# Patient Record
Sex: Male | Born: 1935 | Race: White | Hispanic: No | Marital: Married | Smoking: Never smoker
Health system: Southern US, Community
[De-identification: ages and names within clinical notes are randomized; demographics above are authoritative.]

## PROBLEM LIST (undated history)

## (undated) DIAGNOSIS — I480 Paroxysmal atrial fibrillation: Secondary | ICD-10-CM

## (undated) DIAGNOSIS — J42 Unspecified chronic bronchitis: Secondary | ICD-10-CM

## (undated) DIAGNOSIS — I80209 Phlebitis and thrombophlebitis of unspecified deep vessels of unspecified lower extremity: Secondary | ICD-10-CM

## (undated) DIAGNOSIS — I10 Essential (primary) hypertension: Secondary | ICD-10-CM

## (undated) DIAGNOSIS — J189 Pneumonia, unspecified organism: Secondary | ICD-10-CM

## (undated) DIAGNOSIS — I509 Heart failure, unspecified: Secondary | ICD-10-CM

## (undated) DIAGNOSIS — J339 Nasal polyp, unspecified: Secondary | ICD-10-CM

## (undated) DIAGNOSIS — N183 Chronic kidney disease, stage 3 unspecified: Secondary | ICD-10-CM

## (undated) DIAGNOSIS — J449 Chronic obstructive pulmonary disease, unspecified: Secondary | ICD-10-CM

## (undated) DIAGNOSIS — I2510 Atherosclerotic heart disease of native coronary artery without angina pectoris: Secondary | ICD-10-CM

## (undated) DIAGNOSIS — N400 Enlarged prostate without lower urinary tract symptoms: Secondary | ICD-10-CM

## (undated) DIAGNOSIS — E119 Type 2 diabetes mellitus without complications: Secondary | ICD-10-CM

## (undated) DIAGNOSIS — C4441 Basal cell carcinoma of skin of scalp and neck: Secondary | ICD-10-CM

## (undated) DIAGNOSIS — E7800 Pure hypercholesterolemia, unspecified: Secondary | ICD-10-CM

## (undated) DIAGNOSIS — Z950 Presence of cardiac pacemaker: Secondary | ICD-10-CM

## (undated) DIAGNOSIS — I2699 Other pulmonary embolism without acute cor pulmonale: Secondary | ICD-10-CM

## (undated) DIAGNOSIS — I495 Sick sinus syndrome: Secondary | ICD-10-CM

## (undated) HISTORY — PX: INSERT / REPLACE / REMOVE PACEMAKER: SUR710

## (undated) HISTORY — PX: CATARACT EXTRACTION W/ INTRAOCULAR LENS  IMPLANT, BILATERAL: SHX1307

## (undated) HISTORY — PX: BASAL CELL CARCINOMA EXCISION: SHX1214

## (undated) HISTORY — DX: Other pulmonary embolism without acute cor pulmonale: I26.99

## (undated) HISTORY — DX: Sick sinus syndrome: I49.5

## (undated) HISTORY — DX: Essential (primary) hypertension: I10

## (undated) HISTORY — DX: Nasal polyp, unspecified: J33.9

## (undated) HISTORY — DX: Phlebitis and thrombophlebitis of unspecified deep vessels of unspecified lower extremity: I80.209

## (undated) HISTORY — DX: Benign prostatic hyperplasia without lower urinary tract symptoms: N40.0

## (undated) HISTORY — DX: Atherosclerotic heart disease of native coronary artery without angina pectoris: I25.10

## (undated) HISTORY — PX: NASAL POLYP SURGERY: SHX186

---

## 1997-09-01 ENCOUNTER — Emergency Department (HOSPITAL_COMMUNITY): Admission: EM | Admit: 1997-09-01 | Discharge: 1997-09-01 | Payer: Self-pay | Admitting: Emergency Medicine

## 1997-09-03 ENCOUNTER — Inpatient Hospital Stay (HOSPITAL_COMMUNITY): Admission: EM | Admit: 1997-09-03 | Discharge: 1997-09-08 | Payer: Self-pay | Admitting: Internal Medicine

## 1997-10-19 ENCOUNTER — Inpatient Hospital Stay (HOSPITAL_COMMUNITY): Admission: EM | Admit: 1997-10-19 | Discharge: 1997-10-22 | Payer: Self-pay | Admitting: Emergency Medicine

## 2000-01-29 ENCOUNTER — Encounter: Payer: Self-pay | Admitting: Emergency Medicine

## 2000-01-29 ENCOUNTER — Inpatient Hospital Stay (HOSPITAL_COMMUNITY): Admission: EM | Admit: 2000-01-29 | Discharge: 2000-01-31 | Payer: Self-pay | Admitting: Emergency Medicine

## 2000-05-13 ENCOUNTER — Encounter: Payer: Self-pay | Admitting: Emergency Medicine

## 2000-05-13 ENCOUNTER — Inpatient Hospital Stay (HOSPITAL_COMMUNITY): Admission: EM | Admit: 2000-05-13 | Discharge: 2000-05-16 | Payer: Self-pay | Admitting: Emergency Medicine

## 2000-09-19 ENCOUNTER — Emergency Department (HOSPITAL_COMMUNITY): Admission: EM | Admit: 2000-09-19 | Discharge: 2000-09-19 | Payer: Self-pay

## 2001-08-03 ENCOUNTER — Encounter: Payer: Self-pay | Admitting: Emergency Medicine

## 2001-08-03 ENCOUNTER — Inpatient Hospital Stay (HOSPITAL_COMMUNITY): Admission: EM | Admit: 2001-08-03 | Discharge: 2001-08-05 | Payer: Self-pay | Admitting: Emergency Medicine

## 2002-03-06 ENCOUNTER — Emergency Department (HOSPITAL_COMMUNITY): Admission: EM | Admit: 2002-03-06 | Discharge: 2002-03-06 | Payer: Self-pay | Admitting: Emergency Medicine

## 2002-06-28 ENCOUNTER — Emergency Department (HOSPITAL_COMMUNITY): Admission: EM | Admit: 2002-06-28 | Discharge: 2002-06-29 | Payer: Self-pay

## 2002-06-28 ENCOUNTER — Encounter: Payer: Self-pay | Admitting: Emergency Medicine

## 2002-07-28 ENCOUNTER — Emergency Department (HOSPITAL_COMMUNITY): Admission: EM | Admit: 2002-07-28 | Discharge: 2002-07-29 | Payer: Self-pay | Admitting: Emergency Medicine

## 2002-07-28 ENCOUNTER — Encounter: Payer: Self-pay | Admitting: Emergency Medicine

## 2002-12-17 ENCOUNTER — Encounter: Payer: Self-pay | Admitting: Cardiology

## 2002-12-17 ENCOUNTER — Ambulatory Visit (HOSPITAL_COMMUNITY): Admission: RE | Admit: 2002-12-17 | Discharge: 2002-12-18 | Payer: Self-pay | Admitting: Cardiology

## 2003-09-10 ENCOUNTER — Emergency Department (HOSPITAL_COMMUNITY): Admission: EM | Admit: 2003-09-10 | Discharge: 2003-09-10 | Payer: Self-pay | Admitting: Emergency Medicine

## 2003-12-14 DIAGNOSIS — I2699 Other pulmonary embolism without acute cor pulmonale: Secondary | ICD-10-CM

## 2003-12-14 HISTORY — DX: Other pulmonary embolism without acute cor pulmonale: I26.99

## 2003-12-25 ENCOUNTER — Encounter: Admission: RE | Admit: 2003-12-25 | Discharge: 2003-12-25 | Payer: Self-pay | Admitting: *Deleted

## 2003-12-25 ENCOUNTER — Inpatient Hospital Stay (HOSPITAL_COMMUNITY): Admission: AD | Admit: 2003-12-25 | Discharge: 2003-12-30 | Payer: Self-pay | Admitting: *Deleted

## 2004-03-18 ENCOUNTER — Ambulatory Visit: Payer: Self-pay | Admitting: Cardiovascular Disease

## 2004-04-11 ENCOUNTER — Ambulatory Visit: Payer: Self-pay | Admitting: Internal Medicine

## 2004-05-03 ENCOUNTER — Ambulatory Visit: Payer: Self-pay | Admitting: Internal Medicine

## 2004-05-04 ENCOUNTER — Ambulatory Visit: Payer: Self-pay | Admitting: *Deleted

## 2004-05-10 ENCOUNTER — Ambulatory Visit: Payer: Self-pay | Admitting: Cardiology

## 2004-05-19 ENCOUNTER — Ambulatory Visit: Payer: Self-pay | Admitting: Cardiology

## 2004-06-07 ENCOUNTER — Encounter: Admission: RE | Admit: 2004-06-07 | Discharge: 2004-06-07 | Payer: Self-pay | Admitting: Otolaryngology

## 2004-06-17 ENCOUNTER — Ambulatory Visit (HOSPITAL_COMMUNITY): Admission: RE | Admit: 2004-06-17 | Discharge: 2004-06-17 | Payer: Self-pay | Admitting: Otolaryngology

## 2004-06-23 ENCOUNTER — Ambulatory Visit: Payer: Self-pay | Admitting: Cardiology

## 2004-07-21 ENCOUNTER — Ambulatory Visit: Payer: Self-pay | Admitting: *Deleted

## 2004-07-21 ENCOUNTER — Ambulatory Visit: Payer: Self-pay | Admitting: Internal Medicine

## 2004-08-18 ENCOUNTER — Ambulatory Visit: Payer: Self-pay | Admitting: Internal Medicine

## 2004-09-01 ENCOUNTER — Ambulatory Visit: Payer: Self-pay | Admitting: *Deleted

## 2004-09-15 ENCOUNTER — Ambulatory Visit: Payer: Self-pay | Admitting: Internal Medicine

## 2004-10-13 ENCOUNTER — Ambulatory Visit: Payer: Self-pay | Admitting: Internal Medicine

## 2004-11-10 ENCOUNTER — Ambulatory Visit: Payer: Self-pay | Admitting: Cardiology

## 2004-12-07 ENCOUNTER — Ambulatory Visit: Payer: Self-pay | Admitting: *Deleted

## 2004-12-07 ENCOUNTER — Ambulatory Visit: Payer: Self-pay | Admitting: Cardiology

## 2004-12-15 ENCOUNTER — Ambulatory Visit: Payer: Self-pay | Admitting: Internal Medicine

## 2004-12-20 ENCOUNTER — Ambulatory Visit: Payer: Self-pay | Admitting: Cardiology

## 2005-01-03 ENCOUNTER — Ambulatory Visit: Payer: Self-pay | Admitting: Cardiology

## 2005-01-24 ENCOUNTER — Emergency Department (HOSPITAL_COMMUNITY): Admission: EM | Admit: 2005-01-24 | Discharge: 2005-01-24 | Payer: Self-pay | Admitting: Emergency Medicine

## 2005-02-07 ENCOUNTER — Ambulatory Visit: Payer: Self-pay | Admitting: *Deleted

## 2005-03-07 ENCOUNTER — Ambulatory Visit: Payer: Self-pay | Admitting: Internal Medicine

## 2005-04-04 ENCOUNTER — Ambulatory Visit: Payer: Self-pay | Admitting: Cardiology

## 2005-06-13 ENCOUNTER — Ambulatory Visit: Payer: Self-pay | Admitting: Cardiology

## 2005-07-04 ENCOUNTER — Ambulatory Visit: Payer: Self-pay | Admitting: Cardiovascular Disease

## 2005-08-01 ENCOUNTER — Ambulatory Visit: Payer: Self-pay | Admitting: Cardiology

## 2005-08-11 ENCOUNTER — Ambulatory Visit: Payer: Self-pay | Admitting: Internal Medicine

## 2005-08-29 ENCOUNTER — Ambulatory Visit: Payer: Self-pay | Admitting: Cardiology

## 2005-09-12 ENCOUNTER — Ambulatory Visit: Payer: Self-pay | Admitting: Cardiology

## 2005-09-30 ENCOUNTER — Encounter: Admission: RE | Admit: 2005-09-30 | Discharge: 2005-09-30 | Payer: Self-pay

## 2005-10-31 ENCOUNTER — Ambulatory Visit: Payer: Self-pay | Admitting: Cardiology

## 2005-11-08 ENCOUNTER — Ambulatory Visit: Payer: Self-pay | Admitting: *Deleted

## 2005-12-05 ENCOUNTER — Ambulatory Visit: Payer: Self-pay | Admitting: Cardiology

## 2005-12-18 ENCOUNTER — Ambulatory Visit: Payer: Self-pay

## 2005-12-18 ENCOUNTER — Ambulatory Visit: Payer: Self-pay | Admitting: *Deleted

## 2006-01-08 ENCOUNTER — Ambulatory Visit: Payer: Self-pay | Admitting: Cardiology

## 2006-01-31 ENCOUNTER — Inpatient Hospital Stay (HOSPITAL_COMMUNITY): Admission: EM | Admit: 2006-01-31 | Discharge: 2006-02-05 | Payer: Self-pay | Admitting: Emergency Medicine

## 2006-01-31 ENCOUNTER — Encounter: Payer: Self-pay | Admitting: Cardiology

## 2006-01-31 ENCOUNTER — Ambulatory Visit: Payer: Self-pay | Admitting: *Deleted

## 2006-02-12 ENCOUNTER — Ambulatory Visit: Payer: Self-pay | Admitting: *Deleted

## 2006-02-15 ENCOUNTER — Ambulatory Visit: Payer: Self-pay | Admitting: Internal Medicine

## 2006-04-05 ENCOUNTER — Ambulatory Visit: Payer: Self-pay | Admitting: Cardiology

## 2006-04-05 ENCOUNTER — Inpatient Hospital Stay (HOSPITAL_COMMUNITY): Admission: EM | Admit: 2006-04-05 | Discharge: 2006-04-13 | Payer: Self-pay | Admitting: Emergency Medicine

## 2006-04-16 ENCOUNTER — Ambulatory Visit: Payer: Self-pay | Admitting: Internal Medicine

## 2006-04-23 ENCOUNTER — Ambulatory Visit: Payer: Self-pay | Admitting: Internal Medicine

## 2006-04-23 ENCOUNTER — Inpatient Hospital Stay (HOSPITAL_COMMUNITY): Admission: EM | Admit: 2006-04-23 | Discharge: 2006-04-27 | Payer: Self-pay | Admitting: Emergency Medicine

## 2006-05-15 HISTORY — PX: CORONARY ANGIOPLASTY WITH STENT PLACEMENT: SHX49

## 2006-05-17 ENCOUNTER — Ambulatory Visit: Payer: Self-pay | Admitting: Internal Medicine

## 2006-05-21 ENCOUNTER — Ambulatory Visit: Payer: Self-pay

## 2006-06-27 ENCOUNTER — Ambulatory Visit: Payer: Self-pay | Admitting: Internal Medicine

## 2006-07-17 ENCOUNTER — Ambulatory Visit: Payer: Self-pay | Admitting: Internal Medicine

## 2006-08-01 ENCOUNTER — Inpatient Hospital Stay (HOSPITAL_COMMUNITY): Admission: EM | Admit: 2006-08-01 | Discharge: 2006-08-04 | Payer: Self-pay | Admitting: Emergency Medicine

## 2006-08-01 ENCOUNTER — Ambulatory Visit: Payer: Self-pay | Admitting: Cardiology

## 2006-08-02 ENCOUNTER — Encounter: Payer: Self-pay | Admitting: Cardiology

## 2006-08-21 ENCOUNTER — Ambulatory Visit: Payer: Self-pay | Admitting: Internal Medicine

## 2006-08-21 LAB — CONVERTED CEMR LAB
Chloride: 104 meq/L (ref 96–112)
GFR calc non Af Amer: 48 mL/min
Glucose, Bld: 200 mg/dL — ABNORMAL HIGH (ref 70–99)
Sodium: 144 meq/L (ref 135–145)

## 2006-09-24 ENCOUNTER — Ambulatory Visit: Payer: Self-pay | Admitting: Cardiology

## 2006-12-26 ENCOUNTER — Ambulatory Visit: Payer: Self-pay | Admitting: Internal Medicine

## 2007-03-22 DIAGNOSIS — I2510 Atherosclerotic heart disease of native coronary artery without angina pectoris: Secondary | ICD-10-CM | POA: Insufficient documentation

## 2007-03-22 DIAGNOSIS — E1129 Type 2 diabetes mellitus with other diabetic kidney complication: Secondary | ICD-10-CM

## 2007-03-22 DIAGNOSIS — I10 Essential (primary) hypertension: Secondary | ICD-10-CM | POA: Insufficient documentation

## 2007-03-22 DIAGNOSIS — I82409 Acute embolism and thrombosis of unspecified deep veins of unspecified lower extremity: Secondary | ICD-10-CM | POA: Insufficient documentation

## 2007-03-22 DIAGNOSIS — J330 Polyp of nasal cavity: Secondary | ICD-10-CM

## 2007-03-22 DIAGNOSIS — J45909 Unspecified asthma, uncomplicated: Secondary | ICD-10-CM

## 2007-03-22 DIAGNOSIS — J309 Allergic rhinitis, unspecified: Secondary | ICD-10-CM | POA: Insufficient documentation

## 2007-03-28 ENCOUNTER — Ambulatory Visit: Payer: Self-pay | Admitting: Cardiology

## 2007-04-23 ENCOUNTER — Ambulatory Visit: Payer: Self-pay | Admitting: Internal Medicine

## 2007-05-01 ENCOUNTER — Ambulatory Visit: Payer: Self-pay | Admitting: Internal Medicine

## 2007-05-01 LAB — CONVERTED CEMR LAB
Alkaline Phosphatase: 62 units/L (ref 39–117)
TSH: 0.86 microintl units/mL (ref 0.35–5.50)
Total Protein: 7.1 g/dL (ref 6.0–8.3)

## 2007-10-22 ENCOUNTER — Ambulatory Visit: Payer: Self-pay | Admitting: Internal Medicine

## 2007-10-23 ENCOUNTER — Ambulatory Visit: Payer: Self-pay | Admitting: Cardiology

## 2007-11-02 DIAGNOSIS — I2699 Other pulmonary embolism without acute cor pulmonale: Secondary | ICD-10-CM

## 2007-11-21 ENCOUNTER — Ambulatory Visit: Payer: Self-pay | Admitting: Cardiology

## 2007-11-21 LAB — CONVERTED CEMR LAB
ALT: 25 units/L (ref 0–53)
Bilirubin, Direct: 0.1 mg/dL (ref 0.0–0.3)
Total Bilirubin: 0.8 mg/dL (ref 0.3–1.2)

## 2008-03-09 ENCOUNTER — Encounter: Payer: Self-pay | Admitting: Internal Medicine

## 2008-05-01 ENCOUNTER — Ambulatory Visit: Payer: Self-pay | Admitting: Internal Medicine

## 2008-05-18 ENCOUNTER — Encounter: Payer: Self-pay | Admitting: Internal Medicine

## 2008-05-25 ENCOUNTER — Ambulatory Visit: Payer: Self-pay | Admitting: Internal Medicine

## 2008-05-25 DIAGNOSIS — G4700 Insomnia, unspecified: Secondary | ICD-10-CM | POA: Insufficient documentation

## 2008-06-02 ENCOUNTER — Encounter: Payer: Self-pay | Admitting: Internal Medicine

## 2008-06-02 ENCOUNTER — Ambulatory Visit: Admission: RE | Admit: 2008-06-02 | Discharge: 2008-06-02 | Payer: Self-pay | Admitting: Internal Medicine

## 2008-07-02 ENCOUNTER — Ambulatory Visit: Payer: Self-pay | Admitting: Internal Medicine

## 2008-07-22 DIAGNOSIS — R42 Dizziness and giddiness: Secondary | ICD-10-CM

## 2008-07-24 ENCOUNTER — Observation Stay (HOSPITAL_COMMUNITY): Admission: EM | Admit: 2008-07-24 | Discharge: 2008-07-25 | Payer: Self-pay | Admitting: Emergency Medicine

## 2008-07-24 ENCOUNTER — Ambulatory Visit: Payer: Self-pay | Admitting: Surgery

## 2008-07-24 ENCOUNTER — Ambulatory Visit: Payer: Self-pay | Admitting: Internal Medicine

## 2008-07-24 ENCOUNTER — Encounter (INDEPENDENT_AMBULATORY_CARE_PROVIDER_SITE_OTHER): Payer: Self-pay | Admitting: Emergency Medicine

## 2008-07-24 ENCOUNTER — Encounter: Payer: Self-pay | Admitting: Internal Medicine

## 2008-07-24 DIAGNOSIS — N259 Disorder resulting from impaired renal tubular function, unspecified: Secondary | ICD-10-CM | POA: Insufficient documentation

## 2008-09-05 ENCOUNTER — Emergency Department (HOSPITAL_COMMUNITY): Admission: EM | Admit: 2008-09-05 | Discharge: 2008-09-05 | Payer: Self-pay | Admitting: Emergency Medicine

## 2008-09-05 ENCOUNTER — Encounter (INDEPENDENT_AMBULATORY_CARE_PROVIDER_SITE_OTHER): Payer: Self-pay | Admitting: *Deleted

## 2008-10-13 ENCOUNTER — Ambulatory Visit (HOSPITAL_COMMUNITY): Admission: RE | Admit: 2008-10-13 | Discharge: 2008-10-13 | Payer: Self-pay | Admitting: Internal Medicine

## 2008-10-13 ENCOUNTER — Ambulatory Visit: Payer: Self-pay | Admitting: Internal Medicine

## 2008-10-13 DIAGNOSIS — R0609 Other forms of dyspnea: Secondary | ICD-10-CM | POA: Insufficient documentation

## 2008-10-13 DIAGNOSIS — R0989 Other specified symptoms and signs involving the circulatory and respiratory systems: Secondary | ICD-10-CM

## 2008-10-13 DIAGNOSIS — I4891 Unspecified atrial fibrillation: Secondary | ICD-10-CM

## 2008-10-14 ENCOUNTER — Ambulatory Visit: Payer: Self-pay

## 2008-10-14 ENCOUNTER — Encounter: Payer: Self-pay | Admitting: Internal Medicine

## 2008-10-16 ENCOUNTER — Telehealth: Payer: Self-pay | Admitting: Internal Medicine

## 2008-10-19 ENCOUNTER — Ambulatory Visit: Payer: Self-pay | Admitting: Internal Medicine

## 2008-10-21 ENCOUNTER — Telehealth (INDEPENDENT_AMBULATORY_CARE_PROVIDER_SITE_OTHER): Payer: Self-pay | Admitting: *Deleted

## 2008-10-22 ENCOUNTER — Ambulatory Visit: Payer: Self-pay

## 2008-10-22 ENCOUNTER — Encounter: Payer: Self-pay | Admitting: Internal Medicine

## 2008-10-28 ENCOUNTER — Telehealth (INDEPENDENT_AMBULATORY_CARE_PROVIDER_SITE_OTHER): Payer: Self-pay | Admitting: *Deleted

## 2008-11-11 ENCOUNTER — Telehealth: Payer: Self-pay | Admitting: Internal Medicine

## 2008-12-28 ENCOUNTER — Ambulatory Visit: Payer: Self-pay | Admitting: Internal Medicine

## 2008-12-30 ENCOUNTER — Encounter: Payer: Self-pay | Admitting: Internal Medicine

## 2009-03-15 ENCOUNTER — Encounter: Payer: Self-pay | Admitting: Internal Medicine

## 2009-06-08 ENCOUNTER — Inpatient Hospital Stay (HOSPITAL_COMMUNITY): Admission: EM | Admit: 2009-06-08 | Discharge: 2009-06-25 | Payer: Self-pay | Admitting: Emergency Medicine

## 2009-06-08 ENCOUNTER — Ambulatory Visit: Payer: Self-pay | Admitting: Internal Medicine

## 2009-06-11 ENCOUNTER — Ambulatory Visit: Payer: Self-pay | Admitting: Vascular Surgery

## 2009-06-11 ENCOUNTER — Encounter: Payer: Self-pay | Admitting: Internal Medicine

## 2009-06-12 ENCOUNTER — Encounter (INDEPENDENT_AMBULATORY_CARE_PROVIDER_SITE_OTHER): Payer: Self-pay | Admitting: Pulmonary Disease

## 2009-06-14 ENCOUNTER — Encounter: Payer: Self-pay | Admitting: Internal Medicine

## 2009-06-14 ENCOUNTER — Telehealth (INDEPENDENT_AMBULATORY_CARE_PROVIDER_SITE_OTHER): Payer: Self-pay | Admitting: *Deleted

## 2009-06-15 ENCOUNTER — Telehealth: Payer: Self-pay | Admitting: Internal Medicine

## 2009-06-18 ENCOUNTER — Telehealth: Payer: Self-pay | Admitting: Internal Medicine

## 2009-06-22 ENCOUNTER — Telehealth: Payer: Self-pay | Admitting: Internal Medicine

## 2009-06-26 ENCOUNTER — Inpatient Hospital Stay (HOSPITAL_COMMUNITY): Admission: EM | Admit: 2009-06-26 | Discharge: 2009-07-02 | Payer: Self-pay | Admitting: Emergency Medicine

## 2009-07-05 ENCOUNTER — Encounter: Payer: Self-pay | Admitting: Internal Medicine

## 2009-07-05 LAB — CONVERTED CEMR LAB: POC INR: 0.8

## 2009-07-06 ENCOUNTER — Telehealth (INDEPENDENT_AMBULATORY_CARE_PROVIDER_SITE_OTHER): Payer: Self-pay | Admitting: *Deleted

## 2009-07-07 ENCOUNTER — Telehealth: Payer: Self-pay | Admitting: Internal Medicine

## 2009-07-07 ENCOUNTER — Encounter: Payer: Self-pay | Admitting: Internal Medicine

## 2009-07-07 LAB — CONVERTED CEMR LAB: POC INR: 1.2

## 2009-07-09 ENCOUNTER — Encounter: Payer: Self-pay | Admitting: Cardiology

## 2009-07-09 ENCOUNTER — Ambulatory Visit: Payer: Self-pay | Admitting: Internal Medicine

## 2009-07-09 ENCOUNTER — Telehealth: Payer: Self-pay | Admitting: Internal Medicine

## 2009-07-09 DIAGNOSIS — R609 Edema, unspecified: Secondary | ICD-10-CM | POA: Insufficient documentation

## 2009-07-09 LAB — CONVERTED CEMR LAB: POC INR: 1.9

## 2009-07-12 ENCOUNTER — Encounter: Payer: Self-pay | Admitting: Cardiology

## 2009-07-12 LAB — CONVERTED CEMR LAB: POC INR: 2.5

## 2009-07-15 ENCOUNTER — Encounter: Payer: Self-pay | Admitting: Internal Medicine

## 2009-07-16 ENCOUNTER — Encounter: Payer: Self-pay | Admitting: Cardiology

## 2009-07-18 ENCOUNTER — Encounter: Payer: Self-pay | Admitting: Internal Medicine

## 2009-07-20 ENCOUNTER — Encounter: Payer: Self-pay | Admitting: Cardiology

## 2009-07-20 LAB — CONVERTED CEMR LAB: POC INR: 6.2

## 2009-07-23 ENCOUNTER — Encounter: Payer: Self-pay | Admitting: Cardiology

## 2009-07-23 ENCOUNTER — Encounter: Payer: Self-pay | Admitting: Internal Medicine

## 2009-07-23 ENCOUNTER — Encounter (INDEPENDENT_AMBULATORY_CARE_PROVIDER_SITE_OTHER): Payer: Self-pay | Admitting: Cardiology

## 2009-07-23 LAB — CONVERTED CEMR LAB: POC INR: 3.6

## 2009-07-27 ENCOUNTER — Encounter: Payer: Self-pay | Admitting: Cardiology

## 2009-07-27 LAB — CONVERTED CEMR LAB
POC INR: 1.9
Prothrombin Time: 16.9 s

## 2009-07-30 ENCOUNTER — Encounter: Payer: Self-pay | Admitting: Cardiovascular Disease

## 2009-07-30 LAB — CONVERTED CEMR LAB
POC INR: 1.7
Prothrombin Time: 16.1 s

## 2009-08-04 ENCOUNTER — Encounter: Payer: Self-pay | Admitting: Internal Medicine

## 2009-08-04 ENCOUNTER — Telehealth: Payer: Self-pay | Admitting: Internal Medicine

## 2009-08-04 ENCOUNTER — Encounter (INDEPENDENT_AMBULATORY_CARE_PROVIDER_SITE_OTHER): Payer: Self-pay | Admitting: Cardiology

## 2009-08-04 LAB — CONVERTED CEMR LAB: Prothrombin Time: 15.3 s

## 2009-08-06 ENCOUNTER — Encounter: Payer: Self-pay | Admitting: Internal Medicine

## 2009-08-11 ENCOUNTER — Ambulatory Visit: Payer: Self-pay | Admitting: Cardiovascular Disease

## 2009-08-13 ENCOUNTER — Encounter: Payer: Self-pay | Admitting: Internal Medicine

## 2009-08-14 ENCOUNTER — Ambulatory Visit: Payer: Self-pay | Admitting: Internal Medicine

## 2009-08-25 ENCOUNTER — Encounter: Payer: Self-pay | Admitting: Internal Medicine

## 2009-08-25 ENCOUNTER — Ambulatory Visit: Payer: Self-pay | Admitting: Internal Medicine

## 2009-09-17 ENCOUNTER — Ambulatory Visit: Payer: Self-pay | Admitting: Cardiology

## 2009-09-17 LAB — CONVERTED CEMR LAB: POC INR: 2.4

## 2009-10-03 ENCOUNTER — Encounter: Payer: Self-pay | Admitting: Internal Medicine

## 2009-10-03 ENCOUNTER — Emergency Department (HOSPITAL_COMMUNITY): Admission: EM | Admit: 2009-10-03 | Discharge: 2009-10-03 | Payer: Self-pay | Admitting: Emergency Medicine

## 2009-10-04 ENCOUNTER — Telehealth: Payer: Self-pay | Admitting: Internal Medicine

## 2009-10-15 ENCOUNTER — Ambulatory Visit: Payer: Self-pay | Admitting: Cardiovascular Disease

## 2009-10-21 ENCOUNTER — Ambulatory Visit: Payer: Self-pay | Admitting: Internal Medicine

## 2009-11-08 ENCOUNTER — Ambulatory Visit: Payer: Self-pay | Admitting: Cardiology

## 2009-11-08 ENCOUNTER — Ambulatory Visit: Payer: Self-pay | Admitting: Internal Medicine

## 2009-11-29 ENCOUNTER — Ambulatory Visit: Payer: Self-pay | Admitting: Cardiology

## 2009-12-27 ENCOUNTER — Ambulatory Visit: Payer: Self-pay | Admitting: Cardiology

## 2009-12-27 LAB — CONVERTED CEMR LAB: POC INR: 2.1

## 2010-01-12 ENCOUNTER — Encounter: Payer: Self-pay | Admitting: Internal Medicine

## 2010-01-21 ENCOUNTER — Ambulatory Visit: Payer: Self-pay | Admitting: Internal Medicine

## 2010-02-24 ENCOUNTER — Inpatient Hospital Stay (HOSPITAL_COMMUNITY): Admission: EM | Admit: 2010-02-24 | Discharge: 2010-03-01 | Payer: Self-pay | Admitting: Emergency Medicine

## 2010-03-23 ENCOUNTER — Telehealth (INDEPENDENT_AMBULATORY_CARE_PROVIDER_SITE_OTHER): Payer: Self-pay | Admitting: *Deleted

## 2010-03-24 ENCOUNTER — Encounter: Payer: Self-pay | Admitting: Internal Medicine

## 2010-04-05 ENCOUNTER — Ambulatory Visit: Payer: Self-pay | Admitting: Cardiovascular Disease

## 2010-05-03 ENCOUNTER — Ambulatory Visit: Payer: Self-pay | Admitting: Cardiology

## 2010-05-12 ENCOUNTER — Encounter: Payer: Self-pay | Admitting: Internal Medicine

## 2010-05-12 ENCOUNTER — Ambulatory Visit: Payer: Self-pay

## 2010-05-31 ENCOUNTER — Ambulatory Visit: Admit: 2010-05-31 | Payer: Self-pay

## 2010-06-05 ENCOUNTER — Encounter: Payer: Self-pay | Admitting: Internal Medicine

## 2010-06-06 ENCOUNTER — Encounter: Payer: Self-pay | Admitting: Internal Medicine

## 2010-06-10 ENCOUNTER — Ambulatory Visit: Admit: 2010-06-10 | Payer: Self-pay

## 2010-06-12 LAB — CONVERTED CEMR LAB
ALT: 30 units/L (ref 0–53)
Alkaline Phosphatase: 77 units/L (ref 39–117)
BUN: 28 mg/dL — ABNORMAL HIGH (ref 6–23)
Bilirubin, Direct: 0.2 mg/dL (ref 0.0–0.3)
Calcium: 9.3 mg/dL (ref 8.4–10.5)
Chloride: 105 meq/L (ref 96–112)
Creatinine, Ser: 1.8 mg/dL — ABNORMAL HIGH (ref 0.4–1.5)
GFR calc non Af Amer: 38.22 mL/min (ref >60)
Glucose, Bld: 121 mg/dL — ABNORMAL HIGH (ref 70–99)
Total Bilirubin: 0.5 mg/dL (ref 0.3–1.2)

## 2010-06-14 NOTE — Miscellaneous (Signed)
Summary: Plan of Care & Treatment/[facility]  Plan of Care & Treatment/[facility]   Imported By: Sherian Rein 08/19/2009 13:50:38  _____________________________________________________________________  External Attachment:    Type:   Image     Comment:   External Document

## 2010-06-14 NOTE — Progress Notes (Signed)
Summary: appt & med  Phone Note Call from Patient Call back at Home Phone [phone]   Caller: [name] @ [facility] Call For: young Summary of Call: Continuation of message.  Pt would like to know if he can be seen today.  [name] says he would come in today.  Also, whwere did they send the Silenor to?  Call his wife at the home number and speak to her. Initial call taken by: Eugene Gavia,  July 09, 2009 1:21 PM  Follow-up for Phone Call        Per CDY-ok to come in today at 430pm. Rx was not sent; spouse is aware and will have pt in today to see CDY and wait for sending RX after seeing CDY. Reynaldo Minium CMA  July 09, 2009 1:31 PM

## 2010-06-14 NOTE — Progress Notes (Signed)
Summary: SILENOR REFILL  Phone Note From Other Clinic   Caller: [facility] -stephanie [phone] Call For: Quantavius Humm Summary of Call: NEEDS SILENOR 6 MG [facility] IN TO [facility] [address] [facility] [address] Initial call taken by: Lacinda Axon,  August 04, 2009 2:58 PM  Follow-up for Phone Call        according to phone note from 2/23 and 07/09/09 CY ok silenor rx, but then it was never called into pharmacy according to notes, so I sent in rx. Judeth Cornfield is aware. Carron Curie CMA  August 04, 2009 3:05 PM     Prescriptions: SILENOR 6 MG TABS (DOXEPIN HCL) 1 for sleep if needed  #30 x 2   Entered by:   Carron Curie CMA   Authorized by:   [name] MD   Signed by:   Carron Curie CMA on 08/04/2009   Method used:   [facility]        [facility]  [address] [phone]       [address]       [place], [place]  [id number]       Ph: [phone] or [phone]       Fax: [phone]   RxID:   [id number]

## 2010-06-14 NOTE — Medication Information (Signed)
Summary: ccr/jss  Anticoagulant Therapy  Managed by: Bethena Midget, RN, BSN Referring MD: Odessa Fleming PCP: [facility] [address] Supervising MD: Jens Som MD, Arlys John Indication 1: DVT Indication 2: Pulmonary Embolism Lab Used: [facility] [place] Site: [address] INR POC 2.4 INR RANGE 2.0-3.0  Dietary changes: yes       Details: Appetite poor  Health status changes: no    Bleeding/hemorrhagic complications: no    Recent/future hospitalizations: no    Any changes in medication regimen? no    Recent/future dental: no  Any missed doses?: no       Is patient compliant with meds? yes       Allergies: No Known Drug Allergies  Anticoagulation Management History:      The patient is taking warfarin and comes in today for a routine follow up visit.  Positive risk factors for bleeding include an age of 75 years or older and presence of serious comorbidities.  The bleeding index is 'intermediate risk'.  Positive CHADS2 values include History of HTN, Age > 59 years old, and History of Diabetes.  Anticoagulation responsible provider: Jens Som MD, Arlys John.  INR POC: 2.4.  Cuvette Lot#: [id number].  Exp: 10/2010.    Anticoagulation Management Assessment/Plan:      The target INR is 2.0-3.0.  The next INR is due 10/15/2009.  Anticoagulation instructions were given to spouse.  Results were reviewed/authorized by Bethena Midget, RN, BSN.  He was notified by Bethena Midget, RN, BSN.         Prior Anticoagulation Instructions: INR 1.8   Change dose to 2 tablets every day except 1 tablet on Sunday, Tuesday and Thursday   Current Anticoagulation Instructions: INR 2.4 Continue 2  pills everyday except on Tuesdays, Thursdays and Sundays take 1 pill. Recheck in 4 weeks.

## 2010-06-14 NOTE — Miscellaneous (Signed)
Summary: PT orders/[facility]  PT orders/[facility]   Imported By: Sherian Rein 08/19/2009 13:48:57  _____________________________________________________________________  External Attachment:    Type:   Image     Comment:   External Document

## 2010-06-14 NOTE — Progress Notes (Signed)
Summary: [facility] called re: pt's diet in hosp  Phone Note From Other Clinic   Caller: [facility] at [facility] Call For: [name] Summary of Call: nurse named Ta Ta-  states that pt passed swallow test. wants to know if pt can have a regular diet with thin liquids? call ta ta at [phone] Initial call taken by: Tivis Ringer, CNA,  June 18, 2009 12:47 PM  Follow-up for Phone Call        Per CDY-ok for 2000 cal ADA diet heart healthy; D/C TNA but keep picc open with saline. Will deal with med changes tomorrow when he rounds in the morning. Spoke with Ta Ta and she verbally expressed she understood the orders as well as she wrote the orders down while on the phone with me. Reynaldo Minium CMA  June 18, 2009 1:53 PM

## 2010-06-14 NOTE — Medication Information (Signed)
Summary: Coumadin Clinic  Anticoagulant Therapy  Managed by: Shelby Dubin, PharmD, BCPS, CPP Referring MD: Odessa Fleming PCP: [facility] [address] Supervising MD: Antoine Poche MD, Fayrene Fearing Indication 1: DVT Indication 2: Pulmonary Embolism Lab Used: [facility] [place] Site: [address] PT 23.0 INR POC 3.6 INR RANGE 2.0-3.0  Dietary changes: no    Health status changes: no    Bleeding/hemorrhagic complications: no    Recent/future hospitalizations: no    Any changes in medication regimen? no    Recent/future dental: no  Any missed doses?: no       Is patient compliant with meds? yes      Comments: called by  Judeth Cornfield, RN [facility]   Allergies (verified): No Known Drug Allergies  Anticoagulation Management History:      His anticoagulation is being managed by telephone today.  Positive risk factors for bleeding include an age of 62 years or older and presence of serious comorbidities.  The bleeding index is 'intermediate risk'.  Positive CHADS2 values include History of HTN, Age > 34 years old, and History of Diabetes.  Prothrombin time is 23.0.  Anticoagulation responsible provider: Antoine Poche MD, Fayrene Fearing.  INR POC: 3.6.    Anticoagulation Management Assessment/Plan:      The target INR is 2.0-3.0.  The next INR is due 07/27/2009.  Anticoagulation instructions were given to spouse.  Results were reviewed/authorized by Shelby Dubin, PharmD, BCPS, CPP.  He was notified by Shelby Dubin PharmD, BCPS, CPP.         Prior Anticoagulation Instructions: INR 6.20  Called spoke with pt's wife advised to hold coumadin x 3 days.  Recheck PT/INR on Friday 07/23/09.  Called spoke with Judeth Cornfield, [facility] team.  Advised to redraw PT/INR on 07/23/09.    Current Anticoagulation Instructions: Hold 1 day then 1 mg daily.  Orders to [place], RN to recheck on 3/15.

## 2010-06-14 NOTE — Progress Notes (Signed)
Summary: call son of pt  Phone Note Call from Patient Call back at [phone]   Caller: son-Gerald Call For: Maple Hudson Reason for Call: Talk to Doctor Summary of Call: CDY spoke to pt this morning at the hospital.  Due to pt's age, could CDY call Earvin Hansen and speak to him.  Pt was unable to remember what was told at meeting this morning w/CDY. Initial call taken by: Eugene Gavia,  June 15, 2009 1:22 PM  Follow-up for Phone Call        called, spoke with Earvin Hansen.  Per Earvin Hansen, he would like to talk with CY  about CY and pt's conversation this am in the hospital.  Will forward to CY.  Gweneth Dimitri RN  June 15, 2009 1:52 PM   Additional Follow-up for Phone Call Additional follow up Details #1::        Phone mail box wouldn't accept message. Try again later.  I reached son Dorene Sorrow later in afternoon at [phone] Additional Follow-up by: [name] MD,  June 15, 2009 1:55 PM

## 2010-06-14 NOTE — Assessment & Plan Note (Signed)
Summary: rov//lmr   Primary Provider/Referring Provider:  [facility] [address]  CC:  rov, was in hosp 10/03/09 and promised he would come for a f/u, pt states he does not need to be here but is following up on his word given to Dr. Rito Ehrlich that he would see Dr Maple Hudson, and pt states his breaqhting is fine and feels as though the advil cause him problems..  History of Present Illness: 12/28/08- Asthma, rhinitis.................Marland Kitchenwife here Says 2 years can't hear from  left ear and ringing- Dr Jenne Pane ENT could offer no help. Breathing worse x 2 weeks, especially yesterday. Air quality. Coughing yellow. Some vertigo and headache "cloudy". Deny fever, nausea.  July 09, 2009- Asthma, rhinitis, AF/ coumadin, DM, Renal insufficiency...........Marland Kitchenwife and son here He  had long hospital stay starting with bronchitis, CHF, AF, renal insuff. He had DVT of uncertain age, with hyposxia treated as DVT/ possible PE, started on coumadin. Went back into hospial with the hospitalists for CHF and renal insufficiency, discharged 1 week ago with no diuretic. Family claim no change in diet and no change in meds. They say they restarted his lasix at 40 mg daily. In last few days he has swollen enormously, Right arm is tender and HHN raised issue of antibiotic for possible infection from an IV. He says he is passing urine and that his breathing is pretty good. He asks about a sleeping pill which I declined as not safe for now.  October 21, 2009- Asthma/ COPD, rhinitis, AF/ coumaidin, DM, renal insufficiency.......Marland Kitchenwife here Micah Flesher to ER 5/21 with nasuea and dyspnea after taking an Advil.  CXR 10/02/09- Stable basoilar scarring, otw lungs clear. No evidence of amiodarone lung disease. He says beathing is "fine', not coughing or wheezing, not needing cough medicine. He has been using his nebulizer 2x/d for budesonide, and up to qid as needed for ipratropium. Med list is corrected to reflect that use.     Asthma History  Asthma Control Assessment:    Age range: 12+ years    Symptoms: 0-2 days/week    Nighttime Awakenings: 0-2/month    Interferes w/ normal activity: no limitations    SABA use (not for EIB): 0-2 days/week    Asthma Control Assessment: Well Controlled   Preventive Screening-Counseling & Management  Alcohol-Tobacco     Smoking Status: never  Current Medications (verified): 1)  Pulmicort 0.25 Mg/50ml  Susp (Budesonide (Inhalation)) .... Use Two Times A Day 2)  Metoprolol Succinate 25 Mg Xr24h-Tab (Metoprolol Succinate) .... Take One Tablet Two Times A Day 3)  Amiodarone Hcl 200 Mg  Tabs (Amiodarone Hcl) .... Take 1/2  Tablet Every Morning 4)  Lasix 40 Mg  Tabs (Furosemide) .... Take 1 Tablet By Mouth Once A Day 5)  Nitroglycerin 0.3 Mg  Subl (Nitroglycerin) .... As Needed 6)  Glipizide 5 Mg  Tabs (Glipizide) .... 2  Tablets Once Daily 7)  Lovastatin 40 Mg  Tabs (Lovastatin) .... Take 1 By Mouth Once Daily 8)  Promethazine-Codeine 6.25-10 Mg/58ml Syrp (Promethazine-Codeine) .[name].. 1 Tsp Four Times Daily As Needed 9)  Silenor 6 Mg Tabs (Doxepin Hcl) .[name].. 1 For Sleep If Needed 10)  Qc Daily Multivitamins/iron  Tabs (Multiple Vitamins-Iron) .... Take 1 By Mouth Once Daily 11)  Clonazepam 0.5 Mg Tabs (Clonazepam) .... Take 1 Tablet At Bedtime As Needed Sleep 12)  Coumadin 1 Mg Tabs (Warfarin Sodium) .... Once Daily  Allergies (verified): 1)  ! 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) DEEP VENOUS THROMBOPHLEBITIS (ICD-453.40) NASAL POLYP (ICD-471.0) ALLERGIC RHINITIS (ICD-477.9) ASTHMA (ICD-493.90) Renal insufficiency  Past Surgical History: Last updated: 05/25/2008 Cataracts/ lens impants nasal polypectomy Cardiac stent- eluting pacemaker implant  Family History: Last  updated: 10/13/2008 Negative FH of Diabetes, Hypertension, or Coronary Artery Disease  Social History: Last updated: 05/25/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    Vital Signs:  Patient profile:   77 year old male Height:      67 inches Weight:      154 pounds BMI:     24.21 O2 Sat:      96 % on Room air Pulse rate:   63 / minute BP sitting:   122 / 64  (left arm) Cuff size:   regular  Vitals Entered By: Reynaldo Minium CMA (October 21, 2009 10:25 AM)  O2 Sat at Rest %:  96% O2 Flow:  Room air CC: rov,was in hosp 10/03/09 and promised he would come for a f/u, pt states he does not need to be here but is following up on his word given to Dr. Rito Ehrlich that he would see Dr Maple Hudson, pt states his breaqhting is fine and feels as though the advil cause him problems.   Physical Exam  Additional Exam:  General: A/Ox3; pleasant and cooperative, NAD, elderly, weak, calm, room air sat 96% SKIN: no rash, lesions NODES: no lymphadenopathy HEENT: [place]/AT, EOM- WNL, Conjuctivae- clear, PERRLA, TM-WNL, Nose- clear, Throat- clear and wnl,  without hoarseness. NECK: Supple w/ fair ROM, JVD- trace, normal carotid impulses w/o bruits Thyroid-  CHEST:clear- no wheeze, minimal rales, unlabored HEART: Regular, no m/g/r heard, ABDOMEN: Soft and nl; ZOX:WRUE, nl pulses, trace edema NEURO: Grossly intact to observation      Impression & Recommendations:  Problem # 1:  ASTHMA (ICD-493.90) Much  improved control now. I added ipratropium to his med list, since he has been on it.  Problem # 2:  ATRIAL FIBRILLATION (ICD-427.31) sinus rhythm by exam  today. No amiodarone lung disease seen. His updated medication list for this problem includes:    Metoprolol Succinate 25 Mg Xr24h-tab (Metoprolol succinate) .[name]... Take one tablet two times a day    Amiodarone Hcl 200 Mg Tabs (Amiodarone hcl) .[name]... Take 1/2  tablet every morning    Coumadin 1 Mg Tabs (Warfarin sodium) ..... Once daily  Problem # 3:  ALLERGIC RHINITIS (ICD-477.9)  Adequate control  Orders: Est. Patient Level IV (45409)  Problem # 4:  INSOMNIA (ICD-780.52) He and his wife feel his sleepoing pills do well and he is sleeping better now.  His updated medication list for this problem includes:    Silenor 6 Mg Tabs (Doxepin hcl) .[name]... 1 for sleep if needed  Orders: Est. Patient Level IV (81191)  Medications Added to Medication List This Visit: 1)  Glipizide 5 Mg Tabs (Glipizide) .... 2  tablets once daily 2)  Coumadin 1 Mg Tabs (Warfarin sodium) .... Once daily 3)  Ipratropium Bromide 0.02 % Soln (  Ipratropium bromide) .[name].. 1 neb four times a day as needed  Patient Instructions: 1)  Please schedule a follow-up appointment in 4 months. 2)  I suggest you use the nebulizer: 3)    Pulmicort/ budesonide twice daily 4)    Ipratropium/ Atrovent up to 4 x daily if needed, but you can skip it when not needed. Prescriptions: IPRATROPIUM BROMIDE 0.02 % SOLN (IPRATROPIUM BROMIDE) 1 neb four times a day as needed  #360 x 3   Entered and Authorized by:   [name] MD   Signed by:   [name] MD on 10/21/2009   Method used:   Historical   RxID:   [id number]    Immunization History:  Influenza Immunization History:    Influenza:  fluvax 3+ (02/12/2009)  Pneumovax Immunization History:    Pneumovax:  pneumovax (02/13/2008)

## 2010-06-14 NOTE — Letter (Signed)
[facility]: [facility]   Imported By: Marylou Mccoy 10/20/2009 09:39:24  _____________________________________________________________________  External Attachment:    Type:   Image     Comment:   External Document

## 2010-06-14 NOTE — Medication Information (Signed)
Summary: rov/tm  Anticoagulant Therapy  Managed by: Eda Keys, PharmD Referring MD: Odessa Fleming PCP: [facility] [address] Supervising MD: Tenny Craw MD, Gunnar Fusi Indication 1: DVT Indication 2: Pulmonary Embolism Lab Used: [facility] [place] Site: [address] INR POC 2.1 INR RANGE 2.0-3.0  Dietary changes: no    Health status changes: no    Bleeding/hemorrhagic complications: no    Recent/future hospitalizations: no    Any changes in medication regimen? no    Recent/future dental: no  Any missed doses?: no       Is patient compliant with meds? yes       Allergies: 1)  ! Advil (Ibuprofen)  Anticoagulation Management History:      The patient is taking warfarin and comes in today for a routine follow up visit.  Positive risk factors for bleeding include an age of 29 years or older and presence of serious comorbidities.  The bleeding index is 'intermediate risk'.  Positive CHADS2 values include History of HTN, Age > 68 years old, and History of Diabetes.  Anticoagulation responsible provider: Tenny Craw MD, Gunnar Fusi.  INR POC: 2.1.  Cuvette Lot#: [id number].  Exp: 03/2011.    Anticoagulation Management Assessment/Plan:      The patient's current anticoagulation dose is Coumadin 1 mg tabs: once daily.  The target INR is 2.0-3.0.  The next INR is due 02/18/2010.  Anticoagulation instructions were given to spouse.  Results were reviewed/authorized by Eda Keys, PharmD.  He was notified by Kennieth Francois.         Prior Anticoagulation Instructions: INR 2.1 Continue 1 pill everyday except 2 pills on Mondays, Wednesdays and Fridays. Recheck  in 4 weeks.   Current Anticoagulation Instructions: INR 2.1  Continue taking one tablet every day except two tablets on Monday, Wednesday, and Friday.  We will see you in four weeks.

## 2010-06-14 NOTE — Medication Information (Signed)
Summary: Coumadin Clinic  Anticoagulant Therapy  Managed by: Cloyde Reams, RN, BSN Referring MD: Odessa Fleming PCP: [facility] [address] Supervising MD: Myrtis Ser MD, Tinnie Gens Indication 1: DVT Indication 2: Pulmonary Embolism Lab Used: [facility] [place] [facility] [place] Site: [address] PT 54.5 INR POC 6.20 INR RANGE 2.0-3.0    Bleeding/hemorrhagic complications: no     Any changes in medication regimen? yes       Details: started on cephalexin 500 mg BID started Friday   Any missed doses?: no        Comments: INR is reported >8 per POC testing.  RN to attempt a venipuncture draw; however, if unable to get an adequate stick today, she will recheck daily until the INR comes down.    Allergies: No Known Drug Allergies  Anticoagulation Management History:      His anticoagulation is being managed by telephone today.  Positive risk factors for bleeding include an age of 51 years or older and presence of serious comorbidities.  The bleeding index is 'intermediate risk'.  Positive CHADS2 values include History of HTN, Age > 68 years old, and History of Diabetes.  Prothrombin time is 54.5.  Anticoagulation responsible provider: Myrtis Ser MD, Tinnie Gens.  INR POC: 6.20.    Anticoagulation Management Assessment/Plan:      The target INR is 2.0-3.0.  The next INR is due 07/23/2009.  Anticoagulation instructions were given to spouse.  Results were reviewed/authorized by Cloyde Reams, RN, BSN.  He was notified by Cloyde Reams RN.         Prior Anticoagulation Instructions:  2 mg daily for 3 days then 2 mg each Sun, Tues, Thur and 1 mg daily.    Recheck in 6 days.  Orders to [place], to redraw Tu/Wed.  mp  Current Anticoagulation Instructions: INR 6.20  Called spoke with pt's wife advised to hold coumadin x 3 days.  Recheck PT/INR on Friday 07/23/09.  Called spoke with Judeth Cornfield, [facility] team.  Advised to redraw PT/INR on 07/23/09.

## 2010-06-14 NOTE — Medication Information (Signed)
Summary: rov/tm  Anticoagulant Therapy  Managed by: Weston Brass, PharmD Referring MD: Odessa Fleming PCP: [facility] [address] Supervising MD: Eden Emms MD, Theron Arista Indication 1: DVT Indication 2: Pulmonary Embolism Lab Used: [facility] [place] Site: [address] INR RANGE 2.0-3.0  Dietary changes: no    Health status changes: no    Bleeding/hemorrhagic complications: no    Recent/future hospitalizations: yes       Details: asthma attack , in ED [facility] for 5 hours  2 weeks ago  Any changes in medication regimen? yes       Details: patient on tapering dose of prednisone , stopped a week ago.  Recent/future dental: no  Any missed doses?: no       Is patient compliant with meds? yes       Allergies: No Known Drug Allergies  Anticoagulation Management History:      The patient is taking warfarin and comes in today for a routine follow up visit.  Positive risk factors for bleeding include an age of 60 years or older and presence of serious comorbidities.  The bleeding index is 'intermediate risk'.  Positive CHADS2 values include History of HTN, Age > 81 years old, and History of Diabetes.  Anticoagulation responsible provider: Eden Emms MD, Theron Arista.  Cuvette Lot#: [id number].  Exp: 12/2010.    Anticoagulation Management Assessment/Plan:      The target INR is 2.0-3.0.  The next INR is due 11/12/2009.  Anticoagulation instructions were given to spouse.  Results were reviewed/authorized by Weston Brass, PharmD.  He was notified by [facility].         Prior Anticoagulation Instructions: INR 2.4 Continue 2  pills everyday except on Tuesdays, Thursdays and Sundays take 1 pill. Recheck in 4 weeks.   Current Anticoagulation Instructions: INR-3.1 Take 1.5 tablets today. Resume normal dosing schedule. Take 2 tablets everyday except take 1 tablet on Sunday, Tuesday, Thursday of each week. Return in 4 weeks.

## 2010-06-14 NOTE — Miscellaneous (Signed)
Summary: Physician Orders/[facility]  Physician Orders/[facility]   Imported By: Sherian Rein 07/19/2009 07:30:49  _____________________________________________________________________  External Attachment:    Type:   Image     Comment:   External Document

## 2010-06-14 NOTE — Letter (Signed)
Summary: CMN for [facility]  CMN for [facility]   Imported By: Sherian Rein 08/31/2009 07:50:55  _____________________________________________________________________  External Attachment:    Type:   Image     Comment:   External Document

## 2010-06-14 NOTE — Progress Notes (Signed)
Summary: talk to nurse  Phone Note Call from Patient   Caller: Spouse Call For: [name] Summary of Call: need to talk to nurse about trip to the er yesterday for asthma attack Initial call taken by: Rickard Patience,  Oct 04, 2009 2:22 PM  Follow-up for Phone Call        Called and spoke with pt's spouse.  She wanted Dr Maple Hudson to be aware that pt went to ER last night at [facility] for SOB.  She states that he had asthma attack and was given neb tx and had cxr done.  She states that pt "doing great today" and denies any SOB today. Looks like he is overdue for rov so I sched him for 1st available with Dr Maple Hudson which is 10/21/09 at 10:15 am.  Will forward to Dr Maple Hudson as Lorain Childes. Follow-up by: Vernie Murders,  Oct 04, 2009 2:34 PM  Additional Follow-up for Phone Call Additional follow up Details #1::        Thanks. Please pull the Xray report into EMR. Additional Follow-up by: [name] MD,  Oct 05, 2009 11:29 AM    Additional Follow-up for Phone Call Additional follow up Details #2::    report printed and given to CY. Arman Filter LPN  Oct 05, 2009 11:41 AM

## 2010-06-14 NOTE — Miscellaneous (Signed)
Summary: OT eval order/[facility]  OT eval order/[facility]   Imported By: Sherian Rein 08/19/2009 13:42:25  _____________________________________________________________________  External Attachment:    Type:   Image     Comment:   External Document

## 2010-06-14 NOTE — Medication Information (Signed)
Summary: rov/ewj  Anticoagulant Therapy  Managed by: Eda Keys, PharmD Referring MD: Odessa Fleming PCP: [facility] [address] Supervising MD: Eden Emms MD, Theron Arista Indication 1: DVT Indication 2: Pulmonary Embolism Lab Used: [facility] [place] Site: [address] INR POC 2.1 INR RANGE 2.0-3.0  Dietary changes: no    Health status changes: no    Bleeding/hemorrhagic complications: no    Recent/future hospitalizations: yes       Details: see below  Any changes in medication regimen? no    Recent/future dental: no  Any missed doses?: no       Is patient compliant with meds? yes      Comments: Patient was on coumadin a few years ago and was discontinued.  Patient was recently admitted to hospital for PE/DVT, discharged on 07/02/09.  Pt has been followed by [facility] nursing since being discharged from the hospital.  He will now be coming to be followed in our clinic.    Allergies: No Known Drug Allergies  Anticoagulation Management History:      The patient is taking warfarin and comes in today for a routine follow up visit.  Positive risk factors for bleeding include an age of 75 years or older and presence of serious comorbidities.  The bleeding index is 'intermediate risk'.  Positive CHADS2 values include History of HTN, Age > 79 years old, and History of Diabetes.  Anticoagulation responsible provider: Eden Emms MD, Theron Arista.  INR POC: 2.1.  Cuvette Lot#: [id number].  Exp: 09/2010.    Anticoagulation Management Assessment/Plan:      The target INR is 2.0-3.0.  The next INR is due 08/25/2009.  Anticoagulation instructions were given to [place], [facility], RN.  Results were reviewed/authorized by Eda Keys, PharmD.  He was notified by Eda Keys.         Prior Anticoagulation Instructions: HH discharging pt needs CVRR appt for f/u PT/INR's. Cloyde Reams RN  August 04, 2009 3:48 PM  INR 1.57 Called spoke with pt's wife, gave day by day dosage instructions.  Advised to take 1  tablet daily except 2 tablets on Tu,Th,Sa.  Made OV for 08/11/09 in CVRR clinic.    Current Anticoagulation Instructions: INR 2.1  Continue taking 2 tablets on Tuesday, Thursday, and Saturday, and 1 tablet all other days.  Return to clinic in 2 weeks.

## 2010-06-14 NOTE — Medication Information (Signed)
Summary: Coumadin Clinic  Anticoagulant Therapy  Managed by: Cloyde Reams, RN, BSN Referring MD: Odessa Fleming PCP: [facility] [address] Supervising MD: Gala Romney MD, Daniel Indication 1: DVT Indication 2: Pulmonary Embolism Lab Used: [facility] [place] [facility] [place] Site: [address] PT 13.7 INR POC 1.2 INR RANGE 2.0-3.0    Bleeding/hemorrhagic complications: no     Any changes in medication regimen? no     Any missed doses?: no         Allergies: No Known Drug Allergies  Anticoagulation Management History:      His anticoagulation is being managed by telephone today.  Positive risk factors for bleeding include an age of 75 years or older and presence of serious comorbidities.  The bleeding index is 'intermediate risk'.  Positive CHADS2 values include History of HTN, Age > 75 years old, and History of Diabetes.  Prothrombin time is 13.7.  Anticoagulation responsible provider: Bensimhon MD, Reuel Boom.  INR POC: 1.2.    Anticoagulation Management Assessment/Plan:      The target INR is 2.0-3.0.  The next INR is due 07/09/2009.  Anticoagulation instructions were given to spouse.  Results were reviewed/authorized by Cloyde Reams, RN, BSN.  He was notified by Cloyde Reams RN.         Prior Anticoagulation Instructions: INR 0.8  Spoke with Judeth Cornfield, [facility] while at pt's home.  Advised to have pt take 2mg  today and tomorrow per Shelby Dubin, PharmD and recheck INR on Wednesday 07/07/09.  Current Anticoagulation Instructions: INR 1.2  Spoke with pt's wife, advised to continue on 2mg  daily and we will recheck on Friday 07/09/09.  [place], [facility] to recheck PT/INR on 07/09/09.

## 2010-06-14 NOTE — Medication Information (Signed)
Summary: Coumadin Clinic  Anticoagulant Therapy  Managed by: Cloyde Reams, RN, BSN Referring MD: Odessa Fleming PCP: [facility] [address] Supervising MD: Myrtis Ser MD, Tinnie Gens Indication 1: DVT Indication 2: Pulmonary Embolism Lab Used: [facility] [place] [facility] [place] Site: [address] PT 16.9 INR POC 1.9 INR RANGE 2.0-3.0    Bleeding/hemorrhagic complications: no     Any changes in medication regimen? no     Any missed doses?: no         Allergies: No Known Drug Allergies  Anticoagulation Management History:      His anticoagulation is being managed by telephone today.  Positive risk factors for bleeding include an age of 75 years or older and presence of serious comorbidities.  The bleeding index is 'intermediate risk'.  Positive CHADS2 values include History of HTN, Age > 75 years old, and History of Diabetes.  Prothrombin time is 16.9.  Anticoagulation responsible provider: Myrtis Ser MD, Tinnie Gens.  INR POC: 1.9.    Anticoagulation Management Assessment/Plan:      The target INR is 2.0-3.0.  The next INR is due 07/30/2009.  Anticoagulation instructions were given to [place], [facility], RN.  Results were reviewed/authorized by Cloyde Reams, RN, BSN.  He was notified by Cloyde Reams RN.         Prior Anticoagulation Instructions: Hold 1 day then 1 mg daily.  Orders to [place], RN to recheck on 3/15.  Current Anticoagulation Instructions: INR 1.9  Spoke with Judeth Cornfield, [facility] RN while at pt's home.  Advised to have pt start taking 1mg  daily except 2mg  on Tuesdays.  Recheck PT/INR on 07/30/09.

## 2010-06-14 NOTE — Medication Information (Signed)
Summary: Coumadin Clinic  Anticoagulant Therapy  Managed by: Bethena Midget, RN, BSN Referring MD: Odessa Fleming PCP: [facility] [address] Supervising MD: Shirlee Latch MD, Dalton Indication 1: DVT Indication 2: Pulmonary Embolism Lab Used: [facility] [place] [facility] [place] Site: [address] PT 19.3 INR POC 2.5 INR RANGE 2.0-3.0  Dietary changes: no    Health status changes: yes       Details: BIl. edema in arms.   Bleeding/hemorrhagic complications: no    Recent/future hospitalizations: no    Any changes in medication regimen? yes       Details: Doxycycline 100mg s daily for 7days  Recent/future dental: no  Any missed doses?: no       Is patient compliant with meds? yes       Allergies: No Known Drug Allergies  Anticoagulation Management History:      His anticoagulation is being managed by telephone today.  Positive risk factors for bleeding include an age of 75 years or older and presence of serious comorbidities.  The bleeding index is 'intermediate risk'.  Positive CHADS2 values include History of HTN, Age > 8 years old, and History of Diabetes.  Prothrombin time is 19.3.  Anticoagulation responsible provider: Shirlee Latch MD, Dalton.  INR POC: 2.5.    Anticoagulation Management Assessment/Plan:      The target INR is 2.0-3.0.  The next INR is due 07/16/2009.  Anticoagulation instructions were given to home health nurse.  Results were reviewed/authorized by Bethena Midget, RN, BSN.  He was notified by Bethena Midget, RN, BSN.         Prior Anticoagulation Instructions: INR 1.9 Today and Saturday take 1mg s then 2mg  on Sunday. Recheck on Monday. Order given to [facility] with [facility] at [phone]  Current Anticoagulation Instructions: INR 2.5 Change dose to 1mg s daily except 2mg s on Monday and Thursday per Dr. Jimmey Ralph and recheck on 07/16/09. Orders given to [facility] with [facility] while at home with pt.

## 2010-06-14 NOTE — Medication Information (Signed)
Summary: rov/eac  Anticoagulant Therapy  Managed by: Weston Brass, PharmD Referring MD: Odessa Fleming PCP: [facility] [address] Supervising MD: Johney Frame MD, Fayrene Fearing Indication 1: DVT Indication 2: Pulmonary Embolism Lab Used: [facility] [place] Site: [address] INR POC 1.8 INR RANGE 2.0-3.0  Dietary changes: no    Health status changes: yes       Details: having some swelling in left hand and feet; wife says it is about the same as usual  Bleeding/hemorrhagic complications: no    Recent/future hospitalizations: no    Any changes in medication regimen? no    Recent/future dental: no  Any missed doses?: no       Is patient compliant with meds? yes       Allergies: No Known Drug Allergies  Anticoagulation Management History:      The patient is taking warfarin and comes in today for a routine follow up visit.  Positive risk factors for bleeding include an age of 40 years or older and presence of serious comorbidities.  The bleeding index is 'intermediate risk'.  Positive CHADS2 values include History of HTN, Age > 87 years old, and History of Diabetes.  Anticoagulation responsible provider: Monita Swier MD, Fayrene Fearing.  INR POC: 1.8.  Cuvette Lot#: [id number].  Exp: 09/2010.    Anticoagulation Management Assessment/Plan:      The target INR is 2.0-3.0.  The next INR is due 09/08/2009.  Anticoagulation instructions were given to [place], [facility], RN.  Results were reviewed/authorized by Weston Brass, PharmD.  He was notified by Weston Brass PharmD.         Prior Anticoagulation Instructions: INR 2.1  Continue taking 2 tablets on Tuesday, Thursday, and Saturday, and 1 tablet all other days.  Return to clinic in 2 weeks.   Current Anticoagulation Instructions: INR 1.8   Change dose to 2 tablets every day except 1 tablet on Sunday, Tuesday and Thursday

## 2010-06-14 NOTE — Medication Information (Signed)
Summary: rov/sp  Anticoagulant Therapy  Managed by: Weston Brass, PharmD Referring MD: Odessa Fleming PCP: [facility] [address] Supervising MD: Jens Som MD, Arlys John Indication 1: DVT Indication 2: Pulmonary Embolism Lab Used: [facility] [place] Site: [address] INR POC 3.2 INR RANGE 2.0-3.0  Dietary changes: no    Health status changes: no    Bleeding/hemorrhagic complications: no    Recent/future hospitalizations: no    Any changes in medication regimen? no    Recent/future dental: no  Any missed doses?: no       Is patient compliant with meds? yes       Allergies: 1)  ! Advil (Ibuprofen)  Anticoagulation Management History:      The patient is taking warfarin and comes in today for a routine follow up visit.  Positive risk factors for bleeding include an age of 75 years or older and presence of serious comorbidities.  The bleeding index is 'intermediate risk'.  Positive CHADS2 values include History of HTN, Age > 4 years old, and History of Diabetes.  Anticoagulation responsible provider: Jens Som MD, Arlys John.  INR POC: 3.2.  Cuvette Lot#: [id number].  Exp: 12/2010.    Anticoagulation Management Assessment/Plan:      The patient's current anticoagulation dose is Coumadin 1 mg tabs: once daily.  The target INR is 2.0-3.0.  The next INR is due 11/29/2009.  Anticoagulation instructions were given to spouse.  Results were reviewed/authorized by Weston Brass, PharmD.  He was notified by Weston Brass PharmD.         Prior Anticoagulation Instructions: INR-3.1 Take 1.5 tablets today. Resume normal dosing schedule. Take 2 tablets everyday except take 1 tablet on Sunday, Tuesday, Thursday of each week. Return in 4 weeks.  Current Anticoagulation Instructions: INR 3.2  Take 1 tablet today then decrease dose to 1 tablet every day except 2 tablets on Monday, Wednesday and Friday.

## 2010-06-14 NOTE — Medication Information (Signed)
Summary: B[facility]onide [facility] Ipratropium/[facility]  Budesonide [facility] Ipratropium/[facility]   Imported By: Sherian Rein 07/22/2009 12:22:43  _____________________________________________________________________  External Attachment:    Type:   Image     Comment:   External Document

## 2010-06-14 NOTE — Progress Notes (Signed)
Summary: hand swelling  Phone Note From Other Clinic Call back at [phone]    [name] Call For: [name] Summary of Call: pt have swelling in left  hand  he has started  back on  lasix . it wasn't on his discharge summary is it okay to resume taking it?  Initial call taken by: [name],  July 07, 2009 3:41 PM  Follow-up for Phone Call        called spoke with [name], she states that pt had issues with swelling in the hosp and it has continued since discharge.  lasix is on pt's med list in EMR, but was not included on hosp discharge.  may pt resume this med?  [name] is aware [name] is not in the office this afternoon.  will forward to "doc-of-the-day." [name]  July 07, 2009 3:51 PM   Additional Follow-up for Phone Call Additional follow up Details #1::        I don't have any idea...will have to run that one by [name] tomorrow.  He may not want him on lasix at home, I just don't know. Additional Follow-up by: [name],  July 07, 2009 5:28 PM    Additional Follow-up for Phone Call Additional follow up Details #2::    I spoke with [name]-please call pt; he cancelled his appt for follow up visit with [name] on 07-09-09 as he has done the past 3-4 times; we understand the pt has trouble getting to appt but [name] states that there is an older son that lives at home with the parents. Please see if we can schedule the pt in to see [name] and discuss the Lasix issue (was told to discontinue the Lasix on D/C summary). Please let me know the outcome of the call.[name] [name]  July 08, 2009 9:12 AM   Spoke with pt wife and she states that the pt is too weak to come in and she does not think he will agree to an appt. She states the swelling in his hands has improved. She states she will discuss with the pt and call back if he wants to set an appt. Wife also requested I speak to [name], nurse at [name] and make her aware of recs.  I  spoke to [name] and advised her [name] recs pt come in for OV, but at this time they are refusing an appt. Sh estates she will advise them to come in. She also states pt is c/o trouble sleeping.  he has clonazepam that he uses as needed at night, but it is not helping. the pt goes to bed at 8:30pm and sleeps for 2 hours then is awake the rest of the night. [name] wants to know if ther is anything else that can be given to help pt sleep. Pelase advise. [name]  July 08, 2009 9:40 AM allergies: NKDA  Additional Follow-up for Phone Call Additional follow up Details #3:: Details for Additional Follow-up Action Taken: 1) He was recently readmitted to hosp by [name] who didn't call me, so i don't know what the issues or lasix advice were, except he was told not to restart lasix. Has renal insufficiency.  2) Ok to d/c clonazepam. Replace it with script for Silenor 6 mg, # 30, 1 at bedtime as needed for sleep, ref x 2. They need not to let him sleep a lot in the daytime if he can't sleep at night. I made the  changes to med list. Please send. Additional Follow-up by: [name] Budge [name],  July 08, 2009 11:48 AM  New/Updated Medications: SILENOR 6 MG TABS (DOXEPIN HCL) 1 for sleep if needed Prescriptions: SILENOR 6 MG TABS (DOXEPIN HCL) 1 for sleep if needed  #30 x 2   Entered by:   [name] Budge [name]   Authorized by:   Pulmonary Triage   Signed by:   [name] Budge [name] on 07/08/2009   Method used:   Historical   RxID:   [id number]  LMTCB.[name] [facility]  July 08, 2009 12:11 PM     Please refer to phone note for more instructions; spoke with spouse and nurse; pt seeing [name] today at 430pm.[name]  July 09, 2009 1:32 PM

## 2010-06-14 NOTE — Medication Information (Signed)
Summary: [facility]   Imported By: Lester [place]ton 01/19/2010 09:59:19  _____________________________________________________________________  External Attachment:    Type:   Image     Comment:   External Document

## 2010-06-14 NOTE — Medication Information (Signed)
Summary: Coumadin Clinic  Anticoagulant Therapy  Managed by: Shelby Dubin, PharmD, BCPS, CPP Referring MD: Odessa Fleming PCP: [facility] [address] Supervising MD: Juanda Chance MD, Caia Lofaro Indication 1: DVT Indication 2: Pulmonary Embolism Lab Used: [facility] [place] [facility] [place] Site: [address] PT 14.9 INR POC 1.5 INR RANGE 2.0-3.0  Dietary changes: no    Health status changes: no    Bleeding/hemorrhagic complications: no    Recent/future hospitalizations: no    Any changes in medication regimen? yes       Details: new abx: cephalexin started yesterday..   Recent/future dental: no  Any missed doses?: no       Is patient compliant with meds? yes       Current Medications (verified): 1)  Pulmicort 0.25 Mg/76ml  Susp (Budesonide (Inhalation)) .... Use Two Times A Day 2)  Metoprolol Succinate 25 Mg Xr24h-Tab (Metoprolol Succinate) .... Take One Tablet Two Times A Day 3)  Amiodarone Hcl 200 Mg  Tabs (Amiodarone Hcl) .... Take 1/2  Tablet Every Morning 4)  Lasix 40 Mg  Tabs (Furosemide) .... Take 1 Tablet By Mouth Once A Day 5)  Nitroglycerin 0.3 Mg  Subl (Nitroglycerin) .... As Needed 6)  Glipizide 5 Mg  Tabs (Glipizide) .[name].. 1 1/2 Tablets Once Daily 7)  Lovastatin 40 Mg  Tabs (Lovastatin) .... Take 1 By Mouth Once Daily 8)  Promethazine-Codeine 6.25-10 Mg/53ml Syrp (Promethazine-Codeine) .[name].. 1 Tsp Four Times Daily As Needed 9)  Silenor 6 Mg Tabs (Doxepin Hcl) .[name].. 1 For Sleep If Needed 10)  Qc Daily Multivitamins/iron  Tabs (Multiple Vitamins-Iron) .... Take 1 By Mouth Once Daily  Allergies: No Known Drug Allergies  Anticoagulation Management History:      His anticoagulation is being managed by telephone today.  Positive risk factors for bleeding include an age of 65 years or older and presence of serious comorbidities.  The bleeding index is 'intermediate risk'.  Positive CHADS2 values include History of HTN, Age > 67 years old, and History of Diabetes.  Prothrombin time is  14.9.  Anticoagulation responsible provider: Juanda Chance MD, Smitty Cords.  INR POC: 1.5.    Anticoagulation Management Assessment/Plan:      The target INR is 2.0-3.0.  The next INR is due 07/20/2009.  Anticoagulation instructions were given to home health nurse.  Results were reviewed/authorized by Shelby Dubin, PharmD, BCPS, CPP.  He was notified by Shelby Dubin PharmD, BCPS, CPP.         Prior Anticoagulation Instructions: INR 2.5 Change dose to 1mg s daily except 2mg s on Monday and Thursday per Dr. Jimmey Ralph and recheck on 07/16/09. Orders given to [facility] with [facility] while at home with pt.   Current Anticoagulation Instructions:  2 mg daily for 3 days then 2 mg each Sun, Tues, Thur and 1 mg daily.    Recheck in 6 days.  Orders to [place], to redraw Tu/Wed.  mp

## 2010-06-14 NOTE — Assessment & Plan Note (Signed)
Summary: swelling and not sleeping/kcw   Primary Provider/Referring Provider:  [facility]  CC:  Increased edema and trouble sleeping. [name]  History of Present Illness: 07/02/08- Asthma, rhinitis                         Wife here BA swallow no aspiration but Speech Therapist suggested some suspicion of reflux potential. Taught chin tuck to swallow. He has good and bad days but nothing specific now. Denies significant respiratory infection this winter and has managed asthma with his routine meds. Wife thinks he breathes a "little fast sometimes".  10/19/08- asthmaa, rhinitis , Hx Chronic AF/ pacemaker , hx DVT/ PE                    Wife here Hosp in March with labrinthitis, dizzy. Dr Graciela Husbands R/O'd PE with very low prob V/Q scan. CXR was NAD with pacemaker in place. Dr Graciela Husbands ordered PFT as part of evalution of patient's c/o "don't feel good- no pep". Patient wanted me to address PFTissue- he wants me to do it- but he and his wife insist he isn't having any breathing probs at all. Denies cough and wheeze, phlegm, dyspnea, bleeding, claudication.   12/28/08- Asthma, rhinitis.................[name] Kitchenwife here Says 2 years can't hear from  left ear and ringing- Dr Jenne Pane ENT could offer no help. Breathing worse x 2 weeks, especially yesterday. Air quality. Coughing yellow. Some vertigo and headache "cloudy". Deny fever, nausea.  July 09, 2009- Asthma, rhinitis, AF/ coumadin, DM, Renal insufficiency...........[name] Kitchenwife and son here He  had long hospital stay starting with bronchitis, CHF, AF, renal insuff. He had DVT of uncertain age, with hyposxia treated as DVT/ possible PE, started on coumadin. Went back into hospial with the hospitalists for CHF and renal insufficiency, discharged 1 week ago with no diuretic. Family claim no change in diet and no change in meds. They say they restarted his lasix at 40 mg daily. In last few days he has swollen enormously, Right arm is tender and HHN raised issue of  antibiotic for possible infection from an IV. He says he is passing urine and that his breathing is pretty good. He asks about a sleeping pill which I declined as not safe for now.     Current Medications (verified): 1)  Pulmicort 0.25 Mg/51ml  Susp (Budesonide (Inhalation)) .... Use Two Times A Day 2)  Metoprolol Succinate 25 Mg Xr24h-Tab (Metoprolol Succinate) .... Take One Tablet Two Times A Day 3)  Amiodarone Hcl 200 Mg  Tabs (Amiodarone Hcl) .... Take 1/2  Tablet Every Morning 4)  Lasix 40 Mg  Tabs (Furosemide) .... Take 1 Tablet By Mouth Once A Day 5)  Nitroglycerin 0.3 Mg  Subl (Nitroglycerin) .... As Needed 6)  Glipizide 5 Mg  Tabs (Glipizide) .[name].. 1 1/2 Tablets Once Daily 7)  Lovastatin 40 Mg  Tabs (Lovastatin) .... Take 1 By Mouth Once Daily 8)  Promethazine-Codeine 6.25-10 Mg/6ml Syrp (Promethazine-Codeine) .[name].. 1 Tsp Four Times Daily As Needed 9)  Silenor 6 Mg Tabs (Doxepin Hcl) .[name].. 1 For Sleep If Needed 10)  Qc Daily Multivitamins/iron  Tabs (Multiple Vitamins-Iron) .... 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  The patient denies anorexia, fever, weight loss, vision loss, decreased hearing, hoarseness, chest pain, syncope, prolonged cough, headaches, hemoptysis, abdominal pain, and severe indigestion/heartburn.    Vital Signs:  Patient profile:   77 year old male Height:      67 inches Weight:      167 pounds O2 Sat:      99 % on Room air Pulse rate:   66 / minute BP sitting:   130 / 60  (right arm) Cuff size:   regular  Vitals Entered By: Reynaldo Minium CMA (July 09, 2009 5:11 PM)  O2 Flow:  Room air  Physical Exam  Additional Exam:  General: A/Ox3; pleasant and cooperative, NAD, elderly, weak, calm, wheelchair, room air sat 97% SKIN: no rash, lesions NODES: no lymphadenopathy HEENT: /AT, EOM- WNL, Conjuctivae- clear, PERRLA, TM-WNL, Nose- clear, Throat- clear and wnl,  without hoarseness. NECK: Supple w/ fair ROM, JVD- trace, normal carotid impulses w/o bruits Thyroid-  CHEST:clear- no wheeze, minimal rales, unlabored HEART: irreg, no m/g/r heard, 66-70/ min ABDOMEN: Soft and nl; RUE:AVWU, nl pulses, gross anasarca all extremities, right lower arm is warmer than left NEURO: Grossly intact to observation      Impression & Recommendations:  Problem # 1:  ANASARCA (ICD-782.3)  This is probably a combination of CHF and renal insufficiency.  Breathing is good enough that gross pulmonary edema is unlikely. i don't know what has changed. Lab has closed and he would be a hard blood draw. For the weekend I will try to increase his diuretics. Heart rate is controlled. There might be some celluitis in the right arm, for which I will give doxycycline  Medications Added to Medication List This Visit: 1)  Qc Daily Multivitamins/iron Tabs (Multiple vitamins-iron) .... Take 1 by mouth once daily 2)  Doxycycline Hyclate 100 Mg Tabs (Doxycycline hyclate) .... 2 today then one daily  Other Orders: Est. Patient Level III (98119)  Patient  Instructions: 1)  Please schedule a follow-up appointment in 1 week. 2)  See Dr Howell/ [facility] as soon as possible to get medical problems under control 3)  Increase lasix to 2 x 40 mg tablets daily 4)  Avoid salt 5)  Script for doxycycline antibiotic for possible infection in your arm Prescriptions: DOXYCYCLINE HYCLATE 100 MG TABS (DOXYCYCLINE HYCLATE) 2 today then one daily  #8 x 0   Entered and Authorized by:   [name] MD   Signed by:   [name] MD on 07/09/2009   Method used:   Print then Give to Patient   RxID:   [id number]

## 2010-06-14 NOTE — Medication Information (Signed)
Summary: rov/tm  Anticoagulant Therapy  Managed by: Bethena Midget, RN, BSN Referring MD: Odessa Fleming PCP: [facility] Supervising MD: Shirlee Latch MD, Dalton Indication 1: DVT Indication 2: Pulmonary Embolism Lab Used: [facility] [place] Site: [address] INR POC 2.1 INR RANGE 2.0-3.0  Dietary changes: no    Health status changes: no    Bleeding/hemorrhagic complications: no    Recent/future hospitalizations: no    Any changes in medication regimen? no    Recent/future dental: no  Any missed doses?: no       Is patient compliant with meds? yes       Allergies: 1)  ! Advil (Ibuprofen)  Anticoagulation Management History:      The patient is taking warfarin and comes in today for a routine follow up visit.  Positive risk factors for bleeding include an age of 75 years or older and presence of serious comorbidities.  The bleeding index is 'intermediate risk'.  Positive CHADS2 values include History of HTN, Age > 67 years old, and History of Diabetes.  Anticoagulation responsible Navaya Wiatrek: Shirlee Latch MD, Dalton.  INR POC: 2.1.  Cuvette Lot#: [id number].  Exp: 02/2011.    Anticoagulation Management Assessment/Plan:      The patient's current anticoagulation dose is Coumadin 1 mg tabs: once daily.  The target INR is 2.0-3.0.  The next INR is due 01/21/2010.  Anticoagulation instructions were given to spouse.  Results were reviewed/authorized by Bethena Midget, RN, BSN.  He was notified by Bethena Midget, RN, BSN.         Prior Anticoagulation Instructions: INR 2.1 Continue 1mg s everyday except 2mg  on Mondays, Wednesdays and Fridays. Recheck in 3 weeks.   Current Anticoagulation Instructions: INR 2.1 Continue 1 pill everyday except 2 pills on Mondays, Wednesdays and Fridays. Recheck  in 4 weeks.

## 2010-06-14 NOTE — Progress Notes (Signed)
Summary: return call  Phone Note Call from Patient Call back at [phone]   Caller: [name] Call For: young Reason for Call: Talk to Doctor Summary of Call: pt's son returning a call to Dr. Maple Hudson. Initial call taken by: Eugene Gavia,  June 14, 2009 1:21 PM  Follow-up for Phone Call        called, spoke with [name].  Per [name], he has already spoken with CY and has no further questions/concerns.   Follow-up by: Gweneth Dimitri RN,  June 14, 2009 2:30 PM

## 2010-06-14 NOTE — Medication Information (Signed)
Summary: Coumadin Clinic  Anticoagulant Therapy  Managed by: Cloyde Reams, RN, BSN Referring MD: Odessa Fleming PCP: [facility] Supervising MD: Gala Romney MD, Daniel Indication 1: DVT Indication 2: Pulmonary Embolism Lab Used: [facility] [place] Site: [address] INR POC 0.8 INR RANGE 2.0-3.0    Bleeding/hemorrhagic complications: no     Any changes in medication regimen? no     Any missed doses?: no        Comments: Pt discharged on 07/03/09.  Pt did not take coumadin on 07/04/09.  Took 2mg  today.  Pt's INR went to 5 in hospital.   Allergies: No Known Drug Allergies  Anticoagulation Management History:      His anticoagulation is being managed by telephone today.  Positive risk factors for bleeding include an age of 75 years or older and presence of serious comorbidities.  The bleeding index is 'intermediate risk'.  Positive CHADS2 values include History of HTN, Age > 7 years old, and History of Diabetes.  Anticoagulation responsible provider: Bensimhon MD, Reuel Boom.  INR POC: 0.8.    Anticoagulation Management Assessment/Plan:      The target INR is 2.0-3.0.  The next INR is due 07/07/2009.  Results were reviewed/authorized by Cloyde Reams, RN, BSN.  He was notified by Bethena Midget, RN, BSN.         Current Anticoagulation Instructions: INR 0.8  Spoke with Judeth Cornfield, [facility] while at pt's home.  Advised to have pt take 2mg  today and tomorrow per Shelby Dubin, PharmD and recheck INR on Wednesday 07/07/09.

## 2010-06-14 NOTE — Medication Information (Signed)
Summary: Coumadin Clinic  Anticoagulant Therapy  Managed by: Cloyde Reams, RN, BSN Referring MD: Odessa Fleming PCP: [facility] Supervising MD: Johney Frame MD, Fayrene Fearing Indication 1: DVT Indication 2: Pulmonary Embolism Lab Used: [facility] [place] [facility] [place] Site: [address] PT 15.3 INR POC 1.57 INR RANGE 2.0-3.0    Bleeding/hemorrhagic complications: no     Any changes in medication regimen? no     Any missed doses?: no       Is patient compliant with meds? yes      Comments: Judeth Cornfield, [facility], RN called from pt's home.  States her coag machine is malfunctioning, drew PT/INR by veinapuncture, will take to lab to run STAT.  Advised if PT/INR between 2.0-3.0 ok to fax results, but if out of range call results to MD on call [phone].  Cloyde Reams RN  August 04, 2009 3:48 PM   Allergies: No Known Drug Allergies  Anticoagulation Management History:      His anticoagulation is being managed by telephone today.  Positive risk factors for bleeding include an age of 75 years or older and presence of serious comorbidities.  The bleeding index is 'intermediate risk'.  Positive CHADS2 values include History of HTN, Age > 75 years old, and History of Diabetes.  Prothrombin time is 15.3.  Anticoagulation responsible provider: Jaeden Messer MD, Fayrene Fearing.  INR POC: 1.57.    Anticoagulation Management Assessment/Plan:      The target INR is 2.0-3.0.  The next INR is due 08/11/2009.  Anticoagulation instructions were given to [place], [facility], RN.  Results were reviewed/authorized by Cloyde Reams, RN, BSN.  He was notified by Cloyde Reams RN.         Prior Anticoagulation Instructions: INR 1.7  Spoke with Trinna Post while at pt's home advised to incr dosage to 1mg  daily except 2mg  on Tuesdays and Fridays.  Recheck on 08/04/09. Next visit will be pt's last [facility] visit will need f/u in [facility].    Current Anticoagulation Instructions: HH discharging pt needs CV[facility] appt for f/u PT/INR's. Cloyde Reams RN  August 04, 2009 3:48 PM  INR 1.57 Called spoke with pt's wife, gave day by day dosage instructions.  Advised to take 1 tablet daily except 2 tablets on Tu,Th,Sa.  Made OV for 08/11/09 in CV[facility] clinic.

## 2010-06-14 NOTE — Medication Information (Signed)
Summary: coumadin ck/mt  Medications Added COUMADIN 1 MG TABS (WARFARIN SODIUM) Use as directed by anticoagulation clinic. COUMADIN 1 MG TABS (WARFARIN SODIUM) Take up to 2 tablets daily as directed by anticoagulation clinic.       Anticoagulant Therapy  Managed by: Reina Fuse, PharmD Referring MD: Odessa Fleming PCP: [facility] Supervising MD: Clifton James MD, Cristal Deer Indication 1: DVT Indication 2: Pulmonary Embolism Lab Used: [facility] [place] Site: [address] INR POC 2.2 INR RANGE 2.0-3.0  Dietary changes: no    Health status changes: no    Bleeding/hemorrhagic complications: no    Recent/future hospitalizations: no    Any changes in medication regimen? no    Recent/future dental: no  Any missed doses?: no       Is patient compliant with meds? yes      Comments: Pt not seen since Sept. Hospitalized in October.   Allergies: 1)  ! Advil (Ibuprofen)  Anticoagulation Management History:      The patient is taking warfarin and comes in today for a routine follow up visit.  Positive risk factors for bleeding include an age of 75 years or older and presence of serious comorbidities.  The bleeding index is 'intermediate risk'.  Positive CHADS2 values include History of HTN, Age > 70 years old, and History of Diabetes.  Anticoagulation responsible provider: Clifton James MD, Cristal Deer.  INR POC: 2.2.  Cuvette Lot#: [id number].  Exp: 03/2011.    Anticoagulation Management Assessment/Plan:      The patient's current anticoagulation dose is Coumadin 1 mg tabs: Take up to 2 tablets daily as directed by anticoagulation clinic..  The target INR is 2.0-3.0.  The next INR is due 05/03/2010.  Anticoagulation instructions were given to spouse.  Results were reviewed/authorized by Reina Fuse, PharmD.  He was notified by Reina Fuse PharmD.         Prior Anticoagulation Instructions: INR 2.1  Continue taking one tablet every day except two tablets on Monday, Wednesday,  and Friday.  We will see you in four weeks.    Current Anticoagulation Instructions: INR 2.2  Continue taking Coumadin 1 tab (1 mg) on Sun, Tues, Thur, Sat and Coumadin 2 tabs (2 mg) on Mon, Wed, Fri. Return to clinic in 4 weeks.  Prescriptions: COUMADIN 1 MG TABS (WARFARIN SODIUM) Take up to 2 tablets daily as directed by anticoagulation clinic.  #45 x 2   Entered by:   Reina Fuse PharmD   Authorized by:   Verne Carrow, MD   Signed by:   Reina Fuse PharmD on 04/05/2010   Method used:   Electronically to        [facility]  [address] [phone][facility] (retail)       [address]       [place], [place]  [id number]       Ph: [phone] or [phone]       Fax: [phone]   RxID:   [id number] COUMADIN 1 MG TABS (WARFARIN SODIUM) Use as directed by anticoagulation clinic.  #45 x 2   Entered by:   Reina Fuse PharmD   Authorized by:   Verne Carrow, MD   Signed by:   Reina Fuse PharmD on 04/05/2010   Method used:   [facility]ron[facility] to        [facility]  [address] [phone]* (retail)       [address]       [place]  [place], [place]  [id number]       Ph: [phone] or [phone]       Fax: [phone]   RxID:   [id number]

## 2010-06-14 NOTE — Miscellaneous (Signed)
[facility]   Imported By: Lester [place] 08/31/2009 09:18:04  _____________________________________________________________________  External Attachment:    Type:   Image     Comment:   External Document

## 2010-06-14 NOTE — Medication Information (Signed)
Summary: rov/sp  Anticoagulant Therapy  Managed by: Bethena Midget, RN, BSN Referring MD: Odessa Fleming PCP: [facility] Supervising MD: Antoine Poche MD, Fayrene Fearing Indication 1: DVT Indication 2: Pulmonary Embolism Lab Used: [facility] [place] Site: [address] INR POC 2.1 INR RANGE 2.0-3.0  Dietary changes: no    Health status changes: no    Bleeding/hemorrhagic complications: no    Recent/future hospitalizations: no    Any changes in medication regimen? no    Recent/future dental: no  Any missed doses?: no       Is patient compliant with meds? yes      Comments: Suggested 3 week appt but pt states he only wants to return in 4 weeks. Risk explained he and wife are aware.   Allergies: 1)  ! Advil (Ibuprofen)  Anticoagulation Management History:      The patient is taking warfarin and comes in today for a routine follow up visit.  Positive risk factors for bleeding include an age of 75 years or older and presence of serious comorbidities.  The bleeding index is 'intermediate risk'.  Positive CHADS2 values include History of HTN, Age > 50 years old, and History of Diabetes.  Anticoagulation responsible provider: Antoine Poche MD, Fayrene Fearing.  INR POC: 2.1.  Cuvette Lot#: [id number].  Exp: 01/2011.    Anticoagulation Management Assessment/Plan:      The patient's current anticoagulation dose is Coumadin 1 mg tabs: once daily.  The target INR is 2.0-3.0.  The next INR is due 12/27/2009.  Anticoagulation instructions were given to spouse.  Results were reviewed/authorized by Bethena Midget, RN, BSN.  He was notified by Bethena Midget, RN, BSN.         Prior Anticoagulation Instructions: INR 3.2  Take 1 tablet today then decrease dose to 1 tablet every day except 2 tablets on Monday, Wednesday and Friday.    Current Anticoagulation Instructions: INR 2.1 Continue 1mg s everyday except 2mg  on Mondays, Wednesdays and Fridays. Recheck in 3 weeks.

## 2010-06-14 NOTE — Medication Information (Signed)
Summary: Coumadin Clinic  Anticoagulant Therapy  Managed by: Cloyde Reams, RN, BSN Referring MD: Odessa Fleming PCP: [facility] Supervising MD: Clifton James MD, Cristal Deer Indication 1: DVT Indication 2: Pulmonary Embolism Lab Used: [facility] [facility] [place] Site: [address] PT 16.1 INR POC 1.7 INR RANGE 2.0-3.0    Bleeding/hemorrhagic complications: no     Any changes in medication regimen? no     Any missed doses?: no       Is patient compliant with meds? yes       Allergies: No Known Drug Allergies  Anticoagulation Management History:      His anticoagulation is being managed by telephone today.  Positive risk factors for bleeding include an age of 75 years or older and presence of serious comorbidities.  The bleeding index is 'intermediate risk'.  Positive CHADS2 values include History of HTN, Age > 9 years old, and History of Diabetes.  Prothrombin time is 16.1.  Anticoagulation responsible provider: Clifton James MD, Cristal Deer.  INR POC: 1.7.    Anticoagulation Management Assessment/Plan:      The target INR is 2.0-3.0.  The next INR is due 08/04/2009.  Anticoagulation instructions were given to [place], [facility], RN.  Results were reviewed/authorized by Cloyde Reams, RN, BSN.  He was notified by Cloyde Reams RN.         Prior Anticoagulation Instructions: INR 1.9  Spoke with Judeth Cornfield, [facility] RN while at pt's home.  Advised to have pt start taking 1mg  daily except 2mg  on Tuesdays.  Recheck PT/INR on 07/30/09.    Current Anticoagulation Instructions: INR 1.7  Spoke with Trinna Post while at pt's home advised to incr dosage to 1mg  daily except 2mg  on Tuesdays and Fridays.  Recheck on 08/04/09. Next visit will be pt's last [facility] visit will need f/u in [facility].

## 2010-06-14 NOTE — Letter (Signed)
Summary: Handout Printed  Printed Handout:  - Coumadin Instructions-w/out Meds 

## 2010-06-14 NOTE — Progress Notes (Signed)
Summary: rx for xopenex, pulmicort and ipratropium   Phone Note Call from Patient Call back at [phone]   Caller: [facility] Call For: Maple Hudson Reason for Call: Talk to Nurse Summary of Call: pt said there were changes made in his nebulizer meds.  Need scripts.  Never got the scripts.  Fax# [phone] Initial call taken by: Eugene Gavia,  July 06, 2009 10:59 AM  Follow-up for Phone Call        pt was recently at [facility] from 06-08-2009 to 06-25-2009.  Per d/c summary in Echart,  Albuterol neb was d/c'd and pt was switched to Xopenex 0.63mg  1 vial four times a day.  Also may take q4h as needed.  Per summary, pulmicort 0.25mg  was increased from two times a day to four times a day.  Also, Ipratropium 0.5mg  was added at four times a day.  Will send order to [facility] regarding these changes in neb meds. Arman Filter LPN  July 06, 2009 11:20 AM

## 2010-06-14 NOTE — Progress Notes (Signed)
Summary: REFILL/ FAX COMING  Phone Note From Pharmacy   Caller: [facility] W/ [facility] Call For: YOUNG  Summary of Call: CALLER WILL RE-FAX A REFILL REQUEST FOR BUDESONIDE AND IPRATROPIUM (THIS WILL BE HER 2ND FAX REQUEST. CANDI # [phone] X [phone] Initial call taken by: Tivis Ringer, CNA,  March 23, 2010 12:26 PM  Follow-up for Phone Call        form palced on CY look-at to sign.Carron Curie CMA  March 23, 2010 12:36 PM   Additional Follow-up for Phone Call Additional follow up Details #1::        Done Additional Follow-up by: [name] MD,  March 23, 2010 2:10 PM

## 2010-06-14 NOTE — Assessment & Plan Note (Signed)
Summary: 1 year rov      Allergies Added:   Primary Provider:  [facility] [address] [address]  CC:  1 year rov/pt reports no cardiac concerns.  .  History of Present Illness: [name] is seen in followup for ischemic heart disease with normal left ventricular function and an occluded circumflex a few years ago who also has bradycardia paroxysmal atrial fibrillation and previously implanted pacemaker.  He takes amiodarone;  last TSH was 0.94;  catheterization 2009 demonstrated occluded circumflex echo 2009 demonstrated normal ejection fraction   The patient denies SOB, chest pain, edema or palpitations     Current Medications (verified): 1)  Metoprolol Succinate 25 Mg Xr24h-Tab (Metoprolol Succinate) .... Take One Tablet Two Times A Day 2)  Amiodarone Hcl 200 Mg  Tabs (Amiodarone Hcl) .... Take 1/2  Tablet Every Morning 3)  Lasix 40 Mg  Tabs (Furosemide) .... Take 1 Tablet By Mouth Once A Day 4)  Nitroglycerin 0.3 Mg  Subl (Nitroglycerin) .... As Needed 5)  Glipizide 5 Mg  Tabs (Glipizide) .... 2  Tablets Once Daily 6)  Lovastatin 40 Mg  Tabs (Lovastatin) .... Take 1 By Mouth Once Daily 7)  Promethazine-Codeine 6.25-10 Mg/41ml Syrp (Promethazine-Codeine) .[name].. 1 Tsp Four Times Daily As Needed 8)  Silenor 6 Mg Tabs (Doxepin Hcl) .[name].. 1 For Sleep If Needed 9)  Qc Daily Multivitamins/iron  Tabs (Multiple Vitamins-Iron) .... Take 1 By Mouth Once Daily 10)  Clonazepam 0.5 Mg Tabs (Clonazepam) .... Take 1 Tablet At Bedtime As Needed Sleep 11)  Coumadin 1 Mg Tabs (Warfarin Sodium) .... Once Daily 12)  Ipratropium Bromide 0.02 % Soln (Ipratropium Bromide) .[name].. 1 Neb Four Times A Day As Needed 13)  Pulmicort 0.25 Mg/50ml  Susp (Budesonide (Inhalation)) .... Use Two Times A Day  Allergies (verified): 1)  ! Advil (Ibuprofen)  Past History:  Past Medical History: Last updated: 11/05/2009  PACEMAKER, PERMANENT (ICD-V45.01)- Medtronic Adapta  ATRIAL FIBRILLATION, HX OF (ICD-V12.59) HYPERTENSION  (ICD-401.9) DM (ICD-250.00) CAD (ICD-414.00) PULMONARY EMBOLISM (ICD-415.19) DEEP VENOUS THROMBOPHLEBITIS (ICD-453.40) NASAL POLYP (ICD-471.0) ALLERGIC RHINITIS (ICD-477.9) ASTHMA (ICD-493.90) Renal insufficiency  Past Surgical History: Last updated: 11/05/2009 Cataracts/ lens impants nasal polypectomy Cardiac stent- eluting pacemaker implant- Medtronic Adapta   Family History: Last updated: 10/13/2008 Negative FH of Diabetes, Hypertension, or Coronary Artery Disease  Social History: Last updated: 05/25/2008 married former Occupational hygienist  Vital Signs:  Patient profile:   77 year old male Height:      67 inches Weight:      155 pounds BMI:     24.36 Pulse rate:   71 / minute Pulse rhythm:   regular BP sitting:   118 / 68  (left arm) Cuff size:   regular  Vitals Entered By: Judithe Modest CMA (November 08, 2009 11:13 AM)  Physical Exam  General:  The patient was alert and oriented in no acute distress. he ambulates with a walker HEENT Normal.  Neck veins were flat, carotids were brisk.  Lungs were clear.  Heart sounds were regular without murmurs or gallops.  Abdomen was soft with active bowel sounds. There is no clubbing cyanosis trace edema Skin Warm and dry    EKG  Procedure date:  11/08/2009  Findings:      atrial pacing at 70 intervals 0.20/0.11/0.43 Early transition LAFB Otherwise normal  PPM Specifications Following MD:  Sherryl Manges, MD     PPM Vendor:  Medtronic     PPM Model Number:  [id number]     PPM Serial Number:  [id number] PPM DOI:  04/26/2006     PPM Implanting MD:  Sherryl Manges, MD  Lead 1    Location: RA     DOI: 04/26/2006     Model #: [id number]     Serial #: [id number]     Status: active Lead 2    Location: RV     DOI: 04/11/2006     Model #: [id number]     Serial #: [id number]     Status: active   Indications:  Tachy brady syndrome; A-fib   PPM Follow Up Remote Check?  No Battery Voltage:  2.78 V     Battery Est. Longevity:  6.5 YEARS     Pacer  Dependent:  No       PPM Device Measurements Atrium  Amplitude: 2.8 mV, Impedance: 518 ohms, Threshold: 1.25 V at 0.4 msec Right Ventricle  Amplitude: 22.4 mV, Impedance: 509 ohms, Threshold: 0.75 V at 0.4 msec  Episodes MS Episodes:  1915     Percent Mode Switch:  1.4%     Coumadin:  Yes Ventricular High Rate:  3     Atrial Pacing:  47.3%     Ventricular Pacing:  0.3%  Parameters Mode:  MVP (R)     Lower Rate Limit:  60     Upper Rate Limit:  120 Paced AV Delay:  150     Sensed AV Delay:  120 Next Cardiology Appt Due:  04/14/2010 Tech Comments:  Normal device function.  Pt with RVR during episodes of afib.  All VHR episodes are afib with RVR.  No changes made today.  ROV 6 months clinic. Gypsy Balsam RN BSN  November 08, 2009 11:34 AM   Impression & Recommendations:  Problem # 1:  PACEMAKER, PERMANENT- MEDTRONIC ADAPTA (ICD-V45.01) Device parameters and data were reviewed and no changes were made  Problem # 2:  ATRIAL FIBRILLATION (ICD-427.31) Pt has paroxysmal atrial fibrillation. This occurs about one percent of the time. It's associated with a rapid ventricular response. However, given his in frequency we will not change his medications.  He'll he has amiodarone surveillance laboratories repeated. I would not be surprised if we find hyperthyroid His updated medication list for this problem includes:    Metoprolol Succinate 25 Mg Xr24h-tab (Metoprolol succinate) .[name]... Take one tablet two times a day    Amiodarone Hcl 200 Mg Tabs (Amiodarone hcl) .[name]... Take 1/2  tablet every morning    Coumadin 1 Mg Tabs (Warfarin sodium) ..... Once daily  Orders: EKG w/ Interpretation (93000) TLB-TSH (Thyroid Stimulating Hormone) (84443-TSH) TLB-Hepatic/Liver Function Pnl (80076-HEPATIC)  Problem # 3:  CAD (ICD-414.00) stable His updated medication list for this problem includes:    Metoprolol Succinate 25 Mg Xr24h-tab (Metoprolol succinate) .[name]... Take one tablet two times a day    Nitroglycerin  0.3 Mg Subl (Nitroglycerin) .[name]... As needed    Coumadin 1 Mg Tabs (Warfarin sodium) ..... Once daily  Patient Instructions: 1)  Your physician recommends that you return for lab work today. 2)  Your physician recommends that you schedule a follow-up appointment in: 6 months with device clinic. 3)  Your physician recommends that you continue on your current medications as directed. Please refer to the Current Medication list given to you today.

## 2010-06-14 NOTE — Medication Information (Signed)
Summary: Coumadin Clinic  Anticoagulant Therapy  Managed by: Bethena Midget, RN, BSN Referring MD: Odessa Fleming PCP: [facility] [address] [address] Supervising MD: Jens Som MD, Arlys John Indication 1: DVT Indication 2: Pulmonary Embolism Lab Used: [facility] [place] [facility] [place] Site: [address] PT 16.9 INR POC 1.9 INR RANGE 2.0-3.0  Dietary changes: no    Health status changes: no    Bleeding/hemorrhagic complications: no    Recent/future hospitalizations: no    Any changes in medication regimen? no    Recent/future dental: no  Any missed doses?: no       Is patient compliant with meds? yes      Comments: Wife states that pt. sees Dr.Young today for questionable infection in arm. Wife call if ABX is started.   Allergies: No Known Drug Allergies  Anticoagulation Management History:      His anticoagulation is being managed by telephone today.  Positive risk factors for bleeding include an age of 75 years or older and presence of serious comorbidities.  The bleeding index is 'intermediate risk'.  Positive CHADS2 values include History of HTN, Age > 75 years old, and History of Diabetes.  Prothrombin time is 16.9.  Anticoagulation responsible provider: Jens Som MD, Arlys John.  INR POC: 1.9.    Anticoagulation Management Assessment/Plan:      The target INR is 2.0-3.0.  The next INR is due 07/12/2009.  Anticoagulation instructions were given to spouse.  Results were reviewed/authorized by Bethena Midget, RN, BSN.  He was notified by Bethena Midget, RN, BSN.         Prior Anticoagulation Instructions: INR 1.2  Spoke with pt's wife, advised to continue on 2mg  daily and we will recheck on Friday 07/09/09.  [place], [facility] to recheck PT/INR on 07/09/09.  Current Anticoagulation Instructions: INR 1.9 Today and Saturday take 1mg s then 2mg  on Sunday. Recheck on Monday. Order given to [place] with [facility] at [phone]

## 2010-06-14 NOTE — Progress Notes (Signed)
Summary: results/ speak to cy  Phone Note Call from Patient   Caller: Son Call For: Keah Lamba Summary of Call: pt's son gerald wants to speak to dr Mccrae Speciale re: pt. says pt had a test yesterday and son wants to know results of this as well as questions about pt's diet- says they won't let pt have ice cream. pt also states that pt and pt's spouse would prefer that dr Jakolby Sedivy speak to son (caller) as  "they" never really understand what dr Leeana Creer has told them. gerald Wesch [phone] Initial call taken by: Tivis Ringer, CNA,  June 22, 2009 10:32 AM  Follow-up for Phone Call        please advise. Carron Curie CMA  June 22, 2009 11:07 AM   Additional Follow-up for Phone Call Additional follow up Details #1::        Done Additional Follow-up by: [name] MD,  June 24, 2009 2:27 PM

## 2010-06-14 NOTE — Medication Information (Signed)
Summary: [facility]  [facility]   Imported By: Sherian Rein 03/31/2010 12:57:36  _____________________________________________________________________  External Attachment:    Type:   Image     Comment:   External Document

## 2010-06-16 NOTE — Procedures (Signed)
Summary: PC2 CHECK  Medications Added GLIPIZIDE 5 MG  TABS (GLIPIZIDE) Take 1 & 1/2 tablets by mouth once daily      Allergies Added:   Current Medications (verified): 1)  Metoprolol Succinate 25 Mg Xr24h-Tab (Metoprolol Succinate) .... Take One Tablet Two Times A Day 2)  Amiodarone Hcl 200 Mg  Tabs (Amiodarone Hcl) .... Take 1/2  Tablet Every Morning 3)  Lasix 40 Mg  Tabs (Furosemide) .... Take 1 Tablet By Mouth Once A Day 4)  Nitroglycerin 0.3 Mg  Subl (Nitroglycerin) .... As Needed 5)  Glipizide 5 Mg  Tabs (Glipizide) .... Take 1 & 1/2 Tablets By Mouth Once Daily 6)  Lovastatin 40 Mg  Tabs (Lovastatin) .... Take 1 By Mouth Once Daily 7)  Promethazine-Codeine 6.25-10 Mg/81ml Syrp (Promethazine-Codeine) .[name].. 1 Tsp Four Times Daily As Needed 8)  Silenor 6 Mg Tabs (Doxepin Hcl) .[name].. 1 For Sleep If Needed 9)  Qc Daily Multivitamins/iron  Tabs (Multiple Vitamins-Iron) .... Take 1 By Mouth Once Daily 10)  Clonazepam 0.5 Mg Tabs (Clonazepam) .... Take 1 Tablet At Bedtime As Needed Sleep 11)  Coumadin 1 Mg Tabs (Warfarin Sodium) .... Take Up To 2 Tablets Daily As Directed By Anticoagulation Clinic. 12)  Ipratropium Bromide 0.02 % Soln (Ipratropium Bromide) .[name].. 1 Neb Four Times A Day As Needed 13)  Pulmicort 0.25 Mg/76ml  Susp (Budesonide (Inhalation)) .... Use Two Times A Day  Allergies (verified): 1)  ! Advil (Ibuprofen)  PPM Specifications Following MD:  Sherryl Manges, MD     PPM Vendor:  Medtronic     PPM Model Number:  [id number]     PPM Serial Number:  [id number] PPM DOI:  04/26/2006     PPM Implanting MD:  Sherryl Manges, MD  Lead 1    Location: RA     DOI: 04/26/2006     Model #: [id number]     Serial #: [id number]     Status: active Lead 2    Location: RV     DOI: 04/11/2006     Model #: [id number]     Serial #: [id number]     Status: active   Indications:  Tachy brady syndrome; A-fib   PPM Follow Up Battery Voltage:  2.78 V     Battery Est. Longevity:  6 yrs     Pacer Dependent:  No       PPM  Device Measurements Atrium  Amplitude: 5.60 mV, Impedance: 508 ohms, Threshold: 1.00 V at 0.40 msec Right Ventricle  Amplitude: 15.68 mV, Impedance: 508 ohms, Threshold: 0.50 V at 0.40 msec  Episodes MS Episodes:  0     Coumadin:  Yes Ventricular High Rate:  0     Atrial Pacing:  56.8%     Ventricular Pacing:  0.2%  Parameters Mode:  MVP (R)     Lower Rate Limit:  60     Upper Rate Limit:  120 Paced AV Delay:  150     Sensed AV Delay:  120 Next Cardiology Appt Due:  10/14/2010 Tech Comments:  NORMAL DEVICE FUNCTION.  NO EPISODES SINCE LAST CHECK.  CHANGED RA OUTPUT FROM 2.250 TO 2.00 V.  ROV IN 6 MTHS W/SK. Vella Kohler  May 12, 2010 10:52 AM

## 2010-06-16 NOTE — Cardiovascular Report (Signed)
Summary: Office Visit   Office Visit   Imported By: Roderic Ovens 05/24/2010 16:19:07  _____________________________________________________________________  External Attachment:    Type:   Image     Comment:   External Document

## 2010-06-16 NOTE — Medication Information (Signed)
Summary: rov/sl   Anticoagulant Therapy  Managed by: Weston Brass, PharmD Referring MD: Odessa Fleming PCP: [facility] [address] Supervising MD: Daleen Squibb MD, Maisie Fus Indication 1: DVT Indication 2: Pulmonary Embolism Lab Used: [facility] [place] Site: [address] INR POC 2.4 INR RANGE 2.0-3.0  Dietary changes: no    Health status changes: no    Bleeding/hemorrhagic complications: no    Recent/future hospitalizations: no    Any changes in medication regimen? no    Recent/future dental: no  Any missed doses?: no       Is patient compliant with meds? yes       Allergies: 1)  ! Advil (Ibuprofen)  Anticoagulation Management History:      The patient is taking warfarin and comes in today for a routine follow up visit.  Positive risk factors for bleeding include an age of 101 years or older and presence of serious comorbidities.  The bleeding index is 'intermediate risk'.  Positive CHADS2 values include History of HTN, Age > 1 years old, and History of Diabetes.  Anticoagulation responsible Brantley Naser: Daleen Squibb MD, Maisie Fus.  INR POC: 2.4.  Cuvette Lot#: [id number].  Exp: 06/2011.    Anticoagulation Management Assessment/Plan:      The patient's current anticoagulation dose is Coumadin 1 mg tabs: Take up to 2 tablets daily as directed by anticoagulation clinic..  The target INR is 2.0-3.0.  The next INR is due 05/31/2010.  Anticoagulation instructions were given to spouse.  Results were reviewed/authorized by Weston Brass, PharmD.  He was notified by Weston Brass PharmD.         Prior Anticoagulation Instructions: INR 2.2  Continue taking Coumadin 1 tab (1 mg) on Sun, Tues, Thur, Sat and Coumadin 2 tabs (2 mg) on Mon, Wed, Fri. Return to clinic in 4 weeks.   Current Anticoagulation Instructions: INR 2.4  Continue same dose of 1 tablet every day except 2 tablets on Monday, Wednesday and Friday.  Recheck INR in 4 weeks.

## 2010-07-06 ENCOUNTER — Telehealth: Payer: Self-pay | Admitting: Internal Medicine

## 2010-07-08 ENCOUNTER — Encounter: Payer: Self-pay | Admitting: Internal Medicine

## 2010-07-08 ENCOUNTER — Encounter (INDEPENDENT_AMBULATORY_CARE_PROVIDER_SITE_OTHER): Payer: Medicare Other

## 2010-07-08 DIAGNOSIS — I2699 Other pulmonary embolism without acute cor pulmonale: Secondary | ICD-10-CM

## 2010-07-08 DIAGNOSIS — Z7901 Long term (current) use of anticoagulants: Secondary | ICD-10-CM

## 2010-07-08 DIAGNOSIS — I4891 Unspecified atrial fibrillation: Secondary | ICD-10-CM

## 2010-07-08 DIAGNOSIS — I82409 Acute embolism and thrombosis of unspecified deep veins of unspecified lower extremity: Secondary | ICD-10-CM

## 2010-07-12 NOTE — Medication Information (Signed)
Summary: rov/ewj  Anticoagulant Therapy  Managed by: Bethena Midget, RN, BSN Referring MD: Odessa Fleming PCP: [facility] [address] Supervising MD: Tenny Craw MD, Gunnar Fusi Indication 1: DVT Indication 2: Pulmonary Embolism Lab Used: [facility] [place] Site: [address] INR POC 2.9 INR RANGE 2.0-3.0  Dietary changes: no    Health status changes: no    Bleeding/hemorrhagic complications: no    Recent/future hospitalizations: no    Any changes in medication regimen? yes       Details: Completed Cefdinir and prednisone last week.   Recent/future dental: no  Any missed doses?: no       Is patient compliant with meds? yes       Allergies: 1)  ! Advil (Ibuprofen)  Anticoagulation Management History:      The patient is taking warfarin and comes in today for a routine follow up visit.  Positive risk factors for bleeding include an age of 7 years or older and presence of serious comorbidities.  The bleeding index is 'intermediate risk'.  Positive CHADS2 values include History of HTN, Age > 2 years old, and History of Diabetes.  Anticoagulation responsible provider: Tenny Craw MD, Gunnar Fusi.  INR POC: 2.9.  Cuvette Lot#: [id number].  Exp: 05/2011.    Anticoagulation Management Assessment/Plan:      The patient's current anticoagulation dose is Coumadin 1 mg tabs: Take up to 2 tablets daily as directed by anticoagulation clinic..  The target INR is 2.0-3.0.  The next INR is due 08/05/2010.  Anticoagulation instructions were given to spouse.  Results were reviewed/authorized by Bethena Midget, RN, BSN.  He was notified by Bethena Midget, RN, BSN.         Prior Anticoagulation Instructions: INR 2.4  Continue same dose of 1 tablet every day except 2 tablets on Monday, Wednesday and Friday.  Recheck INR in 4 weeks.   Current Anticoagulation Instructions: INR 2.9 Continue 1 pill everyday except 2 pills on Mondays, Wednesdays and Fridays. Recheck in 4 weeks.  Prescriptions: COUMADIN 1 MG TABS  (WARFARIN SODIUM) Take up to 2 tablets daily as directed by anticoagulation clinic.  #45 x 0   Entered by:   Bethena Midget, RN, BSN   Authorized by:   Nathen May, MD, [facility]   Signed by:   Bethena Midget, RN, BSN on 07/08/2010   Method used:   Electronically to        [phone] (retail)       [address]       [place], [place]  [id number]       Ph: [phone] or [phone]       Fax: [phone]   RxID:   [id number]

## 2010-07-12 NOTE — Progress Notes (Addendum)
Summary: Pt. overdue for Coumadin follow-up   Phone Note Outgoing Call   Call placed by: Windell Hummingbird,  July 06, 2010 9:47 AM Call placed to: Patient Action Taken: Phone Call Completed Reason for Call: Get patient information Details for Reason: Pt. overdue for coumadin follow-up Details of Action Taken: Coumadin refill denied until has INR checked Summary of Call: Spoke with patient's wife. We received refill request for Coumadin, but pt. hasn't had INR checked since 05/03/2010. Pt. had a fall w/ ankle injury.  Currently wearing a boot on foot, but he is feeling weak.  Per wife pt. has been on antibiotics and prednisone.  Explained importance of checking INR especially since on these meds.  Pt. yelled in background "They'll just have to deal with it.  I'm not going anywhere until I get better."  Denied refill.  Offerred to meet pt. at entrance w/ wheelchair.  Wife will try to talk pt. into coming and call us when he will come.Windell Hummingbird  July 06, 2010 9:56 AM

## 2010-07-27 LAB — BASIC METABOLIC PANEL
BUN: 44 mg/dL — ABNORMAL HIGH (ref 6–23)
BUN: 45 mg/dL — ABNORMAL HIGH (ref 6–23)
BUN: 46 mg/dL — ABNORMAL HIGH (ref 6–23)
BUN: 47 mg/dL — ABNORMAL HIGH (ref 6–23)
BUN: 36 mg/dL — ABNORMAL HIGH (ref 6–23)
CO2: 26 mEq/L (ref 19–32)
Calcium: 7.7 mg/dL — ABNORMAL LOW (ref 8.4–10.5)
Calcium: 8.3 mg/dL — ABNORMAL LOW (ref 8.4–10.5)
Calcium: 8.3 mg/dL — ABNORMAL LOW (ref 8.4–10.5)
Chloride: 111 mEq/L (ref 96–112)
Creatinine, Ser: 2.05 mg/dL — ABNORMAL HIGH (ref 0.4–1.5)
Creatinine, Ser: 2.19 mg/dL — ABNORMAL HIGH (ref 0.4–1.5)
Creatinine, Ser: 2.47 mg/dL — ABNORMAL HIGH (ref 0.4–1.5)
Creatinine, Ser: 2.93 mg/dL — ABNORMAL HIGH (ref 0.4–1.5)
GFR calc Af Amer: 37 mL/min — ABNORMAL LOW (ref >60)
GFR calc non Af Amer: 25 mL/min — ABNORMAL LOW (ref >60)
GFR calc non Af Amer: 29 mL/min — ABNORMAL LOW (ref >60)
GFR calc non Af Amer: 31 mL/min — ABNORMAL LOW (ref >60)
GFR calc non Af Amer: 20 mL/min — ABNORMAL LOW (ref >60)
Glucose, Bld: 162 mg/dL — ABNORMAL HIGH (ref 70–99)
Glucose, Bld: 220 mg/dL — ABNORMAL HIGH (ref 70–99)
Glucose, Bld: 235 mg/dL — ABNORMAL HIGH (ref 70–99)
Potassium: 4.4 mEq/L (ref 3.5–5.1)
Potassium: 5.1 mEq/L (ref 3.5–5.1)

## 2010-07-27 LAB — GLUCOSE, CAPILLARY
Glucose-Capillary: 105 mg/dL — ABNORMAL HIGH (ref 70–99)
Glucose-Capillary: 136 mg/dL — ABNORMAL HIGH (ref 70–99)
Glucose-Capillary: 158 mg/dL — ABNORMAL HIGH (ref 70–99)
Glucose-Capillary: 195 mg/dL — ABNORMAL HIGH (ref 70–99)
Glucose-Capillary: 201 mg/dL — ABNORMAL HIGH (ref 70–99)
Glucose-Capillary: 247 mg/dL — ABNORMAL HIGH (ref 70–99)
Glucose-Capillary: 288 mg/dL — ABNORMAL HIGH (ref 70–99)
Glucose-Capillary: 195 mg/dL — ABNORMAL HIGH (ref 70–99)
Glucose-Capillary: 264 mg/dL — ABNORMAL HIGH (ref 70–99)
Glucose-Capillary: 290 mg/dL — ABNORMAL HIGH (ref 70–99)
Glucose-Capillary: 325 mg/dL — ABNORMAL HIGH (ref 70–99)

## 2010-07-27 LAB — COMPREHENSIVE METABOLIC PANEL
BUN: 17 mg/dL (ref 6–23)
CO2: 26 mEq/L (ref 19–32)
Chloride: 104 mEq/L (ref 96–112)
Creatinine, Ser: 2.7 mg/dL — ABNORMAL HIGH (ref 0.4–1.5)
GFR calc non Af Amer: 22 mL/min — ABNORMAL LOW (ref >60)
Glucose, Bld: 298 mg/dL — ABNORMAL HIGH (ref 70–99)
Total Bilirubin: 1.1 mg/dL (ref 0.3–1.2)

## 2010-07-27 LAB — PROTIME-INR
INR: 2.34 — ABNORMAL HIGH (ref 0.00–1.49)
INR: 3.3 — ABNORMAL HIGH (ref 0.00–1.49)
Prothrombin Time: 25.8 seconds — ABNORMAL HIGH (ref 11.6–15.2)
Prothrombin Time: 30.6 seconds — ABNORMAL HIGH (ref 11.6–15.2)
Prothrombin Time: 36.2 seconds — ABNORMAL HIGH (ref 11.6–15.2)

## 2010-07-27 LAB — CBC
HCT: 34.5 % — ABNORMAL LOW (ref 39.0–52.0)
HCT: 34.7 % — ABNORMAL LOW (ref 39.0–52.0)
HCT: 37.3 % — ABNORMAL LOW (ref 39.0–52.0)
HCT: 43.7 % (ref 39.0–52.0)
Hemoglobin: 13.5 g/dL (ref 13.0–17.0)
Hemoglobin: 14.3 g/dL (ref 13.0–17.0)
MCH: 31.5 pg (ref 26.0–34.0)
MCH: 32.6 pg (ref 26.0–34.0)
MCHC: 32.8 g/dL (ref 30.0–36.0)
MCHC: 33 g/dL (ref 30.0–36.0)
MCHC: 33.1 g/dL (ref 30.0–36.0)
MCV: 96.9 fL (ref 78.0–100.0)
MCV: 96.9 fL (ref 78.0–100.0)
MCV: 97.5 fL (ref 78.0–100.0)
MCV: 98.8 fL (ref 78.0–100.0)
Platelets: 207 10*3/uL (ref 150–400)
Platelets: 219 10*3/uL (ref 150–400)
Platelets: 248 10*3/uL (ref 150–400)
Platelets: 219 10*3/uL (ref 150–400)
Platelets: 231 10*3/uL (ref 150–400)
RBC: 3.56 MIL/uL — ABNORMAL LOW (ref 4.22–5.81)
RBC: 3.91 MIL/uL — ABNORMAL LOW (ref 4.22–5.81)
RBC: 4.14 MIL/uL — ABNORMAL LOW (ref 4.22–5.81)
RBC: 4.43 MIL/uL (ref 4.22–5.81)
RDW: 14.2 % (ref 11.5–15.5)
RDW: 14.2 % (ref 11.5–15.5)
RDW: 14.3 % (ref 11.5–15.5)
RDW: 14.5 % (ref 11.5–15.5)
RDW: 14.3 % (ref 11.5–15.5)
WBC: 10.8 10*3/uL — ABNORMAL HIGH (ref 4.0–10.5)
WBC: 11.3 10*3/uL — ABNORMAL HIGH (ref 4.0–10.5)
WBC: 14.5 10*3/uL — ABNORMAL HIGH (ref 4.0–10.5)
WBC: 15.1 10*3/uL — ABNORMAL HIGH (ref 4.0–10.5)
WBC: 17.4 10*3/uL — ABNORMAL HIGH (ref 4.0–10.5)

## 2010-07-27 LAB — DIFFERENTIAL
Basophils Absolute: 0 10*3/uL (ref 0.0–0.1)
Basophils Absolute: 0 10*3/uL (ref 0.0–0.1)
Basophils Absolute: 0 10*3/uL (ref 0.0–0.1)
Basophils Absolute: 0 10*3/uL (ref 0.0–0.1)
Basophils Absolute: 0 10*3/uL (ref 0.0–0.1)
Basophils Relative: 0 % (ref 0–1)
Basophils Relative: 0 % (ref 0–1)
Basophils Relative: 0 % (ref 0–1)
Eosinophils Relative: 0 % (ref 0–5)
Eosinophils Relative: 0 % (ref 0–5)
Eosinophils Relative: 1 % (ref 0–5)
Lymphocytes Relative: 10 % — ABNORMAL LOW (ref 12–46)
Lymphocytes Relative: 16 % (ref 12–46)
Lymphocytes Relative: 6 % — ABNORMAL LOW (ref 12–46)
Lymphocytes Relative: 26 % (ref 12–46)
Lymphocytes Relative: 5 % — ABNORMAL LOW (ref 12–46)
Lymphs Abs: 1.4 10*3/uL (ref 0.7–4.0)
Lymphs Abs: 1.7 10*3/uL (ref 0.7–4.0)
Lymphs Abs: 3.9 10*3/uL (ref 0.7–4.0)
Monocytes Absolute: 0.3 10*3/uL (ref 0.1–1.0)
Monocytes Absolute: 0.8 10*3/uL (ref 0.1–1.0)
Monocytes Absolute: 0.9 10*3/uL (ref 0.1–1.0)
Monocytes Absolute: 0.3 10*3/uL (ref 0.1–1.0)
Monocytes Relative: 2 % — ABNORMAL LOW (ref 3–12)
Neutro Abs: 11.8 10*3/uL — ABNORMAL HIGH (ref 1.7–7.7)
Neutro Abs: 11.8 10*3/uL — ABNORMAL HIGH (ref 1.7–7.7)
Neutro Abs: 10 10*3/uL — ABNORMAL HIGH (ref 1.7–7.7)
Neutro Abs: 15.4 10*3/uL — ABNORMAL HIGH (ref 1.7–7.7)
Neutrophils Relative %: 81 % — ABNORMAL HIGH (ref 43–77)
Neutrophils Relative %: 88 % — ABNORMAL HIGH (ref 43–77)

## 2010-07-27 LAB — CLOSTRIDIUM DIFFICILE EIA: C difficile Toxins A+B, EIA: NEGATIVE

## 2010-07-27 LAB — POCT CARDIAC MARKERS
CKMB, poc: <1 ng/mL — ABNORMAL LOW (ref 1.0–8.0)
Troponin i, poc: <0.05 ng/mL (ref 0.00–0.09)

## 2010-07-27 LAB — POCT I-STAT, CHEM 8
Chloride: 103 mEq/L (ref 96–112)
Glucose, Bld: 192 mg/dL — ABNORMAL HIGH (ref 70–99)
HCT: 44 % (ref 39.0–52.0)
Potassium: 3.2 mEq/L — ABNORMAL LOW (ref 3.5–5.1)

## 2010-07-27 LAB — CARDIAC PANEL(CRET KIN+CKTOT+MB+TROPI)
CK, MB: 8.6 ng/mL (ref 0.3–4.0)
Relative Index: 1.9 (ref 0.0–2.5)
Relative Index: 2 (ref 0.0–2.5)
Relative Index: INVALID (ref 0.0–2.5)
Total CK: 429 U/L — ABNORMAL HIGH (ref 7–232)
Troponin I: 0.02 ng/mL (ref 0.00–0.06)
Troponin I: 0.03 ng/mL (ref 0.00–0.06)

## 2010-07-27 LAB — CULTURE, RESPIRATORY W GRAM STAIN

## 2010-07-27 LAB — URINE CULTURE: Colony Count: 50000

## 2010-07-27 LAB — EXPECTORATED SPUTUM ASSESSMENT W GRAM STAIN, RFLX TO RESP C

## 2010-07-27 LAB — SODIUM, URINE, RANDOM
Sodium, Ur: 20 mEq/L
Sodium, Ur: 61 mEq/L

## 2010-07-27 LAB — POCT I-STAT 3, VENOUS BLOOD GAS (G3P V)
Bicarbonate: 26.7 mEq/L — ABNORMAL HIGH (ref 20.0–24.0)
pH, Ven: 7.391 — ABNORMAL HIGH (ref 7.250–7.300)
pO2, Ven: 28 mmHg — CL (ref 30.0–45.0)

## 2010-07-27 LAB — LIPID PANEL: Cholesterol: 112 mg/dL (ref 0–200)

## 2010-07-27 LAB — BRAIN NATRIURETIC PEPTIDE: Pro B Natriuretic peptide (BNP): <30 pg/mL (ref 0.0–100.0)

## 2010-07-27 LAB — TSH: TSH: 2.372 u[IU]/mL (ref 0.350–4.500)

## 2010-07-31 LAB — CBC
HCT: 35.9 % — ABNORMAL LOW (ref 39.0–52.0)
HCT: 36.6 % — ABNORMAL LOW (ref 39.0–52.0)
HCT: 36.8 % — ABNORMAL LOW (ref 39.0–52.0)
HCT: 36.8 % — ABNORMAL LOW (ref 39.0–52.0)
Hemoglobin: 12.4 g/dL — ABNORMAL LOW (ref 13.0–17.0)
Hemoglobin: 12.7 g/dL — ABNORMAL LOW (ref 13.0–17.0)
MCHC: 33 g/dL (ref 30.0–36.0)
MCHC: 33.5 g/dL (ref 30.0–36.0)
MCHC: 34 g/dL (ref 30.0–36.0)
MCHC: 34.4 g/dL (ref 30.0–36.0)
MCV: 97.8 fL (ref 78.0–100.0)
MCV: 98.3 fL (ref 78.0–100.0)
MCV: 98.4 fL (ref 78.0–100.0)
MCV: 98.7 fL (ref 78.0–100.0)
MCV: 99.8 fL (ref 78.0–100.0)
Platelets: 174 10*3/uL (ref 150–400)
Platelets: 184 10*3/uL (ref 150–400)
Platelets: 185 10*3/uL (ref 150–400)
RBC: 3.65 MIL/uL — ABNORMAL LOW (ref 4.22–5.81)
RBC: 3.76 MIL/uL — ABNORMAL LOW (ref 4.22–5.81)
RBC: 3.77 MIL/uL — ABNORMAL LOW (ref 4.22–5.81)
RDW: 14.3 % (ref 11.5–15.5)
RDW: 14.5 % (ref 11.5–15.5)
WBC: 13.9 10*3/uL — ABNORMAL HIGH (ref 4.0–10.5)
WBC: 16 10*3/uL — ABNORMAL HIGH (ref 4.0–10.5)
WBC: 20.6 10*3/uL — ABNORMAL HIGH (ref 4.0–10.5)

## 2010-07-31 LAB — CULTURE, RESPIRATORY W GRAM STAIN

## 2010-07-31 LAB — GLUCOSE, CAPILLARY
Glucose-Capillary: 122 mg/dL — ABNORMAL HIGH (ref 70–99)
Glucose-Capillary: 135 mg/dL — ABNORMAL HIGH (ref 70–99)
Glucose-Capillary: 142 mg/dL — ABNORMAL HIGH (ref 70–99)
Glucose-Capillary: 151 mg/dL — ABNORMAL HIGH (ref 70–99)
Glucose-Capillary: 179 mg/dL — ABNORMAL HIGH (ref 70–99)
Glucose-Capillary: 189 mg/dL — ABNORMAL HIGH (ref 70–99)
Glucose-Capillary: 190 mg/dL — ABNORMAL HIGH (ref 70–99)
Glucose-Capillary: 192 mg/dL — ABNORMAL HIGH (ref 70–99)
Glucose-Capillary: 194 mg/dL — ABNORMAL HIGH (ref 70–99)
Glucose-Capillary: 206 mg/dL — ABNORMAL HIGH (ref 70–99)
Glucose-Capillary: 254 mg/dL — ABNORMAL HIGH (ref 70–99)
Glucose-Capillary: 260 mg/dL — ABNORMAL HIGH (ref 70–99)
Glucose-Capillary: 264 mg/dL — ABNORMAL HIGH (ref 70–99)
Glucose-Capillary: 289 mg/dL — ABNORMAL HIGH (ref 70–99)
Glucose-Capillary: 330 mg/dL — ABNORMAL HIGH (ref 70–99)
Glucose-Capillary: 363 mg/dL — ABNORMAL HIGH (ref 70–99)
Glucose-Capillary: 425 mg/dL — ABNORMAL HIGH (ref 70–99)
Glucose-Capillary: 480 mg/dL — ABNORMAL HIGH (ref 70–99)
Glucose-Capillary: 83 mg/dL (ref 70–99)

## 2010-07-31 LAB — GLUCOSE, RANDOM: Glucose, Bld: 473 mg/dL — ABNORMAL HIGH (ref 70–99)

## 2010-07-31 LAB — BASIC METABOLIC PANEL
BUN: 55 mg/dL — ABNORMAL HIGH (ref 6–23)
BUN: 62 mg/dL — ABNORMAL HIGH (ref 6–23)
BUN: 69 mg/dL — ABNORMAL HIGH (ref 6–23)
CO2: 19 mEq/L (ref 19–32)
Calcium: 8.4 mg/dL (ref 8.4–10.5)
Calcium: 8.8 mg/dL (ref 8.4–10.5)
Chloride: 104 mEq/L (ref 96–112)
Chloride: 106 mEq/L (ref 96–112)
Chloride: 107 mEq/L (ref 96–112)
Creatinine, Ser: 2.39 mg/dL — ABNORMAL HIGH (ref 0.4–1.5)
GFR calc Af Amer: 27 mL/min — ABNORMAL LOW (ref >60)
GFR calc Af Amer: 31 mL/min — ABNORMAL LOW (ref >60)
GFR calc non Af Amer: 23 mL/min — ABNORMAL LOW (ref >60)
Glucose, Bld: 227 mg/dL — ABNORMAL HIGH (ref 70–99)
Glucose, Bld: 377 mg/dL — ABNORMAL HIGH (ref 70–99)
Potassium: 4.2 mEq/L (ref 3.5–5.1)
Potassium: 4.3 mEq/L (ref 3.5–5.1)
Potassium: 5.1 mEq/L (ref 3.5–5.1)
Sodium: 137 mEq/L (ref 135–145)
Sodium: 137 mEq/L (ref 135–145)

## 2010-07-31 LAB — POCT I-STAT, CHEM 8
Creatinine, Ser: 2.3 mg/dL — ABNORMAL HIGH (ref 0.4–1.5)
Glucose, Bld: 186 mg/dL — ABNORMAL HIGH (ref 70–99)
HCT: 37 % — ABNORMAL LOW (ref 39.0–52.0)
Hemoglobin: 12.6 g/dL — ABNORMAL LOW (ref 13.0–17.0)
Potassium: 4.2 mEq/L (ref 3.5–5.1)
Sodium: 138 mEq/L (ref 135–145)
TCO2: 27 mmol/L (ref 0–100)

## 2010-07-31 LAB — DIFFERENTIAL
Basophils Absolute: 0 10*3/uL (ref 0.0–0.1)
Basophils Relative: 0 % (ref 0–1)
Eosinophils Absolute: 0 10*3/uL (ref 0.0–0.7)
Eosinophils Absolute: 0.7 10*3/uL (ref 0.0–0.7)
Eosinophils Relative: 5 % (ref 0–5)
Lymphocytes Relative: 11 % — ABNORMAL LOW (ref 12–46)
Lymphs Abs: 0.5 10*3/uL — ABNORMAL LOW (ref 0.7–4.0)
Lymphs Abs: 0.5 10*3/uL — ABNORMAL LOW (ref 0.7–4.0)
Lymphs Abs: 1.5 10*3/uL (ref 0.7–4.0)
Monocytes Absolute: 1.2 10*3/uL — ABNORMAL HIGH (ref 0.1–1.0)
Monocytes Relative: 4 % (ref 3–12)
Monocytes Relative: 4 % (ref 3–12)
Monocytes Relative: 9 % (ref 3–12)
Neutro Abs: 10.4 10*3/uL — ABNORMAL HIGH (ref 1.7–7.7)
Neutro Abs: 14.9 10*3/uL — ABNORMAL HIGH (ref 1.7–7.7)
Neutrophils Relative %: 75 % (ref 43–77)
Neutrophils Relative %: 93 % — ABNORMAL HIGH (ref 43–77)
Neutrophils Relative %: 93 % — ABNORMAL HIGH (ref 43–77)

## 2010-07-31 LAB — COMPREHENSIVE METABOLIC PANEL
AST: 30 U/L (ref 0–37)
Albumin: 3.3 g/dL — ABNORMAL LOW (ref 3.5–5.2)
BUN: 35 mg/dL — ABNORMAL HIGH (ref 6–23)
BUN: 56 mg/dL — ABNORMAL HIGH (ref 6–23)
CO2: 24 mEq/L (ref 19–32)
Calcium: 8.9 mg/dL (ref 8.4–10.5)
Calcium: 8.9 mg/dL (ref 8.4–10.5)
Creatinine, Ser: 1.94 mg/dL — ABNORMAL HIGH (ref 0.4–1.5)
GFR calc Af Amer: 40 mL/min — ABNORMAL LOW (ref >60)
GFR calc non Af Amer: 33 mL/min — ABNORMAL LOW (ref >60)
Glucose, Bld: 107 mg/dL — ABNORMAL HIGH (ref 70–99)
Glucose, Bld: 267 mg/dL — ABNORMAL HIGH (ref 70–99)
Sodium: 140 mEq/L (ref 135–145)
Total Protein: 6.7 g/dL (ref 6.0–8.3)

## 2010-07-31 LAB — POCT I-STAT 3, VENOUS BLOOD GAS (G3P V)
Acid-Base Excess: 3 mmol/L — ABNORMAL HIGH (ref 0.0–2.0)
O2 Saturation: 81 %
pO2, Ven: 44 mmHg (ref 30.0–45.0)

## 2010-07-31 LAB — HEPARIN LEVEL (UNFRACTIONATED)
Heparin Unfractionated: 1.63 IU/mL — ABNORMAL HIGH (ref 0.30–0.70)
Heparin Unfractionated: >2 IU/mL — ABNORMAL HIGH (ref 0.30–0.70)

## 2010-07-31 LAB — APTT
aPTT: 132 seconds — ABNORMAL HIGH (ref 24–37)
aPTT: 25 seconds (ref 24–37)

## 2010-07-31 LAB — URINALYSIS, ROUTINE W REFLEX MICROSCOPIC
Ketones, ur: NEGATIVE mg/dL
Nitrite: NEGATIVE
Protein, ur: NEGATIVE mg/dL
pH: 5 (ref 5.0–8.0)

## 2010-07-31 LAB — GRAM STAIN

## 2010-07-31 LAB — BLOOD GAS, ARTERIAL
Expiratory PAP: 5
Inspiratory PAP: 12
pCO2 arterial: 39.2 mmHg (ref 35.0–45.0)
pH, Arterial: 7.289 — ABNORMAL LOW (ref 7.350–7.450)
pO2, Arterial: 220 mmHg — ABNORMAL HIGH (ref 80.0–100.0)

## 2010-07-31 LAB — BRAIN NATRIURETIC PEPTIDE
Pro B Natriuretic peptide (BNP): 109 pg/mL — ABNORMAL HIGH (ref 0.0–100.0)
Pro B Natriuretic peptide (BNP): 164 pg/mL — ABNORMAL HIGH (ref 0.0–100.0)

## 2010-07-31 LAB — MAGNESIUM: Magnesium: 2.5 mg/dL (ref 1.5–2.5)

## 2010-07-31 LAB — PROTIME-INR
INR: 0.99 (ref 0.00–1.49)
Prothrombin Time: 13 seconds (ref 11.6–15.2)

## 2010-07-31 LAB — POCT CARDIAC MARKERS
CKMB, poc: 2.9 ng/mL (ref 1.0–8.0)
Myoglobin, poc: 469 ng/mL (ref 12–200)

## 2010-08-01 LAB — CK TOTAL AND CKMB (NOT AT ARMC)
CK, MB: 1.2 ng/mL (ref 0.3–4.0)
Relative Index: INVALID (ref 0.0–2.5)
Total CK: 26 U/L (ref 7–232)

## 2010-08-01 LAB — POCT CARDIAC MARKERS
CKMB, poc: 1.2 ng/mL (ref 1.0–8.0)
Troponin i, poc: <0.05 ng/mL (ref 0.00–0.09)
Troponin i, poc: <0.05 ng/mL (ref 0.00–0.09)

## 2010-08-01 LAB — CULTURE, BLOOD (ROUTINE X 2): Culture: NO GROWTH

## 2010-08-01 LAB — DIFFERENTIAL
Basophils Absolute: 0 10*3/uL (ref 0.0–0.1)
Basophils Relative: 0 % (ref 0–1)
Eosinophils Relative: 6 % — ABNORMAL HIGH (ref 0–5)
Lymphocytes Relative: 36 % (ref 12–46)
Neutro Abs: 7.1 10*3/uL (ref 1.7–7.7)

## 2010-08-01 LAB — POCT I-STAT 3, ART BLOOD GAS (G3+)
Acid-Base Excess: 5 mmol/L — ABNORMAL HIGH (ref 0.0–2.0)
pO2, Arterial: 76 mmHg — ABNORMAL LOW (ref 80.0–100.0)

## 2010-08-01 LAB — URINALYSIS, ROUTINE W REFLEX MICROSCOPIC
Bilirubin Urine: NEGATIVE
Ketones, ur: NEGATIVE mg/dL
Nitrite: NEGATIVE
Specific Gravity, Urine: 1.011 (ref 1.005–1.030)
Urobilinogen, UA: 1 mg/dL (ref 0.0–1.0)

## 2010-08-01 LAB — BASIC METABOLIC PANEL
Calcium: 8.4 mg/dL (ref 8.4–10.5)
GFR calc Af Amer: 53 mL/min — ABNORMAL LOW (ref >60)
GFR calc non Af Amer: 44 mL/min — ABNORMAL LOW (ref >60)
Sodium: 142 mEq/L (ref 135–145)

## 2010-08-01 LAB — CBC
Hemoglobin: 13 g/dL (ref 13.0–17.0)
RBC: 4.3 MIL/uL (ref 4.22–5.81)
RDW: 15 % (ref 11.5–15.5)

## 2010-08-01 LAB — PROCALCITONIN: Procalcitonin: <0.5 ng/mL

## 2010-08-01 LAB — LACTIC ACID, PLASMA: Lactic Acid, Venous: 2.2 mmol/L (ref 0.5–2.2)

## 2010-08-01 LAB — TROPONIN I: Troponin I: 0.01 ng/mL (ref 0.00–0.06)

## 2010-08-03 LAB — BASIC METABOLIC PANEL
BUN: 26 mg/dL — ABNORMAL HIGH (ref 6–23)
BUN: 32 mg/dL — ABNORMAL HIGH (ref 6–23)
BUN: 40 mg/dL — ABNORMAL HIGH (ref 6–23)
BUN: 44 mg/dL — ABNORMAL HIGH (ref 6–23)
BUN: 50 mg/dL — ABNORMAL HIGH (ref 6–23)
BUN: 21 mg/dL (ref 6–23)
BUN: 32 mg/dL — ABNORMAL HIGH (ref 6–23)
BUN: 45 mg/dL — ABNORMAL HIGH (ref 6–23)
BUN: 45 mg/dL — ABNORMAL HIGH (ref 6–23)
BUN: 49 mg/dL — ABNORMAL HIGH (ref 6–23)
CO2: 25 mEq/L (ref 19–32)
CO2: 26 mEq/L (ref 19–32)
CO2: 26 mEq/L (ref 19–32)
CO2: 27 mEq/L (ref 19–32)
CO2: 29 mEq/L (ref 19–32)
Calcium: 7.3 mg/dL — ABNORMAL LOW (ref 8.4–10.5)
Calcium: 8.1 mg/dL — ABNORMAL LOW (ref 8.4–10.5)
Calcium: 8.2 mg/dL — ABNORMAL LOW (ref 8.4–10.5)
Calcium: 7.8 mg/dL — ABNORMAL LOW (ref 8.4–10.5)
Calcium: 8 mg/dL — ABNORMAL LOW (ref 8.4–10.5)
Calcium: 8.1 mg/dL — ABNORMAL LOW (ref 8.4–10.5)
Chloride: 106 mEq/L (ref 96–112)
Chloride: 112 mEq/L (ref 96–112)
Chloride: 112 mEq/L (ref 96–112)
Chloride: 103 mEq/L (ref 96–112)
Chloride: 104 mEq/L (ref 96–112)
Chloride: 106 mEq/L (ref 96–112)
Chloride: 107 mEq/L (ref 96–112)
Chloride: 112 mEq/L (ref 96–112)
Creatinine, Ser: 1.39 mg/dL (ref 0.4–1.5)
Creatinine, Ser: 1.56 mg/dL — ABNORMAL HIGH (ref 0.4–1.5)
Creatinine, Ser: 1.58 mg/dL — ABNORMAL HIGH (ref 0.4–1.5)
Creatinine, Ser: 1.52 mg/dL — ABNORMAL HIGH (ref 0.4–1.5)
Creatinine, Ser: 1.54 mg/dL — ABNORMAL HIGH (ref 0.4–1.5)
Creatinine, Ser: 1.97 mg/dL — ABNORMAL HIGH (ref 0.4–1.5)
GFR calc Af Amer: 51 mL/min — ABNORMAL LOW (ref >60)
GFR calc Af Amer: 39 mL/min — ABNORMAL LOW (ref >60)
GFR calc Af Amer: 44 mL/min — ABNORMAL LOW (ref >60)
GFR calc Af Amer: 52 mL/min — ABNORMAL LOW (ref >60)
GFR calc Af Amer: 52 mL/min — ABNORMAL LOW (ref >60)
GFR calc Af Amer: 53 mL/min — ABNORMAL LOW (ref >60)
GFR calc non Af Amer: 42 mL/min — ABNORMAL LOW (ref >60)
GFR calc non Af Amer: 47 mL/min — ABNORMAL LOW (ref >60)
GFR calc non Af Amer: 48 mL/min — ABNORMAL LOW (ref >60)
GFR calc non Af Amer: 36 mL/min — ABNORMAL LOW (ref >60)
GFR calc non Af Amer: 44 mL/min — ABNORMAL LOW (ref >60)
Glucose, Bld: 123 mg/dL — ABNORMAL HIGH (ref 70–99)
Glucose, Bld: 38 mg/dL — CL (ref 70–99)
Glucose, Bld: 99 mg/dL (ref 70–99)
Glucose, Bld: 350 mg/dL — ABNORMAL HIGH (ref 70–99)
Glucose, Bld: 81 mg/dL (ref 70–99)
Potassium: 3.6 mEq/L (ref 3.5–5.1)
Potassium: 4.1 mEq/L (ref 3.5–5.1)
Potassium: 4.4 mEq/L (ref 3.5–5.1)
Potassium: 3.7 mEq/L (ref 3.5–5.1)
Potassium: 4 mEq/L (ref 3.5–5.1)
Potassium: 4 mEq/L (ref 3.5–5.1)
Potassium: 4.6 mEq/L (ref 3.5–5.1)
Sodium: 143 mEq/L (ref 135–145)
Sodium: 144 mEq/L (ref 135–145)
Sodium: 146 mEq/L — ABNORMAL HIGH (ref 135–145)
Sodium: 139 mEq/L (ref 135–145)
Sodium: 143 mEq/L (ref 135–145)
Sodium: 147 mEq/L — ABNORMAL HIGH (ref 135–145)

## 2010-08-03 LAB — COMPREHENSIVE METABOLIC PANEL
ALT: 30 U/L (ref 0–53)
ALT: 42 U/L (ref 0–53)
AST: 25 U/L (ref 0–37)
Albumin: 2.6 g/dL — ABNORMAL LOW (ref 3.5–5.2)
Albumin: 2.4 g/dL — ABNORMAL LOW (ref 3.5–5.2)
CO2: 27 mEq/L (ref 19–32)
Calcium: 8.4 mg/dL (ref 8.4–10.5)
Calcium: 8.5 mg/dL (ref 8.4–10.5)
Calcium: 8 mg/dL — ABNORMAL LOW (ref 8.4–10.5)
Chloride: 106 mEq/L (ref 96–112)
Creatinine, Ser: 1.82 mg/dL — ABNORMAL HIGH (ref 0.4–1.5)
GFR calc Af Amer: 43 mL/min — ABNORMAL LOW (ref >60)
GFR calc Af Amer: 46 mL/min — ABNORMAL LOW (ref >60)
GFR calc non Af Amer: 49 mL/min — ABNORMAL LOW (ref >60)
Glucose, Bld: 236 mg/dL — ABNORMAL HIGH (ref 70–99)
Glucose, Bld: 288 mg/dL — ABNORMAL HIGH (ref 70–99)
Potassium: 4.8 mEq/L (ref 3.5–5.1)
Sodium: 141 mEq/L (ref 135–145)
Sodium: 141 mEq/L (ref 135–145)
Total Bilirubin: 0.5 mg/dL (ref 0.3–1.2)
Total Protein: 5 g/dL — ABNORMAL LOW (ref 6.0–8.3)
Total Protein: 5.1 g/dL — ABNORMAL LOW (ref 6.0–8.3)

## 2010-08-03 LAB — GLUCOSE, CAPILLARY
Glucose-Capillary: 107 mg/dL — ABNORMAL HIGH (ref 70–99)
Glucose-Capillary: 119 mg/dL — ABNORMAL HIGH (ref 70–99)
Glucose-Capillary: 122 mg/dL — ABNORMAL HIGH (ref 70–99)
Glucose-Capillary: 128 mg/dL — ABNORMAL HIGH (ref 70–99)
Glucose-Capillary: 136 mg/dL — ABNORMAL HIGH (ref 70–99)
Glucose-Capillary: 145 mg/dL — ABNORMAL HIGH (ref 70–99)
Glucose-Capillary: 149 mg/dL — ABNORMAL HIGH (ref 70–99)
Glucose-Capillary: 154 mg/dL — ABNORMAL HIGH (ref 70–99)
Glucose-Capillary: 163 mg/dL — ABNORMAL HIGH (ref 70–99)
Glucose-Capillary: 166 mg/dL — ABNORMAL HIGH (ref 70–99)
Glucose-Capillary: 170 mg/dL — ABNORMAL HIGH (ref 70–99)
Glucose-Capillary: 173 mg/dL — ABNORMAL HIGH (ref 70–99)
Glucose-Capillary: 181 mg/dL — ABNORMAL HIGH (ref 70–99)
Glucose-Capillary: 183 mg/dL — ABNORMAL HIGH (ref 70–99)
Glucose-Capillary: 186 mg/dL — ABNORMAL HIGH (ref 70–99)
Glucose-Capillary: 206 mg/dL — ABNORMAL HIGH (ref 70–99)
Glucose-Capillary: 214 mg/dL — ABNORMAL HIGH (ref 70–99)
Glucose-Capillary: 217 mg/dL — ABNORMAL HIGH (ref 70–99)
Glucose-Capillary: 230 mg/dL — ABNORMAL HIGH (ref 70–99)
Glucose-Capillary: 252 mg/dL — ABNORMAL HIGH (ref 70–99)
Glucose-Capillary: 277 mg/dL — ABNORMAL HIGH (ref 70–99)
Glucose-Capillary: 289 mg/dL — ABNORMAL HIGH (ref 70–99)
Glucose-Capillary: 32 mg/dL — CL (ref 70–99)
Glucose-Capillary: 335 mg/dL — ABNORMAL HIGH (ref 70–99)
Glucose-Capillary: 63 mg/dL — ABNORMAL LOW (ref 70–99)
Glucose-Capillary: 85 mg/dL (ref 70–99)
Glucose-Capillary: 90 mg/dL (ref 70–99)
Glucose-Capillary: 125 mg/dL — ABNORMAL HIGH (ref 70–99)
Glucose-Capillary: 127 mg/dL — ABNORMAL HIGH (ref 70–99)
Glucose-Capillary: 163 mg/dL — ABNORMAL HIGH (ref 70–99)
Glucose-Capillary: 186 mg/dL — ABNORMAL HIGH (ref 70–99)
Glucose-Capillary: 212 mg/dL — ABNORMAL HIGH (ref 70–99)
Glucose-Capillary: 321 mg/dL — ABNORMAL HIGH (ref 70–99)
Glucose-Capillary: 324 mg/dL — ABNORMAL HIGH (ref 70–99)
Glucose-Capillary: 336 mg/dL — ABNORMAL HIGH (ref 70–99)
Glucose-Capillary: 341 mg/dL — ABNORMAL HIGH (ref 70–99)
Glucose-Capillary: 345 mg/dL — ABNORMAL HIGH (ref 70–99)
Glucose-Capillary: 350 mg/dL — ABNORMAL HIGH (ref 70–99)

## 2010-08-03 LAB — URINE CULTURE
Colony Count: NO GROWTH
Culture: NO GROWTH

## 2010-08-03 LAB — DIFFERENTIAL
Basophils Absolute: 0 10*3/uL (ref 0.0–0.1)
Basophils Relative: 0 % (ref 0–1)
Basophils Relative: 0 % (ref 0–1)
Blasts: 0 %
Eosinophils Absolute: 0 10*3/uL (ref 0.0–0.7)
Eosinophils Relative: 0 % (ref 0–5)
Eosinophils Relative: 0 % (ref 0–5)
Eosinophils Relative: 0 % (ref 0–5)
Lymphocytes Relative: 4 % — ABNORMAL LOW (ref 12–46)
Lymphocytes Relative: 2 % — ABNORMAL LOW (ref 12–46)
Lymphs Abs: 0.6 10*3/uL — ABNORMAL LOW (ref 0.7–4.0)
Lymphs Abs: 0.8 10*3/uL (ref 0.7–4.0)
Lymphs Abs: 0.3 10*3/uL — ABNORMAL LOW (ref 0.7–4.0)
Metamyelocytes Relative: 0 %
Monocytes Absolute: 1.1 10*3/uL — ABNORMAL HIGH (ref 0.1–1.0)
Monocytes Absolute: 1.4 10*3/uL — ABNORMAL HIGH (ref 0.1–1.0)
Monocytes Absolute: 0.2 10*3/uL (ref 0.1–1.0)
Monocytes Relative: 8 % (ref 3–12)
Monocytes Relative: 1 % — ABNORMAL LOW (ref 3–12)
Myelocytes: 0 %
Neutro Abs: 26.2 10*3/uL — ABNORMAL HIGH (ref 1.7–7.7)
Neutro Abs: 17.1 10*3/uL — ABNORMAL HIGH (ref 1.7–7.7)
Neutrophils Relative %: 97 % — ABNORMAL HIGH (ref 43–77)
Promyelocytes Absolute: 0 %
nRBC: 0 /100 WBC

## 2010-08-03 LAB — PROTIME-INR
INR: 1.72 — ABNORMAL HIGH (ref 0.00–1.49)
INR: 1.86 — ABNORMAL HIGH (ref 0.00–1.49)
INR: 2.19 — ABNORMAL HIGH (ref 0.00–1.49)
INR: 2.23 — ABNORMAL HIGH (ref 0.00–1.49)
INR: 2.38 — ABNORMAL HIGH (ref 0.00–1.49)
INR: 2.91 — ABNORMAL HIGH (ref 0.00–1.49)
INR: 3.12 — ABNORMAL HIGH (ref 0.00–1.49)
INR: 2.62 — ABNORMAL HIGH (ref 0.00–1.49)
INR: 3.66 — ABNORMAL HIGH (ref 0.00–1.49)
INR: 3.66 — ABNORMAL HIGH (ref 0.00–1.49)
Prothrombin Time: 13.3 seconds (ref 11.6–15.2)
Prothrombin Time: 17.2 seconds — ABNORMAL HIGH (ref 11.6–15.2)
Prothrombin Time: 20 seconds — ABNORMAL HIGH (ref 11.6–15.2)
Prothrombin Time: 20.4 seconds — ABNORMAL HIGH (ref 11.6–15.2)
Prothrombin Time: 21.3 seconds — ABNORMAL HIGH (ref 11.6–15.2)
Prothrombin Time: 24.2 seconds — ABNORMAL HIGH (ref 11.6–15.2)
Prothrombin Time: 24.5 seconds — ABNORMAL HIGH (ref 11.6–15.2)
Prothrombin Time: 25.8 seconds — ABNORMAL HIGH (ref 11.6–15.2)
Prothrombin Time: 30.2 seconds — ABNORMAL HIGH (ref 11.6–15.2)
Prothrombin Time: 31.9 seconds — ABNORMAL HIGH (ref 11.6–15.2)
Prothrombin Time: 27.8 seconds — ABNORMAL HIGH (ref 11.6–15.2)
Prothrombin Time: 36.1 seconds — ABNORMAL HIGH (ref 11.6–15.2)
Prothrombin Time: 36.1 seconds — ABNORMAL HIGH (ref 11.6–15.2)

## 2010-08-03 LAB — WOUND CULTURE

## 2010-08-03 LAB — CK TOTAL AND CKMB (NOT AT ARMC)
CK, MB: 1.8 ng/mL (ref 0.3–4.0)
CK, MB: 2.3 ng/mL (ref 0.3–4.0)
Relative Index: INVALID (ref 0.0–2.5)
Total CK: 67 U/L (ref 7–232)
Total CK: 71 U/L (ref 7–232)

## 2010-08-03 LAB — PHOSPHORUS: Phosphorus: 2.7 mg/dL (ref 2.3–4.6)

## 2010-08-03 LAB — EXPECTORATED SPUTUM ASSESSMENT W GRAM STAIN, RFLX TO RESP C

## 2010-08-03 LAB — HEPARIN LEVEL (UNFRACTIONATED)
Heparin Unfractionated: 0.33 IU/mL (ref 0.30–0.70)
Heparin Unfractionated: 0.35 IU/mL (ref 0.30–0.70)
Heparin Unfractionated: 0.59 IU/mL (ref 0.30–0.70)
Heparin Unfractionated: 0.71 IU/mL — ABNORMAL HIGH (ref 0.30–0.70)

## 2010-08-03 LAB — CBC
HCT: 33.4 % — ABNORMAL LOW (ref 39.0–52.0)
HCT: 34.8 % — ABNORMAL LOW (ref 39.0–52.0)
HCT: 35 % — ABNORMAL LOW (ref 39.0–52.0)
HCT: 36.6 % — ABNORMAL LOW (ref 39.0–52.0)
HCT: 30.9 % — ABNORMAL LOW (ref 39.0–52.0)
HCT: 30.9 % — ABNORMAL LOW (ref 39.0–52.0)
HCT: 31.9 % — ABNORMAL LOW (ref 39.0–52.0)
HCT: 32.2 % — ABNORMAL LOW (ref 39.0–52.0)
Hemoglobin: 11.1 g/dL — ABNORMAL LOW (ref 13.0–17.0)
Hemoglobin: 11.6 g/dL — ABNORMAL LOW (ref 13.0–17.0)
Hemoglobin: 11.7 g/dL — ABNORMAL LOW (ref 13.0–17.0)
Hemoglobin: 11.7 g/dL — ABNORMAL LOW (ref 13.0–17.0)
Hemoglobin: 13.1 g/dL (ref 13.0–17.0)
Hemoglobin: 10.3 g/dL — ABNORMAL LOW (ref 13.0–17.0)
Hemoglobin: 10.4 g/dL — ABNORMAL LOW (ref 13.0–17.0)
Hemoglobin: 10.6 g/dL — ABNORMAL LOW (ref 13.0–17.0)
Hemoglobin: 10.6 g/dL — ABNORMAL LOW (ref 13.0–17.0)
MCHC: 32.9 g/dL (ref 30.0–36.0)
MCHC: 33 g/dL (ref 30.0–36.0)
MCHC: 33.2 g/dL (ref 30.0–36.0)
MCHC: 33.4 g/dL (ref 30.0–36.0)
MCHC: 33.5 g/dL (ref 30.0–36.0)
MCHC: 33.6 g/dL (ref 30.0–36.0)
MCHC: 32.8 g/dL (ref 30.0–36.0)
MCHC: 33 g/dL (ref 30.0–36.0)
MCHC: 33.7 g/dL (ref 30.0–36.0)
MCV: 100.1 fL — ABNORMAL HIGH (ref 78.0–100.0)
MCV: 100.7 fL — ABNORMAL HIGH (ref 78.0–100.0)
MCV: 101.1 fL — ABNORMAL HIGH (ref 78.0–100.0)
MCV: 99.6 fL (ref 78.0–100.0)
MCV: 99.8 fL (ref 78.0–100.0)
MCV: 97.1 fL (ref 78.0–100.0)
MCV: 97.8 fL (ref 78.0–100.0)
MCV: 98.3 fL (ref 78.0–100.0)
MCV: 98.4 fL (ref 78.0–100.0)
MCV: 99.1 fL (ref 78.0–100.0)
Platelets: 134 10*3/uL — ABNORMAL LOW (ref 150–400)
Platelets: 138 10*3/uL — ABNORMAL LOW (ref 150–400)
Platelets: 139 10*3/uL — ABNORMAL LOW (ref 150–400)
Platelets: 139 10*3/uL — ABNORMAL LOW (ref 150–400)
Platelets: 163 10*3/uL (ref 150–400)
Platelets: 198 10*3/uL (ref 150–400)
Platelets: 149 10*3/uL — ABNORMAL LOW (ref 150–400)
Platelets: 154 10*3/uL (ref 150–400)
Platelets: 157 10*3/uL (ref 150–400)
Platelets: 159 10*3/uL (ref 150–400)
Platelets: 161 10*3/uL (ref 150–400)
RBC: 3.32 MIL/uL — ABNORMAL LOW (ref 4.22–5.81)
RBC: 3.49 MIL/uL — ABNORMAL LOW (ref 4.22–5.81)
RBC: 3.55 MIL/uL — ABNORMAL LOW (ref 4.22–5.81)
RBC: 3.96 MIL/uL — ABNORMAL LOW (ref 4.22–5.81)
RBC: 3.11 MIL/uL — ABNORMAL LOW (ref 4.22–5.81)
RBC: 3.14 MIL/uL — ABNORMAL LOW (ref 4.22–5.81)
RBC: 3.24 MIL/uL — ABNORMAL LOW (ref 4.22–5.81)
RBC: 3.25 MIL/uL — ABNORMAL LOW (ref 4.22–5.81)
RDW: 14.9 % (ref 11.5–15.5)
RDW: 15 % (ref 11.5–15.5)
RDW: 15 % (ref 11.5–15.5)
RDW: 15 % (ref 11.5–15.5)
RDW: 15.2 % (ref 11.5–15.5)
RDW: 14.7 % (ref 11.5–15.5)
RDW: 15.1 % (ref 11.5–15.5)
WBC: 15.3 10*3/uL — ABNORMAL HIGH (ref 4.0–10.5)
WBC: 17.9 10*3/uL — ABNORMAL HIGH (ref 4.0–10.5)
WBC: 30.4 10*3/uL — ABNORMAL HIGH (ref 4.0–10.5)
WBC: 31 10*3/uL — ABNORMAL HIGH (ref 4.0–10.5)
WBC: 12.7 10*3/uL — ABNORMAL HIGH (ref 4.0–10.5)
WBC: 15.6 10*3/uL — ABNORMAL HIGH (ref 4.0–10.5)
WBC: 16.8 10*3/uL — ABNORMAL HIGH (ref 4.0–10.5)
WBC: 17.6 10*3/uL — ABNORMAL HIGH (ref 4.0–10.5)
WBC: 9.3 10*3/uL (ref 4.0–10.5)

## 2010-08-03 LAB — LIPID PANEL
Cholesterol: 135 mg/dL (ref 0–200)
HDL: 46 mg/dL (ref >39)
LDL Cholesterol: 69 mg/dL (ref 0–99)
Total CHOL/HDL Ratio: 2.9 RATIO
Triglycerides: 99 mg/dL (ref <150)
VLDL: 20 mg/dL (ref 0–40)

## 2010-08-03 LAB — CARDIAC PANEL(CRET KIN+CKTOT+MB+TROPI)
CK, MB: 1.7 ng/mL (ref 0.3–4.0)
CK, MB: 2.1 ng/mL (ref 0.3–4.0)
Relative Index: 1.6 (ref 0.0–2.5)
Relative Index: 1.7 (ref 0.0–2.5)
Total CK: 100 U/L (ref 7–232)
Total CK: 130 U/L (ref 7–232)
Troponin I: 0.04 ng/mL (ref 0.00–0.06)
Troponin I: 0.04 ng/mL (ref 0.00–0.06)

## 2010-08-03 LAB — CHOLESTEROL, TOTAL: Cholesterol: 156 mg/dL (ref 0–200)

## 2010-08-03 LAB — CULTURE, BLOOD (ROUTINE X 2): Culture: NO GROWTH

## 2010-08-03 LAB — TRIGLYCERIDES: Triglycerides: 163 mg/dL — ABNORMAL HIGH (ref <150)

## 2010-08-03 LAB — TROPONIN I: Troponin I: 0.04 ng/mL (ref 0.00–0.06)

## 2010-08-03 LAB — CULTURE, RESPIRATORY W GRAM STAIN

## 2010-08-03 LAB — TSH: TSH: 1.034 u[IU]/mL (ref 0.350–4.500)

## 2010-08-03 LAB — BRAIN NATRIURETIC PEPTIDE: Pro B Natriuretic peptide (BNP): 352 pg/mL — ABNORMAL HIGH (ref 0.0–100.0)

## 2010-08-05 ENCOUNTER — Ambulatory Visit (INDEPENDENT_AMBULATORY_CARE_PROVIDER_SITE_OTHER): Payer: Medicare Other | Admitting: *Deleted

## 2010-08-05 DIAGNOSIS — I82409 Acute embolism and thrombosis of unspecified deep veins of unspecified lower extremity: Secondary | ICD-10-CM

## 2010-08-05 DIAGNOSIS — I2699 Other pulmonary embolism without acute cor pulmonale: Secondary | ICD-10-CM

## 2010-08-05 DIAGNOSIS — Z7901 Long term (current) use of anticoagulants: Secondary | ICD-10-CM

## 2010-08-05 DIAGNOSIS — I4891 Unspecified atrial fibrillation: Secondary | ICD-10-CM

## 2010-08-05 NOTE — Patient Instructions (Signed)
INR 2.4  Continue current dosage: 1 tablet (1mg ) every day, except for 2 tablets (2mg ) on Monday, Wednesday and Friday Recheck INR in 4 weeks

## 2010-08-08 ENCOUNTER — Other Ambulatory Visit: Payer: Self-pay | Admitting: Internal Medicine

## 2010-08-10 ENCOUNTER — Other Ambulatory Visit: Payer: Self-pay | Admitting: Pharmacist

## 2010-08-10 DIAGNOSIS — I4891 Unspecified atrial fibrillation: Secondary | ICD-10-CM

## 2010-08-10 MED ORDER — WARFARIN SODIUM 1 MG PO TABS: 1.0000 mg | ORAL_TABLET | ORAL | Status: DC

## 2010-08-24 LAB — URINALYSIS, ROUTINE W REFLEX MICROSCOPIC
Ketones, ur: NEGATIVE mg/dL
Leukocytes, UA: NEGATIVE
Nitrite: NEGATIVE
Protein, ur: NEGATIVE mg/dL
pH: 5 (ref 5.0–8.0)

## 2010-08-24 LAB — CBC
HCT: 44.5 % (ref 39.0–52.0)
Hemoglobin: 13.1 g/dL (ref 13.0–17.0)
MCHC: 29.6 g/dL — ABNORMAL LOW (ref 30.0–36.0)
MCV: 98.5 fL (ref 78.0–100.0)
RBC: 4.51 MIL/uL (ref 4.22–5.81)

## 2010-08-24 LAB — COMPREHENSIVE METABOLIC PANEL
ALT: 30 U/L (ref 0–53)
BUN: 30 mg/dL — ABNORMAL HIGH (ref 6–23)
CO2: 29 mEq/L (ref 19–32)
Calcium: 8.8 mg/dL (ref 8.4–10.5)
Creatinine, Ser: 2.08 mg/dL — ABNORMAL HIGH (ref 0.4–1.5)
GFR calc non Af Amer: 31 mL/min — ABNORMAL LOW (ref >60)
Glucose, Bld: 143 mg/dL — ABNORMAL HIGH (ref 70–99)

## 2010-08-24 LAB — DIFFERENTIAL
Eosinophils Absolute: 0.4 10*3/uL (ref 0.0–0.7)
Lymphocytes Relative: 10 % — ABNORMAL LOW (ref 12–46)
Lymphs Abs: 1.3 10*3/uL (ref 0.7–4.0)
Neutro Abs: 9.7 10*3/uL — ABNORMAL HIGH (ref 1.7–7.7)
Neutrophils Relative %: 78 % — ABNORMAL HIGH (ref 43–77)

## 2010-08-24 LAB — URINE MICROSCOPIC-ADD ON

## 2010-08-24 LAB — POCT CARDIAC MARKERS: Troponin i, poc: <0.05 ng/mL (ref 0.00–0.09)

## 2010-08-24 LAB — LIPASE, BLOOD: Lipase: 19 U/L (ref 11–59)

## 2010-08-25 LAB — CBC
HCT: 39 % (ref 39.0–52.0)
Hemoglobin: 12.3 g/dL — ABNORMAL LOW (ref 13.0–17.0)
Hemoglobin: 13.3 g/dL (ref 13.0–17.0)
MCHC: 33.8 g/dL (ref 30.0–36.0)
MCV: 97.2 fL (ref 78.0–100.0)
RDW: 15.7 % — ABNORMAL HIGH (ref 11.5–15.5)
WBC: 11.5 10*3/uL — ABNORMAL HIGH (ref 4.0–10.5)

## 2010-08-25 LAB — BASIC METABOLIC PANEL
CO2: 28 mEq/L (ref 19–32)
Calcium: 8.8 mg/dL (ref 8.4–10.5)
Chloride: 104 mEq/L (ref 96–112)
Creatinine, Ser: 1.76 mg/dL — ABNORMAL HIGH (ref 0.4–1.5)
Glucose, Bld: 107 mg/dL — ABNORMAL HIGH (ref 70–99)
Sodium: 141 mEq/L (ref 135–145)

## 2010-08-25 LAB — DIFFERENTIAL
Eosinophils Relative: 8 % — ABNORMAL HIGH (ref 0–5)
Lymphocytes Relative: 15 % (ref 12–46)
Lymphs Abs: 1.7 10*3/uL (ref 0.7–4.0)
Monocytes Absolute: 0.8 10*3/uL (ref 0.1–1.0)
Monocytes Relative: 7 % (ref 3–12)

## 2010-08-25 LAB — MAGNESIUM: Magnesium: 2.2 mg/dL (ref 1.5–2.5)

## 2010-08-25 LAB — COMPREHENSIVE METABOLIC PANEL
ALT: 28 U/L (ref 0–53)
CO2: 28 mEq/L (ref 19–32)
Calcium: 8.7 mg/dL (ref 8.4–10.5)
Creatinine, Ser: 1.77 mg/dL — ABNORMAL HIGH (ref 0.4–1.5)
GFR calc non Af Amer: 37 mL/min — ABNORMAL LOW (ref >60)
Glucose, Bld: 212 mg/dL — ABNORMAL HIGH (ref 70–99)

## 2010-08-25 LAB — URINALYSIS, ROUTINE W REFLEX MICROSCOPIC
Bilirubin Urine: NEGATIVE
Glucose, UA: NEGATIVE mg/dL
Hgb urine dipstick: NEGATIVE
Ketones, ur: NEGATIVE mg/dL
pH: 6.5 (ref 5.0–8.0)

## 2010-08-25 LAB — GLUCOSE, CAPILLARY

## 2010-08-25 LAB — HEMOGLOBIN A1C: Hgb A1c MFr Bld: 7 % — ABNORMAL HIGH (ref 4.6–6.1)

## 2010-08-25 LAB — POCT I-STAT, CHEM 8
BUN: 30 mg/dL — ABNORMAL HIGH (ref 6–23)
Calcium, Ion: 1.18 mmol/L (ref 1.12–1.32)
Creatinine, Ser: 2 mg/dL — ABNORMAL HIGH (ref 0.4–1.5)
TCO2: 28 mmol/L (ref 0–100)

## 2010-08-25 LAB — PROTIME-INR: Prothrombin Time: 13.6 seconds (ref 11.6–15.2)

## 2010-08-25 LAB — LIPID PANEL
Cholesterol: 141 mg/dL (ref 0–200)
HDL: 32 mg/dL — ABNORMAL LOW (ref >39)
Triglycerides: 151 mg/dL — ABNORMAL HIGH (ref <150)

## 2010-08-29 ENCOUNTER — Telehealth: Payer: Self-pay | Admitting: Internal Medicine

## 2010-08-29 MED ORDER — CLONAZEPAM 0.5 MG PO TABS: 0.5000 mg | ORAL_TABLET | Freq: Every evening | ORAL | Status: DC | PRN

## 2010-08-29 NOTE — Telephone Encounter (Signed)
Pt last seen by CDY on 10/21/2009. There are no pending appts. Pls advise if okay to send refill to pharmacy. Clonazepam 0.5 mg, 1 by mouth at bedtime as needed for sleep. Allergies  Allergen Reactions  . Ibuprofen     REACTION: causes nausea and vomitting

## 2010-08-29 NOTE — Telephone Encounter (Signed)
Ok to refill x 5 per CDY.

## 2010-08-29 NOTE — Telephone Encounter (Signed)
RX sent. Pt's spouse is aware.

## 2010-09-02 ENCOUNTER — Ambulatory Visit (INDEPENDENT_AMBULATORY_CARE_PROVIDER_SITE_OTHER): Payer: Medicare Other | Admitting: *Deleted

## 2010-09-02 DIAGNOSIS — I4891 Unspecified atrial fibrillation: Secondary | ICD-10-CM

## 2010-09-02 DIAGNOSIS — I82409 Acute embolism and thrombosis of unspecified deep veins of unspecified lower extremity: Secondary | ICD-10-CM

## 2010-09-02 DIAGNOSIS — I2699 Other pulmonary embolism without acute cor pulmonale: Secondary | ICD-10-CM

## 2010-09-21 ENCOUNTER — Telehealth: Payer: Self-pay | Admitting: Internal Medicine

## 2010-09-21 MED ORDER — PROMETHAZINE-CODEINE 6.25-10 MG/5ML PO SYRP: 5.0000 mL | ORAL_SOLUTION | ORAL | Status: AC | PRN

## 2010-09-21 NOTE — Telephone Encounter (Signed)
Pt c/o a dry hacky cough and he needs a refill on his promethazine w/ codeine. Last refill given 06/23/2010 w/ 1 additional refill. Last OV was on 11/08/2009 w/ CDY. Pls advise. Allergies  Allergen Reactions  . Ibuprofen     REACTION: causes nausea and vomitting

## 2010-09-21 NOTE — Telephone Encounter (Signed)
Per CY-ok to refill now with 3 refills.

## 2010-09-21 NOTE — Telephone Encounter (Signed)
Pt's spouse aware refill has been called to the [facility].

## 2010-09-22 ENCOUNTER — Telehealth: Payer: Self-pay | Admitting: Internal Medicine

## 2010-09-22 NOTE — Telephone Encounter (Signed)
Per phone note yesterday this called called to pharmacy. Spoke with pharmacist and she states does not have this on file, so I gave verbal okay to refill this with 3 additional refills. LMOM for pt's spouse to be made aware this was done.

## 2010-09-27 NOTE — Assessment & Plan Note (Signed)
[facility]                            CARDIOLOGY OFFICE NOTE   NAME:[name], [name]                    MRN:          [id number]  DATE:11/21/2007                            DOB:          1921/04/24    PRIMARY CARE PHYSICIAN:  Gabriel Earing, M.D.   REASON FOR PRESENTATION:  Evaluate the patient with coronary artery  disease.   HISTORY OF PRESENT ILLNESS:  The patient returns for a 53-month followup.  He is 77 years old now.  Since I last saw him, he has done quite well.  He has not felt any palpitations.  He has had no presyncope or syncope.  He gets around with a cane.  He has had no chest discomfort, neck, or  arm discomfort.  He has some baseline shortness of breath, but this is  not any worse.  He denies any PND or orthopnea.   PAST MEDICAL HISTORY:  1. Coronary artery disease (see the March 28, 2007 note for      details).  2. Well-preserved ejection fraction.  3. Atrial fibrillation.  4. Right soleus muscle bleed, on Coumadin (intolerant to COUMADIN      secondary to this).  5. Sick sinus syndrome.  6. Status post Medtronic pacemaker placement in 2007.  7. Chronic obstructive pulmonary disease.  8. Asthmatic bronchitis, requiring steroids.  9. Remote deep venous thrombosis.  10.Previous pulmonary embolism.  11.Diabetes mellitus.  12.Dyslipidemia.  13.Hypertension.  14.Eye surgery.  15.Nose surgery.   ALLERGIES:  Intolerant to NONSTEROIDALS.   MEDICATIONS:  1. Prednisone 5 mg daily.  2. Clonazepam.  3. Albuterol.  4. Pulmicort.  5. Aspirin 325 mg daily.  6. Metoprolol 25 mg b.i.d.  7. Amiodarone 200 mg daily.  8. Lasix 40 mg daily.  9. Glipizide 7.5 mg daily.  10.Lovastatin 40 mg daily.  11.Klor-Con.  12.Lantus.  13.Multivitamin.   REVIEW OF SYSTEMS:  As stated in the HPI and otherwise negative for all  other systems.   PHYSICAL EXAMINATION:  GENERAL:  The patient is in no distress.  VITAL SIGNS:  Blood pressure  160/80 (repeated 140/70), heart rate 86,  and atrial paced.  HEENT:  Eyelids are unremarkable.  Pupils equal, round, and reactive to  light.  Fundi not visualized.  Oral mucosa unremarkable.  NECK:  No jugular venous distention at 45 degrees.  Carotid upstrokes  brisk and symmetrical.  No bruits, no thyromegaly.  LYMPHATICS:  No adenopathy.  LUNGS:  Decreased breath sounds with few expiratory wheezes, but no  crackles.  BACK:  No costovertebral angle tenderness.  CHEST:  Well-healed pacemaker pocket.  HEART:  PMI not displaced or sustained.  S1 and S2 within normal limits.  No S3, no S4, no clicks, no rubs, and no murmurs; distant heart sounds.  ABDOMEN:  Mildly obese.  Positive bowel sounds.  Normal frequency and  pitch.  No bruits, no rebound, no guarding, and no midline pulsatile  mass.  No hepatomegaly, no splenomegaly.  SKIN:  No rashes, no nodules.  EXTREMITIES:  Pulses are 2+ throughout.  No cyanosis, no clubbing, and  no edema.  NEURO:  Grossly intact.   EKG, atrial paced rhythm with ventricular tracking, left axis deviation,  left anterior fascicular block, and no acute ST-T wave changes.   ASSESSMENT AND PLAN:  1. Coronary artery disease.  The patient has had no symptoms related      to this.  No further cardiovascular testing is suggested.  We will      continue with risk reduction.  2. Atrial fibrillation.  He is maintaining sinus rhythm with the      amiodarone apparently.  I did not see any evidence of mode-      switching, reviewing Dr. Odessa Fleming notes.  He is going to get a TSH      and liver profile today.  We talked about getting his eyes examined      once a year, and his wife understands this.  He has pulmonary      function testing in the chart.  He sees Dr. Maple Hudson.  I am going to      defer further pulmonary function testing or chest x-ray to Dr.      Maple Hudson, to follow up his chronic lung disease and his amiodarone.  3. Dyslipidemia per Dr. Andi Devon.  The goal  should be an LDL less than      70 and HDL greater than 40.  4. Diabetes per Dr. Andi Devon.  5. Hypertension.  Blood pressure is controlled and he will continue      the medications as listed.  6. Followup.  I will see him back in 1 year.  Dr. Graciela Husbands will see him      in about 6 months.  We will continue to alternate these visits.     Rollene Rotunda, MD, [facility]  Electronically Signed    JH/MedQ  DD: 11/21/2007  DT: 11/22/2007  Job #: [id number]   cc:   Gabriel Earing, M.D.

## 2010-09-27 NOTE — Assessment & Plan Note (Signed)
[facility]                         ELECTROPHYSIOLOGY OFFICE NOTE   NAME:ROBBINSNieko, Clarin                    MRN:          [id number]  DATE:05/01/2007                            DOB:          09-16-1920    [name] is seen for pacemaker implantation for tachy/brady syndrome  and he is doing quite well.  His device has been in about a year.  He  has had no recurrent syncope.   The [employer] monitor is not agreeing with him.   There is some confusion in the chart as to what to do about his  amiodarone.  It looks like, from a note that he had with him, that I had  stopped his amiodarone, probably last April.  This is based on a stop  sign that I wrote on his medication list.  However, there is nothing in  my dictated note about this.  I am not quite sure.  Dr. Antoine Poche resumed  his amiodarone in November.  This is for atrial fibrillation that is  paroxysmal for which he is unable to take Coumadin because of a  retroperitoneal bleed.   His other medications include:   1. Prednisone.  2. Digoxin 0.125 that was not down-titrated following the reinitiation      of his amiodarone.  3. Metoprolol 50 q. 6.  4. Lasix.   EXAMINATION:  On examination today, his blood pressure was 133/66, his  pulse was 66.  His lungs were clear.  His heart sounds were regular.  The extremities were without edema.   Interrogation of his Medtronic Adaptor pulse generator demonstrates a P-  wave of 2.8 with impedance of 510, a threshold of 1.25 at 0.4 in the A,  11.2/497/0.5 at 0.4 in the V.  He is atrially paced 39% of the time.   IMPRESSION:  1. Paroxysmal atrial fibrillation, accounting for less than 0.1% of      the time.  2. Bradycardia, status post pacemaker implantation.  3. Amiodarone reinitiated for the above.  4. History of retroperitoneal bleeding, precluding Coumadin.  5. Normal left ventricular function.   Dr. Antoine Poche has reinitiated his amiodarone.   I was going to check his  Digoxin level today, but I think I may go ahead and stop his Digoxin,  given his normal left ventricular function.   He will follow up with Dr. Antoine Poche as previously scheduled.  We will  get a TSH and LFTs for amiodarone surveillance.  This will need to be  checked quarterly.     Duke Salvia, MD, [facility]  Electronically Signed    SCK/MedQ  DD: 05/01/2007  DT: 05/01/2007  Job #: [id number]   cc:   Rollene Rotunda, MD, [facility]

## 2010-09-27 NOTE — Assessment & Plan Note (Signed)
[facility]                             PULMONARY OFFICE NOTE   NAME:[name], Ostergaard                    MRN:          [id number]  DATE:12/26/2006                            DOB:          [birth year]    PULMONARY FOLLOW-UP:   PROBLEMS:  1. Asthma.  2. Allergic rhinitis.  3. Asthma triad, aspirin sensitivity, nasal polyps.  4. Deep vein thrombosis, pulmonary embolism.  5. Coronary disease, stent.  6. Diabetes.  7. Hypertension.  8. Insect sting hypersensitivity.  9. Atrial fibrillation, pacemaker (Dr. Klein/Dr. Antoine Poche).   HISTORY:  Fine summer, no hospitalizations.  Back on maintenance  prednisone 5 mg daily.  Little to no phlegm.  No changes to request.  Singulair has seemed to help.  He has had pneumococcal vaccine twice.   MEDICATIONS:  1. Prednisone 5 mg.  2. Digoxin 0.125 mg.  3. Clonazepam 0.5 mg.  4. Home nebulizer, Xopenex 1.25 mg and Pulmicort 0.25 mg b.i.d.  5. Singulair 10 mg.  6. Aspirin 325 mg .  7. Metoprolol 50 mg.  8. Lasix 40 mg.  9. Use of nitroglycerin, clonazepam p.r.n.   Drug-intolerant to Advil.   OBJECTIVE:  Weight 164 pounds, BP 120/58, pulse 50, room air saturation  98%.  CHEST:  Clear and quiet.  Breathing is unlabored.  Heart sounds are regular.  I do not hear murmur or gallop.  There is no neck vein distention or stridor, no adenopathy.  No ankle edema.   IMPRESSION:  Stable chronic obstructive pulmonary disease and allergic  rhinitis.   PLAN:  No changes to offer.  Schedule return in 6 months, earlier p.r.n.     Clinton D. Maple Hudson, MD, [facility], FACP  Electronically Signed    CDY/MedQ  DD: 12/26/2006  DT: 12/28/2006  Job #: [id number]   cc:   Gabriel Earing, M.D.

## 2010-09-27 NOTE — Assessment & Plan Note (Signed)
[facility]                            CARDIOLOGY OFFICE NOTE   NAME:[name], [name]                    MRN:          [id number]  DATE:09/24/2006                            DOB:          [birth year]    PRIMARY CARE PHYSICIAN:  Dr. Gabriel Earing.   REASON FOR PRESENTATION:  A valued patient with coronary disease.   HISTORY OF PRESENT ILLNESS:  The patient presents for followup.  He has  coronary disease as described below.  He says that since his  catheterization in March he has done relatively well.  He has had none  of the chest discomfort that prompted that hospitalization.  He has had  no neck or arm discomfort.  He has no palpitations, presyncope or  syncope.  He is limited in his activities, but gets around with a  walker.  Denies any new shortness of breath, there is no paroxysmal  nocturnal dyspnea or orthopnea.   PAST MEDICAL HISTORY:  Coronary artery disease (stent to the circumflex  in 1999.  He had a Cypher stent placed in his mid circumflex in 2004.  Last catheterization in March of 2008 demonstrated the left main was  normal, the LAD had a long 30% stenosis, there was long 30%-40% stenosis  after the first septal perforator, the first diagonal was normal, the  second diagonal had 25% stenosis, the circumflex had a proximal stent  with luminal regularities, second obtuse margin was excluded before a  stent area, the right coronary artery was dominant with 25% stenosis and  mid 60% stenosis), preserved ejection fraction by echocardiogram  September of 2007 (60%), history of atrial fibrillation, right soleus  muscle bleed although on Coumadin (the patient currently is listed as  Coumadin intolerant because of this), history of sick sinus syndrome,  tachybrady syndrome status post Medtronic pacemaker 2007, chronic  obstructive pulmonary disease, asthmatic bronchitis requiring chronic  steroids, remote history of a deep vein thrombosis,  previous pulmonary  embolism, diabetes mellitus, dyslipidemia, hypertension.   PAST SURGICAL HISTORY:  Eye surgery, nose surgery.   ALLERGIES:  Intolerance to nonsteroidals.   MEDICATIONS:  1. Metoprolol 50 mg daily.  2. Digoxin 0.125 mg 1/2 tablet daily.  3. Lasix 40 mg daily.  4. Klor-Con.  5. Lovastatin 20 mg daily.  6. Aspirin 325 mg daily.  7. Multivitamin.  8. Singulair 10 mg daily.  9. Prednisone 5 mg daily.  10.Metformin 500 mg b.i.d.  11.Glipizide 7.5 mg daily.  12.Clonazepam 0.5 mg daily.  13.Zolpidem 5 mg nightly.   REVIEW OF SYSTEMS:  As stated in the history of present illness,  positive for ringing in his ears, cough with chronic sputum production.  Negative for other systems.   PHYSICAL EXAMINATION:  The patient is in no distress.  Blood pressure 134/62, heart rate 87 and regular.  HEENT:  Eyes unremarkable, pupils equal, round and reactive to light,  fundi not visualized.  NECK:  No jugular venous distension, __________ , carotid upstroke brisk  and symmetric, no bruits, no thyromegaly.  LYMPHATICS:  No adenopathy.  LUNGS:  Decreased breath sounds without wheezing or crackles.  CHEST:  Well-healed pacemaker pocket.  HEART:  PMI not displaced or sustained, S1 and S2 within normal limits,  no S3, no S4, no clicks, no rubs, no murmurs.  ABDOMEN:  Flat, positive bowel sounds normal in frequency and pitch, no  bruits, no rebound, no guarding, __________ ,  no organomegaly.  SKIN:  No rashes, no nodules.  EXTREMITIES:  Have 2+ pulses, no edema.   EKG atrial pace rhythm, left axis deviation, left anterior vesicular  block, early transition V1 and V2, no acute ST-wave changes.   ASSESSMENT/PLAN:  1. Coronary disease.  The patient is having no ongoing symptoms.  No      further cardiovascular testing is suggested.  He will continue with      medical management.  2. Hypertension.  His blood pressure is well controlled.  3. Diabetes per Dr. Andi Devon.  4.  Dyslipidemia.  He had a HDL in the 50s and LDL of about 100.  I      will continue him on his current regimen.  5. Followup.  I will see the patient back in 6 months or sooner if      needed.     Rollene Rotunda, MD, [facility]     JH/MedQ  DD: 09/24/2006  DT: 09/24/2006  Job #: [id number]   cc:   Gabriel Earing, M.D.

## 2010-09-27 NOTE — Assessment & Plan Note (Signed)
[facility]                         ELECTROPHYSIOLOGY OFFICE NOTE   NAME:ROBBINSDagmawi, Venable                    MRN:          [id number]  DATE:05/01/2008                            DOB:          1920/11/19    HISTORY OF PRESENT ILLNESS:  Mr. Inge is seen in followup for a  pacemaker implanted for bradycardia in the setting of paroxysmal atrial  fibrillation.   His major complaint is that he does not feel very good, and he is quite  tired.  He does not snore.   MEDICATIONS:  Notable for the intercurrent addition of metformin, but he  does take legion medications include amiodarone.  A surveillance  laboratories, of which were checked by Dr. Antoine Poche in July. He  continues a low-dose prednisone for his COPD.  He also is at 5 mg a day.  He also on clonazepam 0.5, Pulmicort 0.25 b.i.d., aspirin 325,  metoprolol 25 b.i.d., glipizide 7.5, lovastatin 40, potassium 20,  metformin 500 b.i.d., and Lasix 40.   PHYSICAL EXAMINATION:  VITAL SIGNS:  His blood pressure is 147/74, which  is down about 20 points since when he was last seen.  His pulse was 78.  His weight was 173, which is up and about 7 pounds over the last year.  LUNGS:  Diffuse wheezing.  His breath sounds were quite distant.  HEART:  Sounds were regular.  NECK:  Veins were not appreciated.  EXTREMITIES:  Without edema.   Interrogation of Medtronic Adapta pulse generator demonstrates a P-wave  of 2.8 with impedance 510, a threshold 1.375 at 0.4, the R-waves are  11.2 with impedance of 543 with threshold 0.75 at 0.4.  Battery voltage  is 2.78.   IMPRESSION:  1. Sinus node dysfunction and paroxysmal atrial fibrillation.  2. Status post pacer for the above.  3. Amiodarone initiated for the atrial fibrillation.  4. Normal left ventricular function.  5. Chronic obstructive pulmonary disease.  6. Fatigue.   Mr. Oommen is not doing very well overall with general lassitude.  I  have suggested  a followup with Dr. Jetty Duhamel about this.   His amiodarone seems to be holding his atrial fibrillation very well.  Given that, we will plan to decrease his amiodarone to 100 mg a day.   He will see Dr. Dorethea Clan again next.  We will see him again one year's  time.     Duke Salvia, MD, [facility]  Electronically Signed    SCK/MedQ  DD: 05/01/2008  DT: 05/02/2008  Job #: [id number]

## 2010-09-27 NOTE — Assessment & Plan Note (Signed)
[facility]                            CARDIOLOGY OFFICE NOTE   NAME:[name], [name]                    MRN:          [id number]  DATE:03/28/2007                            DOB:          April 12, 1921    PRIMARY CARE PHYSICIAN:  Gabriel Earing, M.D.   REASON FOR PRESENTATION:  Evaluate patient with coronary disease.   HISTORY OF PRESENT ILLNESS:  The patient returns for 6 months followup.  He is an 77 year old gentleman with coronary disease as described below.  He has done quite well since I last saw him.  He follows closely with  Dr. Fannie Knee.  He has had no chest discomfort, neck or arm  discomfort.  He has had no palpitations, presyncope, or syncope.  He has  his baseline breathing difficulties but gets along well enough to do his  activities of daily living, including a little stuff in his yard.   PAST MEDICAL HISTORY:  1. Coronary artery disease (Stent to the circumflex in 1999.  He had a      CYPHER stent placed in his mid circumflex in 2004.  The last      catheterization was March 2008, which demonstrated the left main to      be normal, the LAD had a long 30% stenosis, there was a long 30-40%      stenosis after the 1st septal perforator, the first diagonal was      normal, there was a second diagonal that had 25% stenosis, the      circumflex had a proximal stent with luminal irregularities, second      obtuse marginal was occluded before the stented area, the right      coronary artery was dominant with 25% stenosis and mid 60%      stenosis.)  Preserved ejection fraction (70% in 2007 echo).  2. History of atrial fibrillation.  3. Right soleus muscle bleed on Coumadin.  (The patient is intolerant      to Coumadin because of this).  4. History of sick sinus syndrome.  5. Tachybrady syndrome, status post Medtronic pacemaker, 2007.  6. Chronic obstructive pulmonary disease.  7. Asthma bronchitis, requiring steroids.  8. Remote history  of deep vein thrombosis.  9. Previous pulmonary embolism.  10.Diabetes mellitus.  11.Dyslipidemia.  12.Hypertension.  13.Eye surgery.  14.Nose surgery.   ALLERGIES:  Intolerant to nonsteroidals.   MEDICATIONS:  1. Prednisone.  2. Digoxin 0.125 mg daily.  3. Clonazepam.  4. Xopenex.  5. Pulmicort.  6. Singulair.  7. Aspirin 325 mg daily.  8. Multivitamin.  9. Metoprolol 50 mg daily.  10.Lasix 40 mg daily.  11.Klor-Con.  12.Lovastatin 20 mg daily.  13.Metformin 500 mg b.i.d.  14.Advair.  15.Atrovent.  16.Glipizide 7.5 mg daily.  (Of note, it appears that his amiodarone has been stopped).   REVIEW OF SYSTEMS:  As stated in the HPI and otherwise negative for  other systems.   PHYSICAL EXAMINATION:  GENERAL:  The patient is in no distress.  VITAL SIGNS:  Blood pressure 135/70, heart rate 91 and regular, weight  164 pounds, body mass index  25.  HEENT:  Eyelids unremarkable.  Pupils are equal, round, and reactive to  light.  Fundi not visualized.  Oral mucosa unremarkable.  NECK:  No jugular venous distention.  Wave form within normal limits.  Carotid upstroke brisk and symmetric.  No bruits.  No thyromegaly.  LYMPHATICS:  No cervical, axillary, or inguinal adenopathy.  LUNGS:  Decreased breath sounds without wheezing or crackles.  BACK:  No costovertebral angle tenderness.  CHEST:  Well healed pacemaker pocket.  HEART:  PMI not displaced or sustained.  S1 and S2 within normal limits.  No S3, no S4, no clicks, no rubs, no murmurs.  ABDOMEN:  Flat.  Positive bowel sounds, normal in frequency and pitch.  No bruits.  No rebound.  No guarding.  No midline pulsatile mass.  No  organomegaly.  SKIN:  No rashes.  No nodules.  EXTREMITIES:  Two plus pulses.  No edema.   EKG:  AV paced rhythm, 100% capture.   ASSESSMENT/PLAN:  1. Coronary disease.  The patient is having no symptoms.  He will      continue his secondary risk reduction.  2. Atrial fibrillation.  The patient is in  an AV paced rhythm      currently.  He cannot take Coumadin with his spontaneous bleed in      the past.  He has been managed with amiodarone.  He was      hospitalized frequently in the past year.  There was some confusion      and he is currently not taking the amiodarone.  I do not believe      this needed to be stopped and I am going to give him a prescription      for 200 mg daily.  I am going to review all of his discharge      summaries and if I cannot figure it out, discuss it with Dr. Graciela Husbands      to make sure that this was an error and that he is to be on the      amiodarone.  I do not know of any contraindication and it would      seem that maintaining a sinus rhythm would be important.  3. Hypertension.  Blood pressure is well controlled and he will      continue with medications as listed.  4. Dyslipidemia.  He had a reasonable lipid profile earlier this year.      We will follow this as indicated.   FOLLOWUP:  I will see him back in July.  I will see him probably once a  year after that.  Dr. Graciela Husbands will see him in December.  We can then see  him as needed in between as well.     Rollene Rotunda, MD, [facility]  Electronically Signed    JH/MedQ  DD: 03/28/2007  DT: 03/28/2007  Job #: [id number]

## 2010-09-27 NOTE — Discharge Summary (Signed)
[name], LINDAHL NO.:  [id number]   MEDICAL RECORD NO.:  [id number]          PATIENT TYPE:  INP   LOCATION:  4708                         FACILITY:  [facility]   PHYSICIAN:  Chauncey Reading, D.O.  DATE OF BIRTH:  1920/07/14   DATE OF ADMISSION:  07/24/2008  DATE OF DISCHARGE:  07/25/2008                               DISCHARGE SUMMARY   DISCHARGE DIAGNOSES:  1. Dizziness likely secondary to viral labyrinthitis.  2. Chronic atrial fibrillation.  3. Hypertension.  4. Diabetes.  5. Coronary artery disease.  6. History of pulmonary embolism with deep venous thrombosis.  7. Asthma.  8. Renal insufficiency.   DISCHARGE MEDICATIONS:  With accurate doses:  1. Prednisone 5 mg one tablet daily.  2. Clonazepam 0.5 mg one tablet at bedtime as needed.  3. Albuterol 0.083% nebulizer solution 4 times a day via nebulizer as      needed.  4. Pulmicort 0.25 mg/2 mL suspension use 2 times a day.  5. Aspirin 325 mg tablet one tablet a day.  6. Metoprolol 50 mg once a day.  7. Amiodarone 200 mg 2 tablets every morning total dose 400 mg once a      day.  8. Lasix 40 mg once a day.  9. Nitroglycerin 0.3 mg sublingually as needed for chest pain.  If      taken more than 2 tablets, please contact 911 or your primary care      doctor.  10.Glipizide 500 mg one-half tablet once daily.  11.Metformin 500 mg twice daily.  12.Lovastatin 40 mg once daily.  13.Klor-Con M 20 mEq once daily.  14.Meclizine over-the-counter vertigo medication 2 tablets every 6      hours as needed for vertigo.  Advised to take for max 2 days every      6 hours and then take as needed.   DISPOSITION AND FOLLOWUP:  The patient was asked to make an appointment  with his primary care Tequilla Cousineau within 2 weeks of discharge since we were  unable to make one for him since he was discharged over the weekend.   PROCEDURES PERFORMED:  CT scan:  CT head without contrast, no acute CVA,  sinusitis, postoperative  changes.   HISTORY OF PRESENT ILLNESS:  [name] is an 77 year old white male  with past medical history of DVT, PE, diabetes mellitus type 2, coronary  artery disease, status post pacemaker and drug-eluting stent, AFib,  hypertension, asthma, and allergic rhinitis who presents with a  complaint of dizziness.  He first experienced dizziness 2 days prior to  admission.  It was precipitated by sudden head movement from left to the  right.  It lasted for about 2 minutes and subsided on its own. He has  not experienced any diarrhea, increased frequency of urination,  vomiting, change in medication dose or type, palpitation, or chest pain  prior to this episode.  He then experienced regular episodes at least  once a day leading up to this visit to emergency department.  His last  episode was on the day of admission.   He has not lost consciousness, suffered  trauma, or seizure with dizzy  spell.  He does not feel room spinning around, either he feels blacked  out.  He is hard of hearing on the right side for many years.  He does  not report any tinnitus or vomiting associated with this.  He reports  headache, which is generalized.  He does not report  any focal  neurologic or motor or sensory deficits.  He has not traveled recently  or had sick contacts.  He has taken over-the-counter dizziness  medication without significant benefit.  He took 2 tablets every 4 hours  for the last 2 days.  The name of medication is unknown.   PHYSICAL EXAMINATION:  VITAL SIGNS:  On admission, temperature 97.1,  pulse rate of 78, respiratory rate of 18, blood pressure 143/78, O2  saturation of 98% on 2 L oxygen.  GENERAL:  The patient is alert and overweight appearing with prominent  belly and the extremities.  HEENT:  The patient is atraumatic and normocephalic.  The patient has  skin lesions extensively on the scalp and the extremities which appear  secondary to dry skin.  Pupils are equal, round, and  reactive to light.  The patient has no icterus.  No nystagmus.  No precipitation of symptoms  with sudden movement of the head.  Ears do not show any external  deformities.  The patient has decreased hearing on the left.  The  patient does not have external erythema on the nose or does not have  nasal discharge.  The patient does have intranasally edematous mucosa.  NECK:  Supple.  There is full range of motion. No thyromegaly.  RESPIRATORY: Clear to auscultation bilaterally. End expiratory wheezes.  CVS: S1 S2 normal. No murmur  Abdomen: prodtruding, soft, non tender, superficial vein enlargement. No  free fluid. No hepato splenomegaly.  CNS: Alert and oriented X 3. No motor or sensory deficits. CN II to XII  intact. Negative rombergs sign.  Psych: appropriate   Labs:  WBC 11.5; Hemoglobin (HGB) 13.3; MCV  95.9;  Platelet Count (PLT) 176  Sodium (NA)  139 Potassium (K)  4.3 Chloride  105  Bicarb  28 Glucose  212 BUN 25  Creatinine  1.77  Bilirubin, Total  0.8  Alkaline  Phosphatase  58  SGOT (AST)27  SGPT (ALT)  28  Total  Protein  5.4  Albumin-Blood  3.1  Calcium 8.7   Hospital Course:  1. Dizziness likely secondary from viral labyrinthitis. Patient was      admitted and monitored for orthostatics which were negative. No new      changes on EKG were observed. Patient was monitored for arrythmia      with telemetry and did not have additional arrythmia except Atrial      fibrillation, rate controlled. CT head was negative for mass or      bleed. Patient had no episodes of seizure. Physical therapy was      consulted for [facility], however it could not be      accomplished before patient left. We felt clinically his symptoms      were likely secondary to viral labyrythitis. Patient was advised to      continue meclizine for next two days and then use it as needed.  2. Atrial Fibrillation: Patient was rate controlled. He was not on      warfarin as he had experienced a  spontaneuous psoas bleed in the      past. No change in medication  was required during hospitalisation.  3. Hypertension: The blood pressure was well controlled through out      this hospitalization with home medications. No alterations were      made.  4. Diabetes: Metformin was held due to renal insufficiency. Patient      was discharged with instruction to follow up with primary care      Zuriel Roskos.  5. Coronary artery disease: Patient had no chest pain. Due to renal      insufficiency, Lasix was held temporarily.  The patient's EKG was      reviewed and did not reveal any new abnormality.  The patient had      been discharged without making any changes in the medication list      related to coronary artery disease.  6. History of pulmonary embolism with deep vein thrombosis.  The      patient does not have any signs of active DVT or PE.  We did not      provide any anticoagulation except prophylactic Lovenox 40 mg.  He      was not on Warfarin prior to admission due to a history of a psoas      bleed in the past.  7. Asthma. The patient has wheezing on clinical exam, which improved      with albuterol and Pulmicort nebulization.  The patient was      continued on prednisone 5 mg daily dose.  No further exacerbation      was evident to make this hospitalization.  The patient would be      followed on an outpatient basis.  8. Renal insufficiency.  The patient's last creatinine available in      electronic medical record was 1.5, which was taken 2 years prior to      this admission.  On this admission, the patient's creatinine was      2.0, which was elevated.  The patient was hydrated with normal      saline.  The patient will require further renal workup if renal      failure persists.  The patient was discharged with instruction to      follow up with the primary care Leontyne Manville.   The patient's vital signs at the time of discharge, Pulse 80, blood  pressure 152/58, respiratory rate  of 18, O2 saturation 97% on RA.      Clerance Lav, MD PhD  Electronically Signed      Chauncey Reading, D.O.  Electronically Signed    RS/MEDQ  D:  07/27/2008  T:  07/28/2008  Job:  [id number]   cc:   [employer]

## 2010-09-30 ENCOUNTER — Ambulatory Visit (INDEPENDENT_AMBULATORY_CARE_PROVIDER_SITE_OTHER): Payer: Medicare Other | Admitting: *Deleted

## 2010-09-30 DIAGNOSIS — I82409 Acute embolism and thrombosis of unspecified deep veins of unspecified lower extremity: Secondary | ICD-10-CM

## 2010-09-30 DIAGNOSIS — I2699 Other pulmonary embolism without acute cor pulmonale: Secondary | ICD-10-CM

## 2010-09-30 DIAGNOSIS — I4891 Unspecified atrial fibrillation: Secondary | ICD-10-CM

## 2010-09-30 LAB — POCT INR: INR: 2.4

## 2010-09-30 NOTE — Discharge Summary (Signed)
NAMEJAHAZIEL, [name] NO.:  [id number]   MEDICAL RECORD NO.:  [id number]          PATIENT TYPE:  INP   LOCATION:  2023                         FACILITY:  [facility]   PHYSICIAN:  Cecil Cranker, MD, FACCDATE OF BIRTH:  [birth year]   DATE OF ADMISSION:  04/05/2006  DATE OF DISCHARGE:  04/13/2006                               DISCHARGE SUMMARY   This patient has no known drug allergies.   PRINCIPAL DIAGNOSES:  1. Admitted with acute onset of dyspnea, altered mental status.      a.     Profound bradycardia secondary to potassium of 7.3, glucose       of 566, creatinine 1.7.      b.     With metabolic derangement correction heart rate increased.      c.     Atrial fibrillation/atrial flutter with difficult rate       control requiring escalating atrioventricular  modulators       throughout this admission.  2. Discharging day #2 status post implantation of Medtronic ADAPTA      dual-chamber pacemaker, Dr. Sherryl Manges.  3. Medical adjustment post pacemaker with rate control the last 24      hours. Heart rate less than 100 beats per minute for the last 16      hours.   SECONDARY DIAGNOSES:  1. Diabetes.  2. Hypertension.  3. Dyslipidemia.  4. Left heart catheterization August 2004. This study showed the left      main was without significant stenosis.  The LAD had a 50% stenosis      after the diagonal.  Left circumflex had a proximal stent which was      patent and then a 30-40% midpoint stenosis.  The first obtuse      marginal had a 20% stenosis throughout.  There were stents placed      in the mid to distal left circumflex August 2004.  Right coronary      artery had a 30-40% midpoint stenosis, ejection fraction greater      than 55%.  Echocardiogram September 2070 ejection fraction 60%.  No      left ventricular wall motion abnormalities.  Mild mitral      regurgitation.  5. Right psoas muscle spontaneous hemorrhage on Coumadin.   HOSPITAL COURSE:  The  patient presented through the emergency room on  November 22 with dyspnea and altered mental as status, found to be in  severe bradycardia.  He had a potassium of 7.3, a glucose of 566 and  creatinine 1.7.  He was given 50 g of Kayexalate, 10 units of IV insulin  and 1 ampule of bicarbonate. Throughout this hospitalization has had  strict glucose control.  He had a mildly elevated D. dimer and a VQ scan  showed low probability of pulmonary embolism.  With correction of his  metabolic derangements he also had return of high heart rates. These  required titration of both Cardizem and metoprolol.  For a while he was  on a Cardizem drip.  Starting on the 24th his metoprolol was readded at  12.5  mg t.i.d.  The Cardizem was started as p.o. at 90 mg every 6 hours.  On the 25th the metoprolol was increased to 25 mg t.i.d.  On the 26th  metoprolol was increased to 50 mg t.i.d. in concert with Cardizem 360 mg  daily. On the 27th metoprolol went to 100 mg b.i.d., Cardizem 360.  On  the 27th he was seen by electro physiologist, Dr. Sherryl Manges. Dr.  Graciela Husbands recommended implantation of pacemaker with post pacemaker rate  control.  The patient would normally have discharged postprocedure day  #1 but was kept an extra 24 hours because his heart rate had been over  100 beats per minute in the postprocedure period.  He was started on  higher dose of Cardizem on November 29 at 480 mg of Cardizem a day.  He  continued on metoprolol 100 mg b.i.d.  Digoxin was loaded overnight and  he starts on digoxin 0.125 mg daily.  With this medication regimen the  patient's heart rate has been below 100 beats per minute.  He still  remains in atrial fibrillation but rates are well controlled. The  patient is currently not on Coumadin.   The patient discharged on the following medications:  1. For rate control of his atrial fibrillation, diltiazem 240 mg 1      tablet in the morning, 1 tablet in the evening.  This is a new  dose      for him  2. Digoxin 0.125 mg daily.  This is a new medication.  3. Metoprolol 100 mg twice daily, 1 tablet in the morning, 1 tablet in      the evening.  This is a new dose for him  4. Prednisone 5 mg daily.  5. Advair inhaler 500/50 1 puff twice daily.  6. Singulair 10 mg daily.  7. Lovastatin 20 mg daily.  8. Lentis insulin 18 units daily.  9. Enteric-coated aspirin 325 mg daily.  10.Xopenex/Atrovent nebulizers.  The Xopenex was also a new medication      for him and replaces albuterol.   PROCEDURE:  On November 28 implant of Medtronic ADAPTA single-chamber  pacemaker, Dr. Sherryl Manges.   Movement of left arm and incision care have been discussed with the  patient.   HISTORY:  [name] is an 77 year old male.  He has chronic atrial  arrhythmias, reportedly both atrial flutter and atrial fibrillation.  He  presents with extreme bradycardia and impaired mental status.  He was  recently hospitalized in October with a major soft tissue bleed related  to chronic anticoagulation. This has now been discontinued.  He has been  treated for both atrial fibrillation and a history of pulmonary embolism  with this Coumadin.  When he is in atrial fibrillation, he has rapid  rates and has difficulty with medication adjustment.   His wife reports that he has generally poor performance status.  He has  extreme fatigue with minor exertion.  Many days he feels weak  continuously.  He had not had much in the way of chest discomfort or  dyspnea until the afternoon of this presentation when he reported severe  shortness of breath.  He was also found to be poorly responsive and this  prompted EMS to be called.  On arrival they found the patient with a  heart rate of 20 and a poor response to stimulation.  They initiated  treatment with transcutaneous pacing with improved level of  consciousness.  The patient was given 1/2 ampule  calcium chloride and atropine in the emergency room but  this did not have significant effect.  However, after atropine, heart rate did increase into the 40s.  The  patient was alert and responsive but had somewhat slurred speech.   Laboratory studies showed potassium of 7.3.  a creatinine 1.7, glucose  566.  The patient had a recent admission to the hospital in [place] with an exacerbation of COPD.   The patient has coronary disease.  He has required percutaneous  intervention, stent placement in the  circumflex artery both 1999 and  2004.  He has chronic obstructive pulmonary disease which includes  asthmatic bronchitis and is generally maintained on steroids.  He has  diabetes requiring insulin therapy.  He has had hypertension and has  hyperlipidemia well-controlled.  There is a remote history of cataract  surgery.  He has mild chronic renal insufficiency.   Plan will be to admit the patient to provide Kayexalate.  The patient  probably had dietary indiscretion at Thanksgiving since this is the day  of his admission.  He will be hydrated and diabetic control instituted.   LABORATORY STUDIES:  Complete blood count on November 25:  Hemoglobin  12.7, hematocrit 37.8, white cells 10.7, platelets 185.  Serum  electrolytes on November 28: Sodium 135, potassium 4.7, chloride 104,  bicarbonate 27, BUN is 28, creatinine 1.3 and glucose 102.  TSH this  admission was 2.067.      Maple Mirza, PA      Cecil Cranker, MD, [facility]  Electronically Signed    GM/MEDQ  D:  04/13/2006  T:  04/14/2006  Job:  [id number]   cc:   Cecil Cranker, MD, [facility]  Clinton D. Maple Hudson, MD, FCCP, [facility]. Ladona Ridgel, MD  Duke Salvia, MD, [facility]

## 2010-09-30 NOTE — Assessment & Plan Note (Signed)
[facility]                         ELECTROPHYSIOLOGY OFFICE NOTE   NAME:ROBBINSOlney, Monier                    MRN:          [id number]  DATE:05/21/2006                            DOB:          02/11/1921    PACEMAKER NOTE.   Mr. Hufnagle is seen today in the clinic on the 7th of January, 2008, for  a wound check of his newly implanted Medtronic Model #[id number] Adapta.  Date of implant was April 26, 2006, for tachybrady syndrome and  atrial fibrillation.  On interrogation of his device today his battery  voltage is 2.78, T waves measured 2.8 to 4 millivolts with an atrial  capture threshold of 1.5 volts at 0.4 milliseconds and an atrial lead  impedance of 431.  R waves measured 8 to 11.20 millivolts with a  ventricular pacing threshold of 1 volt at 0.4 milliseconds and a  ventricular lead impedance of 522.  There were 28 mode switch episodes  noted totaling 2.4% of the time since implant.  No changes were made in  his parameters today.  His Steri-Strips were removed, his wound was  without redness or edema and he will be seen back again in April.      Altha Harm, LPN  Electronically Signed      Duke Salvia, MD, [facility]  Electronically Signed   PO/MedQ  DD: 05/21/2006  DT: 05/21/2006  Job #: [id number]

## 2010-09-30 NOTE — H&P (Signed)
[name], [name]   MEDICAL RECORD NO.:  [id number]          PATIENT TYPE:  INP   LOCATION:  1829                         FACILITY:  [facility]   PHYSICIAN:  [name], MD, FACCDATE OF BIRTH:  [birth year]   DATE OF ADMISSION:  04/05/2006  DATE OF DISCHARGE:                                HISTORY & PHYSICAL   REFERRING:  [facility].   PRIMARY CARDIOLOGIST:  Dr. Corinda Gubler.   PRIMARY PULMONOLOGIST:  Dr. Maple Hudson.   HISTORY OF PRESENT ILLNESS:  An 77 year old gentleman with chronic atrial  arrhythmias, reportedly both atrial flutter and atrial fibrillation,  presents with extreme bradycardia and impaired mental status.  [name]  was recently hospitalized last month with a major soft tissue bleed related  to chronic anticoagulation, which was discontinued.  He was being treated  therefore for both atrial fibrillation and a history of pulmonary embolism  in 2005.  He had a rapid rate in atrial fibrillation requiring adjustment of  his medications.  He was anemic at presentation.   His wife reports that he has generally poor performance status.  He reports  extreme fatigue with minor exertion.  He has many days where he feels weak  continuously.  He has not had much in the way of chest discomfort nor  dyspnea until this afternoon with he reported severe shortness of breath.  It was found to be poorly responsive prompting EMS to be called.  On  arrival, they found the patient with a heart rate of 20 and poor response to  stimulation.  The initiated treatment with a transcutaneous pacemaker  resulting in improved level of consciousness.  On arrival in the emergency  department, the device was found not to be pacing, even at maximal output.  This did cause the patient considerable pain and distress.  He was given one-  half amp of calcium chloride and atropine.  The former did not appear to  have a significant affect.  After atropine, the heart  rate increased to 40.  The patient was awake and responsive, but had somewhat slurred speech.  He  had been given benzodiazepine by EMS.   Laboratory subsequently returned, and a potassium of 7.3 was reported.  The  pH was 7.36.  Creatinine was 1.7.  Glucose was 566.  His wife reports a  recent admission to a hospital in [place] with a COPD exacerbation.  Diabetic control was poor there as well, attributed to steroids.  She said  that control has been good in recent days and that glucose was 114 this  morning.   The patient does have coronary disease, having required percutaneous  intervention and stent placement in the circumflex artery in both 1999 and  2004.  He has chronic obstructive pulmonary disease including asthmatic  bronchitis and is generally maintained on steroids at various doses.  He has  diabetes mellitus as noted above requiring treatment with insulin.  He has  had hypertension and hyperlipidemia that have been well controlled.  There  is a remote history of cataract surgery.  He has reportedly had mild chronic  renal insufficiency in the past, but the creatinine was 1.2 in September.   ALLERGIES:  THE PATIENT HAS NO KNOWN DRUG ALLERGIES.   MEDICATIONS AT DISCHARGE:  1. Included metoprolol 25 mg b.i.d.  2. Prednisone 10 mg q.d.  3. Diltiazem 360 mg q.d.  4. Lantus insulin 18 units nightly  5. Advair inhaler.  6. Singulair inhaler.  7. Lovastatin 20 mg q.d.  8. Clonazepam 0.5 mg nightly  9. Albuterol and Atrovent by nebulizer.  10.Nystatin.  11.P.r.n. nitroglycerin.   SOCIAL HISTORY:  No tobacco products; no excessive use of alcohol; married  and lives locally.   FAMILY HISTORY:  No prominent coronary disease.   REVIEW OF SYSTEMS:  Unobtainable.   PHYSICAL EXAMINATION:  A somewhat lethargic, but appropriate gentleman.  Heart rate is 40 and regular, respirations 22, blood pressure 105/50.  HEENT:  EOMs full; lids and conjunctivae unremarkable.  NECK:   Mild jugular venous distension; normal carotid upstrokes without  bruits.  ENDOCRINE:  No thyromegaly.  HEMATOPOIETIC:  No adenopathy.  SKIN:  Pallor; no significant lesions.  CHEST:  Grossly clear to auscultation.  Increased AP diameter.  CARDIAC:  Normal first and second heart sounds; no prominent murmurs; normal  PMI.  ABDOMEN:  Soft and nontender; decreased bowel sounds; no masses; no  organomegaly.  EXTREMITIES:  1-2+ pretibial edema; decreased distal pulses.  NEUROMUSCULAR:  No focal findings.   Initial laboratory is as noted above.  Chest x-ray is pending.  EKG shows  atrial fibrillation with slow ventricular response; incomplete right bundle  branch block; markedly decreased voltage in leads one, II and III; delayed R-  wave progression.   IMPRESSION:  [name] presents with hyperkalemia and bradycardia.  On  closer questioning of his wife, she reports that he uses quite a bit of salt  substitute.  This could be the exogenous source of his potassium.  His renal  function has deteriorated somewhat since September for unclear reasons.  We  will hydrate him, correct hyperkalemia and improve control of diabetes.  For  the time-being, he does not need a temporary pacemaker and probably does not  need a permanent pacemaker either.  Myocardial infarction will be ruled out.  TSH will be checked as well as a D-dimer.  He will be treated with low-dose  Lovenox for now.      [name], MD, [facility]  Electronically Signed     RMR/MEDQ  D:  04/05/2006  T:  04/05/2006  Job:  [id number]

## 2010-09-30 NOTE — Assessment & Plan Note (Signed)
[facility]                               PULMONARY OFFICE NOTE   NAME:[name], [name]                    MRN:          [id number]  DATE:02/15/2006                            DOB:          1920-12-21    PULMONARY/ALLERGY FOLLOWUP   PROBLEM:  1. Asthma.  2. Allergic rhinitis.  3. Asthma triad/aspirin sensitivity/nasal polyps.  4. Deep vein thrombosis/pulmonary embolism.  5. Coronary disease/stent.  6. Diabetes.  7. Hypertension.  8. INSECT STING hypersensitivity.  9. Atrial fibrillation.   HISTORY:  Mr. and Mrs. [name] are seen today, with his main chart not yet  over from cardiology.  He was hospitalized with right leg pain on September  18, and found to be in atrial fibrillation with a rapid ventricular  response.  His complaint was a right leg cramping, and he was found to have  an ecchymosis in the right thigh on Coumadin.  Dictated discharge summary is  pending.  He had been hospitalized earlier with asthma and cardiac  arrhythmia in [place].  He says he is now breathing fine with no  phlegm or cough, and he denies shortness of breath within his habitual  exercise limit.  He does complain of insomnia, saying it is not relieved by  clonazepam 0.5 mg.  The right thigh hematoma has resolved, Coumadin has been  discontinued, and Pulmicort has been discontinued.  He is satisfied with his  current pulmonary status.   MEDICATIONS:  1. Home nebulizer with albuterol 2.5 and ipratropium 0.5 mg up to q.i.d.      p.r.n.  2. Lopressor 50 mg b.i.d.  3. Prednisone 5 mg daily.  4. Cardizem CD 360 mg.  5. Lantus 100 units.  6. Advair 500/50 b.i.d.  7. Singulair 10 mg b.i.d.  8. Lovastatin 20 mg.  9. Aspirin 325 mg.  10.Nitroglycerin p.r.n.   OBJECTIVE:  Weight 178 pounds, BP 156/60, pulse regular 69, room air  saturation 98%.  Breathing is unlabored on room air at rest.  Lung fields sound quite clear.  Heart sounds are regular, I do  not hear murmur or gallop.  There is 1+ to 2+ bilateral ankle edema, but no neck vein distention or  stridor, and no cyanosis.   IMPRESSION:  Recent problem has been atrial fibrillation with rapid  ventricular response, and what sounds like a Coumadin-induced ecchymosis in  the right thigh.  His chronic asthma/chronic obstructive pulmonary disease  is currently under good control, and we are reluctant to make changes.  Options were discussed.   PLAN:  1. Flu vaccine is given.  2. Discontinue clonazepam and replace it with Ambien 5 mg one or two at      bedtime p.r.n., with cautious use discussed.  3. Pulmicort nebulizer solution is recognized as discontinued.  4. Schedule return in 6 weeks, earlier p.r.n.       Clinton D. Maple Hudson, MD, [facility], FACP      CDY/MedQ  DD:  02/15/2006  DT:  02/17/2006  Job #:  [id number]   cc:   Gabriel Earing, M.D.  Cecil Cranker, MD, [facility]

## 2010-09-30 NOTE — Discharge Summary (Signed)
NAME:  [name], [name] NO.:  [id number]   MEDICAL RECORD NO.:  [id number]                   PATIENT TYPE:  INP   LOCATION:  2041                                 FACILITY:  [facility]   PHYSICIAN:  Vida Roller, M.D.                DATE OF BIRTH:  1920/11/09   DATE OF ADMISSION:  12/25/2003  DATE OF DISCHARGE:  12/30/2003                                 DISCHARGE SUMMARY   PROCEDURES:  CT of the chest.   HOSPITAL COURSE:  1. [name] is an 77 year old male with a history of coronary artery     disease.  He had stent to his circumflex placed in 1999, and then another     one in 2004.  His EF has been normal.  He has asthma and COPD and is on     chronic steroids.  He was seen in the office by Dr. Corinda Gubler for onset of     lower extremity edema and increased dyspnea.  He had a D-dimmer done     which was elevated and a CT was performed as an outpatient which was     positive for pulmonary embolus.  He was admitted to [facility]     for treatment and anticoagulation.  [name] was loaded with Coumadin     and was on heparin during this process.  By December 30, 2003, his INR had     been therapeutic for 24 hours.  He is to follow up with the Coumadin     Clinic on Friday and then with Dr. Corinda Gubler.  His Coumadin dosage is 5 mg     every day.  2. [name] was in a sinus rhythm upon admission with PACs.  On December 27, 2003, his heart rate became rapid and irregular.  He was evaluated     and it was found to be atrial flutter with 2:1 conduction.  He had     Cardizem IV added to his medication regimen and then was changed to p.o.     His final Cardizem dosage was 180 mg of Cardizem CD every day.  He     tolerated this well.  He had been hypertensive in the office and so his     Cozaar was changed to Hyzaar.  However, he had never had the Hyzaar     prescription filled.  The Hyzaar was discontinued, and he was switched     back to the Cozaar.   Further blood pressure control can be obtained by     increasing his calcium channel blocker as needed.  He is currently     tolerating both the Cozaar and the Cardizem.  3. He initially had lower extremity edema in the office but this resolved.     His blood pressure was under good control, and he was holding sinus     rhythm.  He had converted spontaneously on December 28, 2003.  Dr. Lewayne Bunting evaluated [name] and felt that catheter ablation could be     performed for recurrent flutter, and he would be required to stay on     Coumadin, but he felt that he did not need any further procedures at this     time.  4. By December 30, 2003, [name] was ambulating without chest pain or     shortness of breath.  His INR was 2.7.  He was considered stable for     discharge without outpatient followup arranged.   DISCHARGE CONDITION:  Improved.   DISCHARGE DIAGNOSES:  1. Pulmonary embolus.  2. Status post cardiac catheterization with a stent to the circumflex in     1999 and an additional circumflex stent placed in August 2004.  3. History of preserved left ventricular function by catheterization.  4. Chronic asthma and chronic obstructive pulmonary disease on chronic     steroid therapy.  5. Hypertension.  6. Obesity.  7. Diabetes.  8. Hyperlipidemia.  9. History of allergy to nonsteroidals and insect venom.  10.      Family history of congestive heart failure and chronic obstructive     pulmonary disease.  11.      Status post cataract surgery and lens implants.   DISCHARGE INSTRUCTIONS:  His activity level is to be as tolerated.  He has a  history of episodic dizziness and is to get an event monitor and the office  will call him.   He has an appointment at the [facility]madin Clinic on Friday at 3 p.m. August 19  and hopefully can pick up the event monitor at that time.  He is to follow  up with Dr. Cecil Cranker on September 22 at 11 a.m. to follow up with  Dr. Joni Fears D.  Young as scheduled.   DISCHARGE MEDICATIONS:  1. Prednisone 5 mg b.i.d.  2. Cozaar 50 mg every day.  3. Glipizide XL 5 mg every day.  4. Glucophage 500 mg every day.  5. Advair 250/50 b.i.d.  6. Lipitor 20 mg every day.  7. Calcium plus D b.i.d.  8. Multivitamin every day.  9. Ipratropium and albuterol as well as Pulmicort as prior to admission.  10.      Aspirin 81 mg every day.  11.      Cardizem CD 180 mg every day.  12.      Coumadin 5 mg every day or as directed.  He is not to take Hyzaar or aspirin 325.      Theodore Demark, P.A. [facility]                  Vida Roller, M.D.    RB/MEDQ  D:  12/30/2003  T:  12/31/2003  Job:  [id number]   cc:   Joni Fears D. Young, M.D.  [address] [place]  [place] [address]  Fax: [phone]   E. Graceann Congress, M.D.

## 2010-09-30 NOTE — Discharge Summary (Signed)
[place]. [facility]  Patient:    [name], [name]                    MRN: [id number] Adm. Date:  [id number] Disc. Date: 05/16/00 Attending:  Jetty Duhamel Driver                           Discharge Summary  DISCHARGE DIAGNOSES: 1. Chronic obstructive pulmonary disease secondary to chronic asthmatic    bronchitis, with acute exacerbation. 2. Adult onset diabetes type 2, non-insulin dependent, aggravated by steroids. 3. Coronary artery disease status post stent in 1999 with normal left    ventricular function. 4. Allergic rhinitis. 5. Insect venom hypersensitivity. 6. Benign prostatic hypertrophy. 7. Glaucoma.  BRIEF HISTORY:  A 77 year old white male who never smoked has been steroid dependent for chronic asthma and for unspecified arthritis, usually dyspneic if walking at more than slow pace on level ground.  He presented with a two-day history of increasing congestive cough, sputum becoming purulent.  He failed to respond adequately to steroid and bronchodilator therapy in the emergency room and remained dyspneic and was admitted.  PHYSICAL EXAMINATION:  Physical examination on admission was significant for bilateral expiratory wheezing, regular heart rhythm without murmur or gallop, abdominal obesity, absence of peripheral edema, cyanosis or clubbing.  Chest x-ray showed hyperinflation without infiltration.  Oxygen saturation was 95% on two liters nasal prongs.  EKG showed sinus tachycardia with occasional PVCs.  PAST MEDICAL HISTORY: 1. Ischemic heart disease, status post stent in June of 1999 with documented normal left ventricular function, no history of myocardial infarction. 2. Benign prostatic hypertrophy. 3. Obesity with diabetes. 4. Glaucoma. 5. History of sinus surgery for polyps, aspirin tolerant. 6. Diabetes followed at [facility]. 7. Arthritis for which he has seen W. Chase Picket, M.D., in the past.  HOSPITAL COURSE:  He was  managed with aerosol nebulizer, intravenous converted to oral steroids, and oral Tequin for antibiotic coverage for bronchitis. Supplemental oxygen was provided.  Blood sugar was covered with a loose sliding scale and maintenance of his oral hypoglycemic and remained adequately controlled, sensitive to steroid dose.  He gradually improved and was considered stable for discharge home to office follow-up.  Low dose oral theophylline was added for trial and will be continued for outpatient reassessment.  Additional historical medical problems of allergic rhinitis and insect venom hypersensitivity and desensitization were discussed briefly in the hospital but did not require hospital attention.  LABORATORY:  Sputum has been reincubated for better growth that showed moderate gram positive cocci, some in clusters; gram negative rods, abundant wbc, and will probably be read out as normal flora.  White blood cell count on admission was 15,600, hemoglobin 14.5, platelet count 395,000; 83% neutrophils, eosinophils 0.3.  Admission chemistry panel remarkable for glucose elevated at 158.  Subsequent CBGs ranged between 87 and 253.  Oxygen saturations were recorded only on oxygen at two liters per minute and held around 94%.  CHEST X-RAY:  COPD with no active disease, little change from January 29, 2000; mild hyperinflation, probable mild scarring left base, heart size upper limits of normal.  EKG showed sinus tachycardia with occasional PACs, otherwise normal.  DISCHARGE PLANS:  Home to office follow-up with office appointment within about two weeks.  Diet will be 1800 calories or as previously instructed for diabetic follow-up.  Activity will be as tolerated.  MEDICATIONS: 1. Aspirin 81 mg q.d. 2. Glucotrol XL 5 mg  q.d. 3. Nebulizer with albuterol up to four times daily p.r.n. 4. Albuterol inhaler two puffs q.i.d. p.r.n. 5. Advair Diskus one inhalation b.i.d. using home strength. 6.  Prednisone taper from 10 mg q.d. x 2 days, 10 mg t.i.d. x 2 days, 10 mg    b.i.d. x 2 days, then 10 mg q.d. 7. Tequin 400 mg q.d. x 5 more days. 8. Theophylline 100 mg b.i.d. with food. DD:  05/16/00 TD:  05/16/00 Job: [id number]

## 2010-09-30 NOTE — Assessment & Plan Note (Signed)
[facility]                             PULMONARY OFFICE NOTE   NAME:[name], [name]                    MRN:          [id number]  DATE:07/17/2006                            DOB:          02-07-1921    HISTORY OF PRESENT ILLNESS:  Patient is a male patient of Dr. Roxy Cedar  who has a known history of asthma, allergic rhinitis, and nasal polyps,  who presents to the office with a 2 day history of fever, chills,  productive cough with thick, yellow sputum.  Patient denies any  hemoptysis, orthopnea, PND, or leg swelling.   PAST MEDICAL HISTORY:  Reviewed.   CURRENT MEDICATIONS:  Reviewed.   PHYSICAL EXAMINATION:  Patient is a chronically ill-appearing male in no  acute distress.  Temperature is 100.3, blood pressure 108/62, O2 saturation is 97% on  room air.  HEENT:  Unremarkable.  NECK:  Supple without cervical adenopathy.  No JVD.  Lung sounds reveal coarse breath sounds without any wheezing or  crackles.  CARDIAC:  Reveals a paced rhythm.  ABDOMEN:  Soft, nontender.  EXTREMITIES:  Warm with trace edema.   IMPRESSION AND PLAN:  Acute upper respiratory infection. Patient is to  begin Omnicef x7 days.  Increase his prednisone up to 30 mg for 2 days,  then decrease it by 2 mg every 2 days until he is at his baseline of 5  mg daily.  May use Phenergan VC with codeine #8 oz 1-2 tsp. every 4-6  hours a day for cough.  Patient is aware of sedating effect.  Patient  does have a known history of diabetes mellitus, and is to watch his  sugars closely because of the steroid effect.  Patient is to contact us  or his primary care physician if his blood sugars increase over 200.  Patient will follow back up with Dr. Maple Hudson in 2-3 weeks or sooner if  needed.      Rubye Oaks, NP  Electronically Signed      Clinton D. Maple Hudson, MD, Tonny Bollman, FACP  Electronically Signed   TP/MedQ  DD: 07/17/2006  DT: 07/17/2006  Job #: [id number]

## 2010-09-30 NOTE — Consult Note (Signed)
NAME:  [name], [name] NO.:  [id number]   MEDICAL RECORD NO.:  [id number]                   PATIENT TYPE:  INP   LOCATION:  2041                                 FACILITY:  [facility]   PHYSICIAN:  Doylene Canning. Ladona Ridgel, M.D.               DATE OF BIRTH:  1920-11-14   DATE OF CONSULTATION:  12/29/2003  DATE OF DISCHARGE:                                   CONSULTATION   REASON FOR CONSULTATION:  Evaluation of atrial flutter.   HISTORY OF PRESENT ILLNESS:  The patient is a very pleasant 77 year old man  who is admitted to the hospital with acute pulmonary embolus.  This was  found after he presented with shortness of breath and lower extremity edema.  Initial d-dimer was elevated and a CT scan of the chest suggested a  pulmonary embolus.  He was admitted for evaluation.   In the hospital, he developed spontaneous sustained atrial flutter with a  rapid ventricular response.  He was placed on IV calcium channel blockers  and his atrial flutter resolved.  He is now referred for additional  evaluation.   The patient denies any history of syncope or near syncope.  Denies a history  of palpitations of any significant nature in the past.   PAST MEDICAL HISTORY:  1. Coronary artery disease with a stent placed in 1999 and again in 2004.     He has preserved LV function.  2. He has history of chronic asthma and COPD and has been on steroids in the     past.  3. He has a history of hypertension.  4. Diabetes.  5. Hyperlipidemia.   ALLERGIES:  NONSTEROIDAL ANTI-INFLAMMATORY DRUGS, HEPARIN, COUMADIN,  PREDNISONE, COZAAR, GLIPIZIDE, METFORMIN, ADVAIR, LIPITOR, and CARDIZEM.   SOCIAL HISTORY:  The patient is married and lives in [place].  He has  four children and denies tobacco and ethanol use.   FAMILY HISTORY:  Notable for a father dying at age 37 (murdered).  His  mother died of unknown causes at 63.  He had a brother who died of cancer  and two sisters who died  for congestive heart failure.   REVIEW OF SYMPTOMS:  Negative for vision problems.  He does have some  hearing loss and easy bruisability.  He denies cough, hemoptysis, or  syncope.  He denies nausea, vomiting, diarrhea, or constipation.  He does  have a chronic lower extremity swelling that he has complained of for the  last several weeks.  He denies polyuria, polydipsia, heat, or cold  intolerance, or recent weight change.   PHYSICAL EXAMINATION:  GENERAL:  He is a pleasant, elderly man in no  distress.  VITAL SIGNS:  Blood pressure was 140/82, respirations were 16, temperature  was 98.  HEENT:  Normocephalic and atraumatic.  Pupils are equal, round, and reactive  to light.  Oropharynx was moist.  His sclerae are anicteric.  NECK:  No  jugular venous distention.  There was no thyromegaly.  Trachea was  midline.  LUNGS:  Clear bilaterally to auscultation.  There were no wheezes, rales, or  rhonchi.  CARDIOVASCULAR:  Regular rate and rhythm with normal S1 and S2.  There were  no murmurs, rubs, or gallops.  ABDOMEN:  Soft, nontender, and nondistended.  There was no organomegaly.  EXTREMITIES:  There was 2+ peripheral edema bilaterally.  NEUROLOGICAL:  Nonfocal.  SKIN:  Warm and dry.   LABORATORY DATA:  EKG demonstrates normal sinus rhythm with normal axis  intervals.  Previously ECG demonstrates atrial flutter with 2:1 AV  conduction at a ventricular rate of 150 beats per minute.   IMPRESSION:  1. Paroxysm atrial flutter.  2. Pulmonary embolism.  3. Coumadin therapy secondary to #2.  4. Diabetes.  5. History of hypertension.   RECOMMENDATIONS:  As the patient is returned back to sinus rhythm on calcium  channel blockers, I would recommend continuation of his Coumadin and would  not recommend catheter ablation initially.  The patient will require long-  term Coumadin secondary to his pulmonary embolism.  If recurrent symptomatic  atrial flutter returned, then catheter ablation  would be certainly an  option.                                               Doylene Canning. Ladona Ridgel, M.D.    GWT/MEDQ  D:  12/29/2003  T:  12/30/2003  Job:  [id number]   cc:   Joni Fears D. Young, M.D.  [address] [place]  [place] [address]  Fax: [phone]

## 2010-09-30 NOTE — Discharge Summary (Signed)
NAMEPRAISE, DOLECKI             ACCOUNT NO.:  [id number]   MEDICAL RECORD NO.:  [id number]          PATIENT TYPE:  INP   LOCATION:  2023                         FACILITY:  [facility]   PHYSICIAN:  Cecil Cranker, MD, FACCDATE OF BIRTH:  02-07-1921   DATE OF ADMISSION:  04/05/2006  DATE OF DISCHARGE:  04/13/2006                               DISCHARGE SUMMARY   ADDENDUM:  He had been treated here throughout the hospitalization with  Lantus insulin 18 units daily as well as needing some sliding scale  adjustments throughout the approximate week that he has been here.  At  the time of discharge his serum blood glucose is under control and his  wife says that prior to this admission he had been treated with a  combination of Glucophage and a sulfonamide which was prescribed by his  prime care and that she did not need to use Lantus insulin in order to  keep his blood sugars between 90 and 110, so he will go home on the  medications that she was using prior to this as long as his blood sugar  remains controlled, otherwise, she is to check back with his physician  at the prime care and perhaps he will eventually need to utilize Lantus  insulin.  She says she has a prescription for it if it is needed.      Maple Mirza, PA      Cecil Cranker, MD, [facility]  Electronically Signed    GM/MEDQ  D:  04/13/2006  T:  04/14/2006  Job:  [id number]   cc:   [facility]  Dr. Maple Hudson

## 2010-09-30 NOTE — Discharge Summary (Signed)
[name], [name] NO.:  [id number]   MEDICAL RECORD NO.:  [id number]          PATIENT TYPE:  INP   LOCATION:  3741                         FACILITY:  [facility]   PHYSICIAN:  [name] Mirza, PA   DATE OF BIRTH:  1920-05-23   DATE OF ADMISSION:  04/23/2006  DATE OF DISCHARGE:  04/27/2006                               DISCHARGE SUMMARY   ALLERGIES:  He has no known drug allergies.   PRINCIPAL DIAGNOSIS:  1. Admitted with dyspnea and gross volume overload.  2. Volume overload secondary to congestive heart failure exacerbated      by pacer capturing above intrinsic rate which is 48 beats per      minute, pacer running at 60 beats per minute.  3. Volume overload exacerbated by severe chronic obstructive pulmonary      disease/asthmatic bronchitis.  4. Recurrent atrial fibrillation with very difficult rate control, not      Coumadin candidate secondary to a history of right psoas bleed      which was spontaneous on Coumadin.  5. Discharging day one status post pacemaker upgrade.  The patient had      his VVI generator explanted with a new implantation of Medtronic      Adapta dual-chamber pacemaker.  The patient also received a new      right atrial lead, Dr. Charlies [name].  The patient has had no post      procedural complications and is currently A pacing at discharge in      sinus rhythm.  6. The patient achieving excellent diuresis on IV Lasix this      admission.  The patient was not on diuretic therapy at home.  7. Hemoglobin A1c was 8.3 with poor diabetes control on oral      medications.  The patient required subcutaneous Lantus and meal      coverage during this hospitalization.   SECONDARY DIAGNOSES:  1. Coronary artery disease with preserved left ventricular function,      ejection fraction 60% at echocardiogram September 2007.  A:  Left circumflex stented in both the mid and distal portions August  2004.  Slight residual disease noted in the LAD after  the diagonal 50%,  OM 1 20% diffusely and the right coronary artery 30-40% midpoint on  catheterization August 2004.  1. Diabetes.  The patient really requires insulin coverage.  2. Hypertension.  3. Chronic obstructive pulmonary disease/asthmatic bronchitis.  4. Dyslipidemia.  5. History of deep vein thrombosis/pulmonary embolism.  6. Atrial fibrillation, not a Coumadin candidate secondary to      spontaneous right psoas bleed.  7. Recent admittance for symptomatic bradycardia, pacemaker implanted      April 11, 2006, single chamber for tachybrady.  8. April 26, 2006.  Implant of Medtronic Adapta dual-chamber      pacemaker with implant of a new right atrial lead, Dr. Charlies [name].  The patient will discharge on metoprolol and will start on      Amiodarone for atrial fibrillation control.   BRIEF HISTORY:  [name] is an 77 year old male.  He had a  recent hospitalization from November 22 to April 13, 2006 with  admittance for dyspnea, profound bradycardia in the setting of  electrolyte imbalance.  On admission his potassium was 7.3, his glucose  was 566 and creatinine 1.7.  He required implantation of Medtronic  Adapta single chamber pacemaker for tachybrady syndrome.  He has atrial  fibrillation which required increase medication therapy for rate control  prior to his discharge.  He went home with heart rates less than 100  beats per minute for over a 16-hour period and was breathing well.   The patient has coronary artery disease.  He is status post stenting in  the mid to distal left circumflex in 2004.   He has COPD/asthmatic bronchitis.  He is on chronic steroids and  nebulizer therapy.   The patient has diabetes and required insulin therapy last  hospitalization but went home on oral therapy.  Per his wife she says he  is well-controlled with oral medications.  His admission glucose is 230.   He has been having trouble breathing since about 2-3  days ago.  In the  last 48 hours he has been feeling much worse.  The last two nights he  has had to sit up to keep his breath. He has been worn out, fatigued,  cannot walk anywhere without losing his breath.  His legs are grossly  swollen.  He is not having chest pain, palpitation or presyncope.  His  BNP on admission is 1261.   His device has been interrogated.  It is V pacing and 60 beats a minute.  He has an intrinsic rate of 48 beats per minute.  His intrinsic rate has  been dampened by medication therapy.  He is on diltiazem 240 mg twice  a day and metoprolol 100 mg twice a day.  Rate controlling medications  will be modulated this admission and the pacemaker has been set in the  emergency room to 45 beats per minute which is slightly under his  intrinsic current rate of sinus bradycardia.  Troponin I studies will be  obtained.  He will be treated aggressively for congestive heart failure.  His serum blood glucose will be monitored on a four times a day basis.  Atrial fibrillation will be a possibility but his medications will be  decreased somewhat to allow for him to rise from his profound  bradycardia.  It is noted that he has a single chamber pacemaker.  This  will probably be adapted to a dual-chamber pacemaker.   HOSPITAL COURSE:  The patient presented December 10 with severe  respiratory distress, volume overload consistent with congestive heart  failure, an intrinsic rate sinus bradycardia of 48 beats per minute with  pacemaker V pacing at 60 beats per minute, not allowing atrial kick o  help cardiac function.  The patient's diltiazem and metoprolol have been  adjusted and soon after admission the patient's atrial fibrillation had  once again recurred, requiring readjustment of medications.  The patient  has been at times on IV Cardizem and at times on IV Lopressor.  His pulmonary medications and nebulizers have been continued this admission  along with IV Lasix diuresis  and continued nebulizer therapy.  The  patient's pulmonary status improved such that he was able to undergo  pacemaker adjustment on December 13.  The old pacemaker generator which  was a single chamber pacemaker was removed and a new Medtronic Adapta  device with dual ports was implanted along with  a new right atrial lead.  During this hospitalization the patient had lost about 20 pounds of  fluid and is breathing better.  At the time of discharge his wheezing is  much more subdued than it was on admission.  He is achieving 98% oxygen  saturation on room air.  On his current medical therapy he is achieving  heart rates in the 60s and he is A pacing.   Discharge medications include the following  1. Metoprolol ER 50 mg daily.  This is  new dose.  2. Digoxin 0.125 mg tablets 1/2 tablet daily.  This is a new dose.  3. Amiodarone 200 mg tablets.  He is to take 2 tablets in the morning      and 2 tablets in the evening from December 14 to December 28, then      2 tablets in the morning from December 29 to May 25, 2006.      Then he is to start 1 tablet daily beginning May 26, 2006.  4. Lasix 40 mg a day.  5. Potassium chloride 20 mEq a day.  6. Lovastatin 20 mg daily at bedtime.  7. Enteric-coated aspirin 325 mg daily.  8. Multivitamin daily with iron.  9. Singulair 10 mg daily.  10.Prednisone 5 mg daily.  11.Clonazepam 0.5 mg daily at bedtime.  12.Pulmicort Respules 0.25 mg in 2 mL both in the morning and the      evening.  13.Advair 500/50 1 puff twice daily.  14.Xopenex 0.63 mg nebulizer therapy and Atrovent 0.5 mg nebulizer      therapy every 4 hours not to be taken at the same time as the      Pulmicort Respules  15.Metformin 500 mg twice daily.  16.Glipizide 5 mg daily.  The patient is to stop Cardizem.  1. Nitroglycerin 0.4 mg under the tongue every 5 minutes as needed for      chest pain.  2. Keflex 250 mg four times daily 1 tablet 1/2 hour before breakfast,       lunch, dinner, bedtime for a period of 5 days after the device      implantation.   The patient is asked to weigh daily and to call [facility] if  his weight goes up at all.  Call cardiology/call cardiology, I repeat.  The patient is to monitor his fluid intake and do not take more than  three 12 ounce containers of fluid of any sort during a 24-hour.  A  weight log has been given to the patient with instructions for him to  weigh himself every day.  Mobility of the left arm has also been  discussed with the patient.  He is to keep his incision dry until  Thursday December 20.  He will have follow-up at [facility] for  several things.  One would be the pacer clinic.  At that time a basic  metabolic panel will be obtained.  He will also have pulmonary function  tests at that time.  It is mentioned once again that his hemoglobin A1c was 8.3 and his serum glucose was very hard to control during this  admission and required subcutaneous Lantus injections up to 20 units  daily as well as meal coverage with Humalog.  The patient is asked to  take this question up with Dr. Andi Devon, his primary caregiver.   LABORATORY STUDIES THIS ADMISSION:  On December 13 the hemoglobin was  12.8, hematocrit 38.6, white cells 11.6, platelets  284.  Serum  electrolytes on December 13:  Sodium 144, potassium 3.9, chloride 102,  carbonate 34, BUN is 31, creatinine 1.4, glucose 151.  Troponin I  studies, only one taken on admission was 0.020.  The BNP on admission  once again was 1261.      [name] Mirza, PA     GM/MEDQ  D:  04/27/2006  T:  04/27/2006  Job:  [id number]   cc:   Duke Salvia, MD, [facility]  Clinton D. [name] Hudson, MD, FCCP, FACP  Cecil Cranker, MD, [facility]  Gabriel Earing, M.D.

## 2010-09-30 NOTE — Assessment & Plan Note (Signed)
[facility]                         ELECTROPHYSIOLOGY OFFICE NOTE   NAME:[name], [name]                    MRN:          [id number]  DATE:08/21/2006                            DOB:          12/12/1920    SUBJECTIVE:  [name] is seen.  He is status post pacemaker  implantation for tachy/brady syndrome.  He has been treated with  amiodarone for this.  He had a retroperitoneal bleed, which precludes  the use of Coumadin.  He also has unfortunately a history of deep venous  thrombosis.   He recently had problems with chest pain and was admitted to the  hospital.  He was catheterized and was found to have a recently-occluded  circumflex, but it was felt that this was relatively late in the course,  and it was elected to treat him medically.  Further concerns were his  renal insufficiency and a potential for damage.   CURRENT MEDICATIONS:  1. Amiodarone 200 mg q.d.  2. Glipizide 7.5 mg.  3. Atrovent.  4. Metformin 500 mg b.i.d.  5. Lovastatin.   PHYSICAL EXAMINATION:  VITAL SIGNS:  Blood pressure 140/73, pulse 79.  LUNGS:  Clear.  HEART:  Sounds regular.  EXTREMITIES:  Without edema.   Interrogation of his Medtronic adaptable generator demonstrates a P-wave  of 5.6 with impedance of 525, threshold of 1.5 at 0.4 with an R-wave of  11.2, impedance of 505 and threshold of 0.75 at 0.4.  The device was re-  programmed.  He is not ventricularly pacing.   IMPRESSION:  1. Atrial fibrillation, paroxysmal.  2. Tachycardia/bradycardia syndrome.  3. Coumadin therapy precluded by a retro-peroneal bleed with a psoas      hematoma.  4. Deep venous thrombosis.  5. Ischemic heart disease with recent catheterization demonstrating      occlusion of his circumflex with near normal left ventricular      function.  6. Renal insufficiency.   PLAN:  [name] needed some clarification as to what happened in the  hospital.  I reviewed these data with him.   Will plan to check his  creatinine today.  As Dr. Cecil [name] is  retiring, I have referred [name] to Dr. Rollene [name], to  establish long-term care, as he is most familiar with his coronary  anatomy following his catheterization.   FOLLOWUP:  I will see him again in one year's time.     [name] Salvia, MD, [facility]  Electronically Signed    SCK/MedQ  DD: 08/21/2006  DT: 08/21/2006  Job #: [id number]

## 2010-09-30 NOTE — Assessment & Plan Note (Signed)
[facility]                             PULMONARY OFFICE NOTE   NAME:[name], [name]                    MRN:          [id number]  DATE:04/16/2006                            DOB:          Jun 01, 1920    PROBLEM LIST:  1. Asthma.  2. Allergic rhinitis.  3. Asthma triad, aspirin sensitivity, nasal polyps.  4. Deep vein thrombosis, pulmonary embolism.  5. Coronary disease, stent.  6. Diabetes.  7. Hypertension.  8. Insect sting hypersensitivity.  9. Atrial fibrillation, pacemaker.   HISTORY:  He was hospitalized November 22 by cardiology with altered  mental status related to bradycardia, hyperglycemia, and elevated  potassium.  He had an elevated D-dimer but V/Q scan showed low  probability for pulmonary embolism.  Medication adjustments did not  control heart rate and he had implantation of a pacemaker leaving him  with atrial fibrillation and controlled ventricular response.   DISCHARGE MEDICATIONS:  1. Diltiazem 240 mg b.i.d.  2. Digoxin 0.125 mg.  3. Metoprolol 100 mg b.i.d.  4. Prednisone 5 mg daily.  5. Advair 500/50.  6. Home nebulizer with Xopenex 1.25 mg t.i.d.  7. Pulmicort 0.25 mg b.i.d.  8. He was also discharged taking Advair 500/50 b.i.d.  9. Singulair 10 mg.  10.Lovastatin 20 mg.  11.Lantus insulin.  12.Enteric aspirin 325 mg.   He had previously been using DuoNeb via nebulizer but there is concern  this was over stimulating.   He has had flu vaccine this year and pneumococcal vaccine most recently  in 2003 with no booster dose.   PHYSICAL EXAMINATION:  VITAL SIGNS:  Weight 183 pounds, BP 122/68, pulse  regular 60, room air saturation 98%.  Pulse is somewhat irregular.  GENERAL:  He looks comfortable, quiet, and alert.  CHEST:  Very clear.  Breathing is unlabored.  There is 1 to 2+ edema at  both ankles.   IMPRESSION:  1. Chronic asthma/chronic obstructive pulmonary disease.  2. Coronary disease, stent.  3. Atrial  fibrillation, pacemaker.  4. Allergic rhinitis.  5. Diabetes.   PLAN:  1. Discontinue DuoNeb to be replaced with Xopenex 1.25 mg t.i.d. by      nebulizer and Pulmicort 0.25 mg b.i.d. by nebulizer.  2. He will discontinue Advair since this is duplication and he is      going to be using his nebulizer machine most regularly.  3. Pneumococcal vaccine booster was discussed and given.  4. He is scheduling return one year to keep flexibility but      understands to call or come in earlier p.r.n.  5. He will continuing following at [facility] with Dr. Andi Devon for his      primary care and with [facility].     Clinton D. Maple Hudson, MD, Tonny Bollman, FACP  Electronically Signed    CDY/MedQ  DD: 04/16/2006  DT: 04/17/2006  Job #: [id number]   cc:   Gabriel Earing, M.D.

## 2010-09-30 NOTE — H&P (Signed)
[name], [name] NO.:  [id number]   MEDICAL RECORD NO.:  [id number]          PATIENT TYPE:  INP   LOCATION:  3741                         FACILITY:  [facility]   PHYSICIAN:  Pricilla Riffle, MD, FACCDATE OF BIRTH:  1921-03-12   DATE OF ADMISSION:  04/23/2006  DATE OF DISCHARGE:                              HISTORY & PHYSICAL   ALLERGIES:  THE PATIENT HAS NO KNOWN DRUG ALLERGIES.   OTHER CAREGIVERS:  Dr. Sherryl Manges, electrophsyiologist.  Dr. Maple Hudson,  pulmonologist.  Dr. Graceann Congress, cardiologist.  He also goes to  Urgent Care.   PRESENTING CIRCUMSTANCE:  I can hardly breath and the oxygen that you  are giving me is not helping.   HISTORY OF PRESENT ILLNESS:  [name] is an 77 year old male with a  recent hospitalization, April 05, 2006 through April 13, 2006, for  dyspnea, profound bradycardia in a setting of electrolyte imbalance,  potassium was 7.3, glucose was 566, creatinine 1.7.  He eventually  required implantation of Medtronic Adapta single-chamber pacemaker.  He  has atrial fibrillation, which required increased medical therapy for  rate control prior to his discharge.  He went home with heart rates less  than 100 beats per minute for a 16 hour period and breathing well.   He has CAD, status post stenting of the mid and distal left circumflex  in 2004.  He has COPD/asthmatic bronchitis with continuing steroid  therapy.  He has diabetes, which required insulin at his last  hospitalization, but per his wife requires only metformin and Glipizide  at home.  An echocardiogram was obtained, September of 2007,  demonstrating an ejection fraction of 60%.   He has been having trouble breathing since Saturday, April 21, 2006,  the last 48 hours.  He has been feeling worse throughout that time.  The  last 2 nights he had to sit up to keep his breath.  He was been worn  out, fatigued, he cannot walk anywhere without loosing his breath.  His  legs  are swollen.  He is not having chest pain, however; nor does he  have palpitation or pre/syncope.  He cannot be on Coumadin for his  atrial fibrillation because he has spontaneous psoas muscle hemorrhage  in the past.   PAST MEDICAL HISTORY:  1. Coronary artery disease, preserved left ventricular function,      ejection fraction 60% September of 2007.      a.     Catheterization August of 2004, the left main was without       significant disease; the LAD had 50% stenosis after the diagonal;       the circumflex was stented in the mid distal regions, October of       2004; first obtuse marginal had 20% diffuse luminal       irregularities; the right coronary artery had a 30-40% mid-point       stenosis.  2. Diabetes.  The question is, is this patient a candidate for      insulin?  3. Hypertension.  4. COPD/asthmatic bronchitis.  5. Dyslipidemia.  6. History of  DVT/pulmonary embolism.  7. Atrial fibrillation, unable to take Coumadin, secondary to right      psoas bleed.  8. Recent admission with symptomatic brady/tachycardia, status post      pacemaker April 11, 2006.   Electrocardiogram shows the patient is V-paced at 60 beats per minute,  left bundle branch block pattern.  There are retrograde P-waves visible.   MEDICATIONS:  Include:  1. Digoxin 0.125 mg daily.  2. Metoprolol ER 100 mg b.i.d.  3. Pulmicort Respules in the a.m. and p.m.  4. Enteric-coated aspirin 325 mg daily.  5. Xopenex 0.63 mg nebulizers q.4 hours.  6. Atrovent 0.5 mg nebulizer q.4 hours.  7. Advair 500/50 one puff twice daily.  8. Singulair 10 mg daily at bedtime.  9. Lovastatin 20 mg at bedtime.  10.Metformin 500 mg twice daily.  11.Glipizide 5 mg every morning.  12.Clonazepam 0.5 mg at bedtime.  13.Diltiazem 240 mg b.i.d.  14.Multivitamin with iron daily.  15.Nitroglycerin 0.4 mg sublingually every 5 minutes x3 as needed for      chest pain.   SOCIAL HISTORY:  The patient lives in [place]  with his wife.  He does  not smoke, he has never smoked.  Nor, does he take alcoholic beverages  or drugs.  He is a retired Journalist, newspaper.   PHYSICAL EXAMINATION:  VITAL SIGNS:  Temperature 98.8.  Blood pressure  140/62.  Pulse is 60 and regular.  Respirations 28.  Oxygen saturation  is not determined.  GENERAL:  He is alert and oriented x3.  He is in mild distress with  increased work of breathing.  HEENT:  Normocephalic, atraumatic.  Eyes:  Pupils equal, round and  reactive to light.  Extraocular movements are intact.  Sclerae  anicteric.  NECK:  Supple.  No carotid bruits auscultated.  Mild jugular venous  distention.  LUNGS:  Congested, tight with inspiratory and expiratory wheezes.  Diminished bilaterally.  HEART:  Slow and regular.  ABDOMEN:  Obese, fairly tight.  Bowel sounds are present.  Possible  ascites.  EXTREMITIES:  Showed 1/4 to 2/4 edema in the lower extremities.   IMPRESSION:  1. Admit with progressive dyspnea and volume overload the last 48      hours.  2. Pacing at 60 with left bundle branch pattern on EKG.  3. Diabetes.  4. Hypertension.  5. Dyslipidemia.  6. Coronary artery disease, status post stents x2 to the left      circumflex in August of 2004.  He has a preserved ejection fraction      at 60% on echocardiogram September 2007..  7. Chronic obstructive pulmonary disease/asthmatic bronchitis.   PLAN:  Interrogate the device.  This right-off has shown that the  underlying rhythm is sinus rhythm at 49 beats when the pacemaker has  been turned off.  The pacemaker has been reset to 45 beats per minute  and the patient is currently in sinus rhythm at 48.  Plan is to admit  the patient to telemetry, to start the patient on a heart healthy diet.   MEDICAL ADJUSTMENT:  He will receive 80 mg of IV Lasix q.12 hours.  He  will be started on potassium chloride 40 mEq in the morning and 20 mEq in the evening.  He will continue on his pulmonary and diabetic   medicines.   Medical adjustment for his atrial fibrillation is as follows:  Metoprolol will be discontinued and he will start on pindolol 5 mg twice  daily, Cardizem will be  decreased to 120 mg twice daily.  In addition,  after consultation with Dr. Tenny Craw, he will receive Solu-Medrol 60 mg IV  x1 and started on prednisone 10 mg daily.   LABORATORY STUDIES THIS ADMISSION:  The BNP is 1261.  His CK was 28, CK-  MB 1.3, troponin-I 0.02.  His serum electrolytes:  Sodium 139, potassium  4.5, chloride 105, bicarbonate 28, BUN of 17, creatinine 1.2 and glucose  elevated at 230.  Complete blood count:  White cells is 10.4, hemoglobin  12.3, hematocrit 36.4 and platelets 256,000.      Maple Mirza, PA      Pricilla Riffle, MD, [facility]  Electronically Signed    GM/MEDQ  D:  04/23/2006  T:  04/24/2006  Job:  [id number]

## 2010-09-30 NOTE — Discharge Summary (Signed)
[name], [name] NO.:  [id number]   MEDICAL RECORD NO.:  [id number]          PATIENT TYPE:  INP   LOCATION:  3705                         FACILITY:  [facility]   PHYSICIAN:  Madolyn Frieze. Jens Som, MD, FACCDATE OF BIRTH:  [birth year]   DATE OF ADMISSION:  08/01/2006  DATE OF DISCHARGE:  08/04/2006                               DISCHARGE SUMMARY   DISCHARGING PHYSICIAN:  Dr. Olga Millers.   PRIMARY CARDIOLOGIST:  Dr. Karie Soda Datto.   DISCHARGING DIAGNOSES:  1. Chest pain/coronary artery disease status post cardiac      catheterization this admission showing obstructive coronary artery      disease with an occluded circumflex marginal. The patient status      post cardiac catheterization on August 03, 2006 by Dr. Antoine Poche. At      this time  Dr. Antoine Poche felt it would only be of marginal benefit to try to  revascularize the circumflex which had probably closed at least 48 hours  prior to catheterization. He felt there was some risk to patient's  kidneys and recommended aggressive risk factor reduction and medical  management.  1. Renal insufficiency.  2. Hypercholesteremia.  3. Chronic obstructive pulmonary disease.   PAST MEDICAL HISTORY:  Coronary artery disease status post cardiac  catheterization with placement of a Cypher drug-eluting stent to the  circumflex, residual CAD of 50% in the LAD. OM1 had 20% and RCA 30-40%,  medically treated.  1. History of percutaneous intervention of stent to the distal left      circumflex.  2. Preserved left ventricular function with an EF of 60% by      echocardiogram in September 2007. The patient is status post      echocardiogram this admission showing overall left ventricular      systolic function still within normal range.  3. History of atrial fibrillation with intolerance to Coumadin      secondary to right psoas muscle bleed while on Coumadin. It has      been discontinued.  4. History of sick sinus  syndrome/tachybrady syndrome status post      placement of a Medtronic adaptive pacemaker upgrade in December      2007.  5. History of COPD, asthmatic bronchitis requiring chronic steroids.  6. Remote history of DVT.  7. History of PE in August 2005.  8. History of diabetes type 2.  9. Hyperlipidemia.  10.Hypertension  11.Intolerance to NSAIDs and insect venom.   HOSPITAL COURSE:  [name] is an 77 year old Caucasian gentleman  with medical history as stated above who was in his usual state of  health when he began having increased feelings of general fatigue. He  denied any chest pain or shortness of breath at that time. Around 30  minutes later he developed substernal chest discomfort rating at 10 out  of a 1-10 scale. The patient took nitroglycerin sublingual without  relief. He called EMS. Apparently was given nitro by EMS en route which  relieved his pain. On initial exam, the patient found to be afebrile,  blood pressure 98/65, pulse 79, sat 97% on room air.  Chest x-ray showed  no acute disease. EKG at a rate of 68. Atrial pacing with no acute  ischemic changes. However, the patient was found to have a BUN 22 and  creatinine 1.74. Initial cardiac enzymes were negative. H&H stable at 13  and 39. The patient was admitted. Continued his home medications.  Glucophage was held pending plans for repeat cardiac catheterization.  The patient was found to have mildly elevated cardiac markers with a  troponin 0.09. The patient to the cath lab on the 21st by Dr. Antoine Poche.  Results as stated above.  The patient tolerated catheterization without  complications. Postcath site stable. Dr. Jens Som in to see patient on  day of discharge. Patient afebrile, blood pressure 118/65, heart rate  84.  The patient satting 97% on room air. Cath site stable. H&H 13.1 and  38.7. BUN and creatinine stable at 25 and 1.4. Note the patient's  diuretic was held during hospitalization secondary to renal   insufficiency which was noted on initial lab work. The patient also  received some IV fluids pre and post cath secondary to the mild renal  insufficiency. The patient is being discharged home with instructions to  continue previous medications prior to admission and follow up with Dr.  Glennon Hamilton in two weeks. He will need to have a BMET checked on this  coming Tuesday. I have called the office and left message for them to  arrange blood work in follow-up appointment. I have given the patient  post cardiac catheterization discharge instructions and reviewed them  with him and his wife.   MEDICATIONS AT DISCHARGE:  1. Prednisone 5 mg daily.  2. Digoxin 0.125 mg daily.  3. The patient takes half a tablet metformin.  4. The patient can resume on August 06, 2006 metoprolol 50 mg daily,      Singulair 10 mg daily, glipizide 5 mg. The patient takes 1/2 tablet      daily. Lovastatin 20, aspirin 325, Pulmicort inhaler b.i.d.,      Xopenex nebs t.i.d., amiodarone 200 mg daily, furosemide 40 mg      daily, potassium 20 mEq daily.   Duration of discharge encounter is 30 minutes.      Dorian Pod, ACNP      Madolyn Frieze. Jens Som, MD, [facility]  Electronically Signed    MB/MEDQ  D:  08/04/2006  T:  08/04/2006  Job:  [id number]

## 2010-09-30 NOTE — H&P (Signed)
[name], HARN NO.:  [id number]   MEDICAL RECORD NO.:  [id number]          PATIENT TYPE:  EMS   LOCATION:  MAJO                         FACILITY:  [facility]   PHYSICIAN:  [name] Holler, MD     DATE OF BIRTH:  24-May-1920   DATE OF ADMISSION:  01/30/2006  DATE OF DISCHARGE:                                HISTORY & PHYSICAL   CHIEF COMPLAINT:  Right leg cramping.   HISTORY OF PRESENT ILLNESS:  [name] is an 77 year old male with a  pertinent past medical history of chronic obstructive pulmonary disease,  hypertension, coronary artery disease, history of atrial flutter in the past  who presents to the emergency department with complaints of right lower  extremity cramping.  Over the past week, the patient has had two episodes  right lower extremity cramping and this is why he presented to the emergency  department.  He has also had bruising of his right lower extremity as well.  Of note, the patient was recently on vacation in [place], and was admitted  to an outside hospital for five days with what sounds like a chronic  obstructive pulmonary disease exacerbation along with atrial fibrillation  with rapid ventricular response.  He on this admission has no complaints of  palpitations, no syncope or presyncope, no chest pain, no shortness of  breath, no paroxysmal nocturnal dyspnea or orthopnea, no lower extremity  swelling.  Upon presentation to the emergency department, the patient was  noted to be in atrial fibrillation with a heart rate in the 140s.  The  patient was started on a Diltiazem drip.   PAST MEDICAL HISTORY:  1. Chronic obstructive pulmonary disease on chronic steroids.  2. Coronary artery disease, status post stent placement to the left      circumflex in 1999, 2004.  3. Diabetes mellitus.  4. Hypertension.  5. Hyperlipidemia.  6. Deep venous thrombosis/pulmonary embolus.  7. History of atrial fibrillation/flutter.  8. Status post  cataract surgery.   ALLERGIES:  No known drug allergies.   SOCIAL HISTORY:  The patient is a nonsmoker, lives with his wife.   CURRENT MEDICATIONS:  1. Prednisone taper.  2. Cardizem CD 360 mg by mouth daily.  3. Digoxin 0.125 mg by mouth daily.  4. Lantus 12 units subcutaneous daily.  5. Advair inhaler 500/50 twice daily.  6. Singulair 10 mg by mouth daily.  7. Coumadin.  8. Lovastatin.   FAMILY HISTORY:  No known history of coronary artery disease.   REVIEW OF SYSTEMS:  All systems reviewed in detail and are negative except  as noted in history of present illness.   PHYSICAL EXAMINATION:  VITAL SIGNS:  Temperature 98.6, heart rate 146, blood  pressure 152/86, oxygen saturations 95% on room air.  GENERAL:  Elderly male, alert and oriented x3, in no acute distress.  HEENT:  Normocephalic and atraumatic.  Pupils equal, round and reactive to  light.  Extraocular movements are intact.  Oropharynx clear.  NECK:  Supple, no adenopathy, no jugular venous distention.  CHEST:  Decreased breath sounds throughout with diffuse wheezing throughout.  CORONARY:  Irregularly irregular, tachycardic, no murmurs, rubs or gallops,  2+ peripheral pulses.  ABDOMEN:  Soft, nontender, nondistended, active bowel sounds, no  hepatosplenomegaly.  EXTREMITIES:  Right lower extremity ecchymosis in the right lateral thigh,  no cyanosis, clubbing or edema.  NEUROLOGIC:  No focal deficits.   EKG:  Atrial fibrillation with rapid ventricular response, lateral ST and T  wave changes.   LABORATORY DATA:  White blood cell count 19,300, hematocrit 46.7, platelet  count 216,000, PTT of 58, INR of 6.2, digoxin 0.7, sodium 130, potassium  5.1, chloride 100, bicarb 22, BUN 32, creatinine 1.5, glucose 512.   IMPRESSION AND PLAN:  [name] is an 77 year old male who presents with  atrial fibrillation, rapid ventricular response, supratherapeutic INR on  Coumadin with right lower extremity ecchymosis.    PLAN:  1. Cardiovascular:  Admit the patient to a step down unit, continue the      patient on a Cardizem drip, continue the patient on his home dose of      Cardizem and digoxin  Serial cardiac enzymes to rule out myocardial      infarction.  Transthoracic echocardiogram in the morning.  2. Pulmonary:  Continue the patient on his home pulmonary medicines      including prednisone.  3. Hematologic:  Hold the patient's Coumadin secondary to an INR of 6.2.  4. Fluids, electrolytes and nutrition:  Put the patient on normal saline      at 100 mL per hour during the night time.  Place the patient n.p.o.      after midnight.  Place the patient on sliding scale insulin for glucose      coverage.  In the emergency department the patient was given insulin      and had a drop in his glucose from 512 to 220.  Of note, the patient      does not have an anion gap acidosis.  5. Thyroid function tests.      [name] Holler, MD  Electronically Signed     TRK/MEDQ  D:  01/31/2006  T:  02/01/2006  Job:  [id number]

## 2010-09-30 NOTE — Discharge Summary (Signed)
NAME:  [name], [name]                       ACCOUNT NO.:  [id number]   MEDICAL RECORD NO.:  [id number]                   PATIENT TYPE:  OIB   LOCATION:  6526                                 FACILITY:  [facility]   PHYSICIAN:  Arturo Morton. Riley Kill, M.D.             DATE OF BIRTH:  1921-01-11   DATE OF ADMISSION:  12/17/2002  DATE OF DISCHARGE:  12/18/2002                           DISCHARGE SUMMARY - REFERRING   SUMMARY OF HISTORY:  [name] is an 77 year old white male who was seen  by E. Graceann Congress, M.D. on November 25, 2002 for decreased exercise  tolerance, dyspnea on exertion, and easy fatigability.  Dr. Corinda Gubler noted  that the patient had not been having any chest discomfort.  He has a history  of coronary artery disease with angioplasty and stenting to the circumflex  in 1999 with normal LV function at that time.  He also has a history of  chronic asthma and COPD followed by Clinton D. Young, M.D. as well as  hypertension, obesity, hyperlipidemia, and type 2 diabetes.   LABORATORY DATA:  Preadmission H&H was 14.7 and 43.7, normal indices,  platelets 255, WBCs 15.  Sodium 140, potassium 4.5, BUN 19, creatinine 1.2,  glucose 130.  PT 11.4, PTT 27.6.  It it noted that labs drawn on July 20th  show a total cholesterol of 144, triglycerides 92, HDL 38, LDL 88, normal  LFTs.  EKGs show normal sinus rhythm, left axis deviation, left anterior  hemiblock, early R waves, nonspecific ST-T wave changes.   Postprocedure H&H was 12.9 and 38.1, platelets 173, WBCs 12.1.  Sodium 142,  potassium 4, BUN 18, creatinine 1.3, glucose 104, CK total MB 55 and 3.3.   HOSPITAL COURSE:  [name] was brought in for an outpatient cardiac  catheterization to evaluate his dyspnea on exertion and abnormal Cardiolite  that was performed on December 02, 2002 showing an EF of 61% and inferior  ischemia.  On December 17, 2002, Dr. Riley Kill performed cardiac catheterization  via the left femoral site without  difficulty.  According to Dr. Rosalyn Charters  progress note he had an EF of 55%, a 30-40% mid RCA, a 30-40% proximal  circumflex, 80% mid circumflex, 20% proximal LIMA, 40-50% proximal LAD.  The  stent at the proximal circumflex was patent.  After review, Dr. Riley Kill  performed Cypher stenting to the mid circumflex reducing the 80% lesion to  0%.  Sheath removal and bed rest.  He was ambulating without difficulty.  Catheterization site did show some evolving ecchymosis.  He did not have a  bruit.  Distal pulses were present.  After review, Dr. Riley Kill felt that the  patient could be discharged home.   DISCHARGE DIAGNOSES:  Dyspnea on exertion, easy fatigability, ischemia  equivalent, __________ adenosine Cardiolite as previously described,  progressive coronary artery disease as previously described status post  stenting of the circumflex as previously described.   DISPOSITION:  The  patient is discharged home.   MEDICATIONS:  1. Plavix 75 mg daily for six months.  2. Coated aspirin 325 mg daily.  3. Prednisone 10 mg daily.  4. Multivitamin daily.  5. Lipitor 20 mg q.h.s.  6. Xalatan eye drops as previously.  7. Cozaar 50 mg daily.  8. Advair 250/50 as previously.  9. Glipizide 5 mg daily.  10.      Calcium 500 mg two tablets daily.  11.      Resume Glucophage 500 mg on Friday.  12.      Zoloft 25 mg daily.  13.      Nitroglycerin 0.4 mg as needed.  14.      Albuterol nebulizers four times a day.   INSTRUCTIONS:  He was advised no lifting, driving, sexual activity, or heavy  exertion for 2 days.  To maintain a low salt, fat, cholesterol ADA diet.  If  he had any problems with the catheterization site he was asked to call  immediately.  He will see Dr. Madelaine Etienne. on August 19th at 12 p.m.  He  was advised no MRI for six weeks, antibiotics with dental and GI procedures  for three months.      Joellyn Rued, P.A. [facility]                    Thomas D. Riley Kill, M.D.    EW/MEDQ  D:   12/18/2002  T:  12/18/2002  Job:  [id number]   cc:   Cecil Cranker, M.D.   Juanito Doom, M.D.  [address]  [place], [place] [address]   Joni Fears D. Young, M.D.  [address] [place]  [place] [address]  Fax: [phone]

## 2010-09-30 NOTE — H&P (Signed)
NAMEJOSHA, [name] NO.:  [id number]   MEDICAL RECORD NO.:  [id number]          PATIENT TYPE:  INP   LOCATION:  3705                         FACILITY:  [facility]   PHYSICIAN:  Luis Abed, MD, FACCDATE OF BIRTH:  27-Oct-1920   DATE OF ADMISSION:  08/01/2006  DATE OF DISCHARGE:                              HISTORY & PHYSICAL   PRIMARY CARDIOLOGIST:  Cecil Cranker, MD   CHIEF COMPLAINT:  Chest pain.   HISTORY OF PRESENT ILLNESS:  [name] is an 77 year old male with a  history of coronary artery disease as well as atrial fibrillation and  sick sinus syndrome, who was in his usual state of health and was  ambulating but then he developed a feeling of general malaise which he  is unable to further specify.  He initially denied chest pain, shortness  of breath but because he felt bad, he returned home.  About 30 minutes  later he developed acute 10/10 substernal sharp chest pain.  He was  sitting at the time that the pain started.  There were no associated  symptoms.  He had some sublingual nitroglycerin of his own and used one  but did not think that they were effective.  He called EMS and he was  given nitroglycerin by EMS, which relieved his pain.  He is currently  asymptomatic.  He had a recent upper respiratory illness that was  associated with fever, chills and yellowish sputum and cough, but he has  completed his antibiotics.  He is otherwise recently been symptom-free.   PAST MEDICAL HISTORY:  1. Status post cardiac catheterization and Cypher stent to the      circumflex.  Residual coronary artery disease of 50% in the LAD, OM-      1 20% and RCA 30-40%, that has been treated medically.  2. Remote history of percutaneous intervention and stent to the distal      left circumflex.  3. Preserved left ventricular function with an EF of 60% by      echocardiogram in September 2007.  4. History of atrial fibrillation.  5. History of a right psoas  muscle bleed, spontaneous on Coumadin,      which was then discontinued.  6. History of sick sinus syndrome/tachybrady syndrome, status post      Medtronic Adapta pacemaker upgrade in December 2007.  7. History of COPD/asthmatic bronchitis on chronic steroids.  8. Remote history of DVT.  9. History of PE in August 2005.  10.History of diabetes.  11.Hyperlipidemia.  12.Hypertension.   SURGICAL HISTORY:  He is status post cardiac catheterization x2 as well  as permanent pacemaker and upgrade to dual-lead device.   ALLERGIES:  He is allergic or intolerant to NSAIDS and INSECT VENOM.   CURRENT MEDICATIONS:  1. Prednisone 5 mg a day.  2. Digoxin 0.125 mg daily.  3. Metformin 5 mg b.i.d.  4. Metoprolol ER 50 mg daily.  5. Singulair 10 mg a day.  6. Glipizide 5 mg 1-1/2 tablet daily.  7. Lovastatin 20 mg a day.  8. Sublingual nitroglycerin p.r.n.  9. Aspirin 325 mg  daily.  10.Pulmicort 0.25 mg inhaled b.i.d.  11.Xopenex 1.25 mg nebulized t.i.d.  12.Amiodarone 200 mg a day.  13.Lasix 40 mg daily.  14.Potassium 20 mEq a day.   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  He has no history of alcohol, tobacco or drug  abuse and tries to exercise daily.   FAMILY HISTORY:  His mother died at age 84 of cancer and his father died  at a young age and was killed.  He has two sibling status, neither of  whom have coronary artery disease.   REVIEW OF SYSTEMS:  He has lost 10 pounds the last few weeks.  The upper  respiratory illness was associated with fevers, chills and sweats but  those have improved.  He still has some dizziness with exertion.  He has  hearing loss and is deaf in his left ear.  He still has an occasional  cough.  The chest pain is described above.  He has occasional  arthralgias.  He denies hematemesis, hemoptysis or melena.  Review of  systems is otherwise negative.   PHYSICAL EXAMINATION:  VITAL SIGNS:  Temperature is  97.1, blood pressure  after nitro was 98/65, now is 133/62, pulse 79, respiratory rate 20, O2  saturation 97% on room air.  GENERAL:  He is a well-developed, well-nourished white male in no acute  distress.  HEENT:  Head is normocephalic and atraumatic with extraocular movements  intact.  Sclerae are clear.  Nares without discharge.  NECK:  There is no lymphadenopathy, thyromegaly, bruit or JVD noted.  CARDIOVASCULAR:  His heart is regular in rate and rhythm with an S1-S2  and no significant murmur, rub or gallop is noted.  LUNGS:  Coarse rhonchi at the bases and a slight expiratory wheeze.  SKIN:  No rashes or lesions are noted.  ABDOMEN:  Soft and nontender with active bowel sounds.  There is no  hepatosplenomegaly by palpation.  EXTREMITIES:  There is no cyanosis, clubbing or edema.  MUSCULOSKELETAL:  There is no joint deformity or effusions and no spine  or CVA tenderness.  NEUROLOGIC:  He is alert and oriented with cranial nerves II-XII grossly  intact.   Chest x-ray shows no acute disease.   EKG is rate 68, atrial pacing, with no acute ischemic changes.   LABORATORY VALUES:  Hemoglobin 13.0, hematocrit 39.0, WBC 16.0,  platelets 292.  Sodium 139, potassium 4.9, chloride 107, CO2 25, BUN 22,  creatinine 1.74, glucose 156.  Initial CK, MB and troponin are negative.   IMPRESSION:  Chest pain.  [name] has no history of recent chest  pain.  He is generally able to exert himself without symptoms.  He has  been a little weak recently because of his upper respiratory illness but  otherwise felt pretty good today until he had sudden onset of symptoms  of general malaise and slight weakness that was then followed by chest  pain.  His symptoms resolve with sublingual nitroglycerin.  He will be  admitted overnight.  A myocardial infarction will be ruled out.  We will  not anticoagulate him with heparin or use IV nitroglycerin unless his symptoms return.  He will be continued on his  home medications including  full-strength aspirin.  His Glucophage will be held pending the  possibility of a test involving dye.  If his enzymes remain negative and  his EKG remains stable, then he will be discharged home  in a.m. with an  outpatient Myoview.  If his cardiac enzymes are elevated, cardiac  catheterization is indicated.  His white count is slightly elevated at  16,000 but he has been sick recently and is on prednisone.  He is  afebrile and his chest x-ray is without acute disease.  He has no  symptoms of a urinary tract infection so no antibiotic therapy will be  used at this time.  His pacemaker was recently upgraded and the office  note from May 21, 2005, showed normal functioning and 28 mode switch  episodes totaling 2.4% of the time.  He is currently atrial pacing and  stable.  Mr. Bee will be continued on his home medications with the  exception of amiodarone.  Because of his recent illness and a slight  elevation in his creatinine, the Lasix and potassium will be held as  well.      Theodore Demark, PA-C      Luis Abed, MD, [facility]  Electronically Signed    RB/MEDQ  D:  08/01/2006  T:  08/02/2006  Job:  [id number]

## 2010-09-30 NOTE — Cardiovascular Report (Signed)
NAMEJAYLIN, [name] NO.:  [id number]   MEDICAL RECORD NO.:  [id number]          PATIENT TYPE:  INP   LOCATION:  3705                         FACILITY:  [facility]   PHYSICIAN:  Rollene Rotunda, MD, FACCDATE OF BIRTH:  1920/11/15   DATE OF PROCEDURE:  08/03/2006  DATE OF DISCHARGE:                            CARDIAC CATHETERIZATION   DATE OF PROCEDURE:  08/03/2006.   PROCEDURE:  Left heart catheterization.  Arteriography.   INDICATIONS:  A patient with chest pain and previous stenting to his  circumflex x2.   PROCEDURE NOTE:  Left heart catheterization was performed via the right  femoral artery.  The artery was cannulated using anterior wall puncture.  A #6-French arterial sheath was inserted via the modified Seldinger  technique.  Preformed Judkins and a pigtail catheter were utilized.  Of  note, the patient was adequately hydrated.  I used minimal dye and  ensured that his BUN and creatinine were falling prior to the  catheterization.   RESULTS:  Pressures:  LV 153/22, AO 154/99.   Coronaries:  The left main was normal.  The LAD had a proximal long 30%  stenosis.  There was a long proximal 30-40% stenosis after the first  septal perforator.  The first diagonal was moderate size and normal.  The second diagonal had proximal 25% stenosis.  The circumflex had a  proximal stent with luminal irregularities.  There was a first obtuse  marginal which was large with luminal irregularities.  There was a  second obtuse marginal which was occluded.  It appeared to be occluded  before the stent with trivial flow up to the stent but not through the  stent which was in the mid obtuse marginal.  There was some scant left-  to-left collateral flow demonstrated.  The right coronary artery was a  dominant vessel.  There was a long proximal 25% stenosis.  There was a  mid 60% stenosis after an acute marginal.  PDA was small and normal.  A  posterolateral was small and  normal.   Left ventriculogram:  The left ventricle was not injected secondary to  renal insufficiency.  We did cross for pressures.   CONCLUSION:  Obstructive coronary disease with an occluded circumflex  marginal.   PLAN:  I have reviewed the patient's clinical history, old cath films  and echocardiogram.  At this time, there may be only marginal benefit to  try to revascularize the circumflex which probably closed at least 48  hours ago.  There is some substantial risk to his kidneys.  He did not  have a large defect on his echo or a large enzyme elevation.  He has  had some fleeting chest pain this morning but no severe symptoms since  the admitting event.  At this point, we are going to manage him  medically with aggressive risk reduction.  I have reviewed this  extensively with the patient and his wife.      Rollene Rotunda, MD, [facility]  Electronically Signed     JH/MEDQ  D:  08/03/2006  T:  08/03/2006  Job:  [id number]   cc:  Gabriel Earing, M.D.  Cecil Cranker, MD, [facility]

## 2010-09-30 NOTE — Cardiovascular Report (Signed)
NAME:  [name], [name]                       ACCOUNT NO.:  [id number]   MEDICAL RECORD NO.:  [id number]                   PATIENT TYPE:  OIB   LOCATION:  6599                                 FACILITY:  [facility]   PHYSICIAN:  Arturo Morton. Riley Kill, M.D.             DATE OF BIRTH:  03-07-1921   DATE OF PROCEDURE:  12/17/2002  DATE OF DISCHARGE:                              CARDIAC CATHETERIZATION   INDICATIONS:  The patient is an 77 year old who has had declining exercise  tolerance, exertional dyspnea, and abnormal Cardiolite demonstrated in an  inferior and inferoapical defect.  The current study was done to assess  coronary anatomy.  He has had prior stenting in the proximal circumflex  coronary artery.   PROCEDURES:  1. Left heart catheterization.  2. Selective coronary arteriography.  3. Selective left ventriculography.  4. Percutaneous transluminal coronary angioplasty and stenting of the     circumflex coronary artery.   DESCRIPTION OF PROCEDURE:  The procedure was performed from the right  femoral artery using 6-French catheters.  Overall, he tolerated the  diagnostic procedure well and there were no complications.  He was noted to  have a widely patent stent at the previous site of stenting but a  progressive lesion distally in the circumflex system.  He also had a  moderate lesion of the left anterior descending artery and a mild lesion in  the mid right coronary artery.  I reviewed the films with Carole Binning,  M.D. [facility], and we elected to recommend percutaneous stenting of the  circumflex.  The circumflex was then successfully stented using a JL3.5  guiding catheter, high torque floppy wire, heparin, and Integrilin according  to protocol, and a 2.5 x 18 Cordis Cypher stent.  The vessel was directly  stented up to about 12-13 atmospheres.  Post dilatation was done with a 2.5  PowerSail throughout the confines of the stent, making sure to stay slightly  within the edges.   He tolerated the procedure and there were no  complications.  He was taken to the holding area in satisfactory clinical  condition.  A 6-French sheath was used.  ACTs were appropriate.   HEMODYNAMIC DATA:  1. The central aortic pressure was 150/73.  2. Left ventricle 140/13.  3. No gradient on pullback across the aortic valve.   ANGIOGRAPHIC DATA:  1. The left main has slight eccentricity, but with deep engagement of the     guide, there was no evidence of significant focal obstruction when the     left main was fully filled.  Moreover, there was no damping.  2. The left anterior descending artery courses to the apex.  Importantly,     there is a eccentric but fairly smooth appearing stenosis of about 40-     50%.  The minimum lumen diameter appears to be about 2 mm in its most     severe area.  This was  just after the diagonal and fairly proximal.     However, in most views, it does not appear to be critical.  3. The circumflex is stented proximally and appears to be widely patent.     There is a large first marginal with 20% segmental plaquing throughout     the proximal vessel.  There is 30-40% segmental narrowing in the mid     circumflex which looks slightly worse in the LAO view.  Then, there is a     high grade, hazy stenosis in the mid to distal circumflex that extends     over to about 8 to 10 mm.  Following stenting with an 18 mm Cypher stent,     this vessel appeared to be widely patent with excellent runoff, and no     evidence of edge dissection.  4. The right coronary artery provides a single PDA and had about 30-40%     segmental narrowing in its mid vessel, which was demonstrated mostly in     the left lateral view.  5. Ventriculography in the RAO projection revealed preserved global systolic     function, no segmental abnormalities or contractures were identified.     Ejection fraction appeared to be in excess in 55%.   CONCLUSIONS:  Successful percutaneous stenting  of the distal circumflex  coronary artery.   The patient's primary symptoms have been dyspnea.  He has had declining  exercise tolerance.  I had a long discussion with his wife and the patient,  telling them that it was not entirely clear to me that he would clearly have  a definite reduction in his symptoms from this.  However, he notices there  has been a difference in declining exercise tolerance and there is an  inferior defect by Cardiolite.  We, therefore, felt that it would be best to  go ahead and stent this to see if he does get symptomatic improvement.  The  LAD also potentially in the future could be a source of ischemia but at the  present time, does not appear to be flow limiting, or nearly as much as the  circumflex.  Carole Binning, M.D. [facility], and I were in agreement with this  and it was our feeling that circumflex stenting might potentially help the  patient.  He will be discharged tomorrow on aspirin and Plavix for a minimum  of three months.   DISPOSITION:  @@                                               Arturo Morton. Riley Kill, M.D.    TDS/MEDQ  D:  12/17/2002  T:  12/17/2002  Job:  [id number]   cc:   Areatha Keas, M.D.  [address]  [address]  [place]  [place] [address]  Fax: [phone]   Clinton D. Young, M.D.  [address] [place]  [place] [address]  Fax: [phone]   E. Graceann Congress, M.D.   [facility]

## 2010-09-30 NOTE — Cardiovascular Report (Signed)
[name]             ACCOUNT NO.:  [id number]   MEDICAL RECORD NO.:  [id number]          PATIENT TYPE:  INP   LOCATION:  3741                         FACILITY:  [facility]   PHYSICIAN:  Everardo Beals. Juanda Chance, MD, FACCDATE OF BIRTH:  1921/03/24   DATE OF PROCEDURE:  04/26/2006  DATE OF DISCHARGE:                            CARDIAC CATHETERIZATION   CLINICAL HISTORY:  [name] is 77 years old and has coronary artery  disease with prior percutaneous coronary interventions, normal left  ventricular function and sick sinus syndrome with atrial fibrillation.  A month ago he had a pacemaker inserted because of tachycardia and  bradycardia.  He was admitted to the hospital on April 23, 2006 with  shortness of breath felt related to volume overload and was initially in  sinus rhythm.  Dr. Graciela Husbands saw him in consultation and because of the  sinus rhythm and after discussion with Dr. Corinda Gubler and the patient  recommend upgrade of his pacemaker to a DDD pacemaker with the hopes of  perhaps obtaining rhythm control with amiodarone.  The patient has not  been able to take Coumadin due to a right psoas bleed despite the fact  he has had an atrial fibrillation and a history of previous DVT and  pulmonary embolism.   PROCEDURE:  Explantation of the old Medtronic VVI pacemaker ([id number],  [id number]), inspection of the old ventricular lead (model [id number], serial  number [id number]) insertion of a new atrial lead (Medtronic [id number],  serial number [id number]) and insertion of a new DDD pacemaker (Adapta  R01, serial number [id number]).   ANESTHESIA:  Xylocaine 1% local.   ESTIMATED BLOOD LOSS:  Less than 15 mL.   COMPLICATIONS:  None.   PROCEDURE:  The procedure was performed in room #3.  The left anterior  chest was prepped and draped in the usual fashion.  A venogram was  performed to assess the patency of the left subclavian vein.  The skin  and subcutaneous tissue anesthetized with 1%  local Xylocaine.  An  incision was made just below the previous incision and stented to the  pocket.  The pocket was opened and the generators removed.  Using an 18  gauge thin-wall needle, we accessed the subclavian vein.  Using a 9-  French sheath and a retained wire, we advanced an atrial lead to the  right atrium.  The atrial lead was screwed into the right atrial  appendage with good pacing parameters described below.  After removal of  the stylet, the atrial lead was secured at the entry site with a suture  of 1-0 silk.  The atrial lead was attached to the posterior aspect of  the pocket using two sutures of 1-0 silk around the Silastic collar.  The pocket was irrigated with sterile kanamycin solution.  The old  ventricular lead and the new atrial lead were attached to the new  Medtronic Adapta DDD pacemaker.  The pacer was then implanted into the  pocket and secured loosely to the prepectoral fascia with one suture of  1-0 silk.  The subcutaneous tissue was closed with running 2-0  __________  and the skin was closed with running 6-0 __________ .  The  patient tolerated the procedure well and left the laboratory in  satisfactory condition.   PACING PARAMETERS:  Atrium:  Fibrillation waves range from 0.8-1.5 mV.  The impedance was 540 ohms.  There was no threshold to the atrial  fibrillation.   V-wave:  R-wave 19.4 mV.  Impedance 635 ohms.  Minimum threshold capture  0.6 volts at a pulse width of 0.5.   The patient was in atrial fibrillation throughout the procedure and  initially the rate was quite fast and this was treated with a Cardizem  drip.  Otherwise, he tolerated the procedure well and left the  laboratory in satisfactory condition.      [name] Juanda Chance, MD, [facility]  Electronically Signed     BRB/MEDQ  D:  04/26/2006  T:  04/26/2006  Job:  [id number]   cc:   Cecil Cranker, MD, [facility]  Duke Salvia, MD, [facility]  Cardiopulmonary Lab

## 2010-09-30 NOTE — Assessment & Plan Note (Signed)
[facility]                              CARDIOLOGY OFFICE NOTE   NAME:Yarberry, CILLIAN GWINNER                    MRN:          [id number]  DATE:02/12/2006                            DOB:          1921/03/22    A very pleasant 77 year old white married male with history of COPD,  hypertension, coronary artery disease, atrial flutter.  He recently was  hospitalized in [place] with acute exacerbation of his pulmonary  disease.  He subsequently came here on January 30, 2006 with right leg  cramping and was found to have a large right psoas muscle hematoma and  hematoma involving the right upper thigh.  He was in atrial fibrillation at  that time with rate in the 140s; he was started on IV diltiazem.  His INR  was 6.2.  He converted to sinus rhythm on the afternoon of September 19,  diltiazem switched to p.o. and it was felt that he should remain off his  Coumadin.  His hemoglobin fell from 14.6 down to 8.7.  CT of the abdomen and  pelvis revealed acute intramuscular hematoma involving the right psoas  muscle; he was given vitamin K.  He required a unit of packed red cells on  September 23 and his hemoglobin stabilized at 10.5 at the time of discharge.  He reverted back to atrial fibrillation and at the time of discharge, he was  sent home on Lopressor 50 mg b.i.d., prednisone 10 mg, Cardizem CD 360 mg,  Lantus 18, Advair 500/50, Singulair 10 mg, Lovastatin 20 mg, clonazepam 0.5  mg, albuterol, Nystatin q.i.d.   He is now ambulating much improved and hematomas in the thigh and lumbar  region are visibly resolved.   PHYSICAL EXAM:  Blood pressure 146/68, pulse 58, normal sinus rhythm.  GENERAL APPEARANCE:  Normal.  JVP is not elevated.  LUNGS:  Clear.  CARDIAC:  Exam reveals no murmur or gallop.  There are minimal residual ecchymoses in the lumbar region.  His thigh hematoma has resolved.   IMPRESSION:  Diagnoses as above including:  1.  Spontaneous psoas hematoma with significant blood loss requiring      transfusion.  2. Paroxysmal atrial fibrillation, presently in normal sinus rhythm.   We plan to continue him off the Coumadin, but will increase his aspirin to  325 mg because of his diabetes.  I advised him to remain on the same therapy  and we plan to get a hemoglobin and hematocrit, lipid profile, LFTs and BNP  today and I will see him back in 3 or 4 weeks or as  needed.  I have suggested he contact Dr. Maple Hudson concerning his prednisone and  other pulmonary status.            ______________________________  E. Graceann Congress, MD, [facility]     EJL/MedQ  DD:  02/12/2006  DT:  02/13/2006  Job #:  [id number]

## 2010-09-30 NOTE — H&P (Signed)
[name]. [facility]  Patient:    [name], [name] Visit Number: [id number] MRN: [id number]          Service Type: MED Location: [id number] Attending Physician:  Jetty Duhamel Driver Dictated by:   Charlaine Dalton. Sherene Sires, M.D. [facility] Admit Date:  08/03/2001   CC:         [facility]  Clinton D. Maple Hudson, M.D.  Cecil Cranker, M.D. [facility]  Lacretia Nicks Chase Picket, M.D.   History and Physical  PRIMARY:  [facility]  PULMONARY DOCTOR:  Clinton D. Maple Hudson, M.D.  CARDIOLOGIST:  Cecil Cranker, M.D.  RHEUMATOLOGIST:  Helene Kelp, M.D.  HISTORY OF PRESENT ILLNESS:  This is a 77 year old white male, a never smoker, with a longstanding history of asthma, last admitted here on January 29, 2000, with a three-week history of progressive dyspnea with what I thought then might be a component of vocal cord dysfunction.  He had actually done since this admission, "well," without any exacerbations requiring the emergency room until the last three weeks when he has had a progressive decline with increasing dyspnea over baseline and a hacking cough productive of minimally discovered sputum.  He has already been seen by [facility] D. Young, M.D., and placed on a round of antibiotics 10 days ago with no apparent improvement.  He came to the emergency room at 1:30 a.m. this morning and has been treated vigorously by the emergency room staff with IV steroids, multiple inhalers, and magnesium sulfate.  He continues to be "tight."  Therefore, I was asked to admit him.  The patients baseline is that he can barely make it to the mailbox and back without giving out.  He uses handicapped parking.  He is not able to go up any steps.  He is using his nebulizer at least four times a day even on his "good days."  With this exacerbation, he denies any pleuritic pain, fevers, chills, sweats, sinus or reflux symptoms, chest pain, nausea, vomiting, and diarrhea.  PAST MEDICAL HISTORY: 1.  AODM, type 2. 2. Obesity exacerbated by steroids. 3. Chronic arthritis, steroid dependent. 4. BPH, status post TURP. 5. Ischemic heart disease, status post stent in June of 1999 with normal LV    function documented then.  ALLERGIES:  ASPIRIN and NONSTEROIDALS:  He has INSECT VENOM hypersensitivity as well.  MEDICATIONS:  On his last admission, the patient told me "check with my wife" in terms of his medications.  Today his wife is with him and has a medication card which contains Altace 10 mg daily.  She thinks that he has only been on this for approximately six weeks.  It also contains Glucotrol 5 mg daily, Lipitor 20 mg daily, multiple eyedrops for glaucoma (no beta blockers though), and prednisone 10 mg daily, but albuterol is not on the list.  SOCIAL HISTORY:  He has never smoked.  He lives at home with his wife.  He does have handicapped parking as noted.  FAMILY HISTORY:  Negative for respiratory disease or asthma.  REVIEW OF SYSTEMS:  Taken in detail and the worksheet is essentially negative, except as noted above.  PHYSICAL EXAMINATION:  This is an obese white male, able to sit back at approximately 45 degrees.  Markedly hoarse with mild increased work of breathing.  VITAL SIGNS:   He is afebrile with normal vital signs.  HEENT:  Remarkable for the absence of thrush.  The oropharynx is clear.  NECK:  Supple without cervical adenopathy,  tenderness, or thyromegaly.  CHEST:  Hyperresonant to percussion with prominent pseudowheezing bilaterally that only partially resolves with purse lip.  There is hyperresonance to percussion.  HEART:  There is regular rate and rhythm without murmurs, rubs, or gallops present.  ABDOMEN:  Markedly obese and distended, but otherwise benign.  He says this is his "baseline."  EXTREMITIES:  Warm without calf tenderness, clubbing, cyanosis, or edema. Pedal pulses are intact.  NEUROLOGIC:  No focal deficits.  SKIN:  Warm and dry with  no lesions.  LABORATORY DATA:  The chest x-ray revealed hyperinflation.  The saturation is in the low 90s on 2 L.  IMPRESSION: 1. Refractory symptoms of wheezing and dyspnea almost identical to his past    admission on January 29, 2000.  At that time, I though that he had    prominent pseudowheezing superimposed on chronic asthma.  He was started on    PBI therapy in the hospital to cover the possibility of GERD-induced vocal    cord dysfunction.  Now I realize that he is actually also on ACE    inhibitors, although it is not clear to me based on not knowing what his    medications were before, whether he was actually taking ACE inhibitors then    (seems unlikely since he did so well for over a year on the outpatient    regimen).  In any case, I think it is time to try him off of ACE inhibitors    and treat reflux vigorously to see if we can improve both his acute    exacerbation in the hospital and then also improve his day to day function    at home. 2. Morbid obesity with marked restrictive component.  This limits his    ventilatory reserve and also puts him at risk of higher gastric pressures    and reflux.  I had ordered a TSH on his previous admission, but do not see    one in the records.  Will check on this admission. 3. Diabetes mellitus, type 2.  This may flare with steroids.  Will place him    on a loose sliding scale insulin regimen. Dictated by:   Charlaine Dalton. Sherene Sires, M.D. [facility] Attending Physician:  Jetty Duhamel Driver DD:  16/10/96 TD:  08/04/01 Job: [id number]

## 2010-09-30 NOTE — Consult Note (Signed)
[name], [name] NO.:  [id number]   MEDICAL RECORD NO.:  [id number]          PATIENT TYPE:  INP   LOCATION:  2023                         FACILITY:  [facility]   PHYSICIAN:  Doylene Canning. Ladona Ridgel, MD    DATE OF BIRTH:  Jan 25, 1921   DATE OF CONSULTATION:  04/10/2006  DATE OF DISCHARGE:                                 CONSULTATION   ELECTROPHYSIOLOGY CONSULTATION:  The consultation is requested by Dr. Graceann Congress.   INDICATION FOR CONSULTATION:  Evaluation of symptomatic tachybrady  syndrome.   HISTORY OF PRESENT ILLNESS:  The patient is an 77 year old male with a  history of COPD and chronic atrial fibrillation.  He had a spontaneous  retroperitoneal bleed several weeks ago on Coumadin which was  discontinued.  The patient felt well until 4 days ago when he became  short of breath, fatigued, and had altered consciousness.  The patient  was subsequently taken to the emergency department.  On the way in,  paramedics demonstrated a heart rate in the 20s.  He was found to be  hyperkalemic and treated with calcium chloride and atropine with  improvement.  He was also noted to have a glucose of 556 and a  creatinine of 17.  The patient may have been taking supplemental  potassium in the form of sea salt, exacerbating this problem.  The  patient has, after resolution of his hyperkalemia, developed atrial  fibrillation with a rapid ventricular response which has been difficult  to treat with medical therapy, and now he is on 360 mg a day of calcium  channel blocker and 100 twice a day of metoprolol but still remains with  rapid ventricular response and atrial fibrillation.  Other times, his  heart rate will decrease into the 50 range.   His past medical history is notable for hypertension, diabetes, and  dyslipidemia.  He has a history of coronary disease and is status post  screen to the left circumflex.  He has preserved LV function.  He has  mild mitral regurg.   MEDICATIONS:  Include Lantus insulin, Prednisone, Advair, Singulair,  Zocor, diltiazem, metoprolol, potassium for supplementation as needed,  and Lovenox.   SOCIAL HISTORY:  The patient is married and lives in [place].  He is  a retired Journalist, newspaper.  He denies tobacco or ethanol use.   REVIEW OF SYSTEMS:  Is negative except as noted in the HPI along with  shortness of breath with exertion, dyspnea, and PND.   PHYSICAL EXAMINATION:  Is notable for a pleasant chronically ill-  appearing man in no acute distress.  The blood pressure was 127/80.  The pulse was 105 and irregularly  irregular.  Respirations were 20.  Temperature was 98.  HEENT exam was normocephalic and atraumatic.  Pupils equal and round.  Oropharynx is moist.  Sclerae are anicteric.  The neck reveals 4 cm jugular venous distention.  There was no  thyromegaly.  Trachea was midline.  Carotids were 2+ and symmetric.  CARDIOVASCULAR EXAM:  An irregularly irregular rhythm with normal S1 and  S2.  The PMI was not enlarged nor laterally  displaced.  Lungs revealed scattered wheezes bilaterally but no increased work of  breathing.  There were no rales or no rhonchi appreciated.  Abdominal exam was soft, nontender, nondistended.  There was no  organomegaly.  The bowel sounds were present.  There was no rebounding  or guarding.  Extremities demonstrate no cyanosis, clubbing, or edema.  The pulses  were 2+ and symmetric.  The neurologic exam was alert and oriented x3 with cranial nerves  intact.  Strength was 5/5 and symmetric.   The EKG demonstrated atrial fibrillation with variable ventricular  response.  At times, his EKG suggests typical atrial flutter.   IMPRESSION:  1. Symptomatic tachybrady syndrome.  2. Hyperkalemia exacerbating his bradycardia.  3. Atrial fibrillation and flutter.  4. History of spontaneous retroperitoneal bleed rendering him not a      Coumadin candidate.  5. Known coronary disease with stents  in 1999 and 2004.  6. Chronic obstructive pulmonary disease with asthmatic bronchitis.  7. Hypertension.  8. Dyslipidemia.   DISCUSSION:  The patient has a problem with both tachy and bradycardia.  I have recommended proceeding with permanent pacemaker implantation and  upped titration of his AV nodal blocking drugs.  Ultimately, if this is  not a possibility, then consideration for AV node ablation would be  another option.  I discussed the procedure with the patient including  the risks, benefits, goals, and expectations.  We plan to proceed with  pacemaker as soon as schedule will allow.      Doylene Canning. Ladona Ridgel, MD  Electronically Signed     GWT/MEDQ  D:  04/10/2006  T:  04/11/2006  Job:  [id number]   cc:   Cecil Cranker, MD, [facility]

## 2010-09-30 NOTE — Discharge Summary (Signed)
[name], DOWNS NO.:  [id number]   MEDICAL RECORD NO.:  [id number]          PATIENT TYPE:  INP   LOCATION:  3703                         FACILITY:  [facility]   PHYSICIAN:  [name] Farber, MD  DATE OF BIRTH:  August 26, 1920   DATE OF ADMISSION:  01/30/2006  DATE OF DISCHARGE:  02/05/2006                                 DISCHARGE SUMMARY   PRIMARY CARDIOLOGIST:  Dr. Glennon Hamilton.   PRINCIPAL DIAGNOSIS:  Right psoas muscle hematoma.   SECONDARY DIAGNOSES:  1. Acute blood loss anemia.  2. Atrial fibrillation with rapid ventricular response, supratherapeutic      INR.  3. Chronic renal insufficiency.  4. Chronic obstructive pulmonary disease on chronic steroids.  5. Coronary artery disease.  6. Hypertension.  7. Hyperlipidemia.  8. Type 2 diabetes mellitus.  9. History of deep vein thrombosis and pulmonary embolus in 2005.  10.History of atrial flutter.  11.History of cataract surgery.   ALLERGIES:  No known drug allergies.   PROCEDURES:  CT of the abdomen and pelvis and 2-D echocardiogram.   HISTORY OF PRESENT ILLNESS:  [name] with prior history of  COPD and atrial flutter on chronic Coumadin therapy who was recently  hospitalized in [place] for COPD exacerbation and atrial fibrillation.  Following his discharge, he began to experience right lower extremity pain  and cramping as well as ecchymosis in the right upper thigh prompting  presenting to the emergency room on January 31, 2006.  While in the ER, he  was noted to be in atrial fibrillation with heart rate in the 140s and was  initiated on diltiazem infusion.  Also notably his INR was supratherapeutic  at 6.2.  He was admitted for additional evaluation.   HOSPITAL COURSE:  From atrial fibrillation standpoint, he converted to sinus  rhythm on the afternoon of January 31, 2006 and his diltiazem was switched  to p.o.  He was also placed on Lopressor which was titrated as  tolerated.  His Coumadin was held.  With regards to the right thigh pain and ecchymosis,  initially he underwent a CT of the right lower extremity which showed no  evidence of acute right hip fracture or thigh hematoma.  Unfortunately,  however, his H&H which was 14.6 an 43.0 respectively on admission drifted  down to a low 8.7 and 25.8 on February 03, 2006.  At that point, we  obtained a CT of the abdomen and pelvis which in fact did reveal and acute  intramuscular hematoma involving the right psoas muscle.  Decision was made  at that point to administer vitamin K and discontinue Coumadin.  He is not  felt to be a strong Coumadin candidate at this point secondary to  spontaneous hematoma.  He did require 1 unit of packed red blood cells on  February 04, 2006 and his H&H has since stabilized at 10.5 and 30.7 today.  He is feeling markedly better.  He has reverted back to atrial fibrillation  as of February 04, 2006 with rates in the 80s to 100 and thus his beta-  blocker has been  titrated further.  He is otherwise asymptomatic and being  discharged home today in satisfactory condition.  We will not be using  Coumadin for the time being.  Instead, I will use aspirin for  anticoagulation.   DISCHARGE LABS:  Hemoglobin 10.5, hematocrit 30.7, WBC 8.7, platelets 151.  PT 14.2, INR 1.1.  Sodium 139, potassium 3.9, chloride 108, CO2 27, BUN 23,  creatinine 1.2, glucose 176, calcium 8.1.  CK 368, MB 4.0, troponin I 1.1.  Total cholesterol 141, triglycerides 171, HDL 52, LDL 55.  TSH 1.168.  Urinalysis was negative.   DISPOSITION:  The patient is being discharged home today in good condition.   FOLLOW-UP PLANS AND APPOINTMENTS:  We will arrange for a follow-up with Dr.  Corinda Gubler in approximately two weeks.   DISCHARGE MEDICATIONS:  1. Lopressor 50 mg b.i.d.  2. Prednisone 10 mg daily.  3. Cardizem CD 360 mg daily.  4. Lantus 18 units q.h.s.  5. Advair 500/50 mg b.i.d.  6. Singulair 10 mg  daily.  7. Lovastatin 20 mg daily.  8. Clonazepam 0.5 mg q.h.s.  9. Albuterol and Atrovent nebulizers q.i.d.  10.Nystatin q.i.d.  11.Nitroglycerin 0.4 mg sublingual p.r.n. chest pain.   OUTSTANDING LAB STUDIES:  None.   Duration discharge encounter 40 minutes including physician time.     ______________________________  [name], [name]      [name] Farber, MD  Electronically Signed    CB/MEDQ  D:  02/05/2006  T:  02/06/2006  Job:  [id number]

## 2010-09-30 NOTE — Op Note (Signed)
[name], PUFF NO.:  [id number]   MEDICAL RECORD NO.:  [id number]          PATIENT TYPE:  INP   LOCATION:  2023                         FACILITY:  [facility]   PHYSICIAN:  Duke Salvia, MD, FACCDATE OF BIRTH:  May 05, 1921   DATE OF PROCEDURE:  04/11/2006  DATE OF DISCHARGE:                               OPERATIVE REPORT   PREOPERATIVE DIAGNOSIS:  Tachy-brady syndrome.   POSTOPERATIVE DIAGNOSIS:  Tachy-brady syndrome.   PROCEDURE:  Implantation of a single chamber pacemaker.   Following obtaining informed consent, the patient was brought to the  electrophysiology laboratory and placed on the fluoroscopic table in the  supine position.  After routine prep and drape to the left upper chest,  lidocaine was infiltrated in the prepectoral subclavicular region.  An  incision was made and carried down to the layer of the prepectoral  fascia using electrocautery and sharp dissection.  A pocket was formed  similarly.  Hemostasis was obtained.   Thereafter, attention was turned to gain access to the __________  thoracic left subclavian vein which was accomplished without difficulty  without the aspiration of air or puncture of the artery.   A guide wire was placed and retained and a 7-French sheath was placed,  through which we passed a Medtronic 4076, 58-cm Active fixation  ventricular lead, serial #[id number].  It was manipulated to the right  ventricular septum where it was identified and clarified in the LAO  projection, as well.  In this location, the bipolar R wave was 7.6 mV  with a pacing threshold of 1.3 volts and 0.5 msec, current threshold was  1.2 mA and the impedance was 815 ohms, current of injury was brisk.  The  lead was secured to the prepectoral fascia and then attached to a  Medtronic Adapta ADSR01 pulse generator, serial #[id number].  The  patient's intrinsic heart rate was 120, left ventricular sensing was  appreciated, but pacing was not.  The  pocket was copiously irrigated  with antibiotic-containing saline solution.  Hemostasis was assured in  the lead and the pulse generator replaced and the pocket secured in the  prepectoral fascia.  The wound was closed in 3 layers in the normal  fashion.  The wound was washed and dried and a benzoin Steri-Strips  dressing was applied.  Needle counts, sponge counts, and instrument  counts were correct at the end of the procedure, according to the staff.  The patient tolerated the procedure without apparent complication.      Duke Salvia, MD, [facility]  Electronically Signed     SCK/MEDQ  D:  04/11/2006  T:  04/11/2006  Job:  [id number]

## 2010-09-30 NOTE — Discharge Summary (Signed)
NAMEMOISHY, LADAY             ACCOUNT NO.:  [id number]   MEDICAL RECORD NO.:  [id number]          PATIENT TYPE:  INP   LOCATION:  2023                         FACILITY[facility]   PHYSICIAN:  Cecil Cranker, MD, FACCDATE OF BIRTH:  Sep 04, 1920   DATE OF ADMISSION:  04/05/2006  DATE OF DISCHARGE:  04/13/2006                               DISCHARGE SUMMARY   ADDENDUM:   PULMONARY MEDICATIONS:  He is to continue:  1. Singulair 10 mg daily.  2. Pulmicort nebulizer twice daily.  3. Prednisone 5 mg daily.  4. He is to continue DuoNeb until they run out.  We are asking him not      to mix them with Pulmicort.   We asked him to make an appointment to see Dr. Maple Hudson in 2 weeks. Because  his heart tends to race with his atrial fibrillation and has required  quite some amount of medications to keep it slowed down, we would  recommend, as we have been using in the hospital, Xopenex. The patient  tells me that he had just gotten a new shipment of DuoNeb, and I said  that, well, he could use them until they run out and to check in with  Dr. Maple Hudson in about 2 weeks to see if there is anything else that he  would recommend. Also, perhaps as he gets his medications through  [facility] or something, which is very expensive, maybe they have  Xopenex.      Maple Mirza, PA      Cecil Cranker, MD, [facility]  Electronically Signed    GM/MEDQ  D:  04/13/2006  T:  04/14/2006  Job:  [id number]   cc:   Joni Fears D. Maple Hudson, MD, FCCP, FACP

## 2010-09-30 NOTE — H&P (Signed)
[place]  Patient:    [name], [name]                    MRN: [id number] Adm. Date:  [id number] Disc. Date: [id number] Attending:  Young, Copy CC:         Trudi Ida. Denton Lank, M.D.  Clinton D. Maple Hudson, M.D.   History and Physical  REFERRING PHYSICIAN:  Trudi Ida. Denton Lank, M.D.  CHIEF COMPLAINT:  Dyspnea.  HISTORY OF PRESENT ILLNESS:  This is a 77 year old white male, never smoked, with a history of chronic asthma.  He has been steroid-dependent, apparently for arthritis and asthma, with baseline dyspnea if he walks at more than a slow, flat pace for more than several blocks or gets in a hurry or goes up hills.  He acutely became worse 2 days ago with a congestive cough that is now turning purulent and came to the emergency room with resting dyspnea and was seen by Dr. Denton Lank.  He was given nebulizers and Solu-Medrol with no benefit and continues to be short of breath at rest and, therefore, I was asked to see him for consideration for admission.  When I examined him, he was short of breath at rest but not diaphoretic.  He denied any chest pain, headache, dysphagia, reflux symptoms.  He did have mild nasal congestion with slightly purulent nasal discharge but no chest pain, fever, or sweats.  He has had mild chills but no definite rigors.  I am admitting him at this time because he continues to have pan expiratory wheezing on exam despite aggressive treatment in the emergency room and has failed to respond to the above measures.  In addition, he is chronically on steroids and is not felt to have ventilatory reserve necessary at this point for outpatient treatment.  PAST MEDICAL HISTORY: 1. Ischemic heart disease status post stent 6/99 with normal LV function    documented. 2. BPH. 3. Obesity with diabetes. 4. Glaucoma. 5. History of sinus surgery for polyps. 6. Diabetes, followed at [facility].  ALLERGIES:  NONSTEROIDALS CAUSE NAUSEA  AND VOMITING, BUT HE IS ABLE TO TOLERATE ASPIRIN.  MEDICATIONS:  He takes prednisone 10 mg daily, aspirin daily, nebulizer usually less than once a week.  SOCIAL HISTORY:  He has never smoked and still lives independently.  FAMILY HISTORY:  Negative for respiratory diseases or asthma.  Positive for cancer in his mother and brother but he does not know the cell type.  REVIEW OF SYSTEMS:  Significant for arthritic complaints for which he is followed by Dr. Phylliss Bob.  Otherwise taken in detail, negative for any significant change in bowel or bladder habits, leg swelling, etc.  PHYSICAL EXAMINATION:  This is an obese white male with moderate increased work of breathing, but he is afebrile, normal vital signs, and no diaphoresis.  HEENT:  Oropharynx is clear with no postnasal drainage or cobblestoning.  NECK:  Supple without cervical adenopathy.  He has no thyromegaly.  Carotid upstrokes are brisk without any bruits.  CHEST:  Normal in contour, slightly hyperresonant percussion with pan expiratory wheezing.  CARDIAC:  There is a regular rate and rhythm without murmur, gallop, or rub present.  ABDOMEN:  Tensely obese with no definite fluid wave.  Hyperresonance to percussion with no tenderness or organomegaly.  EXTREMITIES:  Warm without calf tenderness, cyanosis, clubbing, or edema. Pedal pulses are slightly reduced in a symmetric fashion bilaterally.  NEUROLOGIC:  No focal deficits or pathologic reflexes.  SKIN:  Warm and dry with no lesions.  ANCILLARY DATA:  Chest x-ray reveals hyperinflation with no infiltrates. Hemoglobin saturation was 95% on arrival.  FIO2 not documented.  EKG reveals sinus tachycardia with occasional PVCs; otherwise, unremarkable.  IMPRESSION: 1. Status asthmaticus in patient who is chronically steroid-dependent for    arthritis and asthma with sustained refractory bronchospasm in the    emergency room.  He also has purulent tracheal bronchitis and at  this point    needs to be admitted for IV steroids and around-the-clock nebulizers.  Will    check his sputum for gram stain and culture. 2. Obesity with marked centrifugal distribution, probably largely related to    chronic steroid exposure.  I suspect he has a restrictive component and is    also is set up for reflux.  We will go ahead and institute PPI therapy at    this point empirically.  He will need more dietary input and minimization    of steroids long-term to prevent this from contributing to restrictive    physiology. 3. Diabetes mellitus, Type 2, secondary to obesity and steroids.  We will    place him on a loose sliding scale and continue him on Glucotrol as at    home. DD:  05/13/00 TD:  05/13/00 Job: [id number]

## 2010-09-30 NOTE — H&P (Signed)
NAME:  [name], [name] NO.:  [id number]   MEDICAL RECORD NO.:  [id number]                   PATIENT TYPE:  INP   LOCATION:  2041                                 FACILITY:  [facility]   PHYSICIAN:  Vida Roller, M.D.                DATE OF BIRTH:  May 25, 1920   DATE OF ADMISSION:  12/25/2003  DATE OF DISCHARGE:                                HISTORY & PHYSICAL   CHIEF COMPLAINT:  Pulmonary embolus.   HISTORY OF PRESENT ILLNESS:  This is an 77 year old male who was seen in the  office by Dr. Corinda Gubler and myself yesterday for evaluation of new onset  bilateral lower extremity edema and increased dyspnea.  The patient was felt  to possibly have some mild congestive heart failure.  A BMP, BNP, and D-  dimer were performed.  Today, the D-dimer came back elevated and a CT scan  was ordered on an outpatient basis.  This was positive for pulmonary  embolus.  Arrangements were made for direct admission to [facility]  for anticoagulation therapy.   The patient also reports that he has had four episodes of dizziness in the  past three months, each episode had lasted approximately several hours.  We  had arranged for the patient to have a neurological evaluation by Dr. Dennie Fetters  on an outpatient basis when seen the office the other day.  The patient  denies any recent chest pain or palpitations.   PAST MEDICAL HISTORY:  Significant for coronary artery disease with a  circumflex stent placed in 1999 and another circumflex stent placed in  August 2004.  The patient has had normal ejection fractions.  He has chronic  asthma and COPD on chronic steroid therapy, however, he has never smoked.  He has a history of hypertension, obesity, diabetes mellitus and elevated  lipids.   ALLERGIES:  Non-steroidal anti-inflammatory bugs and insect venom.   MEDICATIONS:  Aspirin 325 mg daily, prednisone 5 mg b.i.d., Cozaar 50 mg  daily, Glipizide XL 5 mg daily, Metformin HCL  500 mg daily, Advair 250/50  b.i.d., Lipitor 20 mg daily, calcium and vitamin D.  The patient was  recently changed to Hyzaar when seen in the office the other day 50/12.5.  He is also on Atrovent and Albuterol handheld nebulizers as well as  Pulmicort handheld nebulizer treatments at home.   SOCIAL HISTORY:  The patient is married.  He lives in [place].  He has  four children.  He has never smoked.  He does not use alcohol.  He worked as  a Curator.  He still stays active around the house.   FAMILY HISTORY:  His father died at age 75,  he was murdered.  His mother  died at age 19 from an unknown cause.  His brother died from cancer.  He has  two sisters who died from congestive heart failure and COPD.   REVIEW OF  SYMPTOMS:  Completely negative except for some hearing loss,  bruising of his skin.  He has dyspnea on exertion.  He has had recent lower  extremity edema.  He has had some wheezing.  He has arthralgias.  He has  heat intolerance.   PHYSICAL EXAMINATION:  GENERAL:  Pleasant 77 year old white male in no acute distress.  VITAL SIGNS:  Blood pressure 169/75, pulse 97, temperature 97.1,  respirations 20.  HEENT:  Unremarkable.  NECK:  No bruits, no jugular venous distention.  HEART:  Regular rate and rhythm without murmur.  LUNGS:  Decreased breath sounds with coarse breath sounds, no wheezing, no  significant rhonchi.  ABDOMEN:  Obese, soft, nontender.  EXTREMITIES:  Pulses to be intact with 1-2+ pitting edema bilaterally.  SKIN:  Warm and dry.  There are some ecchymoses.   LABORATORY DATA:  Pending except for the outpatient CT scan which was  consistent with pulmonary emboli.   IMPRESSION:  1. Pulmonary emboli by CT scan tonight performed at [facility].  2. Coronary artery disease with circumflex stents x 2.  3. Diabetes mellitus.  4. Preserved ejection fraction.  5. Hypertension.  6. Obesity.  7. Steroid dependent asthma.  8. History of elevated  lipids.   PLAN:  The patient will be admitted.  He has been placed on IV heparin.  Coumadin will be started tomorrow.  We will continue his home medications.  Coumadin teaching will be ordered.      Delton See, P.A. [facility]                  Vida Roller, M.D.    DR/MEDQ  D:  12/25/2003  T:  12/25/2003  Job:  [id number]   cc:   Joni Fears D. Young, M.D.  [address] [place]  [place] [address]  Fax: [phone]

## 2010-09-30 NOTE — Assessment & Plan Note (Signed)
[facility]                             PULMONARY OFFICE NOTE   NAME:[name], [name]                    MRN:          [id number]  DATE:06/27/2006                            DOB:          1920/06/18    PROBLEMS:  1. Asthma.  2. Allergic rhinitis.  3. Asthma triad, aspirin sensitivity, nasal polyps.  4. Deep vein thrombosis, pulmonary embolism.  5. Coronary disease, stent.  6. Diabetes.  7. Hypertension.  8. Insect sting hypersensitivity.  9. Atrial fibrillation, pacemaker (Dr. Graciela Husbands).   HISTORY:  He has felt stable recently, says his breathing is comfortable  as long as he stays with his routine meds.  Dr. Graciela Husbands had pulmonary  function tests done.   MEDICATIONS:  1. Prednisone 1/2 of a 10 mg tablet daily.  2. Digoxin 0.125 mg.  3. Clonazepam at bedtime 0.5 mg.  4. Home nebulizer with Xopenex 1.25 mg q.i.d. p.r.n.  5. Nebulizer Pulmicort 0.25 mg b.i.d.  6. Singulair 10 mg.  7. Aspirin 325 mg.  8. Glipizide 5 mg times 1-and-a-half.  9. Metoprolol 50 mg.  10.Amiodarone 200 mg x2.  11.Lasix 40 mg p.r.n. use of nitroglycerin.  12.He is also on potassium 20 mEq.  13.Lovastatin 20 mg.  14.Metformin 500 mg b.i.d.  15.Advair 500/50 used b.i.d.   DRUG INTOLERANCES:  ADVIL.   OBJECTIVE:  Weight 166 pounds, BP 128/60, pulse feels regular at 60 per  minute.  Room air saturation 98%.  I hear no murmur.  He has a mild watery sniffle and trace inspiratory  and expiratory wheeze at mid back bilaterally.  Work of breathing is not  increased.  He is not coughing.  There is no neck vein distension or  stridor and no peripheral edema.   Pulmonary function testing on May 17, 2006 demonstrated mild  obstructive airways disease in small airways where there was slight  response to bronchodilator.  Measured lung volumes were significant for  some increase in residual volume indicating air-trapping, which is  consistent with his obstructive disease.   Diffusion capacity was normal  at 95% of predicted.  Most recent chest x-ray report from [facility]  on April 23, 2006 described left mid and bilateral lower zone  airspace disease, specifically mild interstitial prominence and  indistinctness in the left lung.  That was a portable view.   IMPRESSION:  1. Asthma with little chronic obstructive pulmonary disease.  2. Allergic rhinitis.  He is okay on present therapies.  We need to      watch for evidence of progressive disease consistent with      amiodarone, but currently he is asymptomatic and I think it is      appropriate to be conservative unless that changes.  We agreed on      return for chest x-ray in 6 months, earlier p.r.n.     Clinton D. Maple Hudson, MD, Tonny Bollman, FACP  Electronically Signed    CDY/MedQ  DD: 06/27/2006  DT: 06/27/2006  Job #: [id number]   cc:   Gabriel Earing, M.D.

## 2010-10-31 ENCOUNTER — Encounter: Payer: Self-pay | Admitting: Internal Medicine

## 2010-10-31 ENCOUNTER — Ambulatory Visit (INDEPENDENT_AMBULATORY_CARE_PROVIDER_SITE_OTHER): Payer: Medicare Other | Admitting: *Deleted

## 2010-10-31 DIAGNOSIS — I2699 Other pulmonary embolism without acute cor pulmonale: Secondary | ICD-10-CM

## 2010-10-31 DIAGNOSIS — I4891 Unspecified atrial fibrillation: Secondary | ICD-10-CM

## 2010-10-31 DIAGNOSIS — I82409 Acute embolism and thrombosis of unspecified deep veins of unspecified lower extremity: Secondary | ICD-10-CM

## 2010-11-28 ENCOUNTER — Ambulatory Visit (INDEPENDENT_AMBULATORY_CARE_PROVIDER_SITE_OTHER): Payer: Medicare Other | Admitting: *Deleted

## 2010-11-28 DIAGNOSIS — I2699 Other pulmonary embolism without acute cor pulmonale: Secondary | ICD-10-CM

## 2010-11-28 DIAGNOSIS — I4891 Unspecified atrial fibrillation: Secondary | ICD-10-CM

## 2010-11-28 DIAGNOSIS — I82409 Acute embolism and thrombosis of unspecified deep veins of unspecified lower extremity: Secondary | ICD-10-CM

## 2010-11-28 LAB — POCT INR: INR: 2.8

## 2010-12-01 ENCOUNTER — Encounter: Payer: Self-pay | Admitting: Podiatry

## 2010-12-09 ENCOUNTER — Other Ambulatory Visit: Payer: Self-pay | Admitting: Cardiology

## 2010-12-15 ENCOUNTER — Other Ambulatory Visit: Payer: Self-pay | Admitting: Internal Medicine

## 2010-12-16 ENCOUNTER — Encounter: Payer: Self-pay | Admitting: Internal Medicine

## 2010-12-16 ENCOUNTER — Ambulatory Visit (INDEPENDENT_AMBULATORY_CARE_PROVIDER_SITE_OTHER): Payer: Medicare Other | Admitting: Internal Medicine

## 2010-12-16 DIAGNOSIS — I2510 Atherosclerotic heart disease of native coronary artery without angina pectoris: Secondary | ICD-10-CM

## 2010-12-16 DIAGNOSIS — Z950 Presence of cardiac pacemaker: Secondary | ICD-10-CM

## 2010-12-16 DIAGNOSIS — I10 Essential (primary) hypertension: Secondary | ICD-10-CM

## 2010-12-16 DIAGNOSIS — I495 Sick sinus syndrome: Secondary | ICD-10-CM

## 2010-12-16 DIAGNOSIS — I4891 Unspecified atrial fibrillation: Secondary | ICD-10-CM

## 2010-12-16 LAB — PACEMAKER DEVICE OBSERVATION
AL AMPLITUDE: 4 mv
AL THRESHOLD: 1 V
BAMS-0001: 175 {beats}/min
RV LEAD AMPLITUDE: 15.68 mv
RV LEAD THRESHOLD: 0.75 V
VENTRICULAR PACING PM: 0

## 2010-12-16 NOTE — Assessment & Plan Note (Signed)
The patient's device was interrogated.  The information was reviewed. No changes were made in the programming.    

## 2010-12-16 NOTE — Assessment & Plan Note (Signed)
woth out chest pain

## 2010-12-16 NOTE — Assessment & Plan Note (Signed)
Stable post pacing 

## 2010-12-16 NOTE — Progress Notes (Signed)
  HPI  [name] is a 77 y.o. male  Seen in followup for ischemic heart disease with normal left ventricular function and an occluded circumflex a few years ago . He also has bradycardia paroxysmal atrial fibrillation and previously implanted pacemaker.    He denies SOB or chest pain or edema;  He is getting some exercise   Past Medical History  Diagnosis Date  . Presence of permanent cardiac pacemaker     Medtronic Adapta  . Atrial fibrillation   . HTN (hypertension)   . DM (diabetes mellitus)   . CAD (coronary artery disease)   . Pulmonary embolism   . Deep vein thrombophlebitis of leg   . Nasal polyp   . Allergic rhinitis   . Asthma   . Renal insufficiency     Past Surgical History  Procedure Date  . Cataract/lens implants   . Nasal polyp surgery   . Coronary angioplasty with stent placement     DES  . Pacemaker insertion     Medtronic Adapta    Current Outpatient Prescriptions  Medication Sig Dispense Refill  . amiodarone (PACERONE) 200 MG tablet Take 100 mg by mouth daily.        . budesonide (PULMICORT) 0.25 MG/2ML nebulizer solution Use two times a day       . clonazePAM (KLONOPIN) 0.5 MG tablet Take 1 tablet (0.5 mg total) by mouth at bedtime as needed (sleep).  30 tablet  5  . Doxepin HCl (SILENOR) 6 MG TABS Take by mouth. 1 for sleep if needed       . furosemide (LASIX) 40 MG tablet Take 40 mg by mouth daily.        [name] glipiZIDE (GLUCOTROL) 5 MG tablet Take 1 & 1/2 tablets by mouth once daily       . ipratropium (ATROVENT) 0.02 % nebulizer solution Take 500 mcg by nebulization 4 (four) times daily.        [name] lovastatin (MEVACOR) 40 MG tablet Take 40 mg by mouth at bedtime.        . metoprolol succinate (TOPROL-XL) 25 MG 24 hr tablet TAKE 1 TABLET TWICE A DAY  180 tablet  4  . Multiple Vitamins-Iron (QC DAILY MULTIVITAMINS/IRON) TABS Take 1 tablet by mouth daily.        . nitroGLYCERIN (NITROSTAT) 0.3 MG SL tablet Place 0.3 mg under the tongue every 5  (five) minutes as needed.        . promethazine-codeine (PHENERGAN WITH CODEINE) 6.25-10 MG/5ML syrup 1 tsp four times daily as needed       . warfarin (COUMADIN) 1 MG tablet TAKE 1 TABLET BY MOUTH AS DIRECTED.  45 tablet  3    Allergies  Allergen Reactions  . Asa Buff (Mag (Aspirin Buffered)   . Ibuprofen     REACTION: causes nausea and vomitting    Review of Systems negative except from HPI and PMH  Physical Exam Well developed and well nourished elderly man sitting with a wlalker in no acute distress Neck Supple JVP flat; carotids brisk and full Clear to ausculation Regular rate and rhythm, no murmurs gallops or rub Soft with active bowel sounds No clubbing cyanosis and edema Alert and oriented, grossly normal motor and sensory function Skin Warm and Dry   Assessment and  Plan

## 2010-12-16 NOTE — Patient Instructions (Signed)
Your physician wants you to follow-up in: 6 months with Kristin/ Paula & 1 year with Dr. Klein. You will receive a reminder letter in the mail two months in advance. If you don't receive a letter, please call our office to schedule the follow-up appointment.  Your physician recommends that you continue on your current medications as directed. Please refer to the Current Medication list given to you today.  

## 2010-12-16 NOTE — Assessment & Plan Note (Signed)
Poorly controlled,  Given age though will not adjuxt meds

## 2010-12-30 ENCOUNTER — Encounter: Payer: Medicare Other | Admitting: *Deleted

## 2011-01-06 ENCOUNTER — Ambulatory Visit (INDEPENDENT_AMBULATORY_CARE_PROVIDER_SITE_OTHER): Payer: Medicare Other | Admitting: *Deleted

## 2011-01-06 DIAGNOSIS — I2699 Other pulmonary embolism without acute cor pulmonale: Secondary | ICD-10-CM

## 2011-01-06 DIAGNOSIS — I4891 Unspecified atrial fibrillation: Secondary | ICD-10-CM

## 2011-01-06 DIAGNOSIS — I82409 Acute embolism and thrombosis of unspecified deep veins of unspecified lower extremity: Secondary | ICD-10-CM

## 2011-02-03 ENCOUNTER — Encounter: Payer: Medicare Other | Admitting: *Deleted

## 2011-02-03 ENCOUNTER — Encounter: Payer: Self-pay | Admitting: Internal Medicine

## 2011-02-03 ENCOUNTER — Ambulatory Visit (INDEPENDENT_AMBULATORY_CARE_PROVIDER_SITE_OTHER): Payer: Medicare Other | Admitting: Internal Medicine

## 2011-02-03 VITALS — BP 122/74 | HR 81 | Ht 67.0 in | Wt 168.0 lb

## 2011-02-03 DIAGNOSIS — J309 Allergic rhinitis, unspecified: Secondary | ICD-10-CM

## 2011-02-03 DIAGNOSIS — H6980 Other specified disorders of Eustachian tube, unspecified ear: Secondary | ICD-10-CM

## 2011-02-03 DIAGNOSIS — Z23 Encounter for immunization: Secondary | ICD-10-CM

## 2011-02-03 DIAGNOSIS — J330 Polyp of nasal cavity: Secondary | ICD-10-CM

## 2011-02-03 DIAGNOSIS — J209 Acute bronchitis, unspecified: Secondary | ICD-10-CM

## 2011-02-03 MED ORDER — METHYLPREDNISOLONE ACETATE 80 MG/ML IJ SUSP
80.0000 mg | Freq: Once | INTRAMUSCULAR | Status: AC
  Administered 2011-02-03: 80 mg via INTRAMUSCULAR

## 2011-02-03 MED ORDER — AZITHROMYCIN 250 MG PO TABS: ORAL_TABLET | ORAL | Status: AC

## 2011-02-03 MED ORDER — PHENYLEPHRINE HCL 1 % NA SOLN
3.0000 [drp] | Freq: Once | NASAL | Status: AC
  Administered 2011-02-03: 3 [drp] via NASAL

## 2011-02-03 NOTE — Patient Instructions (Signed)
Neb neo nasal  Depo 80  Script sent for Z pak antibiotic/. This will affect the coumadin, so tell them at the coumadin clinic that you have been taking a Z pak.   Flu vax

## 2011-02-03 NOTE — Progress Notes (Signed)
Subjective:    Patient ID: [name], male    DOB: Jan 02, 1921, 77 y.o.   MRN: [id number]  HPI 02/03/11- 35-year-old male never smoker followed for asthma, rhinitis, complicated by AF/pacemaker,/Coumadin, history PE/DVT, DM, HBP, CAD, renal insufficiency...............[name] wife here Last here- October 21, 2009-note reviewed Chest x-ray 10/02/2009 had shown stable basilar scarring otherwise clear lungs with no evidence of fibrosis from his previous amiodarone. He says he feels well, needing to use his nebulizer machine at least once daily through the spring and now in the fall weather. With recent cool weather his ears feel stopped up, decreased hearing, blowing some yellow from nose and chest, throat feels congested. He does not feel tight or wheezy, or short of breath. Appetite is down. He denies fever, blood, swollen glands were swollen feet.  Review of Systems Constitutional:   No-   weight loss, night sweats, fevers, chills, fatigue, lassitude. HEENT:   No-  headaches, difficulty swallowing, tooth/dental problems, sore throat,       No-  sneezing, itching, ear ache, +nasal congestion, post nasal drip,  CV:  No-   chest pain, orthopnea, PND, swelling in lower extremities, anasarca,  dizziness, palpitations Resp: No-   shortness of breath with exertion or at rest.              + productive cough,  No non-productive cough,  No-  coughing up of blood.              No-   change in color of mucus.  No- wheezing.   Skin: No-   rash or lesions. GI:  No-   heartburn, indigestion, abdominal pain, nausea, vomiting, diarrhea,                 change in bowel habits, loss of appetite GU: No-   dysuria, change in color of urine, no urgency or frequency.  No- flank pain. MS:  No-   joint pain or swelling.  No- decreased range of motion.  No- back pain. Neuro- grossly normal to observation, Or:  Psych:  No- change in mood or affect. No depression or anxiety.  No memory loss.      Objective:   Physical  Exam General- Alert, Oriented, Affect-appropriate, Distress- none acute Skin- rash-none, lesions- none, excoriation- none Lymphadenopathy- none Head- atraumatic            Eyes- Gross vision intact, PERRLA, conjunctivae clear secretions,             Ears- Hearing, canals-looks clear. Hard of hearing-depends on his wife.            Nose- Clear, no-Septal dev, mucus, polyps, erosion, perforation             Throat- Mallampati II , mucosa clear , drainage- none, tonsils- atrophic Neck- flexible , trachea midline, no stridor , thyroid nl, carotid no bruit Chest - symmetrical excursion , unlabored           Heart/CV- RRR , no murmur , no gallop  , no rub, nl s1 s2                           - JVD- none , edema- none, stasis changes- none, varices- none           Lung- clear to P&A but distant, wheeze- none, cough- none , dullness-none, rub- none, unlabored           Chest wall-  Abd- tender-no, distended-no, bowel sounds-present, HSM- no Br/ Gen/ Rectal- Not done, not indicated Extrem- cyanosis- none, clubbing, none, atrophy- none, strength- nl Neuro- grossly intact to observation         Assessment & Plan:

## 2011-02-07 NOTE — Assessment & Plan Note (Signed)
Upper respiratory infection with bronchitis and eustachian dysfunction Plan nasal inhalation, Depo-Medrol, Z-Pak Flu vaccine

## 2011-02-07 NOTE — Assessment & Plan Note (Signed)
None seen on examination 02/03/2011. Recurrence at this age unlikely.

## 2011-02-07 NOTE — Assessment & Plan Note (Signed)
At his age and from his description, I think an infection, probably viral, is more likely than seasonal allergy for causing his acute symptoms.

## 2011-02-10 ENCOUNTER — Ambulatory Visit (INDEPENDENT_AMBULATORY_CARE_PROVIDER_SITE_OTHER): Payer: Medicare Other | Admitting: *Deleted

## 2011-02-10 DIAGNOSIS — I2699 Other pulmonary embolism without acute cor pulmonale: Secondary | ICD-10-CM

## 2011-02-10 DIAGNOSIS — I4891 Unspecified atrial fibrillation: Secondary | ICD-10-CM

## 2011-02-10 DIAGNOSIS — I82409 Acute embolism and thrombosis of unspecified deep veins of unspecified lower extremity: Secondary | ICD-10-CM

## 2011-02-10 LAB — POCT INR: INR: 2.4

## 2011-02-14 ENCOUNTER — Other Ambulatory Visit: Payer: Self-pay | Admitting: Cardiology

## 2011-02-28 ENCOUNTER — Other Ambulatory Visit: Payer: Self-pay | Admitting: Cardiology

## 2011-03-10 ENCOUNTER — Ambulatory Visit (INDEPENDENT_AMBULATORY_CARE_PROVIDER_SITE_OTHER): Payer: Medicare Other | Admitting: *Deleted

## 2011-03-10 DIAGNOSIS — I2699 Other pulmonary embolism without acute cor pulmonale: Secondary | ICD-10-CM

## 2011-03-10 DIAGNOSIS — I4891 Unspecified atrial fibrillation: Secondary | ICD-10-CM

## 2011-03-10 DIAGNOSIS — I82409 Acute embolism and thrombosis of unspecified deep veins of unspecified lower extremity: Secondary | ICD-10-CM

## 2011-03-29 ENCOUNTER — Telehealth: Payer: Self-pay | Admitting: Internal Medicine

## 2011-03-29 NOTE — Telephone Encounter (Signed)
These are ok to refill. Advise her to watch out he doesn't get over sedated from too much medicine.

## 2011-03-29 NOTE — Telephone Encounter (Signed)
Called and spoke with pt's wife.  Wife is requesting a refill on pt's clonazepam and phenergan with codeine for his cough.  Clonazepam last filled 08/29/10 # 30 x 5 refills. phenergan with codeine last filled 09/21/10 for # 200 ml x 3 refill.  Wife aware Cy out of office until tomorrow and stated she was ok to wait until tomorrow to have message address.  Cy, please advise.  Thanks. Allergies  Allergen Reactions  . Asa Buff (Mag (Buffered Aspirin)   . Ibuprofen     REACTION: causes nausea and vomitting

## 2011-03-30 MED ORDER — PROMETHAZINE-CODEINE 6.25-10 MG/5ML PO SYRP: ORAL_SOLUTION | ORAL | Status: DC

## 2011-03-30 MED ORDER — CLONAZEPAM 0.5 MG PO TABS: 0.5000 mg | ORAL_TABLET | Freq: Every evening | ORAL | Status: DC | PRN

## 2011-03-30 NOTE — Telephone Encounter (Signed)
I spoke with pt wife and she is aware rx's was called into [facility]. Wife is also aware to watch out for pt so he doesn't get over sedated from these medications. Pt wife states she will.

## 2011-03-31 ENCOUNTER — Encounter: Payer: Medicare Other | Admitting: *Deleted

## 2011-04-04 ENCOUNTER — Ambulatory Visit (INDEPENDENT_AMBULATORY_CARE_PROVIDER_SITE_OTHER): Payer: Medicare Other | Admitting: *Deleted

## 2011-04-04 DIAGNOSIS — I2699 Other pulmonary embolism without acute cor pulmonale: Secondary | ICD-10-CM

## 2011-04-04 DIAGNOSIS — I4891 Unspecified atrial fibrillation: Secondary | ICD-10-CM

## 2011-04-04 DIAGNOSIS — I82409 Acute embolism and thrombosis of unspecified deep veins of unspecified lower extremity: Secondary | ICD-10-CM

## 2011-04-04 LAB — POCT INR: INR: 3.2

## 2011-04-07 ENCOUNTER — Ambulatory Visit (INDEPENDENT_AMBULATORY_CARE_PROVIDER_SITE_OTHER): Payer: Medicare Other | Admitting: Internal Medicine

## 2011-04-07 ENCOUNTER — Encounter: Payer: Self-pay | Admitting: Internal Medicine

## 2011-04-07 VITALS — BP 132/72 | HR 84 | Ht 67.0 in | Wt 171.0 lb

## 2011-04-07 DIAGNOSIS — J45909 Unspecified asthma, uncomplicated: Secondary | ICD-10-CM

## 2011-04-07 DIAGNOSIS — G4700 Insomnia, unspecified: Secondary | ICD-10-CM

## 2011-04-07 DIAGNOSIS — J309 Allergic rhinitis, unspecified: Secondary | ICD-10-CM

## 2011-04-07 MED ORDER — LORAZEPAM 0.5 MG PO TABS: ORAL_TABLET | ORAL | Status: DC

## 2011-04-07 NOTE — Patient Instructions (Signed)
Try lorazepam for sleep-   1 or 2 a little before bedtime if needed  Stop clonazepam and Silenor  Watch out for being confused or over sedated with sleep medications.   If you aren't sleepy, try finding something else to do and wait an hour or so,  to go to sleep later when your brain is ready.

## 2011-04-07 NOTE — Progress Notes (Signed)
Patient ID: [name], male    DOB: 08/21/1920, 77 y.o.   MRN: [id number]  HPI 02/03/11- 51-year-old male never smoker followed for asthma, rhinitis, complicated by AF/pacemaker,/Coumadin, history PE/DVT, DM, HBP, CAD, renal insufficiency...............[name] wife here Last here- October 21, 2009-note reviewed Chest x-ray 10/02/2009 had shown stable basilar scarring otherwise clear lungs with no evidence of fibrosis from his previous amiodarone. He says he feels well, needing to use his nebulizer machine at least once daily through the spring and now in the fall weather. With recent cool weather his ears feel stopped up, decreased hearing, blowing some yellow from nose and chest, throat feels congested. He does not feel tight or wheezy, or short of breath. Appetite is down. He denies fever, blood, swollen glands were swollen feet.  04/07/11-  77 year old male never smoker followed for asthma, rhinitis, complicated by AF/pacemaker,/Coumadin, history PE/DVT, DM, HBP, CAD, renal insufficiency...............[name] wife here He has had flu shot. He says his breathing and allergy problems are "fine", under good control. He complains of his chronic insomnia. He is going to bed at 9:00 but rarely sleepy before 11. His wife gets him up at 6:45 AM. He tried Silenor but it caused headache. Tried cough syrup for sedation but that did not help. Says clonazepam 0.5 mg at bedtime does not help his sleep initiation. This is his chronic pattern, without observation of movement or respiratory disturbance. He does not feel physically uncomfortable in bed.   Review of Systems Constitutional:   No-   weight loss, night sweats, fevers, chills, fatigue, lassitude. HEENT:   No-  headaches, difficulty swallowing, tooth/dental problems, sore throat,       No-  sneezing, itching, ear ache, +nasal congestion, post nasal drip,  CV:  No-   chest pain, orthopnea, PND, swelling in lower extremities, anasarca,  dizziness,  palpitations Resp: No-   shortness of breath with exertion or at rest.              Little change in mild productive cough,  No non-productive cough,  No-  coughing up of blood.              No-   change in color of mucus.  No- wheezing.   Skin: No-   rash or lesions. GI:  No-   heartburn, indigestion, abdominal pain, nausea, vomiting, diarrhea,                 change in bowel habits, loss of appetite GU: No-   dysuria, change in color of urine, no urgency or frequency.  No- flank pain. MS:  No-   joint pain or swelling.  No- decreased range of motion.  No- back pain. Neuro- grossly normal to observation, Or:  Psych:  No- change in mood or affect. No depression or anxiety.  No memory loss.      Objective:   Physical Exam General- Alert, Oriented, Affect-appropriate, Distress- none acute Skin- rash-none, lesions- none, excoriation- none Lymphadenopathy- none Head- atraumatic            Eyes- Gross vision intact, PERRLA, conjunctivae  Watery clear secretions,             Ears- Hearing -Hard of hearing-depends on his wife.            Nose- Clear, no-Septal dev, mucus, polyps, erosion, perforation             Throat- Mallampati II , mucosa clear , drainage- none, tonsils- atrophic Neck- flexible ,  trachea midline, no stridor , thyroid nl, carotid no bruit Chest - symmetrical excursion , unlabored           Heart/CV- RRR , no murmur , no gallop  , no rub, nl s1 s2                           - JVD- none , edema- none, stasis changes- none, varices- none           Lung- clear to P&A but distant, wheeze- none, cough- none , dullness-none, rub- none, unlabored           Chest wall-  Abd- tender-no, distended-no, bowel sounds-present, HSM- no Br/ Gen/ Rectal- Not done, not indicated Extrem- cyanosis- none, clubbing, none, atrophy- none, strength- nl Neuro- grossly intact to observation

## 2011-04-07 NOTE — Assessment & Plan Note (Signed)
His chronic insomnia becomes more difficult to work with safely because of his age. I spent extra time today discussing this with him and his wife. She denies that he gets confused or disoriented. I discussed sleep hygiene, comfortable sleep, distracting activities to try instead of lying in bed getting frustrated. We are going to try changing clonazepam, which has not helped him, to a somewhat shorter acting lorazepam with appropriate safety discussion.

## 2011-04-07 NOTE — Assessment & Plan Note (Signed)
He is comfortable, control is good, no changes needed.

## 2011-04-07 NOTE — Assessment & Plan Note (Signed)
Good control currently with no medication questions or concerns.

## 2011-04-10 ENCOUNTER — Telehealth: Payer: Self-pay | Admitting: Internal Medicine

## 2011-04-11 NOTE — Telephone Encounter (Signed)
Per Florentina Addison, she has not seen any audir form from [facility]. I spoke with Lupita Leash and she will refax the form to our triage fax. Will deliver this to [facility] once received.

## 2011-04-11 NOTE — Telephone Encounter (Signed)
Fax received and handed to [place].

## 2011-04-11 NOTE — Telephone Encounter (Signed)
Placed on CY's cart to advise if okay to send information and what to send.

## 2011-04-12 NOTE — Telephone Encounter (Signed)
Information approved by CY to send and has been faxed as requested.

## 2011-04-25 ENCOUNTER — Other Ambulatory Visit: Payer: Self-pay | Admitting: Internal Medicine

## 2011-04-25 ENCOUNTER — Encounter: Payer: Medicare Other | Admitting: *Deleted

## 2011-05-01 ENCOUNTER — Telehealth: Payer: Self-pay | Admitting: Internal Medicine

## 2011-05-01 MED ORDER — AZITHROMYCIN 250 MG PO TABS: ORAL_TABLET | ORAL | Status: AC

## 2011-05-01 NOTE — Telephone Encounter (Signed)
Per CY-okay to give Zpak #1 take as directed no refills and set up OV with CY or TP as schedule allows. I spoke with wife of patient-aware of RX sent and appt with TP on Wed 05-03-2011 at 1045am-pt will be here at 1030am.

## 2011-05-01 NOTE — Telephone Encounter (Signed)
Spouse states pt c/o cough w/ yellow phlem, wheezing, chest congestion, unknown fever but feels warm, runny nose, PND, nasal congestion, head feels "funny" x 2-3 days getting worse. Pt using nebs 3-4 times a day. Spouse requesting an apt for pt to be seen tomorrow. Please advise Dr. Maple Hudson. Thanks  Allergies  Allergen Reactions  . Asa Buff (Mag (Buffered Aspirin)   . Ibuprofen     REACTION: causes nausea and vomitting

## 2011-05-03 ENCOUNTER — Encounter: Payer: Self-pay | Admitting: Adult Health

## 2011-05-03 ENCOUNTER — Ambulatory Visit (INDEPENDENT_AMBULATORY_CARE_PROVIDER_SITE_OTHER): Payer: Medicare Other | Admitting: *Deleted

## 2011-05-03 ENCOUNTER — Ambulatory Visit (INDEPENDENT_AMBULATORY_CARE_PROVIDER_SITE_OTHER): Payer: Medicare Other | Admitting: Adult Health

## 2011-05-03 VITALS — BP 130/72 | HR 70 | Temp 96.7°F | Ht 68.0 in | Wt 170.2 lb

## 2011-05-03 DIAGNOSIS — I4891 Unspecified atrial fibrillation: Secondary | ICD-10-CM

## 2011-05-03 DIAGNOSIS — I82409 Acute embolism and thrombosis of unspecified deep veins of unspecified lower extremity: Secondary | ICD-10-CM

## 2011-05-03 DIAGNOSIS — I2699 Other pulmonary embolism without acute cor pulmonale: Secondary | ICD-10-CM

## 2011-05-03 DIAGNOSIS — J209 Acute bronchitis, unspecified: Secondary | ICD-10-CM

## 2011-05-03 LAB — POCT INR: INR: 2.6

## 2011-05-03 MED ORDER — PREDNISONE 10 MG PO TABS: ORAL_TABLET | ORAL | Status: DC

## 2011-05-03 NOTE — Progress Notes (Signed)
Patient ID: [name], male    DOB: 29-Mar-1921, 77 y.o.   MRN: [id number]  HPI 02/03/11- 15-year-old male never smoker followed for asthma, rhinitis, complicated by AF/pacemaker,/Coumadin, history PE/DVT, DM, HBP, CAD, renal insufficiency...............[name] wife here Last here- October 21, 2009-note reviewed Chest x-ray 10/02/2009 had shown stable basilar scarring otherwise clear lungs with no evidence of fibrosis from his previous amiodarone. He says he feels well, needing to use his nebulizer machine at least once daily through the spring and now in the fall weather. With recent cool weather his ears feel stopped up, decreased hearing, blowing some yellow from nose and chest, throat feels congested. He does not feel tight or wheezy, or short of breath. Appetite is down. He denies fever, blood, swollen glands were swollen feet.  04/07/11-  77 year old male never smoker followed for asthma, rhinitis, complicated by AF/pacemaker,/Coumadin, history PE/DVT, DM, HBP, CAD, renal insufficiency...............[name] wife here He has had flu shot. He says his breathing and allergy problems are "fine", under good control. He complains of his chronic insomnia. He is going to bed at 9:00 but rarely sleepy before 11. His wife gets him up at 6:45 AM. He tried Silenor but it caused headache. Tried cough syrup for sedation but that did not help. Says clonazepam 0.5 mg at bedtime does not help his sleep initiation. This is his chronic pattern, without observation of movement or respiratory disturbance. He does not feel physically uncomfortable in bed.  05/03/2011 Acute OV  Complains of  wheezing,sob,productive cough(yellow and thick). Called in Zpack 2 days ago.  No fever or body aches. Coughing is getting worse. Wheezing is worse in early am.  No hemoptysis or chest pain . No edema . Appetite is good.  Feels some better since starting Zpack.      Review of Systems Constitutional:   No  weight loss, night sweats,   Fevers, chills,  +fatigue, or  lassitude.  HEENT:   No headaches,  Difficulty swallowing,  Tooth/dental problems, or  Sore throat,                No sneezing, itching, ear ache,  +nasal congestion, post nasal drip,   CV:  No chest pain,  Orthopnea, PND, swelling in lower extremities, anasarca, dizziness, palpitations, syncope.   GI  No heartburn, indigestion, abdominal pain, nausea, vomiting, diarrhea, change in bowel habits, loss of appetite, bloody stools.   Resp: ,  No coughing up of blood.  [name]  No chest wall deformity  Skin: no rash or lesions.  GU: no dysuria, change in color of urine, no urgency or frequency.  No flank pain, no hematuria   MS:  No joint pain or swelling.  No decreased range of motion.  No back pain.  Psych:  No change in mood or affect. No depression or anxiety.  No memory loss.          Objective:   Physical Exam GEN: A/Ox3; pleasant , NAD, elderly   HEENT:  [place]/AT,  EACs-clear, TMs-wnl, NOSE-clear drainage, THROAT-clear, no lesions, no postnasal drip or exudate noted.   NECK:  Supple w/ fair ROM; no JVD; normal carotid impulses w/o bruits; no thyromegaly or nodules palpated; no lymphadenopathy.  RESP  Coarse BS w/ few exp wheezes no accessory muscle use, no dullness to percussion  CARD:  RRR, no m/r/g  , no peripheral edema, pulses intact, no cyanosis or clubbing.  GI:   Soft & nt; nml bowel sounds; no organomegaly or masses detected.  Musco: Warm bil, no deformities or joint swelling noted.   Neuro: alert, no focal deficits noted.    Skin: Warm, no lesions or rashes

## 2011-05-03 NOTE — Patient Instructions (Signed)
Finish Zpack.  Mucinex DM .Twice daily  As needed  Cough/congestion  Prednisone taper over next week.  Please contact office for sooner follow up if symptoms do not improve or worsen or seek emergency care  follow up Dr. Maple Hudson  As planned.

## 2011-05-03 NOTE — Assessment & Plan Note (Signed)
Slow to resolve flare   Plan:  Finish Zpack.  Mucinex DM .Twice daily  As needed  Cough/congestion  Prednisone taper over next week.  Please contact office for sooner follow up if symptoms do not improve or worsen or seek emergency care  follow up Dr. Maple Hudson  As planned.

## 2011-05-04 ENCOUNTER — Other Ambulatory Visit: Payer: Self-pay | Admitting: Internal Medicine

## 2011-05-18 ENCOUNTER — Telehealth: Payer: Self-pay | Admitting: Internal Medicine

## 2011-05-18 NOTE — Telephone Encounter (Signed)
I spoke with cynthia from [facility] and she states pt is currently under medicare audit and will need a copy of his medication list prior to 03/2011. I advised will send medication list over to [phone]. Nothing further was needed

## 2011-05-31 ENCOUNTER — Ambulatory Visit (INDEPENDENT_AMBULATORY_CARE_PROVIDER_SITE_OTHER): Payer: Medicare Other | Admitting: *Deleted

## 2011-05-31 DIAGNOSIS — I82409 Acute embolism and thrombosis of unspecified deep veins of unspecified lower extremity: Secondary | ICD-10-CM

## 2011-05-31 DIAGNOSIS — I4891 Unspecified atrial fibrillation: Secondary | ICD-10-CM

## 2011-05-31 DIAGNOSIS — I2699 Other pulmonary embolism without acute cor pulmonale: Secondary | ICD-10-CM

## 2011-05-31 LAB — POCT INR: INR: 3.1

## 2011-06-28 ENCOUNTER — Encounter: Payer: Medicare Other | Admitting: *Deleted

## 2011-07-05 ENCOUNTER — Ambulatory Visit (INDEPENDENT_AMBULATORY_CARE_PROVIDER_SITE_OTHER): Payer: Medicare Other | Admitting: *Deleted

## 2011-07-05 DIAGNOSIS — I2699 Other pulmonary embolism without acute cor pulmonale: Secondary | ICD-10-CM

## 2011-07-05 DIAGNOSIS — I4891 Unspecified atrial fibrillation: Secondary | ICD-10-CM

## 2011-07-05 DIAGNOSIS — I82409 Acute embolism and thrombosis of unspecified deep veins of unspecified lower extremity: Secondary | ICD-10-CM

## 2011-07-10 ENCOUNTER — Telehealth: Payer: Self-pay | Admitting: Internal Medicine

## 2011-07-10 NOTE — Telephone Encounter (Signed)
Christine faxing over form for Dr. Maple Hudson to sign for [facility] Audit on this patient. Asked that they fax this to triage fax. Will hold msg in triage until fax arrives.

## 2011-07-11 NOTE — Telephone Encounter (Signed)
Paperwork that was sent needed CY's signature; I have gotten this and faxed back to [facility].

## 2011-07-11 NOTE — Telephone Encounter (Signed)
[name] did someone give you these forms yet- nothing is on the triage fax, please advise and if not we can call and request these again, thanks

## 2011-08-02 ENCOUNTER — Telehealth: Payer: Self-pay | Admitting: Internal Medicine

## 2011-08-02 NOTE — Telephone Encounter (Signed)
Pt"s pharm has changed to [facility] and he needs all new Rx for his medications sent in because [facility] has no refill info and and they are trying to get his meds refilled

## 2011-08-04 MED ORDER — FUROSEMIDE 40 MG PO TABS: 40.0000 mg | ORAL_TABLET | Freq: Every day | ORAL | Status: DC

## 2011-08-04 MED ORDER — LOVASTATIN 40 MG PO TABS: 40.0000 mg | ORAL_TABLET | Freq: Every day | ORAL | Status: DC

## 2011-08-04 MED ORDER — METOPROLOL SUCCINATE ER 25 MG PO TB24: 25.0000 mg | ORAL_TABLET | Freq: Every day | ORAL | Status: DC

## 2011-08-04 MED ORDER — AMIODARONE HCL 200 MG PO TABS: ORAL_TABLET | ORAL | Status: DC

## 2011-08-07 ENCOUNTER — Telehealth: Payer: Self-pay | Admitting: Internal Medicine

## 2011-08-07 NOTE — Telephone Encounter (Signed)
Called and spoke with pt and his spouse. Pt c/o cough- prod with minimal yellow sputum. He states that the cough has "never really left" since last visit with CDY. He states that when he takes abx it calms down some, but then he is right back where he started not long after completing. Spouse states "he rattles". Pt denies any wheeze, CP, SOB, fever. Pt would like appt with CDY this wk. I advised no openings until May. Pt was seen by TP last visit 05/03/11. Please advise, thanks! Allergies  Allergen Reactions  . Asa Buff (Mag (Buffered Aspirin)   . Ibuprofen     REACTION: causes nausea and vomitting

## 2011-08-08 NOTE — Telephone Encounter (Signed)
Per CY-okay to get pt in for appt as able. I have put patient on schedule with CY Thursday at 1130am; aware to be here at 1115am to check in. I spoke with Scarlette Calico regarding this matter.

## 2011-08-10 ENCOUNTER — Ambulatory Visit (INDEPENDENT_AMBULATORY_CARE_PROVIDER_SITE_OTHER): Payer: Medicare Other | Admitting: Internal Medicine

## 2011-08-10 ENCOUNTER — Encounter: Payer: Self-pay | Admitting: Internal Medicine

## 2011-08-10 VITALS — BP 118/64 | HR 80 | Ht 68.0 in | Wt 161.4 lb

## 2011-08-10 DIAGNOSIS — J45909 Unspecified asthma, uncomplicated: Secondary | ICD-10-CM

## 2011-08-10 MED ORDER — FLUTICASONE-SALMETEROL 100-50 MCG/DOSE IN AEPB: 1.0000 | INHALATION_SPRAY | Freq: Two times a day (BID) | RESPIRATORY_TRACT | Status: DC

## 2011-08-10 NOTE — Patient Instructions (Signed)
Add sample Advair 100     1 puff then rinse mouth well, twice daily     If it seems to help then get the prescription filled.   Please call as needed

## 2011-08-10 NOTE — Progress Notes (Signed)
Patient ID: [name], male    DOB: 1920/11/12, 78 y.o.   MRN: [id number]  HPI 02/03/11- 5-year-old male never smoker followed for asthma, rhinitis, complicated by AF/pacemaker,/Coumadin, history PE/DVT, DM, HBP, CAD, renal insufficiency...............[name] wife here Last here- October 21, 2009-note reviewed Chest x-ray 10/02/2009 had shown stable basilar scarring otherwise clear lungs with no evidence of fibrosis from his previous amiodarone. He says he feels well, needing to use his nebulizer machine at least once daily through the spring and now in the fall weather. With recent cool weather his ears feel stopped up, decreased hearing, blowing some yellow from nose and chest, throat feels congested. He does not feel tight or wheezy, or short of breath. Appetite is down. He denies fever, blood, swollen glands were swollen feet.  04/07/11-  78 year old male never smoker followed for asthma, rhinitis, complicated by AF/pacemaker,/Coumadin, history PE/DVT, DM, HBP, CAD, renal insufficiency...............[name] wife here He has had flu shot. He says his breathing and allergy problems are "fine", under good control. He complains of his chronic insomnia. He is going to bed at 9:00 but rarely sleepy before 11. His wife gets him up at 6:45 AM. He tried Silenor but it caused headache. Tried cough syrup for sedation but that did not help. Says clonazepam 0.5 mg at bedtime does not help his sleep initiation. This is his chronic pattern, without observation of movement or respiratory disturbance. He does not feel physically uncomfortable in bed.  05/03/2011 Acute OV  Complains of  wheezing,sob,productive cough(yellow and thick). Called in Zpack 2 days ago.  No fever or body aches. Coughing is getting worse. Wheezing is worse in early am.  No hemoptysis or chest pain . No edema . Appetite is good.  Feels some better since starting Zpack.   08/10/11-  78 year old male never smoker followed for asthma, rhinitis,  complicated by AF/pacemaker,/Coumadin, history PE/DVT, DM, HBP, CAD, renal insufficiency...............[name] wife here Chest congestion,cough-productive-yellow in color for weeks now-starts and stops; slight wheeze. They say the pattern is not new or changed. Chronic variable productive cough may improve for a few days after antibitotics. No blood and not progressive. Using only a nebulizer machine- dropped off of inhalers. Herat rhythm well controlled on chronic amiodarone w/ pacemaker.   ROS-see HPI Constitutional:   No-   weight loss, night sweats, fevers, chills, fatigue, lassitude. HEENT:   No-  headaches, difficulty swallowing, tooth/dental problems, sore throat,       No-  sneezing, itching, ear ache, nasal congestion, +post nasal drip,  CV:  No-   chest pain, orthopnea, PND, swelling in lower extremities, anasarca,dizziness, palpitations Resp: Chronic  shortness of breath with exertion or at rest.              + productive cough,  =o non-productive cough,  No- coughing up of blood.              +  change in color of mucus.  No- wheezing.   Skin: No-   rash or lesions. GI:  No-   heartburn, indigestion, abdominal pain, nausea, vomiting,  GU: MS:  No-   joint pain or swelling.   Neuro-     nothing unusual Psych:  No- change in mood or affect. No depression or anxiety.  No memory loss.  OBJ- Physical Exam General- Alert, Oriented, Affect-appropriate, Distress- none acute; elderly w/ walker Skin- rash-none, lesions- none, excoriation- none Lymphadenopathy- none Head- atraumatic            Eyes- Gross  vision intact, PERRLA, conjunctivae and secretions clear            Ears- Hearing diminished            Nose- Clear, no-Septal dev, mucus, polyps, erosion, perforation             Throat- Mallampati II , mucosa clear , drainage- none, tonsils- atrophic Neck- flexible , trachea midline, no stridor , thyroid nl, carotid no bruit Chest - symmetrical excursion , unlabored           Heart/CV-  RRR , no murmur , no gallop  , no rub, nl s1 s2                           - JVD- none , edema- none, stasis changes- none, varices- none           Lung- Unlabored coarse wheeze , dullness-none, rub- none           Chest wall-  Abd-  Br/ Gen/ Rectal- Not done, not indicated Extrem- cyanosis- none, clubbing, none, atrophy- none, strength- nl Neuro- grossly intact to observation

## 2011-08-16 ENCOUNTER — Ambulatory Visit (INDEPENDENT_AMBULATORY_CARE_PROVIDER_SITE_OTHER): Payer: Medicare Other | Admitting: *Deleted

## 2011-08-16 ENCOUNTER — Telehealth: Payer: Self-pay | Admitting: Internal Medicine

## 2011-08-16 DIAGNOSIS — I4891 Unspecified atrial fibrillation: Secondary | ICD-10-CM

## 2011-08-16 DIAGNOSIS — I82409 Acute embolism and thrombosis of unspecified deep veins of unspecified lower extremity: Secondary | ICD-10-CM

## 2011-08-16 DIAGNOSIS — I2699 Other pulmonary embolism without acute cor pulmonale: Secondary | ICD-10-CM

## 2011-08-16 LAB — POCT INR: INR: 3.5

## 2011-08-16 MED ORDER — PROMETHAZINE-CODEINE 6.25-10 MG/5ML PO SYRP: ORAL_SOLUTION | ORAL | Status: DC

## 2011-08-16 NOTE — Telephone Encounter (Signed)
I have not seen any contact from drug store. Ok to refill his cough syrup and refill x 3

## 2011-08-16 NOTE — Telephone Encounter (Signed)
Spoke with pt's spouse. Pt last seen 3/28 by CDY and his cough is no better since then, no worse. She states that he is needing refill on phenergan with codeine syrup b/c he takes this every night. Please advise if okay to send rx and if okay to give any additional refills. Thanks!' Allergies  Allergen Reactions  . Asa Buff (Mag (Buffered Aspirin)   . Ibuprofen     REACTION: causes nausea and vomitting

## 2011-08-16 NOTE — Telephone Encounter (Signed)
I have called rx into the pharmacy. I spoke with spouse and is aware of this. Nothing further was needed

## 2011-09-07 ENCOUNTER — Other Ambulatory Visit: Payer: Self-pay | Admitting: *Deleted

## 2011-09-07 MED ORDER — FUROSEMIDE 40 MG PO TABS: 40.0000 mg | ORAL_TABLET | Freq: Every day | ORAL | Status: DC

## 2011-09-13 ENCOUNTER — Ambulatory Visit (INDEPENDENT_AMBULATORY_CARE_PROVIDER_SITE_OTHER): Payer: Medicare Other | Admitting: Pharmacist

## 2011-09-13 DIAGNOSIS — I82409 Acute embolism and thrombosis of unspecified deep veins of unspecified lower extremity: Secondary | ICD-10-CM

## 2011-09-13 DIAGNOSIS — I2699 Other pulmonary embolism without acute cor pulmonale: Secondary | ICD-10-CM

## 2011-09-13 DIAGNOSIS — I4891 Unspecified atrial fibrillation: Secondary | ICD-10-CM

## 2011-09-19 ENCOUNTER — Other Ambulatory Visit: Payer: Self-pay | Admitting: Internal Medicine

## 2011-09-20 ENCOUNTER — Telehealth: Payer: Self-pay | Admitting: *Deleted

## 2011-09-20 NOTE — Telephone Encounter (Signed)
Received refill request from the pharmacy regarding pt phenergan with codeine cough syrup. This was last given to pt 08/16/11 #200 ML x 3 refills. I called the pharmacy to see if pt had used all his refills and was advised he has not and disregard the refill request. Will sign off message

## 2011-09-29 ENCOUNTER — Telehealth: Payer: Self-pay | Admitting: Internal Medicine

## 2011-09-29 MED ORDER — PROMETHAZINE-CODEINE 6.25-10 MG/5ML PO SYRP: ORAL_SOLUTION | ORAL | Status: DC

## 2011-09-29 NOTE — Telephone Encounter (Signed)
Pts wife aware that I have called refill to pharmacy.

## 2011-09-29 NOTE — Telephone Encounter (Signed)
Per Cy ok to fill promethazine codeine cough syrup

## 2011-09-29 NOTE — Telephone Encounter (Signed)
CY please advise if okay to refill as requested. Pt is scheduled to follow up with you on 11-2011. Thanks.

## 2011-10-06 ENCOUNTER — Ambulatory Visit: Payer: Medicare Other | Admitting: Internal Medicine

## 2011-10-06 ENCOUNTER — Telehealth: Payer: Self-pay | Admitting: Internal Medicine

## 2011-10-06 MED ORDER — CLONAZEPAM 0.5 MG PO TABS: 0.5000 mg | ORAL_TABLET | Freq: Every evening | ORAL | Status: DC | PRN

## 2011-10-06 NOTE — Telephone Encounter (Signed)
Last ov with CY was 08/10/2011---CY please advise if ok to send in refills of the clonazepam.  Thanks  Allergies  Allergen Reactions  . Asa Buff (Mag (Buffered Aspirin)   . Ibuprofen     REACTION: causes nausea and vomitting

## 2011-10-06 NOTE — Telephone Encounter (Signed)
Ok to refill clonazepam x 5 months ref

## 2011-10-06 NOTE — Telephone Encounter (Signed)
Rx refill was sent to pharm. Spoke with pt's spouse and notified of recs. Pt aware and states nothing further needed.

## 2011-10-11 ENCOUNTER — Ambulatory Visit (INDEPENDENT_AMBULATORY_CARE_PROVIDER_SITE_OTHER): Payer: Medicare Other | Admitting: Internal Medicine

## 2011-10-11 ENCOUNTER — Encounter: Payer: Self-pay | Admitting: Internal Medicine

## 2011-10-11 ENCOUNTER — Ambulatory Visit (INDEPENDENT_AMBULATORY_CARE_PROVIDER_SITE_OTHER)
Admission: RE | Admit: 2011-10-11 | Discharge: 2011-10-11 | Disposition: A | Discharge status: Home / Self Care | Payer: Medicare Other | Source: Ambulatory Visit | Attending: Pulmonary Disease | Admitting: Pulmonary Disease

## 2011-10-11 VITALS — BP 120/82 | HR 79 | Ht 68.0 in | Wt 171.6 lb

## 2011-10-11 DIAGNOSIS — J45909 Unspecified asthma, uncomplicated: Secondary | ICD-10-CM

## 2011-10-11 DIAGNOSIS — J449 Chronic obstructive pulmonary disease, unspecified: Secondary | ICD-10-CM

## 2011-10-11 MED ORDER — PREDNISONE 10 MG PO TABS: ORAL_TABLET | ORAL | Status: DC

## 2011-10-11 MED ORDER — BENZONATATE 200 MG PO CAPS: 200.0000 mg | ORAL_CAPSULE | Freq: Three times a day (TID) | ORAL | Status: AC | PRN

## 2011-10-11 NOTE — Patient Instructions (Addendum)
Order- CXR   Dx chronic bronchitis  Stop Advair  Sample Dulera 100      2 puffs, then rinse mouth well, twice every day  Script sent for benzonatate for cough to use as needed  Script sent for prednisone tabs

## 2011-10-11 NOTE — Progress Notes (Signed)
Patient ID: [name], male    DOB: 08/29/1920, 78 y.o.   MRN: [id number]  HPI 02/03/11- 72-year-old male never smoker followed for asthma, rhinitis, complicated by AF/pacemaker,/Coumadin, history PE/DVT, DM, HBP, CAD, renal insufficiency...............[name] wife here Last here- October 21, 2009-note reviewed Chest x-ray 10/02/2009 had shown stable basilar scarring otherwise clear lungs with no evidence of fibrosis from his previous amiodarone. He says he feels well, needing to use his nebulizer machine at least once daily through the spring and now in the fall weather. With recent cool weather his ears feel stopped up, decreased hearing, blowing some yellow from nose and chest, throat feels congested. He does not feel tight or wheezy, or short of breath. Appetite is down. He denies fever, blood, swollen glands were swollen feet.  04/07/11-  78 year old male never smoker followed for asthma, rhinitis, complicated by AF/pacemaker,/Coumadin, history PE/DVT, DM, HBP, CAD, renal insufficiency...............[name] wife here He has had flu shot. He says his breathing and allergy problems are "fine", under good control. He complains of his chronic insomnia. He is going to bed at 9:00 but rarely sleepy before 11. His wife gets him up at 6:45 AM. He tried Silenor but it caused headache. Tried cough syrup for sedation but that did not help. Says clonazepam 0.5 mg at bedtime does not help his sleep initiation. This is his chronic pattern, without observation of movement or respiratory disturbance. He does not feel physically uncomfortable in bed.  05/03/2011 Acute OV  Complains of  wheezing,sob,productive cough(yellow and thick). Called in Zpack 2 days ago.  No fever or body aches. Coughing is getting worse. Wheezing is worse in early am.  No hemoptysis or chest pain . No edema . Appetite is good.  Feels some better since starting Zpack.   08/10/11-  78 year old male never smoker followed for asthma, rhinitis,  complicated by AF/pacemaker,/Coumadin, history PE/DVT, DM, HBP, CAD, renal insufficiency...............[name] wife here Chest congestion,cough-productive-yellow in color for weeks now-starts and stops; slight wheeze. They say the pattern is not new or changed. Chronic variable productive cough may improve for a few days after antibitotics. No blood and not progressive. Using only a nebulizer machine- dropped off of inhalers. Herat rhythm well controlled on chronic amiodarone w/ pacemaker.   10/11/11- 78 year old male never smoker followed for asthma, rhinitis, complicated by AF/pacemaker,/Coumadin, history PE/DVT, DM, HBP, CAD, renal insufficiency...............[name] wife here She says he has not changed much in the last 2 months. Productive cough with yellow and white "bubbles" but no blood or chest pain. Denies fever or swelling. Nebulizer helps a little with Atrovent used 4 times daily. Advair did not help. He is off of chronic prednisone.  ROS-see HPI Constitutional:   No-   weight loss, night sweats, fevers, chills, fatigue, lassitude. HEENT:   No-  headaches, difficulty swallowing, tooth/dental problems, sore throat,       No-  sneezing, itching, ear ache, nasal congestion, +post nasal drip,  CV:  No-   chest pain, orthopnea, PND, swelling in lower extremities, anasarca,dizziness, palpitations Resp: Chronic shortness of breath with exertion or at rest.              + productive cough, No- non-productive cough,  No- coughing up of blood.              +  change in color of mucus.  No- wheezing.   Skin: No-   rash or lesions. GI:  No-   heartburn, indigestion, abdominal pain, nausea, vomiting,  GU: MS:  No-   joint pain or swelling.   Neuro-     nothing unusual Psych:  No- change in mood or affect. No depression or anxiety.  No memory loss.  OBJ- Physical Exam General- Alert, Oriented, Affect-appropriate, Distress- none acute;  Quite elderly w/ walker Skin- rash-none, lesions- none, excoriation-  none Lymphadenopathy- none Head- atraumatic            Eyes- Gross vision intact, PERRLA, conjunctivae and secretions clear            Ears- Hearing diminished            Nose- Clear, no-Septal dev, mucus, polyps, erosion, perforation             Throat- Mallampati II , mucosa clear , drainage- none, tonsils- atrophic Neck- flexible , trachea midline, no stridor , thyroid nl, carotid no bruit Chest - symmetrical excursion , unlabored           Heart/CV- RRR , no murmur , no gallop  , no rub, nl s1 s2                           - JVD- none , edema- none, stasis changes- none, varices- none           Lung- +bilateral wheeze , dullness-none, rub- none           Chest wall-  Abd-  Br/ Gen/ Rectal- Not done, not indicated Extrem- cyanosis- none, clubbing, none, atrophy- none, strength- nl Neuro- grossly intact to observation

## 2011-10-15 NOTE — Assessment & Plan Note (Signed)
Luella Cook with education done. Prednisone 10 mg, one or 2 daily to stabilize him through an upcoming family reunion at his wife's request. Steroid discussion done. Benzonatate. Chest x-ray.

## 2011-10-17 NOTE — Progress Notes (Signed)
Quick Note:  Pt aware of results via wife. ______ 

## 2011-10-25 ENCOUNTER — Ambulatory Visit (INDEPENDENT_AMBULATORY_CARE_PROVIDER_SITE_OTHER): Payer: Medicare Other | Admitting: *Deleted

## 2011-10-25 DIAGNOSIS — I82409 Acute embolism and thrombosis of unspecified deep veins of unspecified lower extremity: Secondary | ICD-10-CM

## 2011-10-25 DIAGNOSIS — I2699 Other pulmonary embolism without acute cor pulmonale: Secondary | ICD-10-CM

## 2011-10-25 DIAGNOSIS — I4891 Unspecified atrial fibrillation: Secondary | ICD-10-CM

## 2011-10-25 LAB — POCT INR: INR: 3.1

## 2011-11-03 ENCOUNTER — Telehealth: Payer: Self-pay | Admitting: Internal Medicine

## 2011-11-03 NOTE — Telephone Encounter (Signed)
CY please advise if ok to refill the lorazepam for this pt.  Last rx given 03/2011 with 5 refills.  Last ov was 10/11/2011.  Please advise.  Thanks  Allergies  Allergen Reactions  . Asa Buff (Mag (Buffered Aspirin)   . Ibuprofen     REACTION: causes nausea and vomitting

## 2011-11-06 MED ORDER — LORAZEPAM 0.5 MG PO TABS: ORAL_TABLET | ORAL | Status: DC

## 2011-11-06 NOTE — Telephone Encounter (Signed)
Ok to refill. Caution against over sedation.

## 2011-11-06 NOTE — Telephone Encounter (Signed)
RX has been sent.

## 2011-11-07 ENCOUNTER — Telehealth: Payer: Self-pay | Admitting: Internal Medicine

## 2011-11-07 MED ORDER — PROMETHAZINE-CODEINE 6.25-10 MG/5ML PO SYRP: ORAL_SOLUTION | ORAL | Status: DC

## 2011-11-07 NOTE — Telephone Encounter (Signed)
[facility] [address] REQUESTING RX   PROMETH/CODEINE SYR <> TAKE 5 ML BY MOUTH 4 TIMES A DAY AS NEEDED FOR COUGH . 200 ML Allergies  Allergen Reactions  . Asa Buff (Mag (Buffered Aspirin)   . Ibuprofen     REACTION: causes nausea and vomitting   DR Maple Hudson is this ok to fill Thank you

## 2011-11-07 NOTE — Telephone Encounter (Signed)
Per CY-okay to refill as requested. 

## 2011-12-01 ENCOUNTER — Ambulatory Visit (INDEPENDENT_AMBULATORY_CARE_PROVIDER_SITE_OTHER): Payer: Medicare Other | Admitting: *Deleted

## 2011-12-01 DIAGNOSIS — I82409 Acute embolism and thrombosis of unspecified deep veins of unspecified lower extremity: Secondary | ICD-10-CM

## 2011-12-01 DIAGNOSIS — I4891 Unspecified atrial fibrillation: Secondary | ICD-10-CM

## 2011-12-01 DIAGNOSIS — I2699 Other pulmonary embolism without acute cor pulmonale: Secondary | ICD-10-CM

## 2011-12-11 ENCOUNTER — Ambulatory Visit (INDEPENDENT_AMBULATORY_CARE_PROVIDER_SITE_OTHER): Payer: Medicare Other | Admitting: Internal Medicine

## 2011-12-11 ENCOUNTER — Encounter: Payer: Self-pay | Admitting: Internal Medicine

## 2011-12-11 VITALS — BP 120/66 | HR 101 | Ht 68.0 in | Wt 167.6 lb

## 2011-12-11 DIAGNOSIS — M459 Ankylosing spondylitis of unspecified sites in spine: Secondary | ICD-10-CM

## 2011-12-11 DIAGNOSIS — J45909 Unspecified asthma, uncomplicated: Secondary | ICD-10-CM

## 2011-12-11 MED ORDER — FLUTICASONE-SALMETEROL 250-50 MCG/DOSE IN AEPB: INHALATION_SPRAY | RESPIRATORY_TRACT | Status: DC

## 2011-12-11 MED ORDER — PROMETHAZINE-CODEINE 6.25-10 MG/5ML PO SYRP: ORAL_SOLUTION | ORAL | Status: DC

## 2011-12-11 NOTE — Patient Instructions (Addendum)
Sample and script for Advair 250   1 puff, then rinse mouth, twice daily  Script to refill cough syrup  Order- [facility]- primary care referral   Dx ankylosing spondylitis

## 2011-12-11 NOTE — Progress Notes (Signed)
Patient ID: [name], male    DOB: 1921-01-07, 78 y.o.   MRN: [id number]  HPI 02/03/11- 76-year-old male never smoker followed for asthma, rhinitis, complicated by AF/pacemaker,/Coumadin, history PE/DVT, DM, HBP, CAD, renal insufficiency...............[name] wife here Last here- October 21, 2009-note reviewed Chest x-ray 10/02/2009 had shown stable basilar scarring otherwise clear lungs with no evidence of fibrosis from his previous amiodarone. He says he feels well, needing to use his nebulizer machine at least once daily through the spring and now in the fall weather. With recent cool weather his ears feel stopped up, decreased hearing, blowing some yellow from nose and chest, throat feels congested. He does not feel tight or wheezy, or short of breath. Appetite is down. He denies fever, blood, swollen glands were swollen feet.  04/07/11-  78 year old male never smoker followed for asthma, rhinitis, complicated by AF/pacemaker,/Coumadin, history PE/DVT, DM, HBP, CAD, renal insufficiency...............[name] wife here He has had flu shot. He says his breathing and allergy problems are "fine", under good control. He complains of his chronic insomnia. He is going to bed at 9:00 but rarely sleepy before 11. His wife gets him up at 6:45 AM. He tried Silenor but it caused headache. Tried cough syrup for sedation but that did not help. Says clonazepam 0.5 mg at bedtime does not help his sleep initiation. This is his chronic pattern, without observation of movement or respiratory disturbance. He does not feel physically uncomfortable in bed.  05/03/2011 Acute OV  Complains of  wheezing,sob,productive cough(yellow and thick). Called in Zpack 2 days ago.  No fever or body aches. Coughing is getting worse. Wheezing is worse in early am.  No hemoptysis or chest pain . No edema . Appetite is good.  Feels some better since starting Zpack.   08/10/11-  78 year old male never smoker followed for asthma, rhinitis,  complicated by AF/pacemaker,/Coumadin, history PE/DVT, DM, HBP, CAD, renal insufficiency...............[name] wife here Chest congestion,cough-productive-yellow in color for weeks now-starts and stops; slight wheeze. They say the pattern is not new or changed. Chronic variable productive cough may improve for a few days after antibitotics. No blood and not progressive. Using only a nebulizer machine- dropped off of inhalers. Herat rhythm well controlled on chronic amiodarone w/ pacemaker.   10/11/11- 78 year old male never smoker followed for asthma, rhinitis, complicated by AF/pacemaker,/Coumadin, history PE/DVT, DM, HBP, CAD, renal insufficiency...............[name] wife here She says he has not changed much in the last 2 months. Productive cough with yellow and white "bubbles" but no blood or chest pain. Denies fever or swelling. Nebulizer helps a little with Atrovent used 4 times daily. Advair did not help. He is off of chronic prednisone.  12/11/11 -78 year old male never smoker followed for asthma, rhinitis, complicated by AF/pacemaker,/Coumadin, history PE/DVT, DM, HBP, CAD, renal insufficiency...............[name] wife here Wife thinks his breathing is doing well. Very limited activity due to leg weakness. Gives out quickly with breathing and not sleeping (not due to lung condition) Has no primary care since doctor moved. Dropped off prednisone which did help, but dropped off Advair which did not help. Uses nebulizer 3 times per day.  CXR 6/4 /13 IMPRESSION: Mild chronic changes at the lung bases. Ankylosing  spondylitis.  Original Report Authenticated By: Gwynn Burly, M.D.   ROS-see HPI Constitutional:   No-   weight loss, night sweats, fevers, chills, fatigue, lassitude. HEENT:   No-  headaches, difficulty swallowing, tooth/dental problems, sore throat,       No-  sneezing, itching, ear ache, nasal congestion, +  post nasal drip,  CV:  No-   chest pain, orthopnea, PND, swelling in lower extremities,  anasarca,dizziness, palpitations Resp:  +Chronic shortness of breath with exertion or at rest.              Little productive cough, No- non-productive cough,  No- coughing up of blood.              No- change in color of mucus.  No- wheezing.   Skin: No-   rash or lesions. GI:  No-   heartburn, indigestion, abdominal pain, nausea, vomiting,  GU: MS:  No-   joint pain or swelling.   Neuro-     nothing unusual Psych:  No- change in mood or affect. No depression or anxiety.  No memory loss.  OBJ- Physical Exam General- Alert, Oriented, Affect-appropriate, Distress- none acute;  Quite elderly, w/ walker Skin- rash-none, lesions- none, excoriation- none Lymphadenopathy- none Head- atraumatic            Eyes- Gross vision intact, PERRLA, conjunctivae and secretions clear            Ears- Hearing diminished            Nose- Clear, no-Septal dev, mucus, polyps, erosion, perforation             Throat- Mallampati II , mucosa clear , drainage- none, tonsils- atrophic Neck- flexible , trachea midline, no stridor , thyroid nl, carotid no bruit Chest - symmetrical excursion , unlabored           Heart/CV- RRR with skips , no murmur , no gallop  , no rub, nl s1 s2                           - JVD- none , edema- none, stasis changes- none, varices- none           Lung- +bilateral wheeze , dullness-none, rub- none           Chest wall-  Abd-  Br/ Gen/ Rectal- Not done, not indicated Extrem- cyanosis- none, clubbing, none, atrophy- none, strength- nl Neuro- grossly intact to observation

## 2011-12-17 DIAGNOSIS — M459 Ankylosing spondylitis of unspecified sites in spine: Secondary | ICD-10-CM | POA: Insufficient documentation

## 2011-12-17 NOTE — Assessment & Plan Note (Signed)
Try stronger Advair 250/50 with instruction. Continue nebulizer machine

## 2011-12-17 NOTE — Assessment & Plan Note (Signed)
Noted on chest x ray  

## 2011-12-22 ENCOUNTER — Telehealth: Payer: Self-pay | Admitting: Internal Medicine

## 2011-12-22 NOTE — Telephone Encounter (Signed)
Called and spoke with pt and he stated that CY started the pt on advair and he did not know if he should stay on the advair and the nebulizer meds.  The insert also told him to be careful with other meds while taking advair and he wanted to know if he is taking any other meds that he should be concerned about this.  CY please advise. Thanks  Allergies  Allergen Reactions  . Asa Buff (Mag (Buffered Aspirin)   . Ibuprofen     REACTION: causes nausea and vomitting

## 2011-12-22 NOTE — Telephone Encounter (Signed)
His nebulizer meds are atrovent and budesonide (pulmicort). These will not interact with the Advair, so he can continue as prescribed. I don't seen any other meds that would be a problem.

## 2011-12-22 NOTE — Telephone Encounter (Signed)
Called spoke with patient, advised of CY's recs regarding pt's nebs and advair.  Pt will continue to take his medications as prescribed.  Nothing further needed; will sign off.

## 2012-01-09 ENCOUNTER — Ambulatory Visit (INDEPENDENT_AMBULATORY_CARE_PROVIDER_SITE_OTHER): Payer: Medicare Other

## 2012-01-09 DIAGNOSIS — I82409 Acute embolism and thrombosis of unspecified deep veins of unspecified lower extremity: Secondary | ICD-10-CM

## 2012-01-09 DIAGNOSIS — I4891 Unspecified atrial fibrillation: Secondary | ICD-10-CM

## 2012-01-09 DIAGNOSIS — I2699 Other pulmonary embolism without acute cor pulmonale: Secondary | ICD-10-CM

## 2012-01-09 LAB — POCT INR: INR: 1.4

## 2012-01-12 ENCOUNTER — Encounter: Payer: Medicare Other | Admitting: Internal Medicine

## 2012-01-16 ENCOUNTER — Ambulatory Visit (INDEPENDENT_AMBULATORY_CARE_PROVIDER_SITE_OTHER): Payer: Medicare Other | Admitting: Pharmacist

## 2012-01-16 ENCOUNTER — Encounter: Payer: Self-pay | Admitting: Internal Medicine

## 2012-01-16 ENCOUNTER — Ambulatory Visit (INDEPENDENT_AMBULATORY_CARE_PROVIDER_SITE_OTHER): Payer: Medicare Other | Admitting: Internal Medicine

## 2012-01-16 VITALS — BP 125/67 | HR 64 | Ht 67.5 in | Wt 173.0 lb

## 2012-01-16 DIAGNOSIS — R05 Cough: Secondary | ICD-10-CM

## 2012-01-16 DIAGNOSIS — Z950 Presence of cardiac pacemaker: Secondary | ICD-10-CM

## 2012-01-16 DIAGNOSIS — I2699 Other pulmonary embolism without acute cor pulmonale: Secondary | ICD-10-CM

## 2012-01-16 DIAGNOSIS — I82409 Acute embolism and thrombosis of unspecified deep veins of unspecified lower extremity: Secondary | ICD-10-CM

## 2012-01-16 DIAGNOSIS — I495 Sick sinus syndrome: Secondary | ICD-10-CM

## 2012-01-16 DIAGNOSIS — I4891 Unspecified atrial fibrillation: Secondary | ICD-10-CM

## 2012-01-16 LAB — PACEMAKER DEVICE OBSERVATION
ATRIAL PACING PM: 71
BAMS-0001: 175 {beats}/min
BATTERY VOLTAGE: 2.76 V
RV LEAD AMPLITUDE: 16 mv
VENTRICULAR PACING PM: 0

## 2012-01-16 NOTE — Assessment & Plan Note (Signed)
Recurrent episode of atrial fibrillation that lasted a couple of months. He does not note any symptoms over that period of time. Rate control during that episode with relatively rapid with a mean over 100 beats per minute. We will need to keep a close eye on this.  Ejection fraction was 60% he may well need up titration of his beta blocker or the addition of a calcium blocker for rate control

## 2012-01-16 NOTE — Assessment & Plan Note (Signed)
The patient's device was interrogated.  The information was reviewed. No changes were made in the programming.    

## 2012-01-16 NOTE — Patient Instructions (Addendum)
Your physician wants you to follow-up in: 6 months in device clinic and 12 months with Dr Graciela Husbands.  You will receive a reminder letter in the mail two months in advance. If you don't receive a letter, please call our office to schedule the follow-up appointment.  Your physician has recommended you make the following change in your medication: Stop Amiodarone

## 2012-01-16 NOTE — Progress Notes (Signed)
HPI  [name] is a 78 y.o. male  Seen in followup for ischemic heart disease with normal left ventricular function and an occluded circumflex a few years ago . He also has bradycardia paroxysmal atrial fibrillation and previously implanted pacemaker.    He denies SOB or chest pain or edema;  He is getting some exercise  He has had a cough for the last month. He sees Dr. Maple Hudson. He is known COPD and asthma  Past Medical History  Diagnosis Date  . Presence of permanent cardiac pacemaker     Medtronic Adapta  . Atrial fibrillation   . HTN (hypertension)   . DM (diabetes mellitus)   . CAD (coronary artery disease)   . Pulmonary embolism   . Deep vein thrombophlebitis of leg   . Nasal polyp   . Allergic rhinitis   . Asthma   . Renal insufficiency     Past Surgical History  Procedure Date  . Cataract/lens implants   . Nasal polyp surgery   . Coronary angioplasty with stent placement     DES  . Pacemaker insertion     Medtronic Adapta    Current Outpatient Prescriptions  Medication Sig Dispense Refill  . amiodarone (PACERONE) 200 MG tablet TAKE ONE-HALF (1/2) TABLET EVERY MORNING  90 tablet  3  . budesonide (PULMICORT) 0.25 MG/2ML nebulizer solution Use two times a day       . clonazePAM (KLONOPIN) 0.5 MG tablet Take 1 tablet (0.5 mg total) by mouth at bedtime as needed (sleep).  30 tablet  5  . Fluticasone-Salmeterol (ADVAIR DISKUS) 250-50 MCG/DOSE AEPB 1 puff then rinse moth, twice daily  60 each  5  . furosemide (LASIX) 40 MG tablet Take 1 tablet (40 mg total) by mouth daily.  90 tablet  4  . glipiZIDE (GLUCOTROL) 5 MG tablet Take 1 & 1/2 tablets by mouth once daily       . ipratropium (ATROVENT) 0.02 % nebulizer solution Take 500 mcg by nebulization 4 (four) times daily.        [name] LORazepam (ATIVAN) 0.5 MG tablet 1 or 2 at bedtime as needed for sleep  60 tablet  5  . lovastatin (MEVACOR) 40 MG tablet Take 1 tablet (40 mg total) by mouth at bedtime.  90 tablet  2  .  metoprolol succinate (TOPROL-XL) 25 MG 24 hr tablet Take 1 tablet (25 mg total) by mouth daily.  180 tablet  4  . Multiple Vitamins-Iron (QC DAILY MULTIVITAMINS/IRON) TABS Take 1 tablet by mouth daily.        . nitroGLYCERIN (NITROSTAT) 0.3 MG SL tablet Place 0.3 mg under the tongue every 5 (five) minutes as needed.        . predniSONE (DELTASONE) 10 MG tablet 1 or 2 daily as needed  40 tablet  0  . promethazine-codeine (PHENERGAN WITH CODEINE) 6.25-10 MG/5ML syrup 1 tsp four times daily as needed  200 mL  1  . warfarin (COUMADIN) 1 MG tablet TAKE 1 TABLET BY MOUTH AS DIRECTED.  45 tablet  3    Allergies  Allergen Reactions  . Asa Buff (Mag (Buffered Aspirin)   . Ibuprofen     REACTION: causes nausea and vomitting    Review of Systems negative except from HPI and PMH  Physical Exam BP 125/67  Pulse 64  Ht 5' 7.5" (1.715 m)  Wt 173 lb (78.472 kg)  BMI 26.70 kg/m2  Well developed and well nourished elderly man sitting with a  wlalker in no acute distress Neck Supple JVP flat; carotids brisk and full Wheezes Regular rate and rhythm, no murmurs gallops or rub Soft with active bowel sounds No clubbing cyanosis and edema Alert and oriented, grossly normal motor and sensory function flexor strength is 4+ out of 5 Skin Warm and Dry   Assessment and  Plan

## 2012-01-16 NOTE — Assessment & Plan Note (Signed)
Atrially paced 

## 2012-01-16 NOTE — Assessment & Plan Note (Signed)
His cough may be related to his COPD but has been going on for some months. 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 Maple Hudson later this month and we can get his input as well.  The discontinuation of his amiodarone may have an impact on his anticoagulation and will notify the Coumadin clinic

## 2012-01-19 ENCOUNTER — Other Ambulatory Visit: Payer: Self-pay | Admitting: Cardiology

## 2012-01-19 DIAGNOSIS — I739 Peripheral vascular disease, unspecified: Secondary | ICD-10-CM

## 2012-01-23 ENCOUNTER — Other Ambulatory Visit: Payer: Self-pay | Admitting: Cardiology

## 2012-01-23 DIAGNOSIS — R52 Pain, unspecified: Secondary | ICD-10-CM

## 2012-01-23 DIAGNOSIS — R609 Edema, unspecified: Secondary | ICD-10-CM

## 2012-01-23 DIAGNOSIS — J810 Acute pulmonary edema: Secondary | ICD-10-CM

## 2012-01-24 ENCOUNTER — Encounter (INDEPENDENT_AMBULATORY_CARE_PROVIDER_SITE_OTHER): Payer: Medicare Other

## 2012-01-24 ENCOUNTER — Ambulatory Visit (INDEPENDENT_AMBULATORY_CARE_PROVIDER_SITE_OTHER): Payer: Medicare Other | Admitting: *Deleted

## 2012-01-24 DIAGNOSIS — I4891 Unspecified atrial fibrillation: Secondary | ICD-10-CM

## 2012-01-24 DIAGNOSIS — R52 Pain, unspecified: Secondary | ICD-10-CM

## 2012-01-24 DIAGNOSIS — Z86718 Personal history of other venous thrombosis and embolism: Secondary | ICD-10-CM

## 2012-01-24 DIAGNOSIS — I82409 Acute embolism and thrombosis of unspecified deep veins of unspecified lower extremity: Secondary | ICD-10-CM

## 2012-01-24 DIAGNOSIS — I2699 Other pulmonary embolism without acute cor pulmonale: Secondary | ICD-10-CM

## 2012-01-24 DIAGNOSIS — R609 Edema, unspecified: Secondary | ICD-10-CM

## 2012-01-24 DIAGNOSIS — M79609 Pain in unspecified limb: Secondary | ICD-10-CM

## 2012-01-25 ENCOUNTER — Encounter (INDEPENDENT_AMBULATORY_CARE_PROVIDER_SITE_OTHER): Payer: Medicare Other

## 2012-01-25 DIAGNOSIS — I739 Peripheral vascular disease, unspecified: Secondary | ICD-10-CM

## 2012-01-25 DIAGNOSIS — E1159 Type 2 diabetes mellitus with other circulatory complications: Secondary | ICD-10-CM

## 2012-01-31 ENCOUNTER — Ambulatory Visit (INDEPENDENT_AMBULATORY_CARE_PROVIDER_SITE_OTHER): Payer: Medicare Other | Admitting: Cardiovascular Disease

## 2012-01-31 ENCOUNTER — Encounter: Payer: Self-pay | Admitting: Cardiovascular Disease

## 2012-01-31 VITALS — BP 153/81 | HR 77 | Ht 67.0 in | Wt 173.0 lb

## 2012-01-31 DIAGNOSIS — I739 Peripheral vascular disease, unspecified: Secondary | ICD-10-CM

## 2012-01-31 NOTE — Patient Instructions (Addendum)
Exercise on your stationary bike 4 times a week. Do for 10-15 minutes then gradually increase to 25-30 minutes.  Follow up as needed.

## 2012-01-31 NOTE — Assessment & Plan Note (Signed)
The patient does have evidence of peripheral arterial disease. However, I don't think it's severe enough to explain his symptoms of lower extremity weakness and inability to walk. The weakness in the lower extremities started about 3 years ago after a prolonged hospitalization. He appears to have significant muscular atrophy which I suspect was due to prolonged hospitalization and inactivity. His femoral pulses are reasonable and thus it's unlikely that he has significant iliac disease. Due to his age and functional capacity, I don't think he would benefit from proceeding with angiography. He is not having symptoms of claudication and has no symptoms suggestive of critical limb ischemia. I advised him to start an exercise program to strengthen the lower extremity muscles. He can followup as needed.

## 2012-01-31 NOTE — Progress Notes (Signed)
HPI  This is an 78 year old man who is here today for evaluation of peripheral arterial disease. He follows up with Dr. Graciela Husbands for ischemic heart disease with normal left ventricular function and an occluded circumflex  . He also has bradycardia paroxysmal atrial fibrillation and previously implanted pacemaker.  He has been seeing Dr. Leeanne Deed for ingrown nails but has no wounds or ulcers. He had lower extremity arterial duplex ultrasound done which showed normal ABI on the left side and mildly reduced on the right side with possible right iliac stenosis. There was discrete left SFA and popliteal stenosis and thus, the ABI was felt to be falsely high. The patient is accompanied by his wife. They reported his main complaint is having no strength in his lower extremities with difficulty walking. He has to use a walker. He has been like this for at least 3 years and this happened after a prolonged hospitalization for about 20 days. Since then, he lost all muscle strength in the lower extremities. He denies any lower extremity discomfort.  Allergies  Allergen Reactions  . Asa Buff (Mag (Buffered Aspirin)   . Ibuprofen     REACTION: causes nausea and vomitting     Current Outpatient Prescriptions on File Prior to Visit  Medication Sig Dispense Refill  . budesonide (PULMICORT) 0.25 MG/2ML nebulizer solution Use two times a day       . clonazePAM (KLONOPIN) 0.5 MG tablet Take 1 tablet (0.5 mg total) by mouth at bedtime as needed (sleep).  30 tablet  5  . Fluticasone-Salmeterol (ADVAIR DISKUS) 250-50 MCG/DOSE AEPB 1 puff then rinse moth, twice daily  60 each  5  . furosemide (LASIX) 40 MG tablet Take 1 tablet (40 mg total) by mouth daily.  90 tablet  4  . glipiZIDE (GLUCOTROL) 5 MG tablet Take 1 & 1/2 tablets by mouth once daily       . ipratropium (ATROVENT) 0.02 % nebulizer solution Take 500 mcg by nebulization 4 (four) times daily.        [name] LORazepam (ATIVAN) 0.5 MG tablet 1 or 2 at bedtime as  needed for sleep  60 tablet  5  . lovastatin (MEVACOR) 40 MG tablet Take 1 tablet (40 mg total) by mouth at bedtime.  90 tablet  2  . metoprolol succinate (TOPROL-XL) 25 MG 24 hr tablet Take 1 tablet (25 mg total) by mouth daily.  180 tablet  4  . Multiple Vitamins-Iron (QC DAILY MULTIVITAMINS/IRON) TABS Take 1 tablet by mouth daily.        . nitroGLYCERIN (NITROSTAT) 0.3 MG SL tablet Place 0.3 mg under the tongue every 5 (five) minutes as needed.        . predniSONE (DELTASONE) 10 MG tablet 1 or 2 daily as needed  40 tablet  0  . promethazine-codeine (PHENERGAN WITH CODEINE) 6.25-10 MG/5ML syrup 1 tsp four times daily as needed  200 mL  1  . warfarin (COUMADIN) 1 MG tablet TAKE 1 TABLET BY MOUTH AS DIRECTED.  45 tablet  3     Past Medical History  Diagnosis Date  . Presence of permanent cardiac pacemaker     Medtronic Adapta  . Atrial fibrillation   . HTN (hypertension)   . DM (diabetes mellitus)   . CAD (coronary artery disease)   . Pulmonary embolism   . Deep vein thrombophlebitis of leg   . Nasal polyp   . Allergic rhinitis   . Asthma   . Renal insufficiency  Past Surgical History  Procedure Date  . Cataract/lens implants   . Nasal polyp surgery   . Coronary angioplasty with stent placement     DES  . Pacemaker insertion     Medtronic Adapta     Family History  Problem Relation Age of Onset  . Diabetes Neg Hx   . Hypertension Neg Hx   . Coronary artery disease Neg Hx      History   Social History  . Marital Status: Married    Spouse Name: N/A    Number of Children: N/A  . Years of Education: N/A   Occupational History  . Former Occupational hygienist    Social History Main Topics  . Smoking status: Never Smoker   . Smokeless tobacco: Not on file  . Alcohol Use: Not on file  . Drug Use: Not on file  . Sexually Active: Not on file   Other Topics Concern  . Not on file   Social History Narrative  . No narrative on file     PHYSICAL EXAM   BP 153/81   Pulse 77  Ht 5\' 7"  (1.702 m)  Wt 173 lb (78.472 kg)  BMI 27.10 kg/m2  Constitutional: He is oriented to person, place, and time. He appears well-developed and well-nourished. No distress.  HENT: No nasal discharge.  Head: Normocephalic and atraumatic.  Eyes: Pupils are equal and round. Right eye exhibits no discharge. Left eye exhibits no discharge.  Neck: Normal range of motion. Neck supple. No JVD present. No thyromegaly present.  Cardiovascular: Normal rate, regular rhythm, normal heart sounds and. Exam reveals no gallop and no friction rub. No murmur heard.  Pulmonary/Chest: Effort normal and breath sounds normal. No stridor. No respiratory distress. He has no wheezes. He has no rales. He exhibits no tenderness.  Abdominal: Soft. Bowel sounds are normal. He exhibits no distension. There is no tenderness. There is no rebound and no guarding.  Musculoskeletal: Normal range of motion. He exhibits no edema and no tenderness.  Neurological: He is alert and oriented to person, place, and time. Coordination normal.  Skin: Skin is warm and dry. No rash noted. He is not diaphoretic. No erythema. No pallor.  Psychiatric: He has a normal mood and affect. His behavior is normal. Judgment and thought content normal.  Vascular: Femoral pulses are only slightly diminished bilaterally. Distal pulses are not palpable.      ASSESSMENT AND PLAN

## 2012-02-01 ENCOUNTER — Ambulatory Visit: Payer: Medicare Other | Admitting: Internal Medicine

## 2012-02-09 ENCOUNTER — Telehealth: Payer: Self-pay | Admitting: Internal Medicine

## 2012-02-09 MED ORDER — LOVASTATIN 40 MG PO TABS: 40.0000 mg | ORAL_TABLET | Freq: Every day | ORAL | Status: DC

## 2012-02-09 NOTE — Telephone Encounter (Signed)
New Problem:    Patient's wife called in needing a refill of her husbands lovastatin (MEVACOR) 40 MG tablet.

## 2012-02-12 ENCOUNTER — Encounter: Payer: Self-pay | Admitting: Internal Medicine

## 2012-02-12 ENCOUNTER — Ambulatory Visit (INDEPENDENT_AMBULATORY_CARE_PROVIDER_SITE_OTHER): Payer: Medicare Other | Admitting: Internal Medicine

## 2012-02-12 VITALS — BP 114/78 | HR 63 | Ht 68.0 in | Wt 167.0 lb

## 2012-02-12 DIAGNOSIS — J45909 Unspecified asthma, uncomplicated: Secondary | ICD-10-CM

## 2012-02-12 DIAGNOSIS — R05 Cough: Secondary | ICD-10-CM

## 2012-02-12 DIAGNOSIS — J330 Polyp of nasal cavity: Secondary | ICD-10-CM

## 2012-02-12 DIAGNOSIS — Z23 Encounter for immunization: Secondary | ICD-10-CM

## 2012-02-12 MED ORDER — PROMETHAZINE-CODEINE 6.25-10 MG/5ML PO SYRP: ORAL_SOLUTION | ORAL | Status: DC

## 2012-02-12 MED ORDER — FLUTICASONE PROPIONATE 50 MCG/ACT NA SUSP: NASAL | Status: DC

## 2012-02-12 NOTE — Patient Instructions (Addendum)
Script for cough syrup  Script for flonase/ fluticasone nasal steroid spray- Use this every night. For a week or so, you can use it also each morning to get it started.  This is to shrink your polyps.

## 2012-02-12 NOTE — Progress Notes (Signed)
Patient ID: SPERO GUNNELS, male    DOB: 03/11/1921, 78 y.o.   MRN: [id number]  HPI 02/03/11- 89-year-old male never smoker followed for asthma, rhinitis, complicated by AF/pacemaker,/Coumadin, history PE/DVT, DM, HBP, CAD, renal insufficiency...............[name] wife here Last here- October 21, 2009-note reviewed Chest x-ray 10/02/2009 had shown stable basilar scarring otherwise clear lungs with no evidence of fibrosis from his previous amiodarone. He says he feels well, needing to use his nebulizer machine at least once daily through the spring and now in the fall weather. With recent cool weather his ears feel stopped up, decreased hearing, blowing some yellow from nose and chest, throat feels congested. He does not feel tight or wheezy, or short of breath. Appetite is down. He denies fever, blood, swollen glands were swollen feet.  04/07/11-  78 year old male never smoker followed for asthma, rhinitis, complicated by AF/pacemaker,/Coumadin, history PE/DVT, DM, HBP, CAD, renal insufficiency...............[name] wife here He has had flu shot. He says his breathing and allergy problems are "fine", under good control. He complains of his chronic insomnia. He is going to bed at 9:00 but rarely sleepy before 11. His wife gets him up at 6:45 AM. He tried Silenor but it caused headache. Tried cough syrup for sedation but that did not help. Says clonazepam 0.5 mg at bedtime does not help his sleep initiation. This is his chronic pattern, without observation of movement or respiratory disturbance. He does not feel physically uncomfortable in bed.  05/03/2011 Acute OV  Complains of  wheezing,sob,productive cough(yellow and thick). Called in [place] 2 days ago.  No fever or body aches. Coughing is getting worse. Wheezing is worse in early am.  No hemoptysis or chest pain . No edema . Appetite is good.  Feels some better since starting Zpack.   08/10/11-  78 year old male never smoker followed for asthma, rhinitis,  complicated by AF/pacemaker,/Coumadin, history PE/DVT, DM, HBP, CAD, renal insufficiency...............[name] wife here Chest congestion,cough-productive-yellow in color for weeks now-starts and stops; slight wheeze. They say the pattern is not new or changed. Chronic variable productive cough may improve for a few days after antibitotics. No blood and not progressive. Using only a nebulizer machine- dropped off of inhalers. Herat rhythm well controlled on chronic amiodarone w/ pacemaker.   10/11/11- 78 year old male never smoker followed for asthma, rhinitis, complicated by AF/pacemaker,/Coumadin, history PE/DVT, DM, HBP, CAD, renal insufficiency...............[name] wife here She says he has not changed much in the last 2 months. Productive cough with yellow and white "bubbles" but no blood or chest pain. Denies fever or swelling. Nebulizer helps a little with Atrovent used 4 times daily. Advair did not help. He is off of chronic prednisone.  12/11/11 -78 year old male never smoker followed for asthma, rhinitis, complicated by AF/pacemaker,/Coumadin, history PE/DVT, DM, HBP, CAD, renal insufficiency...............[name] wife here Wife thinks his breathing is doing well. Very limited activity due to leg weakness. Gives out quickly with breathing and not sleeping (not due to lung condition) Has no primary care since doctor moved. Dropped off prednisone which did help, but dropped off Advair which did not help. Uses nebulizer 3 times per day.  CXR 6/4 /13 IMPRESSION: Mild chronic changes at the lung bases. Ankylosing  spondylitis.  Original Report Authenticated By: Larey Seat, M.D.   02/12/12- 78 year old male never smoker followed for asthma, rhinitis, complicated by AF/pacemaker,/Coumadin, history PE/DVT, DM, HBP, CAD, renal insufficiency...............[name] wife here Cough-productive-yellow in color(happens all the time) For the past 2 weeks he reports increased nasal congestion. Using saline nasal rinse. Cough  chronically productive of light yellow sputum. No blood, fever or chest pain. Remote history of nasal polyps. He continues prednisone 10 mg daily. COPD assessment test (CABG) 32/40  ROS-see HPI Constitutional:   No-   weight loss, night sweats, fevers, chills, fatigue, lassitude. HEENT:   No-  headaches, difficulty swallowing, tooth/dental problems, sore throat,       No-  sneezing, itching, ear ache, +nasal congestion, +post nasal drip,  CV:  No-   chest pain, orthopnea, PND, swelling in lower extremities, anasarca,dizziness, palpitations Resp:  +Chronic shortness of breath with exertion or at rest.              + productive cough, No- non-productive cough,  No- coughing up of blood.              + change in color of mucus.  No- wheezing.   Skin: No-   rash or lesions. GI:  No-   heartburn, indigestion, abdominal pain, nausea, vomiting,  GU: MS:  No-   joint pain or swelling.   Neuro-     nothing unusual Psych:  No- change in mood or affect. No depression or anxiety.  No memory loss.  OBJ- Physical Exam General- Alert, Oriented, Affect-appropriate, Distress- none acute;  Quite elderly, w/ walker Skin- rash-none, lesions- none, excoriation- none Lymphadenopathy- none Head- atraumatic            Eyes- Gross vision intact, PERRLA, + ectropion left eye, secretions clear            Ears- Hearing diminished            Nose- + bilateral nasal polyps , no-Septal dev, mucus,  erosion, perforation             Throat- Mallampati II , mucosa clear , drainage- none, tonsils- atrophic Neck- flexible , trachea midline, no stridor , thyroid nl, carotid no bruit Chest - symmetrical excursion , unlabored           Heart/CV- RRR with skips , no murmur , no gallop  , no rub, nl s1 s2                           - JVD- none , edema- none, stasis changes- none, varices- none           Lung- + distant with a few crackles , dullness-none, rub- none           Chest wall- L pacemaker Abd-  Br/ Gen/ Rectal-  Not done, not indicated Extrem- cyanosis- none, clubbing, none, atrophy- none, strength- nl Neuro- grossly intact to observation

## 2012-02-14 ENCOUNTER — Other Ambulatory Visit: Payer: Self-pay | Admitting: Internal Medicine

## 2012-02-19 NOTE — Assessment & Plan Note (Signed)
Recurrence of nasal polyps. We discussed role of steroids. He is frail and likely not to candidate for a third surgical procedure. Plan-Flonase

## 2012-02-19 NOTE — Assessment & Plan Note (Signed)
Fair control. Plan-flu vaccine

## 2012-02-22 ENCOUNTER — Other Ambulatory Visit (INDEPENDENT_AMBULATORY_CARE_PROVIDER_SITE_OTHER): Payer: Medicare Other

## 2012-02-22 ENCOUNTER — Ambulatory Visit (INDEPENDENT_AMBULATORY_CARE_PROVIDER_SITE_OTHER): Payer: Medicare Other | Admitting: Internal Medicine

## 2012-02-22 ENCOUNTER — Ambulatory Visit: Payer: Medicare Other | Admitting: Internal Medicine

## 2012-02-22 ENCOUNTER — Encounter: Payer: Self-pay | Admitting: Internal Medicine

## 2012-02-22 VITALS — BP 128/62 | HR 64 | Temp 97.8°F | Ht 68.0 in | Wt 165.0 lb

## 2012-02-22 DIAGNOSIS — R5383 Other fatigue: Secondary | ICD-10-CM

## 2012-02-22 DIAGNOSIS — N259 Disorder resulting from impaired renal tubular function, unspecified: Secondary | ICD-10-CM

## 2012-02-22 DIAGNOSIS — E119 Type 2 diabetes mellitus without complications: Secondary | ICD-10-CM

## 2012-02-22 DIAGNOSIS — G4700 Insomnia, unspecified: Secondary | ICD-10-CM

## 2012-02-22 DIAGNOSIS — R5381 Other malaise: Secondary | ICD-10-CM

## 2012-02-22 DIAGNOSIS — E785 Hyperlipidemia, unspecified: Secondary | ICD-10-CM

## 2012-02-22 LAB — CBC WITH DIFFERENTIAL/PLATELET
Basophils Relative: 0.4 % (ref 0.0–3.0)
Eosinophils Absolute: 2.5 10*3/uL — ABNORMAL HIGH (ref 0.0–0.7)
Eosinophils Relative: 19.4 % — ABNORMAL HIGH (ref 0.0–5.0)
Lymphocytes Relative: 18.9 % (ref 12.0–46.0)
Neutrophils Relative %: 54 % (ref 43.0–77.0)
RBC: 5.1 Mil/uL (ref 4.22–5.81)
WBC: 13 10*3/uL — ABNORMAL HIGH (ref 4.5–10.5)

## 2012-02-22 LAB — HEPATIC FUNCTION PANEL
AST: 20 U/L (ref 0–37)
Albumin: 3.4 g/dL — ABNORMAL LOW (ref 3.5–5.2)
Alkaline Phosphatase: 88 U/L (ref 39–117)
Total Protein: 6.5 g/dL (ref 6.0–8.3)

## 2012-02-22 LAB — LIPID PANEL
HDL: 36.2 mg/dL — ABNORMAL LOW (ref >39.00)
Triglycerides: 105 mg/dL (ref 0.0–149.0)

## 2012-02-22 LAB — BASIC METABOLIC PANEL
Calcium: 8.9 mg/dL (ref 8.4–10.5)
Creatinine, Ser: 2.1 mg/dL — ABNORMAL HIGH (ref 0.4–1.5)
GFR: 32.27 mL/min — ABNORMAL LOW (ref >60.00)

## 2012-02-22 LAB — HEMOGLOBIN A1C: Hgb A1c MFr Bld: 6.3 % (ref 4.6–6.5)

## 2012-02-22 MED ORDER — GLIPIZIDE 5 MG PO TABS: 7.5000 mg | ORAL_TABLET | Freq: Every day | ORAL | Status: DC

## 2012-02-22 NOTE — Assessment & Plan Note (Signed)
Chronic issue, dependent upon benzodiazepines. Alternates between lorazepam and clonazepam as prescribed by pulmonary. Reviewed appropriate use of these medications today

## 2012-02-22 NOTE — Progress Notes (Signed)
Subjective:    Patient ID: [name], male    DOB: 12/24/1920, 78 y.o.   MRN: [id number]  HPI New patient to me and our division, but known to our practice.  Here to establish with new primary care Also follows with pulmonary Dr. Maple Hudson and cardiology services/anticoagulation clinic  Reviewed chronic medical issues today:  Type 2 diabetes. Takes oral hypoglycemic agent each morning. Denies recent dose changes. Denies hypoglycemic symptoms. Check CBGs each a.m. and as needed. Complicated by mild peripheral neuropathy with which he follows at podiatry every 3-6 month  COPD. Follows regularly with pulmonary. Reports compliance with medication inhalers as prescribed. Occasional prednisone as needed for chest congestion and tightness. Denies flare at this time. Chronic chest congestion and cough, complicated by allergic rhinitis and nasal polyps.  Atrial fibrillation. Follows with cardiology and maintained on chronic anticoagulation for same. Chronic anticoagulation also indicated for recurrent DVT and history of PE.  Past Medical History  Diagnosis Date  . Presence of permanent cardiac pacemaker 04/2006 upgrade    Medtronic Adapta  . Atrial fibrillation   . HTN (hypertension)   . DM (diabetes mellitus)   . CAD (coronary artery disease)     DES to cx  . Pulmonary embolism 12/2003  . Deep vein thrombophlebitis of leg 2005, 2009    recurrent when off anticoag  . Nasal polyp   . Allergic rhinitis   . Asthma   . Renal insufficiency   . Tachy-brady syndrome     s/p PPM   Family History  Problem Relation Age of Onset  . Diabetes Neg Hx   . Hypertension Neg Hx   . Coronary artery disease Neg Hx    History  Substance Use Topics  . Smoking status: Never Smoker   . Smokeless tobacco: Not on file   Comment: married since 1952 to wife [name], retrired Journalist, newspaper  . Alcohol Use: Not on file   Review of Systems Constitutional: Negative for fever or weight change.  positive  for fatigue.  Respiratory: Negative for cough and shortness of breath.   Cardiovascular: Negative for chest pain or palpitations.  Gastrointestinal: Negative for abdominal pain, no bowel changes.  Musculoskeletal: Negative for gait problem or joint swelling.  Skin: Negative for rash.  Neurological: Negative for dizziness or headache.  No other specific complaints in a complete review of systems (except as listed in HPI above).     Objective:   Physical Exam BP 128/62  Pulse 64  Temp 97.8 F (36.6 C) (Oral)  Ht 5\' 8"  (1.727 m)  Wt 165 lb (74.844 kg)  BMI 25.09 kg/m2  SpO2 95% Wt Readings from Last 3 Encounters:  02/22/12 165 lb (74.844 kg)  02/12/12 167 lb (75.751 kg)  01/31/12 173 lb (78.472 kg)   Constitutional:  He appears well-developed and well-nourished. No distress. Wife [name] at side HENT: NCAT, HOH - B hearing aides, OP clear. Naris with constant but mild clear nasal drainage Eyes: clear tearing - out turned, drooping lower lids with conjunctiva membrane exposed - PERRL, vision grossly intact - no icterus or scleral injection Neck: Normal range of motion. Neck supple. No JVD present. No thyromegaly present.  Cardiovascular: Normal rate, irregular rhythm and normal heart sounds.  No murmur heard. no BLE edema Pulmonary/Chest: Effort normal, breath sounds with right-sided greater than left rhonchi and expiratory wheeze. No respiratory distress.   Abdominal: Soft. Bowel sounds are normal. Patient exhibits no distension. There is no tenderness.  Musculoskeletal: Normal range of  motion. Patient exhibits no gross deformity.  gait is cautious and supported with use of rolling walker  Neurological: he is alert and oriented to person, place, and time. No cranial nerve deficit. Coordination slow but normal.  Skin: Senile changes. Skin is warm and dry.  No erythema or ulceration.  Psychiatric: he has a normal mood and affect. behavior is normal. Judgment and thought content normal.     Lab Results  Component Value Date   WBC 10.8* 03/01/2010   HGB 11.2* 03/01/2010   HCT 34.7* 03/01/2010   PLT 214 03/01/2010   GLUCOSE 209* 03/01/2010   CHOL  Value: 112        ATP III CLASSIFICATION:  <200     mg/dL   Desirable  409-811  mg/dL   Borderline High  >=914    mg/dL   High        78/29/5621   TRIG 91 02/24/2010   HDL 37* 02/24/2010   LDLCALC  Value: 57        Total Cholesterol/HDL:CHD Risk Coronary Heart Disease Risk Table                     Men   Women  1/2 Average Risk   3.4   3.3  Average Risk       5.0   4.4  2 X Average Risk   9.6   7.1  3 X Average Risk  23.4   11.0        Use the calculated Patient Ratio above and the CHD Risk Table to determine the patient's CHD Risk.        ATP III CLASSIFICATION (LDL):  <100     mg/dL   Optimal  308-657  mg/dL   Near or Above                    Optimal  130-159  mg/dL   Borderline  846-962  mg/dL   High  >952     mg/dL   Very High 84/13/2440   ALT 15 02/24/2010   AST 33 02/24/2010   NA 142 03/01/2010   K 4.4 03/01/2010   CL 111 03/01/2010   CREATININE 1.95* 03/01/2010   BUN 44* 03/01/2010   CO2 26 03/01/2010   TSH 2.372 02/24/2010   INR 2.7 01/24/2012   HGBA1C  Value: 7.0 (NOTE)   The ADA recommends the following therapeutic goal for glycemic   control related to Hgb A1C measurement:   Goal of Therapy:   < 7.0% Hgb A1C   Reference: American Diabetes Association: Clinical Practice   Recommendations 2008, Diabetes Care,  2008, 31:(Suppl 1).* 07/24/2008        Assessment & Plan:   See problem list. Medications and labs reviewed today.  Fatigue - nonspecific symptoms/exam - check screening labs  Time spent with pt/family today 45 minutes, greater than 50% time spent counseling patient on diabetes, cardiac history and medication review. Also review of prior records

## 2012-02-22 NOTE — Patient Instructions (Signed)
It was good to see you today. We have reviewed your prior records including labs and tests today Medications reviewed, no changes at this time. Refill on medication(s) as discussed today. Test(s) ordered today. Your results will be released to MyChart (or called to you) after review, usually within 72hours after test completion. If any changes need to be made, you will be notified at that same time. Please schedule followup in 3-4 months, call sooner if problems.

## 2012-02-22 NOTE — Assessment & Plan Note (Signed)
Chronic dz -  Recheck lytes/Cr now

## 2012-02-22 NOTE — Assessment & Plan Note (Signed)
On statin.  Check lipids today 

## 2012-02-22 NOTE — Assessment & Plan Note (Signed)
Long standing dx with neurologic complication (peripheral neuropathy) - follows with podiatry for same Denies symptomatic hypoglycemia -  Check a1c now and titrate as needed

## 2012-04-16 ENCOUNTER — Ambulatory Visit (INDEPENDENT_AMBULATORY_CARE_PROVIDER_SITE_OTHER): Payer: Medicare Other | Admitting: *Deleted

## 2012-04-16 DIAGNOSIS — I4891 Unspecified atrial fibrillation: Secondary | ICD-10-CM

## 2012-04-16 DIAGNOSIS — I2699 Other pulmonary embolism without acute cor pulmonale: Secondary | ICD-10-CM

## 2012-04-16 DIAGNOSIS — I82409 Acute embolism and thrombosis of unspecified deep veins of unspecified lower extremity: Secondary | ICD-10-CM

## 2012-04-18 ENCOUNTER — Telehealth: Payer: Self-pay | Admitting: Internal Medicine

## 2012-04-18 ENCOUNTER — Other Ambulatory Visit: Payer: Self-pay | Admitting: Cardiology

## 2012-04-18 MED ORDER — METOPROLOL SUCCINATE ER 25 MG PO TB24: 25.0000 mg | ORAL_TABLET | Freq: Every day | ORAL | Status: DC

## 2012-04-18 NOTE — Telephone Encounter (Signed)
Pt's wife calling re refill metoprolol 25mg  ,  [facility] mail order 90 day supply

## 2012-04-19 ENCOUNTER — Telehealth: Payer: Self-pay | Admitting: Internal Medicine

## 2012-04-19 MED ORDER — PROMETHAZINE-CODEINE 6.25-10 MG/5ML PO SYRP: ORAL_SOLUTION | ORAL | Status: DC

## 2012-04-19 MED ORDER — CLONAZEPAM 0.5 MG PO TABS: 0.5000 mg | ORAL_TABLET | Freq: Every evening | ORAL | Status: DC | PRN

## 2012-04-19 NOTE — Telephone Encounter (Signed)
Pt informed that refill for cough syrup and Clonazepam were sent to pharmacy.  Pt notified that pharmacy is out of cough syrup but will get order in on Monday and have rx ready for pt.

## 2012-04-19 NOTE — Telephone Encounter (Signed)
Please advise if ok to refill Promethazine with Codeine and Clonazepam per pt request.   Last ov: 02-12-12    Next ov:  06-13-12 Allergies  Allergen Reactions  . Asa Buff (Mag (Buffered Aspirin)   . Ibuprofen     REACTION: causes nausea and vomitting

## 2012-06-05 ENCOUNTER — Other Ambulatory Visit: Payer: Self-pay | Admitting: Internal Medicine

## 2012-06-13 ENCOUNTER — Other Ambulatory Visit: Payer: Self-pay | Admitting: Internal Medicine

## 2012-06-13 ENCOUNTER — Ambulatory Visit: Payer: Medicare Other | Admitting: Internal Medicine

## 2012-06-13 NOTE — Telephone Encounter (Signed)
Done. [name], CMA  

## 2012-06-13 NOTE — Telephone Encounter (Signed)
Please advise if okay to refill. Thanks.  

## 2012-06-13 NOTE — Telephone Encounter (Signed)
Ok to refill 

## 2012-06-13 NOTE — Telephone Encounter (Signed)
Spoke to pt wife.  Pt has been very weak and hard to get out of the house.  He is not out of Coumadin and is compliant in taking it.  He has an appt with Dr. Maple Hudson in Pulmonary at [facility] office 06/26/12 - rescheduled from today 1/30.  I have encouraged them to keep that appt and they can get his PT/INR checked there in Coumadin Clinic at [facility].

## 2012-06-18 ENCOUNTER — Encounter: Payer: Self-pay | Admitting: Internal Medicine

## 2012-06-18 ENCOUNTER — Ambulatory Visit (INDEPENDENT_AMBULATORY_CARE_PROVIDER_SITE_OTHER): Payer: Medicare Other | Admitting: Internal Medicine

## 2012-06-18 VITALS — BP 130/72 | HR 82 | Temp 97.4°F | Wt 169.4 lb

## 2012-06-18 DIAGNOSIS — I4891 Unspecified atrial fibrillation: Secondary | ICD-10-CM

## 2012-06-18 DIAGNOSIS — M064 Inflammatory polyarthropathy: Secondary | ICD-10-CM

## 2012-06-18 DIAGNOSIS — I10 Essential (primary) hypertension: Secondary | ICD-10-CM

## 2012-06-18 DIAGNOSIS — M1990 Unspecified osteoarthritis, unspecified site: Secondary | ICD-10-CM

## 2012-06-18 DIAGNOSIS — E119 Type 2 diabetes mellitus without complications: Secondary | ICD-10-CM

## 2012-06-18 MED ORDER — PREDNISONE (PAK) 10 MG PO TABS: 10.0000 mg | ORAL_TABLET | ORAL | Status: DC

## 2012-06-18 MED ORDER — HYDROCODONE-ACETAMINOPHEN 5-325 MG PO TABS: 0.5000 | ORAL_TABLET | Freq: Three times a day (TID) | ORAL | Status: DC | PRN

## 2012-06-18 NOTE — Assessment & Plan Note (Signed)
Rate controlled on current meds Chronic anticoag reviewed followup EP/cards as planned, call sooner if symptomatic

## 2012-06-18 NOTE — Progress Notes (Signed)
  Subjective:    Patient ID: [name], male    DOB: Aug 09, 1920, 79 y.o.   MRN: [id number]  HPI  complains of arthritis flare -R>L wrist and shoulder Min improved with tylenol otc, not taking NSAIDs due to warfarin use Hx same - prev on daily pred prn - none since 10/2011 per wife Denies trauma or injury, no fever  Past Medical History  Diagnosis Date  . Presence of permanent cardiac pacemaker 04/2006 upgrade    Medtronic Adapta  . Atrial fibrillation   . HTN (hypertension)   . DM (diabetes mellitus)   . CAD (coronary artery disease)     DES to cx  . Pulmonary embolism 12/2003  . Deep vein thrombophlebitis of leg 2005, 2009    recurrent when off anticoag  . Nasal polyp   . Allergic rhinitis   . Asthma   . Renal insufficiency   . Tachy-brady syndrome     s/p PPM    Review of Systems  Constitutional: Positive for fatigue. Negative for fever.  Musculoskeletal: Positive for back pain, joint swelling and arthralgias. Negative for myalgias.  Neurological: Negative for weakness, light-headedness, numbness and headaches.       Objective:   Physical Exam BP 130/72  Pulse 82  Temp 97.4 F (36.3 C) (Oral)  Wt 169 lb 6.4 oz (76.839 kg)  SpO2 94% Gen: NAD, nontoxic - wife at side MSkel: R>L wrist with warmth/inflammation and mild swelling without effusion - FROM with pain, ligamentous function intact Lung CTAB CV: irreg irreg  Lab Results  Component Value Date   WBC 13.0* 02/22/2012   HGB 15.5 02/22/2012   HCT 48.3 02/22/2012   PLT 224.0 02/22/2012   GLUCOSE 109* 02/22/2012   CHOL 159 02/22/2012   TRIG 105.0 02/22/2012   HDL 36.20* 02/22/2012   LDLCALC 102* 02/22/2012   ALT 12 02/22/2012   AST 20 02/22/2012   NA 141 02/22/2012   K 4.3 02/22/2012   CL 102 02/22/2012   CREATININE 2.1* 02/22/2012   BUN 21 02/22/2012   CO2 32 02/22/2012   TSH 2.93 02/22/2012   INR 1.9 04/16/2012   HGBA1C 6.3 02/22/2012       Assessment & Plan:   Inflammatory arthritis B  wrists - ?autoimmune vs crystal dz - hx same - previously maintained on steroids for mgmt of same - hx AS reviewed- tx with burst pred taper x 6 days, then resume 10mg  qd prn - refill norco to use low dose prn - to call if worse or unimproved

## 2012-06-18 NOTE — Assessment & Plan Note (Signed)
Long standing dx with neurologic complication (peripheral neuropathy) - follows with podiatry for same Denies symptomatic hypoglycemia -  patient advised on anticipated increase in cbgs due to steroid effect for next several days - pt will call if cbg>200  Lab Results  Component Value Date   HGBA1C 6.3 02/22/2012

## 2012-06-18 NOTE — Assessment & Plan Note (Signed)
BP Readings from Last 3 Encounters:  06/18/12 130/72  02/22/12 128/62  02/12/12 114/78   The current medical regimen is effective;  continue present plan and medications.

## 2012-06-18 NOTE — Patient Instructions (Addendum)
It was good to see you today. Take prednisone taper for next 6 days as discussed to treat arthritis flare symptoms Okay to use Norco tablet/hydrocodone half or whole tablet every 8 hours as needed for pain flares Your prescription(s) have been submitted to your pharmacy. Please take as directed and contact our office if you believe you are having problem(s) with the medication(s). Other medications reviewed and updated, no additional changes Please call if symptoms unimproved, sooner if worse Please schedule followup in 3 months for general review of chronic issues

## 2012-06-26 ENCOUNTER — Ambulatory Visit: Payer: Medicare Other | Admitting: Internal Medicine

## 2012-06-26 ENCOUNTER — Encounter: Payer: Medicare Other | Admitting: General Practice

## 2012-06-28 ENCOUNTER — Other Ambulatory Visit: Payer: Self-pay | Admitting: Internal Medicine

## 2012-07-01 ENCOUNTER — Telehealth: Payer: Self-pay | Admitting: Internal Medicine

## 2012-07-01 NOTE — Telephone Encounter (Signed)
Called, spoke with pt's wife.  States they would like to go ahead and schedule an appt with Dr. Maple Hudson.  Once this is scheduled, wife states she will call Coumadin Clinic to see if pt can be worked in there.  We have scheduled pt to see Dr. Maple Hudson on tomorrow, Feb 19 at 10:15 am.  Wife is aware and voiced no further questions or concerns at this time.

## 2012-07-02 ENCOUNTER — Encounter: Payer: Self-pay | Admitting: Internal Medicine

## 2012-07-02 ENCOUNTER — Ambulatory Visit (INDEPENDENT_AMBULATORY_CARE_PROVIDER_SITE_OTHER): Payer: Medicare Other | Admitting: Internal Medicine

## 2012-07-02 ENCOUNTER — Encounter (INDEPENDENT_AMBULATORY_CARE_PROVIDER_SITE_OTHER): Payer: Medicare Other

## 2012-07-02 VITALS — BP 138/62 | HR 84 | Ht 68.0 in | Wt 168.2 lb

## 2012-07-02 DIAGNOSIS — J309 Allergic rhinitis, unspecified: Secondary | ICD-10-CM

## 2012-07-02 DIAGNOSIS — J45909 Unspecified asthma, uncomplicated: Secondary | ICD-10-CM

## 2012-07-02 DIAGNOSIS — R0989 Other specified symptoms and signs involving the circulatory and respiratory systems: Secondary | ICD-10-CM

## 2012-07-02 MED ORDER — LORAZEPAM 0.5 MG PO TABS: ORAL_TABLET | ORAL | Status: DC

## 2012-07-02 MED ORDER — LEVALBUTEROL HCL 0.63 MG/3ML IN NEBU
0.6300 mg | INHALATION_SOLUTION | Freq: Once | RESPIRATORY_TRACT | Status: AC
  Administered 2012-07-02: 0.63 mg via RESPIRATORY_TRACT

## 2012-07-02 MED ORDER — METHYLPREDNISOLONE ACETATE 80 MG/ML IJ SUSP
80.0000 mg | Freq: Once | INTRAMUSCULAR | Status: AC
  Administered 2012-07-02: 80 mg via INTRAMUSCULAR

## 2012-07-02 MED ORDER — FLUTICASONE PROPIONATE 50 MCG/ACT NA SUSP: NASAL | Status: DC

## 2012-07-02 NOTE — Patient Instructions (Addendum)
We removed Advair from your med list since it isn't helping  Script for Flonase/ fluticasone nasal spray sent to drug store  Depo 80  Neb xop 0.63

## 2012-07-02 NOTE — Progress Notes (Signed)
Patient ID: [name], male    DOB: 03/11/1921, 79 y.o.   MRN: [id number]  HPI 02/03/11- 89-year-old male never smoker followed for asthma, rhinitis, complicated by AF/pacemaker,/Coumadin, history PE/DVT, DM, HBP, CAD, renal insufficiency...............[name] wife here Last here- October 21, 2009-note reviewed Chest x-ray 10/02/2009 had shown stable basilar scarring otherwise clear lungs with no evidence of fibrosis from his previous amiodarone. He says he feels well, needing to use his nebulizer machine at least once daily through the spring and now in the fall weather. With recent cool weather his ears feel stopped up, decreased hearing, blowing some yellow from nose and chest, throat feels congested. He does not feel tight or wheezy, or short of breath. Appetite is down. He denies fever, blood, swollen glands were swollen feet.  04/07/11-  79 year old male never smoker followed for asthma, rhinitis, complicated by AF/pacemaker,/Coumadin, history PE/DVT, DM, HBP, CAD, renal insufficiency...............[name] wife here He has had flu shot. He says his breathing and allergy problems are "fine", under good control. He complains of his chronic insomnia. He is going to bed at 9:00 but rarely sleepy before 11. His wife gets him up at 6:45 AM. He tried Silenor but it caused headache. Tried cough syrup for sedation but that did not help. Says clonazepam 0.5 mg at bedtime does not help his sleep initiation. This is his chronic pattern, without observation of movement or respiratory disturbance. He does not feel physically uncomfortable in bed.  05/03/2011 Acute OV  Complains of  wheezing,sob,productive cough(yellow and thick). Called in [place] 2 days ago.  No fever or body aches. Coughing is getting worse. Wheezing is worse in early am.  No hemoptysis or chest pain . No edema . Appetite is good.  Feels some better since starting Zpack.   08/10/11-  79 year old male never smoker followed for asthma, rhinitis,  complicated by AF/pacemaker,/Coumadin, history PE/DVT, DM, HBP, CAD, renal insufficiency...............[name] wife here Chest congestion,cough-productive-yellow in color for weeks now-starts and stops; slight wheeze. They say the pattern is not new or changed. Chronic variable productive cough may improve for a few days after antibitotics. No blood and not progressive. Using only a nebulizer machine- dropped off of inhalers. Herat rhythm well controlled on chronic amiodarone w/ pacemaker.   10/11/11- 79 year old male never smoker followed for asthma, rhinitis, complicated by AF/pacemaker,/Coumadin, history PE/DVT, DM, HBP, CAD, renal insufficiency...............[name] wife here She says he has not changed much in the last 2 months. Productive cough with yellow and white "bubbles" but no blood or chest pain. Denies fever or swelling. Nebulizer helps a little with Atrovent used 4 times daily. Advair did not help. He is off of chronic prednisone.  12/11/11 -78 year old male never smoker followed for asthma, rhinitis, complicated by AF/pacemaker,/Coumadin, history PE/DVT, DM, HBP, CAD, renal insufficiency...............[name] wife here Wife thinks his breathing is doing well. Very limited activity due to leg weakness. Gives out quickly with breathing and not sleeping (not due to lung condition) Has no primary care since doctor moved. Dropped off prednisone which did help, but dropped off Advair which did not help. Uses nebulizer 3 times per day.  CXR 6/4 /13 IMPRESSION: Mild chronic changes at the lung bases. Ankylosing  spondylitis.  Original Report Authenticated By: Larey Seat, M.D.   02/12/12- 79 year old male never smoker followed for asthma, rhinitis, complicated by AF/pacemaker,/Coumadin, history PE/DVT, DM, HBP, CAD, renal insufficiency...............[name] wife here Cough-productive-yellow in color(happens all the time) For the past 2 weeks he reports increased nasal congestion. Using saline nasal rinse. Cough  chronically productive of light yellow sputum. No blood, fever or chest pain. Remote history of nasal polyps. He continues prednisone 10 mg daily. COPD assessment test (CABG) 32/40  07/02/12- 79 year old male never smoker followed for asthma, rhinitis, complicated by AF/pacemaker,/Coumadin, history PE/DVT, DM, HBP, CAD, renal insufficiency...............[name] wife here FOLLOWS FOR: increased chest congestion-yellow in color and lots of wheezing per wife, who watches over him pretty well. Primary care gave prednisone for swelling in his hand 2 weeks ago. It did not seem to help his breathing. No acute event. Sputum yellow or white with no blood or at chronic productive cough "no energy". He stopped Advair, preferring his nebulizer machine. He asks refill lorazepam for sleep. We discussed this carefully.  ROS-see HPI Constitutional:   No-   weight loss, night sweats, fevers, chills, fatigue, lassitude. HEENT:   No-  headaches, difficulty swallowing, tooth/dental problems, sore throat,       No-  sneezing, itching, ear ache, +nasal congestion, no-post nasal drip,  CV:  No-   chest pain, orthopnea, PND, swelling in lower extremities, anasarca,dizziness, palpitations Resp:  +Chronic shortness of breath with exertion or at rest.              + productive cough, No- non-productive cough,  No- coughing up of blood.              + change in color of mucus.  + wheezing.   Skin: No-   rash or lesions. GI:  No-   heartburn, indigestion, abdominal pain, nausea, vomiting,  GU: MS:  No-   joint pain or swelling.   Neuro-     nothing unusual Psych:  No- change in mood or affect. No depression or anxiety.  No memory loss.  OBJ- Physical Exam General- Alert, Oriented, Affect-appropriate, seems sharp, Distress- none acute;  Quite elderly, w/ walker Skin- rash-none, lesions- none, excoriation- none Lymphadenopathy- none Head- atraumatic            Eyes- Gross vision intact, PERRLA, + ectropion left eye,  secretions clear            Ears- Hearing diminished            Nose- + bilateral nasal polyps , no-Septal dev, mucus,  erosion, perforation             Throat- Mallampati II , mucosa clear , drainage- none, tonsils- atrophic Neck- flexible , trachea midline, no stridor , thyroid nl, carotid no bruit Chest - symmetrical excursion , unlabored           Heart/CV- RRR with skips , no murmur , no gallop  , no rub, nl s1 s2                           - JVD- none , edema- none, stasis changes- none, varices- none           Lung- + bilateral wheeze with coarse breath sounds , dullness-none, rub- none           Chest wall- L pacemaker Abd-  Br/ Gen/ Rectal- Not done, not indicated Extrem- cyanosis- none, clubbing, none, atrophy- none, strength- nl Neuro- grossly intact to observation

## 2012-07-03 NOTE — Assessment & Plan Note (Signed)
Plan-Depo-Medrol

## 2012-07-03 NOTE — Assessment & Plan Note (Signed)
Significant bronchitis component. Does not sound purulent. Plan-Depo-Medrol 80 mg, steroid talk, nebulizer Xopenex

## 2012-07-04 ENCOUNTER — Ambulatory Visit (INDEPENDENT_AMBULATORY_CARE_PROVIDER_SITE_OTHER): Payer: Medicare Other | Admitting: *Deleted

## 2012-07-04 DIAGNOSIS — I82409 Acute embolism and thrombosis of unspecified deep veins of unspecified lower extremity: Secondary | ICD-10-CM

## 2012-07-04 DIAGNOSIS — I2699 Other pulmonary embolism without acute cor pulmonale: Secondary | ICD-10-CM

## 2012-07-04 DIAGNOSIS — I4891 Unspecified atrial fibrillation: Secondary | ICD-10-CM

## 2012-07-04 LAB — POCT INR: INR: 1.4

## 2012-07-04 MED ORDER — WARFARIN SODIUM 1 MG PO TABS: ORAL_TABLET | ORAL | Status: DC

## 2012-07-16 ENCOUNTER — Other Ambulatory Visit: Payer: Self-pay | Admitting: Internal Medicine

## 2012-07-16 NOTE — Telephone Encounter (Signed)
Rx has been called in per CY. 

## 2012-07-16 NOTE — Telephone Encounter (Signed)
Please advise if okay to refill. Thanks.  

## 2012-07-16 NOTE — Telephone Encounter (Signed)
Ok to refill 

## 2012-07-18 ENCOUNTER — Ambulatory Visit (INDEPENDENT_AMBULATORY_CARE_PROVIDER_SITE_OTHER): Payer: Medicare Other | Admitting: *Deleted

## 2012-07-18 ENCOUNTER — Ambulatory Visit (INDEPENDENT_AMBULATORY_CARE_PROVIDER_SITE_OTHER): Payer: Medicare Other | Admitting: Cardiology

## 2012-07-18 ENCOUNTER — Encounter: Payer: Self-pay | Admitting: Internal Medicine

## 2012-07-18 DIAGNOSIS — I2699 Other pulmonary embolism without acute cor pulmonale: Secondary | ICD-10-CM

## 2012-07-18 DIAGNOSIS — R001 Bradycardia, unspecified: Secondary | ICD-10-CM

## 2012-07-18 DIAGNOSIS — I82409 Acute embolism and thrombosis of unspecified deep veins of unspecified lower extremity: Secondary | ICD-10-CM

## 2012-07-18 DIAGNOSIS — I498 Other specified cardiac arrhythmias: Secondary | ICD-10-CM

## 2012-07-18 DIAGNOSIS — Z950 Presence of cardiac pacemaker: Secondary | ICD-10-CM

## 2012-07-18 DIAGNOSIS — I495 Sick sinus syndrome: Secondary | ICD-10-CM

## 2012-07-18 DIAGNOSIS — I4891 Unspecified atrial fibrillation: Secondary | ICD-10-CM

## 2012-07-18 LAB — PACEMAKER DEVICE OBSERVATION
AL IMPEDENCE PM: 510 Ohm
AL THRESHOLD: 1 V
ATRIAL PACING PM: 45.7
BAMS-0001: 175 {beats}/min
RV LEAD AMPLITUDE: 11.2 mv
RV LEAD THRESHOLD: 0.75 V

## 2012-07-18 LAB — POCT INR: INR: 2.4

## 2012-07-18 NOTE — Progress Notes (Signed)
PPM check/device clinic visit. See [facility] report. 

## 2012-07-23 ENCOUNTER — Telehealth: Payer: Self-pay | Admitting: Internal Medicine

## 2012-07-23 NOTE — Telephone Encounter (Signed)
Per pt's chart, promethazine-codeine cough syrup was called into [facility] on 07/16/12 #200 x 0.  I called [facility], spoke with Tammy.  Was advised they did receive the rx was it was placed on hold because it is not covered pt the pt's insurance.  No Prior Auth available either.  Cost will be $28.09.    I ATC pt at # provided to inform of above but NA and no option to leave msg. WCB

## 2012-07-24 NOTE — Telephone Encounter (Signed)
ATC NA phone rang several time. No option to leave VM [facility]

## 2012-07-25 NOTE — Telephone Encounter (Signed)
Pt spouse is aware rx at pharmacy. Carron Curie, CMA

## 2012-08-06 ENCOUNTER — Encounter: Payer: Self-pay | Admitting: Internal Medicine

## 2012-08-08 ENCOUNTER — Ambulatory Visit (INDEPENDENT_AMBULATORY_CARE_PROVIDER_SITE_OTHER): Payer: Medicare Other

## 2012-08-08 DIAGNOSIS — I82409 Acute embolism and thrombosis of unspecified deep veins of unspecified lower extremity: Secondary | ICD-10-CM

## 2012-08-08 DIAGNOSIS — I2699 Other pulmonary embolism without acute cor pulmonale: Secondary | ICD-10-CM

## 2012-08-08 DIAGNOSIS — I4891 Unspecified atrial fibrillation: Secondary | ICD-10-CM

## 2012-08-08 LAB — POCT INR: INR: 2.1

## 2012-08-26 ENCOUNTER — Other Ambulatory Visit: Payer: Self-pay | Admitting: Internal Medicine

## 2012-08-26 ENCOUNTER — Encounter: Payer: Self-pay | Admitting: Internal Medicine

## 2012-08-26 ENCOUNTER — Other Ambulatory Visit (INDEPENDENT_AMBULATORY_CARE_PROVIDER_SITE_OTHER): Payer: Medicare Other

## 2012-08-26 ENCOUNTER — Ambulatory Visit (INDEPENDENT_AMBULATORY_CARE_PROVIDER_SITE_OTHER): Payer: Medicare Other | Admitting: Internal Medicine

## 2012-08-26 VITALS — BP 120/68 | HR 96 | Temp 97.5°F | Wt 165.8 lb

## 2012-08-26 DIAGNOSIS — N058 Unspecified nephritic syndrome with other morphologic changes: Secondary | ICD-10-CM

## 2012-08-26 DIAGNOSIS — E1129 Type 2 diabetes mellitus with other diabetic kidney complication: Secondary | ICD-10-CM

## 2012-08-26 DIAGNOSIS — M064 Inflammatory polyarthropathy: Secondary | ICD-10-CM

## 2012-08-26 DIAGNOSIS — M1990 Unspecified osteoarthritis, unspecified site: Secondary | ICD-10-CM

## 2012-08-26 DIAGNOSIS — I10 Essential (primary) hypertension: Secondary | ICD-10-CM

## 2012-08-26 LAB — BASIC METABOLIC PANEL
BUN: 24 mg/dL — ABNORMAL HIGH (ref 6–23)
Calcium: 9 mg/dL (ref 8.4–10.5)
Chloride: 99 mEq/L (ref 96–112)
Creatinine, Ser: 2 mg/dL — ABNORMAL HIGH (ref 0.4–1.5)
GFR: 34.33 mL/min — ABNORMAL LOW (ref >60.00)

## 2012-08-26 LAB — HEMOGLOBIN A1C: Hgb A1c MFr Bld: 7.2 % — ABNORMAL HIGH (ref 4.6–6.5)

## 2012-08-26 MED ORDER — PREDNISONE 5 MG PO TBEC: 5.0000 mg | DELAYED_RELEASE_TABLET | Freq: Every day | ORAL | Status: DC

## 2012-08-26 MED ORDER — PREDNISONE (PAK) 10 MG PO TABS: 10.0000 mg | ORAL_TABLET | ORAL | Status: AC

## 2012-08-26 MED ORDER — HYDROCODONE-ACETAMINOPHEN 5-325 MG PO TABS: 0.5000 | ORAL_TABLET | Freq: Three times a day (TID) | ORAL | Status: DC | PRN

## 2012-08-26 NOTE — Assessment & Plan Note (Signed)
BP Readings from Last 3 Encounters:  08/26/12 120/68  07/02/12 138/62  06/18/12 130/72   The current medical regimen is effective;  continue present plan and medications.

## 2012-08-26 NOTE — Assessment & Plan Note (Signed)
Long standing dx with neurologic complication (peripheral neuropathy) - follows with podiatry for same Also renal insuff Denies symptomatic hypoglycemia -  patient advised on anticipated increase in cbgs due to steroids/pred - pt will call if cbg>200  Lab Results  Component Value Date   HGBA1C 6.3 02/22/2012

## 2012-08-26 NOTE — Patient Instructions (Signed)
It was good to see you today. Take prednisone taper for next 6 days as discussed to treat arthritis flare symptoms, then continue prednisone 5mg  daily everyday Okay to use Norco tablet/hydrocodone half or whole tablet every 8 hours as needed for pain flares Your prescription(s) have been submitted to your pharmacy. Please take as directed and contact our office if you believe you are having problem(s) with the medication(s). Other medications reviewed and updated, no additional changes Test(s) ordered today. Your results will be released to MyChart (or called to you) after review, usually within 72hours after test completion. If any changes need to be made, you will be notified at that same time. Please schedule followup in 3-6 months for general review of chronic issues, call sooner if worse

## 2012-08-26 NOTE — Progress Notes (Signed)
  Subjective:    Patient ID: [name], male    DOB: 07-08-1920, 79 y.o.   MRN: [id number]  HPI  complains of arthritis flare -R>L wrist and shoulder Min improved with tylenol otc, not taking NSAIDs due to warfarin use Hx same - prev on daily pred prn - none since 10/2011 per wife Denies trauma or injury, no fever  Past Medical History  Diagnosis Date  . Presence of permanent cardiac pacemaker 04/2006 upgrade    Medtronic Adapta  . Atrial fibrillation   . HTN (hypertension)   . DM (diabetes mellitus)   . CAD (coronary artery disease)     DES to cx  . Pulmonary embolism 12/2003  . Deep vein thrombophlebitis of leg 2005, 2009    recurrent when off anticoag  . Nasal polyp   . Allergic rhinitis   . Asthma   . Renal insufficiency   . Tachy-brady syndrome     s/p PPM    Review of Systems  Constitutional: Positive for fatigue. Negative for fever.  Musculoskeletal: Positive for back pain, joint swelling and arthralgias. Negative for myalgias.  Neurological: Negative for weakness, light-headedness, numbness and headaches.       Objective:   Physical Exam  BP 120/68  Pulse 96  Temp(Src) 97.5 F (36.4 C) (Oral)  Wt 165 lb 12.8 oz (75.206 kg)  BMI 25.22 kg/m2  SpO2 94% Gen: NAD, nontoxic - wife at side MSkel: R>L wrist with warmth/inflammation and mild swelling without effusion - FROM with pain, ligamentous function intact. R Shoulder: Full range of motion. Neurovascularly intact distally. Good strength with stress of rotator cuff but causes pain. Positive impingement signs. 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

## 2012-08-27 NOTE — Telephone Encounter (Signed)
Ok to refill cough syrup and klonopin

## 2012-08-27 NOTE — Telephone Encounter (Signed)
Please advise if okay to refill medications. Thanks.  

## 2012-08-27 NOTE — Telephone Encounter (Signed)
Rx's have been called in per CY.

## 2012-09-05 ENCOUNTER — Ambulatory Visit (INDEPENDENT_AMBULATORY_CARE_PROVIDER_SITE_OTHER)
Admission: RE | Admit: 2012-09-05 | Discharge: 2012-09-05 | Disposition: A | Discharge status: Home / Self Care | Payer: Medicare Other | Source: Ambulatory Visit | Attending: Internal Medicine | Admitting: Internal Medicine

## 2012-09-05 ENCOUNTER — Ambulatory Visit (INDEPENDENT_AMBULATORY_CARE_PROVIDER_SITE_OTHER): Payer: Medicare Other | Admitting: Internal Medicine

## 2012-09-05 ENCOUNTER — Encounter: Payer: Self-pay | Admitting: Internal Medicine

## 2012-09-05 VITALS — BP 122/60 | HR 74 | Ht 68.0 in | Wt 169.0 lb

## 2012-09-05 DIAGNOSIS — J42 Unspecified chronic bronchitis: Secondary | ICD-10-CM

## 2012-09-05 DIAGNOSIS — J45909 Unspecified asthma, uncomplicated: Secondary | ICD-10-CM

## 2012-09-05 DIAGNOSIS — G4700 Insomnia, unspecified: Secondary | ICD-10-CM

## 2012-09-05 NOTE — Progress Notes (Signed)
Patient ID: [name], male    DOB: 03/11/1921, 79 y.o.   MRN: [id number]  HPI 02/03/11- 89-year-old male never smoker followed for asthma, rhinitis, complicated by AF/pacemaker,/Coumadin, history PE/DVT, DM, HBP, CAD, renal insufficiency...............[name] wife here Last here- October 21, 2009-note reviewed Chest x-ray 10/02/2009 had shown stable basilar scarring otherwise clear lungs with no evidence of fibrosis from his previous amiodarone. He says he feels well, needing to use his nebulizer machine at least once daily through the spring and now in the fall weather. With recent cool weather his ears feel stopped up, decreased hearing, blowing some yellow from nose and chest, throat feels congested. He does not feel tight or wheezy, or short of breath. Appetite is down. He denies fever, blood, swollen glands were swollen feet.  04/07/11-  79 year old male never smoker followed for asthma, rhinitis, complicated by AF/pacemaker,/Coumadin, history PE/DVT, DM, HBP, CAD, renal insufficiency...............[name] wife here He has had flu shot. He says his breathing and allergy problems are "fine", under good control. He complains of his chronic insomnia. He is going to bed at 9:00 but rarely sleepy before 11. His wife gets him up at 6:45 AM. He tried Silenor but it caused headache. Tried cough syrup for sedation but that did not help. Says clonazepam 0.5 mg at bedtime does not help his sleep initiation. This is his chronic pattern, without observation of movement or respiratory disturbance. He does not feel physically uncomfortable in bed.  05/03/2011 Acute OV  Complains of  wheezing,sob,productive cough(yellow and thick). Called in [place] 2 days ago.  No fever or body aches. Coughing is getting worse. Wheezing is worse in early am.  No hemoptysis or chest pain . No edema . Appetite is good.  Feels some better since starting Zpack.   08/10/11-  79 year old male never smoker followed for asthma, rhinitis,  complicated by AF/pacemaker,/Coumadin, history PE/DVT, DM, HBP, CAD, renal insufficiency...............[name] wife here Chest congestion,cough-productive-yellow in color for weeks now-starts and stops; slight wheeze. They say the pattern is not new or changed. Chronic variable productive cough may improve for a few days after antibitotics. No blood and not progressive. Using only a nebulizer machine- dropped off of inhalers. Herat rhythm well controlled on chronic amiodarone w/ pacemaker.   10/11/11- 79 year old male never smoker followed for asthma, rhinitis, complicated by AF/pacemaker,/Coumadin, history PE/DVT, DM, HBP, CAD, renal insufficiency...............[name] wife here She says he has not changed much in the last 2 months. Productive cough with yellow and white "bubbles" but no blood or chest pain. Denies fever or swelling. Nebulizer helps a little with Atrovent used 4 times daily. Advair did not help. He is off of chronic prednisone.  12/11/11 -78 year old male never smoker followed for asthma, rhinitis, complicated by AF/pacemaker,/Coumadin, history PE/DVT, DM, HBP, CAD, renal insufficiency...............[name] wife here Wife thinks his breathing is doing well. Very limited activity due to leg weakness. Gives out quickly with breathing and not sleeping (not due to lung condition) Has no primary care since doctor moved. Dropped off prednisone which did help, but dropped off Advair which did not help. Uses nebulizer 3 times per day.  CXR 6/4 /13 IMPRESSION: Mild chronic changes at the lung bases. Ankylosing  spondylitis.  Original Report Authenticated By: Larey Seat, M.D.   02/12/12- 79 year old male never smoker followed for asthma, rhinitis, complicated by AF/pacemaker,/Coumadin, history PE/DVT, DM, HBP, CAD, renal insufficiency...............[name] wife here Cough-productive-yellow in color(happens all the time) For the past 2 weeks he reports increased nasal congestion. Using saline nasal rinse. Cough  chronically productive of light yellow sputum. No blood, fever or chest pain. Remote history of nasal polyps. He continues prednisone 10 mg daily. COPD assessment test (CABG) 32/40  07/02/12- 79 year old male never smoker followed for asthma, rhinitis, complicated by AF/pacemaker,/Coumadin, history PE/DVT, DM, HBP, CAD, renal insufficiency...............[name] wife here FOLLOWS FOR: increased chest congestion-yellow in color and lots of wheezing per wife, who watches over him pretty well. Primary care gave prednisone for swelling in his hand 2 weeks ago. It did not seem to help his breathing. No acute event. Sputum yellow or white with no blood or at chronic productive cough "no energy". He stopped Advair, preferring his nebulizer machine. He asks refill lorazepam for sleep. We discussed this carefully.  09/05/12- 79 year old male never smoker followed for asthma, rhinitis, complicated by AF/pacemaker,/Coumadin, history PE/DVT, DM, HBP, CAD, renal insufficiency        wife here FOLLOWS FOR: denies any wheezing or SOB. Slight cough but saw PCP and was given RX-stopped cough. No allergy troubles at this time. Dr. Felicity Coyer gave prednisone for arthralgias, 5 mg daily. It has helped his mild chronic cough. Wife states he doesn't "spit" as much. He likes cough syrup and Ativan used for sleep. I discussed these sedating medications and his age. Carefully with them. They both say he does not get confused, over sleepy or unsteady from using these. Bedtime around 8 or 9 PM, up at 6 AM.  ROS-see HPI Constitutional:   No-   weight loss, night sweats, fevers, chills, fatigue, lassitude. HEENT:   No-  headaches, difficulty swallowing, tooth/dental problems, sore throat,       No-  sneezing, itching, ear ache, no-nasal congestion, no-post nasal drip,  CV:  No-   chest pain, orthopnea, PND, swelling in lower extremities, anasarca,dizziness, palpitations Resp:  +Chronic shortness of breath with exertion or at rest.               + productive cough, + non-productive cough,  No- coughing up of blood.              No- change in color of mucus. + little wheezing.   Skin: No-   rash or lesions. GI:  No-   heartburn, indigestion, abdominal pain, nausea, vomiting,  GU: MS:  No-   joint pain or swelling.   Neuro-     nothing unusual Psych:  No- change in mood or affect. No depression or anxiety.  No memory loss.  OBJ- Physical Exam General- Alert, Oriented, Affect-appropriate, seems sharp, Distress- none acute;  Quite elderly, w/ wheelchair Skin- rash-none, lesions- none, excoriation- none Lymphadenopathy- none Head- atraumatic            Eyes- Gross vision intact, PERRLA, + ectropion left eye, secretions clear            Ears- Hearing diminished            Nose- + bilateral nasal polyps , no-Septal dev, mucus,  erosion, perforation             Throat- Mallampati II , mucosa clear , drainage- none, tonsils- atrophic Neck- flexible , trachea midline, no stridor , thyroid nl, carotid no bruit Chest - symmetrical excursion , unlabored           Heart/CV- RRR with skips , no murmur , no gallop  , no rub, nl s1 s2                           -  JVD- none , edema- none, stasis changes- none, varices- none           Lung- +diminished breath sounds with trace unlabored wheeze , dullness-none, rub- none           Chest wall- L pacemaker Abd-  Br/ Gen/ Rectal- Not done, not indicated Extrem- cyanosis- none, clubbing, none, atrophy- none, strength- nl Neuro- grossly intact to observation

## 2012-09-05 NOTE — Patient Instructions (Addendum)
Order- CXR dx chronic bronchitis   Please call as needed

## 2012-09-06 NOTE — Progress Notes (Signed)
Quick Note:  Spoke with patient's wife-aware of results and will relay to patient; if any other questions or concerns please call our office. ______

## 2012-09-12 ENCOUNTER — Encounter: Payer: Self-pay | Admitting: Internal Medicine

## 2012-09-12 NOTE — Assessment & Plan Note (Signed)
Better control. Low dose maintenance prednisone given for arthralgias may be helping this. He is coughing less and less productively. Plan-chest x-ray. Safety discussion about cough syrup and sleep medicines.

## 2012-09-12 NOTE — Assessment & Plan Note (Signed)
Chronic insomnia. We discussed caution at his age but agreed to refill Ativan

## 2012-09-27 ENCOUNTER — Other Ambulatory Visit: Payer: Self-pay | Admitting: Internal Medicine

## 2012-09-27 NOTE — Telephone Encounter (Signed)
Ok to refill 

## 2012-09-27 NOTE — Telephone Encounter (Signed)
Please advise if okay to refill. Thanks.  

## 2012-10-21 ENCOUNTER — Other Ambulatory Visit: Payer: Self-pay | Admitting: *Deleted

## 2012-10-21 MED ORDER — FUROSEMIDE 40 MG PO TABS: 40.0000 mg | ORAL_TABLET | Freq: Every day | ORAL | Status: DC

## 2012-10-31 ENCOUNTER — Other Ambulatory Visit: Payer: Self-pay | Admitting: Internal Medicine

## 2012-10-31 NOTE — Telephone Encounter (Signed)
Ok to refill these this time

## 2012-10-31 NOTE — Telephone Encounter (Signed)
Please advise if okay to refill medications. Thanks.

## 2012-11-26 ENCOUNTER — Other Ambulatory Visit: Payer: Self-pay | Admitting: Internal Medicine

## 2012-12-02 ENCOUNTER — Other Ambulatory Visit: Payer: Self-pay | Admitting: Internal Medicine

## 2012-12-02 NOTE — Telephone Encounter (Signed)
Please advise if okay to refill. Thanks.  

## 2012-12-02 NOTE — Telephone Encounter (Signed)
Ok to refill 

## 2012-12-03 ENCOUNTER — Ambulatory Visit: Payer: Self-pay | Admitting: Cardiology

## 2012-12-03 ENCOUNTER — Telehealth: Payer: Self-pay | Admitting: *Deleted

## 2012-12-03 DIAGNOSIS — I2699 Other pulmonary embolism without acute cor pulmonale: Secondary | ICD-10-CM

## 2012-12-03 DIAGNOSIS — I82409 Acute embolism and thrombosis of unspecified deep veins of unspecified lower extremity: Secondary | ICD-10-CM

## 2012-12-03 DIAGNOSIS — I4891 Unspecified atrial fibrillation: Secondary | ICD-10-CM

## 2012-12-03 NOTE — Telephone Encounter (Signed)
Message copied by Biance Moncrief, Franchot Mimes on Tue Dec 03, 2012  1:22 PM ------      Message from: Duke Salvia      Created: Thu Nov 28, 2012  6:14 PM      Regarding: RE: Appointments and med compliance       It sounds like a good call  [facility] [place]      ----- Message -----         From: Raul Del, RN         Sent: 11/28/2012   4:25 PM           To: Duke Salvia, MD, Jefferey Pica, RN      Subject: Annell Greening: Appointments and med compliance                                  ----- Message -----         From: [name], MD         Sent: 11/27/2012   7:59 AM           To: Newt Lukes, MD, Duke Salvia, MD, #      Subject: RE: Appointments and med compliance                      I would support stopping anticoagulation.      ----- Message -----         From: Raul Del, RN         Sent: 11/27/2012   7:24 AM           To: [name], MD      Subject: FW: Appointments and med compliance                                  ----- Message -----         From: Newt Lukes, MD         Sent: 11/26/2012   5:54 PM           To: Duke Salvia, MD, Raul Del, RN      Subject: RE: Appointments and med compliance                      It seems to me like the potential benefit of anticoag for this 79yo is outweighed by the risks of unmonitored therapy and inconsistent compliance.            I am in favor of DC'ing warfarin.            I suppose Xarelto may be an option to consider, but if has he "given up on taking a lot of his medications" (as per statement from wife, this option is unlikely to provide any additional benefit.            I am open to other opinions            Thanks Sherrian Nunnelley            ----- Message -----         From: Raul Del, RN         Sent: 11/26/2012   4:28 PM           To: Newt Lukes, MD, Viviann Spare  Anabel Halon, MD, #      Subject: Appointments and med compliance                          Pt has Hx of PE, DVT and Afib. He is very  non-compliant when it comes to making and keeping his coumadin clinic appts. He was last seen 07/2012 by [place] in coumadin clinic. Telephoned pt today but was only able to speak with wife who states pt is 49yrs old, he has given up on taking a lot of medications. He does not want to come in to get INR checks done. She states he has been without his coumadin for over a month and has done fine. Pt's history and risk reviewed with spouse, she relayed message to pt and I could hear him in background raising his voice refusing to schedule appt. Please Advise.                    ------

## 2012-12-03 NOTE — Telephone Encounter (Signed)
Telephoned spouse again, relayed the information that the pt's doctors are in agreeance with him stopped his coumadin since he doesn't want to take med or come in for INR level checks. She informed pt while on phone and I heard him say good. She states that he just doesn't want to take coumadin and is ok with not taking it. Did inform her of risk but she states pt is ok and will not come in or take med. Thus, coumadin discontinued.

## 2012-12-05 ENCOUNTER — Telehealth: Payer: Self-pay | Admitting: Internal Medicine

## 2012-12-05 MED ORDER — CLONAZEPAM 0.5 MG PO TABS: ORAL_TABLET | ORAL | Status: DC

## 2012-12-05 MED ORDER — PROMETHAZINE-CODEINE 6.25-10 MG/5ML PO SYRP: ORAL_SOLUTION | ORAL | Status: DC

## 2012-12-05 NOTE — Telephone Encounter (Signed)
[name], MD at 12/02/2012 5:19 PM   Status: Signed            Ok to refill        Ronny Bacon, CMA at 12/02/2012 4:05 PM    Status: Signed             Please advise if okay to refill. Thanks.    Refills called in and pt is aware. Carron Curie, CMA

## 2012-12-31 ENCOUNTER — Encounter: Payer: Self-pay | Admitting: Internal Medicine

## 2013-01-06 ENCOUNTER — Other Ambulatory Visit: Payer: Self-pay | Admitting: Internal Medicine

## 2013-01-07 NOTE — Telephone Encounter (Signed)
Please advise if okay to refill. Thanks.  

## 2013-01-07 NOTE — Telephone Encounter (Signed)
Ok to refill 

## 2013-01-21 ENCOUNTER — Ambulatory Visit (INDEPENDENT_AMBULATORY_CARE_PROVIDER_SITE_OTHER): Payer: Medicare Other | Admitting: Internal Medicine

## 2013-01-21 ENCOUNTER — Encounter: Payer: Self-pay | Admitting: Internal Medicine

## 2013-01-21 VITALS — BP 120/78 | HR 86 | Ht 68.0 in | Wt 170.2 lb

## 2013-01-21 DIAGNOSIS — J45909 Unspecified asthma, uncomplicated: Secondary | ICD-10-CM

## 2013-01-21 DIAGNOSIS — G4700 Insomnia, unspecified: Secondary | ICD-10-CM

## 2013-01-21 DIAGNOSIS — Z23 Encounter for immunization: Secondary | ICD-10-CM

## 2013-01-21 NOTE — Patient Instructions (Addendum)
We will continue your current treatments- please call as needed  Flu vax

## 2013-01-21 NOTE — Progress Notes (Signed)
Patient ID: [name], male    DOB: 03/11/1921, 79 y.o.   MRN: [id number]  HPI 02/03/11- 89-year-old male never smoker followed for asthma, rhinitis, complicated by AF/pacemaker,/Coumadin, history PE/DVT, DM, HBP, CAD, renal insufficiency...............[name] wife here Last here- October 21, 2009-note reviewed Chest x-ray 10/02/2009 had shown stable basilar scarring otherwise clear lungs with no evidence of fibrosis from his previous amiodarone. He says he feels well, needing to use his nebulizer machine at least once daily through the spring and now in the fall weather. With recent cool weather his ears feel stopped up, decreased hearing, blowing some yellow from nose and chest, throat feels congested. He does not feel tight or wheezy, or short of breath. Appetite is down. He denies fever, blood, swollen glands were swollen feet.  04/07/11-  79 year old male never smoker followed for asthma, rhinitis, complicated by AF/pacemaker,/Coumadin, history PE/DVT, DM, HBP, CAD, renal insufficiency...............[name] wife here He has had flu shot. He says his breathing and allergy problems are "fine", under good control. He complains of his chronic insomnia. He is going to bed at 9:00 but rarely sleepy before 11. His wife gets him up at 6:45 AM. He tried Silenor but it caused headache. Tried cough syrup for sedation but that did not help. Says clonazepam 0.5 mg at bedtime does not help his sleep initiation. This is his chronic pattern, without observation of movement or respiratory disturbance. He does not feel physically uncomfortable in bed.  05/03/2011 Acute OV  Complains of  wheezing,sob,productive cough(yellow and thick). Called in [place] 2 days ago.  No fever or body aches. Coughing is getting worse. Wheezing is worse in early am.  No hemoptysis or chest pain . No edema . Appetite is good.  Feels some better since starting Zpack.   08/10/11-  79 year old male never smoker followed for asthma, rhinitis,  complicated by AF/pacemaker,/Coumadin, history PE/DVT, DM, HBP, CAD, renal insufficiency...............[name] wife here Chest congestion,cough-productive-yellow in color for weeks now-starts and stops; slight wheeze. They say the pattern is not new or changed. Chronic variable productive cough may improve for a few days after antibitotics. No blood and not progressive. Using only a nebulizer machine- dropped off of inhalers. Herat rhythm well controlled on chronic amiodarone w/ pacemaker.   10/11/11- 79 year old male never smoker followed for asthma, rhinitis, complicated by AF/pacemaker,/Coumadin, history PE/DVT, DM, HBP, CAD, renal insufficiency...............[name] wife here She says he has not changed much in the last 2 months. Productive cough with yellow and white "bubbles" but no blood or chest pain. Denies fever or swelling. Nebulizer helps a little with Atrovent used 4 times daily. Advair did not help. He is off of chronic prednisone.  12/11/11 -78 year old male never smoker followed for asthma, rhinitis, complicated by AF/pacemaker,/Coumadin, history PE/DVT, DM, HBP, CAD, renal insufficiency...............[name] wife here Wife thinks his breathing is doing well. Very limited activity due to leg weakness. Gives out quickly with breathing and not sleeping (not due to lung condition) Has no primary care since doctor moved. Dropped off prednisone which did help, but dropped off Advair which did not help. Uses nebulizer 3 times per day.  CXR 6/4 /13 IMPRESSION: Mild chronic changes at the lung bases. Ankylosing  spondylitis.  Original Report Authenticated By: Larey Seat, M.D.   02/12/12- 79 year old male never smoker followed for asthma, rhinitis, complicated by AF/pacemaker,/Coumadin, history PE/DVT, DM, HBP, CAD, renal insufficiency...............[name] wife here Cough-productive-yellow in color(happens all the time) For the past 2 weeks he reports increased nasal congestion. Using saline nasal rinse. Cough  chronically productive of light yellow sputum. No blood, fever or chest pain. Remote history of nasal polyps. He continues prednisone 10 mg daily. COPD assessment test (CABG) 32/40  07/02/12- 79 year old male never smoker followed for asthma, rhinitis, complicated by AF/pacemaker,/Coumadin, history PE/DVT, DM, HBP, CAD, renal insufficiency...............[name] wife here FOLLOWS FOR: increased chest congestion-yellow in color and lots of wheezing per wife, who watches over him pretty well. Primary care gave prednisone for swelling in his hand 2 weeks ago. It did not seem to help his breathing. No acute event. Sputum yellow or white with no blood or at chronic productive cough "no energy". He stopped Advair, preferring his nebulizer machine. He asks refill lorazepam for sleep. We discussed this carefully.  09/05/12- 79 year old male never smoker followed for asthma, rhinitis, complicated by AF/pacemaker,/Coumadin, history PE/DVT, DM, HBP, CAD, renal insufficiency        wife here FOLLOWS FOR: denies any wheezing or SOB. Slight cough but saw PCP and was given RX-stopped cough. No allergy troubles at this time. Dr. Felicity Coyer gave prednisone for arthralgias, 5 mg daily. It has helped his mild chronic cough. Wife states he doesn't "spit" as much. He likes cough syrup and Ativan used for sleep. I discussed these sedating medications and his age. Carefully with them. They both say he does not get confused, over sleepy or unsteady from using these. Bedtime around 8 or 9 PM, up at 6 AM.  01/21/13- 79 year old male never smoker followed for asthma, rhinitis, Insomnia, complicated by AF/pacemaker,/Coumadin, history PE/DVT, DM, HBP, CAD, renal insufficiency        wife here FOLLOWS FOR: Symptoms unchanged since last OV. He complains of feeling unusually weak today, nothing specific. They both say he has chronic cough with yellow sputum that comes right back after antibiotic. Remains on prednisone 5 mg daily and using  nebulizer directed. No change in his chronic insomnia pattern. CXR 09/05/12 IMPRESSION:  Minimal bibasilar atelectasis.  Borderline enlargement of cardiac silhouette post pacemaker.  Original Report Authenticated By: Ulyses Southward, M.D.  ROS-see HPI Constitutional:   No-   weight loss, night sweats, fevers, chills, fatigue, lassitude. HEENT:   No-  headaches, difficulty swallowing, tooth/dental problems, sore throat,       No-  sneezing, itching, ear ache, no-nasal congestion, no-post nasal drip,  CV:  No-   chest pain, orthopnea, PND, swelling in lower extremities, anasarca,dizziness, palpitations Resp:  +Chronic shortness of breath with exertion or at rest.              + productive cough, + non-productive cough,  No- coughing up of blood.              + change in color of mucus. + little wheezing.   Skin: No-   rash or lesions. GI:  No-   heartburn, indigestion, abdominal pain, nausea, vomiting,  GU: MS:  No-   joint pain or swelling.   Neuro-     nothing unusual Psych:  No- change in mood or affect. No depression or anxiety.  No memory loss.  OBJ- Physical Exam General- Alert, Oriented, Affect-appropriate, seems sharp, Distress- none acute;  Quite elderly, w/          wheelchair Skin- rash-none, lesions- none, excoriation- none Lymphadenopathy- none Head- atraumatic            Eyes- Gross vision intact, PERRLA, + ectropion left eye, secretions clear            Ears- Hearing diminished  Nose- + bilateral nasal polyps , no-Septal dev, mucus,  erosion, perforation             Throat- Mallampati II , mucosa clear , drainage- none, tonsils- atrophic Neck- flexible , trachea midline, no stridor , thyroid nl, carotid no bruit Chest - symmetrical excursion , unlabored           Heart/CV- RRR with skips , no murmur , no gallop  , no rub, nl s1 s2                           - JVD- none , edema- none, stasis changes- none, varices- none           Lung- +diminished breath sounds, +  fine rhonchi left posterior , dullness-none, rub- none           Chest wall- L pacemaker Abd-  Br/ Gen/ Rectal- Not done, not indicated Extrem- cyanosis- none, clubbing, none, atrophy- none, strength- nl Neuro- grossly intact to observation

## 2013-01-26 NOTE — Assessment & Plan Note (Signed)
Concern with any sedating medication at his age. We discussed this again. He and his wife both deny any problems at all

## 2013-01-26 NOTE — Assessment & Plan Note (Signed)
Chronic bronchitis pattern is not worse but never really clear,  except while on antibiotics Plan-flu vaccine

## 2013-01-27 ENCOUNTER — Other Ambulatory Visit: Payer: Self-pay

## 2013-01-27 MED ORDER — METOPROLOL SUCCINATE ER 25 MG PO TB24: 25.0000 mg | ORAL_TABLET | Freq: Every day | ORAL | Status: DC

## 2013-01-28 ENCOUNTER — Other Ambulatory Visit: Payer: Self-pay | Admitting: Internal Medicine

## 2013-01-29 NOTE — Telephone Encounter (Signed)
Please advise if okay to refill. Thanks.  

## 2013-01-29 NOTE — Telephone Encounter (Signed)
Ok to refill 

## 2013-01-31 ENCOUNTER — Other Ambulatory Visit: Payer: Self-pay

## 2013-01-31 MED ORDER — NITROGLYCERIN 0.3 MG SL SUBL: 0.3000 mg | SUBLINGUAL_TABLET | SUBLINGUAL | Status: DC | PRN

## 2013-02-04 ENCOUNTER — Telehealth: Payer: Self-pay | Admitting: Internal Medicine

## 2013-02-04 MED ORDER — CLONAZEPAM 0.5 MG PO TABS: 0.5000 mg | ORAL_TABLET | Freq: Every evening | ORAL | Status: DC | PRN

## 2013-02-04 NOTE — Telephone Encounter (Signed)
Pt is needing refill of the clonazepam.  Last filled 12/02/2012 with no refills.  CY please advise if ok to call to the pharmacy with refills.  Thanks  Last ov--01/21/2013 Next ov--07/21/2013  Allergies  Allergen Reactions  . Asa Buff (Mag [Buffered Aspirin]   . Ibuprofen     REACTION: causes nausea and vomitting

## 2013-02-04 NOTE — Telephone Encounter (Signed)
Per CY-okay to refill Clonazepam(we are aware that patient uses Lorazepam as well).

## 2013-02-04 NOTE — Telephone Encounter (Signed)
Spoke with patients spouse, made her aware Rx has been sent in for pts medicatioin. Verbalized understanding and nothing further needed at this time

## 2013-02-05 ENCOUNTER — Emergency Department (HOSPITAL_COMMUNITY): Payer: Medicare Other

## 2013-02-05 ENCOUNTER — Inpatient Hospital Stay (HOSPITAL_COMMUNITY)
Admission: EM | Admit: 2013-02-05 | Discharge: 2013-02-11 | DRG: 189 | Disposition: A | Discharge status: Home Health | Payer: Medicare Other | Attending: Internal Medicine | Admitting: Internal Medicine

## 2013-02-05 ENCOUNTER — Encounter (HOSPITAL_COMMUNITY): Payer: Self-pay | Admitting: Emergency Medicine

## 2013-02-05 DIAGNOSIS — I4891 Unspecified atrial fibrillation: Secondary | ICD-10-CM | POA: Diagnosis present

## 2013-02-05 DIAGNOSIS — I5033 Acute on chronic diastolic (congestive) heart failure: Secondary | ICD-10-CM | POA: Diagnosis not present

## 2013-02-05 DIAGNOSIS — I2510 Atherosclerotic heart disease of native coronary artery without angina pectoris: Secondary | ICD-10-CM | POA: Diagnosis present

## 2013-02-05 DIAGNOSIS — J209 Acute bronchitis, unspecified: Secondary | ICD-10-CM | POA: Diagnosis present

## 2013-02-05 DIAGNOSIS — I5032 Chronic diastolic (congestive) heart failure: Secondary | ICD-10-CM

## 2013-02-05 DIAGNOSIS — J441 Chronic obstructive pulmonary disease with (acute) exacerbation: Secondary | ICD-10-CM | POA: Diagnosis present

## 2013-02-05 DIAGNOSIS — T380X5A Adverse effect of glucocorticoids and synthetic analogues, initial encounter: Secondary | ICD-10-CM | POA: Diagnosis present

## 2013-02-05 DIAGNOSIS — Z9861 Coronary angioplasty status: Secondary | ICD-10-CM

## 2013-02-05 DIAGNOSIS — J9819 Other pulmonary collapse: Secondary | ICD-10-CM | POA: Diagnosis present

## 2013-02-05 DIAGNOSIS — Z79899 Other long term (current) drug therapy: Secondary | ICD-10-CM

## 2013-02-05 DIAGNOSIS — D72829 Elevated white blood cell count, unspecified: Secondary | ICD-10-CM | POA: Diagnosis present

## 2013-02-05 DIAGNOSIS — Z86718 Personal history of other venous thrombosis and embolism: Secondary | ICD-10-CM

## 2013-02-05 DIAGNOSIS — K219 Gastro-esophageal reflux disease without esophagitis: Secondary | ICD-10-CM | POA: Diagnosis present

## 2013-02-05 DIAGNOSIS — E785 Hyperlipidemia, unspecified: Secondary | ICD-10-CM | POA: Diagnosis present

## 2013-02-05 DIAGNOSIS — I10 Essential (primary) hypertension: Secondary | ICD-10-CM | POA: Diagnosis present

## 2013-02-05 DIAGNOSIS — R0902 Hypoxemia: Secondary | ICD-10-CM

## 2013-02-05 DIAGNOSIS — J440 Chronic obstructive pulmonary disease with acute lower respiratory infection: Secondary | ICD-10-CM | POA: Diagnosis present

## 2013-02-05 DIAGNOSIS — Z86711 Personal history of pulmonary embolism: Secondary | ICD-10-CM

## 2013-02-05 DIAGNOSIS — R109 Unspecified abdominal pain: Secondary | ICD-10-CM

## 2013-02-05 DIAGNOSIS — J9620 Acute and chronic respiratory failure, unspecified whether with hypoxia or hypercapnia: Principal | ICD-10-CM

## 2013-02-05 DIAGNOSIS — I129 Hypertensive chronic kidney disease with stage 1 through stage 4 chronic kidney disease, or unspecified chronic kidney disease: Secondary | ICD-10-CM | POA: Diagnosis present

## 2013-02-05 DIAGNOSIS — N183 Chronic kidney disease, stage 3 unspecified: Secondary | ICD-10-CM

## 2013-02-05 DIAGNOSIS — J811 Chronic pulmonary edema: Secondary | ICD-10-CM

## 2013-02-05 DIAGNOSIS — IMO0002 Reserved for concepts with insufficient information to code with codable children: Secondary | ICD-10-CM

## 2013-02-05 DIAGNOSIS — H109 Unspecified conjunctivitis: Secondary | ICD-10-CM | POA: Diagnosis present

## 2013-02-05 DIAGNOSIS — E119 Type 2 diabetes mellitus without complications: Secondary | ICD-10-CM | POA: Diagnosis present

## 2013-02-05 DIAGNOSIS — J45909 Unspecified asthma, uncomplicated: Secondary | ICD-10-CM

## 2013-02-05 DIAGNOSIS — G9340 Encephalopathy, unspecified: Secondary | ICD-10-CM | POA: Diagnosis present

## 2013-02-05 DIAGNOSIS — Z66 Do not resuscitate: Secondary | ICD-10-CM | POA: Diagnosis present

## 2013-02-05 DIAGNOSIS — Z950 Presence of cardiac pacemaker: Secondary | ICD-10-CM

## 2013-02-05 HISTORY — DX: Chronic kidney disease, stage 3 unspecified: N18.30

## 2013-02-05 HISTORY — DX: Paroxysmal atrial fibrillation: I48.0

## 2013-02-05 HISTORY — DX: Chronic kidney disease, stage 3 (moderate): N18.3

## 2013-02-05 LAB — URINALYSIS, ROUTINE W REFLEX MICROSCOPIC
Ketones, ur: NEGATIVE mg/dL
Leukocytes, UA: NEGATIVE
Nitrite: NEGATIVE
Protein, ur: 30 mg/dL — AB
Urobilinogen, UA: 1 mg/dL (ref 0.0–1.0)

## 2013-02-05 LAB — POCT I-STAT, CHEM 8
Calcium, Ion: 1.12 mmol/L — ABNORMAL LOW (ref 1.13–1.30)
Glucose, Bld: 198 mg/dL — ABNORMAL HIGH (ref 70–99)
HCT: 47 % (ref 39.0–52.0)
Hemoglobin: 16 g/dL (ref 13.0–17.0)
Sodium: 140 mEq/L (ref 135–145)
TCO2: 28 mmol/L (ref 0–100)

## 2013-02-05 LAB — COMPREHENSIVE METABOLIC PANEL
ALT: 9 U/L (ref 0–53)
Albumin: 3.6 g/dL (ref 3.5–5.2)
Alkaline Phosphatase: 85 U/L (ref 39–117)
CO2: 29 mEq/L (ref 19–32)
Calcium: 9.3 mg/dL (ref 8.4–10.5)
Chloride: 98 mEq/L (ref 96–112)
Creatinine, Ser: 1.44 mg/dL — ABNORMAL HIGH (ref 0.50–1.35)
GFR calc Af Amer: 48 mL/min — ABNORMAL LOW (ref >90)
GFR calc non Af Amer: 41 mL/min — ABNORMAL LOW (ref >90)
Glucose, Bld: 198 mg/dL — ABNORMAL HIGH (ref 70–99)
Potassium: 4.2 mEq/L (ref 3.5–5.1)
Sodium: 138 mEq/L (ref 135–145)
Total Protein: 6.8 g/dL (ref 6.0–8.3)

## 2013-02-05 LAB — PROTIME-INR
INR: 1.24 (ref 0.00–1.49)
Prothrombin Time: 15.3 seconds — ABNORMAL HIGH (ref 11.6–15.2)

## 2013-02-05 LAB — CBC WITH DIFFERENTIAL/PLATELET
Basophils Absolute: 0 10*3/uL (ref 0.0–0.1)
Eosinophils Absolute: 0.3 10*3/uL (ref 0.0–0.7)
Eosinophils Relative: 2 % (ref 0–5)
HCT: 45 % (ref 39.0–52.0)
Hemoglobin: 15.4 g/dL (ref 13.0–17.0)
Lymphocytes Relative: 12 % (ref 12–46)
Lymphs Abs: 2.1 10*3/uL (ref 0.7–4.0)
MCH: 32.2 pg (ref 26.0–34.0)
MCV: 93.9 fL (ref 78.0–100.0)
Monocytes Absolute: 1.4 10*3/uL — ABNORMAL HIGH (ref 0.1–1.0)
Neutrophils Relative %: 78 % — ABNORMAL HIGH (ref 43–77)
Platelets: 166 10*3/uL (ref 150–400)
RDW: 13.4 % (ref 11.5–15.5)
WBC: 17.1 10*3/uL — ABNORMAL HIGH (ref 4.0–10.5)

## 2013-02-05 LAB — CBC
HCT: 43.5 % (ref 39.0–52.0)
Hemoglobin: 14.3 g/dL (ref 13.0–17.0)
RBC: 4.63 MIL/uL (ref 4.22–5.81)
RDW: 13.5 % (ref 11.5–15.5)
WBC: 14.7 10*3/uL — ABNORMAL HIGH (ref 4.0–10.5)

## 2013-02-05 LAB — LIPID PANEL
Total CHOL/HDL Ratio: 3.9 RATIO
Triglycerides: 155 mg/dL — ABNORMAL HIGH (ref <150)

## 2013-02-05 LAB — PRO B NATRIURETIC PEPTIDE: Pro B Natriuretic peptide (BNP): 1337 pg/mL — ABNORMAL HIGH (ref 0–450)

## 2013-02-05 LAB — URINE MICROSCOPIC-ADD ON

## 2013-02-05 LAB — CREATININE, SERUM
Creatinine, Ser: 1.36 mg/dL — ABNORMAL HIGH (ref 0.50–1.35)
GFR calc non Af Amer: 44 mL/min — ABNORMAL LOW (ref >90)

## 2013-02-05 LAB — TROPONIN I
Troponin I: <0.3 ng/mL (ref <0.30)
Troponin I: <0.3 ng/mL (ref <0.30)

## 2013-02-05 LAB — HEMOGLOBIN A1C
Hgb A1c MFr Bld: 8.9 % — ABNORMAL HIGH (ref <5.7)
Mean Plasma Glucose: 209 mg/dL — ABNORMAL HIGH (ref <117)

## 2013-02-05 LAB — CG4 I-STAT (LACTIC ACID): Lactic Acid, Venous: 1.92 mmol/L (ref 0.5–2.2)

## 2013-02-05 LAB — TSH: TSH: 2.194 u[IU]/mL (ref 0.350–4.500)

## 2013-02-05 LAB — GLUCOSE, CAPILLARY

## 2013-02-05 MED ORDER — GLIPIZIDE 5 MG PO TABS
7.5000 mg | ORAL_TABLET | Freq: Every day | ORAL | Status: DC
  Administered 2013-02-05: 7.5 mg via ORAL
  Filled 2013-02-05 (×2): qty 1

## 2013-02-05 MED ORDER — ONDANSETRON HCL 4 MG/2ML IJ SOLN: 4.0000 mg | Freq: Four times a day (QID) | INTRAMUSCULAR | Status: DC | PRN

## 2013-02-05 MED ORDER — NITROGLYCERIN 0.3 MG SL SUBL: 0.3000 mg | SUBLINGUAL_TABLET | SUBLINGUAL | Status: DC | PRN

## 2013-02-05 MED ORDER — IPRATROPIUM BROMIDE 0.02 % IN SOLN
0.5000 mg | Freq: Four times a day (QID) | RESPIRATORY_TRACT | Status: DC
  Administered 2013-02-05 – 2013-02-06 (×6): 0.5 mg via RESPIRATORY_TRACT
  Filled 2013-02-05 (×6): qty 2.5

## 2013-02-05 MED ORDER — GUAIFENESIN ER 600 MG PO TB12
600.0000 mg | ORAL_TABLET | Freq: Two times a day (BID) | ORAL | Status: DC
  Administered 2013-02-05 – 2013-02-11 (×12): 600 mg via ORAL
  Filled 2013-02-05 (×13): qty 1

## 2013-02-05 MED ORDER — HEPARIN BOLUS VIA INFUSION
4000.0000 [IU] | Freq: Once | INTRAVENOUS | Status: AC
  Administered 2013-02-05: 4000 [IU] via INTRAVENOUS
  Filled 2013-02-05: qty 4000

## 2013-02-05 MED ORDER — SIMVASTATIN 20 MG PO TABS
20.0000 mg | ORAL_TABLET | Freq: Every day | ORAL | Status: DC
  Administered 2013-02-05: 20 mg via ORAL
  Filled 2013-02-05 (×2): qty 1

## 2013-02-05 MED ORDER — SODIUM CHLORIDE 0.9 % IJ SOLN
3.0000 mL | INTRAMUSCULAR | Status: DC | PRN
  Administered 2013-02-06: 09:00:00 via INTRAVENOUS

## 2013-02-05 MED ORDER — HEPARIN (PORCINE) IN NACL 100-0.45 UNIT/ML-% IJ SOLN
1100.0000 [IU]/h | INTRAMUSCULAR | Status: DC
  Administered 2013-02-05: 1100 [IU]/h via INTRAVENOUS
  Filled 2013-02-05: qty 250

## 2013-02-05 MED ORDER — SODIUM CHLORIDE 0.9 % IJ SOLN
3.0000 mL | Freq: Two times a day (BID) | INTRAMUSCULAR | Status: DC
  Administered 2013-02-05 – 2013-02-06 (×3): 3 mL via INTRAVENOUS
  Administered 2013-02-07: 11:00:00 via INTRAVENOUS
  Administered 2013-02-07 – 2013-02-11 (×7): 3 mL via INTRAVENOUS

## 2013-02-05 MED ORDER — LEVALBUTEROL HCL 0.63 MG/3ML IN NEBU
0.6300 mg | INHALATION_SOLUTION | Freq: Four times a day (QID) | RESPIRATORY_TRACT | Status: DC
  Administered 2013-02-05 – 2013-02-06 (×6): 0.63 mg via RESPIRATORY_TRACT
  Filled 2013-02-05 (×13): qty 3

## 2013-02-05 MED ORDER — DEXTROSE 5 % IV SOLN
1.0000 g | Freq: Once | INTRAVENOUS | Status: DC
  Administered 2013-02-05: 1 g via INTRAVENOUS
  Filled 2013-02-05: qty 10

## 2013-02-05 MED ORDER — INSULIN ASPART 100 UNIT/ML ~~LOC~~ SOLN
0.0000 [IU] | Freq: Every day | SUBCUTANEOUS | Status: DC
  Administered 2013-02-05: 2 [IU] via SUBCUTANEOUS

## 2013-02-05 MED ORDER — CLONAZEPAM 0.5 MG PO TABS
0.5000 mg | ORAL_TABLET | Freq: Every day | ORAL | Status: DC
  Administered 2013-02-05 – 2013-02-10 (×6): 0.5 mg via ORAL
  Filled 2013-02-05 (×6): qty 1

## 2013-02-05 MED ORDER — NITROGLYCERIN 0.4 MG SL SUBL: 0.4000 mg | SUBLINGUAL_TABLET | SUBLINGUAL | Status: DC | PRN

## 2013-02-05 MED ORDER — MORPHINE SULFATE 2 MG/ML IJ SOLN: 2.0000 mg | INTRAMUSCULAR | Status: DC | PRN

## 2013-02-05 MED ORDER — ACETAMINOPHEN 325 MG PO TABS: 650.0000 mg | ORAL_TABLET | Freq: Four times a day (QID) | ORAL | Status: DC | PRN

## 2013-02-05 MED ORDER — INSULIN ASPART 100 UNIT/ML ~~LOC~~ SOLN
0.0000 [IU] | Freq: Three times a day (TID) | SUBCUTANEOUS | Status: DC
  Administered 2013-02-05: 3 [IU] via SUBCUTANEOUS

## 2013-02-05 MED ORDER — HEPARIN SODIUM (PORCINE) 5000 UNIT/ML IJ SOLN: 5000.0000 [IU] | Freq: Three times a day (TID) | INTRAMUSCULAR | Status: DC

## 2013-02-05 MED ORDER — BENZONATATE 100 MG PO CAPS
200.0000 mg | ORAL_CAPSULE | Freq: Three times a day (TID) | ORAL | Status: DC
  Administered 2013-02-05 – 2013-02-11 (×16): 200 mg via ORAL
  Filled 2013-02-05 (×22): qty 2

## 2013-02-05 MED ORDER — DILTIAZEM HCL 25 MG/5ML IV SOLN
10.0000 mg | Freq: Once | INTRAVENOUS | Status: AC
  Administered 2013-02-05: 10 mg via INTRAVENOUS
  Filled 2013-02-05: qty 5

## 2013-02-05 MED ORDER — ACETAMINOPHEN 650 MG RE SUPP: 650.0000 mg | Freq: Four times a day (QID) | RECTAL | Status: DC | PRN

## 2013-02-05 MED ORDER — FUROSEMIDE 40 MG PO TABS
40.0000 mg | ORAL_TABLET | Freq: Every day | ORAL | Status: DC
  Administered 2013-02-05 – 2013-02-06 (×2): 40 mg via ORAL
  Filled 2013-02-05 (×4): qty 1

## 2013-02-05 MED ORDER — IOHEXOL 300 MG/ML  SOLN
25.0000 mL | INTRAMUSCULAR | Status: DC
Start: 2013-02-05 — End: 2013-02-05
  Administered 2013-02-05: 25 mL via ORAL

## 2013-02-05 MED ORDER — METHYLPREDNISOLONE SODIUM SUCC 125 MG IJ SOLR
60.0000 mg | Freq: Four times a day (QID) | INTRAMUSCULAR | Status: DC
  Administered 2013-02-05 – 2013-02-06 (×4): 60 mg via INTRAVENOUS
  Filled 2013-02-05 (×2): qty 2
  Filled 2013-02-05: qty 0.96
  Filled 2013-02-05: qty 2
  Filled 2013-02-05 (×3): qty 0.96

## 2013-02-05 MED ORDER — SODIUM CHLORIDE 0.9 % IV SOLN: 250.0000 mL | INTRAVENOUS | Status: DC | PRN

## 2013-02-05 MED ORDER — LEVOFLOXACIN IN D5W 750 MG/150ML IV SOLN
750.0000 mg | INTRAVENOUS | Status: DC
  Administered 2013-02-05 – 2013-02-07 (×2): 750 mg via INTRAVENOUS
  Filled 2013-02-05 (×2): qty 150

## 2013-02-05 MED ORDER — HYDROCODONE-ACETAMINOPHEN 5-325 MG PO TABS
1.0000 | ORAL_TABLET | ORAL | Status: DC | PRN
  Administered 2013-02-10: 1 via ORAL
  Filled 2013-02-05: qty 1

## 2013-02-05 MED ORDER — IOHEXOL 300 MG/ML  SOLN
80.0000 mL | Freq: Once | INTRAMUSCULAR | Status: AC | PRN
  Administered 2013-02-05: 70 mL via INTRAVENOUS

## 2013-02-05 MED ORDER — DILTIAZEM HCL 100 MG IV SOLR
5.0000 mg/h | INTRAVENOUS | Status: DC
  Administered 2013-02-05: 15 mg/h via INTRAVENOUS
  Administered 2013-02-05 – 2013-02-06 (×2): 5 mg/h via INTRAVENOUS
  Filled 2013-02-05 (×2): qty 100

## 2013-02-05 MED ORDER — ONDANSETRON HCL 4 MG PO TABS: 4.0000 mg | ORAL_TABLET | Freq: Four times a day (QID) | ORAL | Status: DC | PRN

## 2013-02-05 MED ORDER — PROMETHAZINE-CODEINE 6.25-10 MG/5ML PO SYRP
5.0000 mL | ORAL_SOLUTION | ORAL | Status: DC | PRN
  Filled 2013-02-05 (×3): qty 5

## 2013-02-05 MED ORDER — AZITHROMYCIN 250 MG PO TABS
500.0000 mg | ORAL_TABLET | Freq: Once | ORAL | Status: DC
  Administered 2013-02-05: 500 mg via ORAL
  Filled 2013-02-05: qty 2

## 2013-02-05 MED ORDER — METOPROLOL SUCCINATE ER 25 MG PO TB24
25.0000 mg | ORAL_TABLET | Freq: Every day | ORAL | Status: DC
  Administered 2013-02-05 – 2013-02-07 (×3): 25 mg via ORAL
  Filled 2013-02-05 (×4): qty 1

## 2013-02-05 MED ORDER — CIPROFLOXACIN HCL 0.3 % OP SOLN
2.0000 [drp] | OPHTHALMIC | Status: DC
  Administered 2013-02-05 – 2013-02-08 (×24): 2 [drp] via OPHTHALMIC
  Filled 2013-02-05 (×2): qty 2.5

## 2013-02-05 MED ORDER — LEVALBUTEROL HCL 0.63 MG/3ML IN NEBU
0.6300 mg | INHALATION_SOLUTION | RESPIRATORY_TRACT | Status: DC | PRN
  Administered 2013-02-05 – 2013-02-09 (×4): 0.63 mg via RESPIRATORY_TRACT
  Filled 2013-02-05 (×3): qty 3

## 2013-02-05 NOTE — ED Notes (Signed)
Patient transported to CT 

## 2013-02-05 NOTE — ED Notes (Signed)
Pulmonary/Critical Care doctors in rm to examine patient.

## 2013-02-05 NOTE — ED Notes (Signed)
Attempted to collect urine specimen but pt unable to void at this time.

## 2013-02-05 NOTE — ED Provider Notes (Signed)
7:30 AM  Assumed care from Dr. Blinda Leatherwood.  Patient is a 79 year old male with a history of chronic kidney disease, CAD, pacemaker who presents the emergency department with complaints of abdominal pain that started last night as well as SOB, cough. On exam, patient is mildly tachycardic but normotensive. He is a leukocytosis of 17.1 with left shift. Chest x-ray shows left lower lobe opacity, consistent with atelectasis. He is mildly short of breath but has no new oxygen requirement. BNP is also slightly elevated. CT abdomen pelvis and urinalysis pending.  9:53 AM  Upon reevaluation, patient is not tachycardic in the 150s, in atrial fibrillation. He is no longer on Coumadin. We'll give diltiazem and start diltiazem drip. Patient also has blood in his urine which may be do to catheterization he is still complaining of abdominal pain and shortness of breath. We'll treat with ceftriaxone and azithromycin to cover for possible UTI versus community-acquired pneumonia. Will discuss with hospitalist for admission.  PCP is at [facility].   Date: 02/05/2013 9:59 AM  Rate: 148  Rhythm: atrial fibrillation with RVR  QRS Axis: normal  Intervals: normal  ST/T Wave abnormalities: normal  Conduction Disutrbances: none  Narrative Interpretation: a fib with RVR; T wave inversions in anterior leads, right bundle branch block, with left anterior fascicular block      Layla Maw Ward, DO 02/05/13 1557

## 2013-02-05 NOTE — Consult Note (Signed)
CARDIOLOGY CONSULT NOTE  Patient ID: [name] MRN: [id number], DOB/AGE: [birth year]   Admit date: 02/05/2013 Date of Consult: 02/05/2013  Primary Physician: [name], MD Primary Cardiologist: Odessa Fleming, MD   Pt. Profile  79 y/o male with h/o PAF who presented to the ED today with a several day h/o progressive dyspnea and cough and has been found to be in afib with rvr.  Problem List  Past Medical History  Diagnosis Date  . Presence of permanent cardiac pacemaker 04/2006 upgrade    a. s/p MDT Adapta ADDR01 dual chamber PPM, ser #: [id number] H.  [name] PAF (paroxysmal atrial fibrillation)     a. previously on coumadin-->patient self-d/c'd 11/2012 b/c he was tired of having INR's checked.  . Tachy-brady syndrome   . DM (diabetes mellitus)   . CAD (coronary artery disease)     DES to cx  . Pulmonary embolism 12/2003  . Deep vein thrombophlebitis of leg 2005, 2009    recurrent when off anticoag  . HTN (hypertension)   . Allergic rhinitis   . Asthma   . CKD (chronic kidney disease), stage III   . Nasal polyp     Past Surgical History  Procedure Laterality Date  . Cataract/lens implants    . Nasal polyp surgery    . Coronary angioplasty with stent placement  2008    DES to cx  . Pacemaker insertion  04/2006    Medtronic Adapta    Allergies  Allergies  Allergen Reactions  . Asa Buff (Mag [Buffered Aspirin] Nausea Only  . Ibuprofen     REACTION: causes nausea and vomitting   HPI    79 year old male with prior history of paroxysmal atrial fibrillation and COPD. He has been treated with beta blocker therapy and also chronic Coumadin, however he discontinued Coumadin a prostate 2 months ago because he was tired of having to have his INR checks. He reports chronic fatigue and dyspnea and often times feels as though he doesn't have the energy to get to the office to have his INRs checked. Patient says that he has been in his usual state of health until this morning when  he awoke with left-sided abdominal discomfort and tenderness. Per internal medicine and pulmonology notes, who had the opportunity to speak to his wife, he's been having increasing coughing with change in sputum over the past few days. This constellation of symptoms prompted him to present to the ED today.  Here, he has been found to be in afib with RVR.  He is being admitted by internal medicine for COPD flare and we've been asked to eval r/t afib.  He denies chest pain or palpitations.  He has chronic dyspnea and cough, and he doesn't feel that these have changed recently.  Inpatient Medications  . benzonatate  200 mg Oral TID  . clonazePAM  0.5 mg Oral QHS  . furosemide  40 mg Oral Daily  . glipiZIDE  7.5 mg Oral Daily  . guaiFENesin  600 mg Oral BID  . heparin  5,000 Units Subcutaneous Q8H  . insulin aspart  0-5 Units Subcutaneous QHS  . insulin aspart  0-9 Units Subcutaneous TID WC  . ipratropium  0.5 mg Nebulization Q6H  . levalbuterol  0.63 mg Nebulization Q6H  . methylPREDNISolone (SOLU-MEDROL) injection  60 mg Intravenous Q6H  . metoprolol succinate  25 mg Oral Daily  . simvastatin  20 mg Oral q1800  . sodium chloride  3 mL Intravenous Q12H   Family  History Family History  Problem Relation Age of Onset  . Diabetes Neg Hx   . Hypertension Neg Hx   . Coronary artery disease Neg Hx     Social History History   Social History  . Marital Status: Married    Spouse Name: N/A    Number of Children: N/A  . Years of Education: 12   Occupational History  . Former Occupational hygienist   .     Social History Main Topics  . Smoking status: Never Smoker   . Smokeless tobacco: Not on file     Comment: married since 1952 to wife Thelma Barge, retrired Journalist, newspaper  . Alcohol Use: Not on file  . Drug Use: No  . Sexual Activity: Not on file   Other Topics Concern  . Not on file   Social History Narrative  . No narrative on file    Review of Systems  General:  No chills, fever, night sweats  or weight changes.  Cardiovascular:  No chest pain, +++ dyspnea on exertion, no edema, orthopnea, palpitations, paroxysmal nocturnal dyspnea. Dermatological: No rash, lesions/masses Respiratory: +++ cough with white to yellow sputum, +++ dyspnea Urologic: No hematuria, dysuria Abdominal:   No nausea, vomiting, diarrhea, bright red blood per rectum, melena, or hematemesis Neurologic:  No visual changes, wkns, changes in mental status. HEENT: red eyes with discharge over several mos.  Never treated. All other systems reviewed and are otherwise negative except as noted above.  Physical Exam  Blood pressure 129/73, pulse 133, temperature 98.2 F (36.8 C), temperature source Oral, resp. rate 22, SpO2 97.00%.  General: Pleasant, NAD Psych: Normal affect. Neuro: Alert and oriented X 3. Moves all extremities spontaneously. HEENT: Notable for injected conjunctivae with drainage bilat.  Neck: Supple without bruits.  Unable to assess neck veins 2/2 girth. Lungs:  Resp labored, insp/exp wheezing throughout with rhonchi bilat. Heart: IR IR, tachy, no s3, s4, or murmurs. Abdomen: protuberant, distended, nontender, BS + x 4.  Extremities: No clubbing, cyanosis or edema. DP/PT/Radials 2+ and equal bilaterally.  Labs   Recent Labs  02/05/13 0545 02/05/13 1130  TROPONINI <0.30 <0.30   Lab Results  Component Value Date   WBC 14.7* 02/05/2013   HGB 14.3 02/05/2013   HCT 43.5 02/05/2013   MCV 94.0 02/05/2013   PLT 143* 02/05/2013     Recent Labs Lab 02/05/13 0545 02/05/13 0548 02/05/13 1142  NA 138 140  --   K 4.2 4.1  --   CL 98 100  --   CO2 29  --   --   BUN 19 19  --   CREATININE 1.44* 1.60* 1.36*  CALCIUM 9.3  --   --   PROT 6.8  --   --   BILITOT 0.8  --   --   ALKPHOS 85  --   --   ALT 9  --   --   AST 15  --   --   GLUCOSE 198* 198*  --    Lab Results  Component Value Date   CHOL 173 02/05/2013   HDL 44 02/05/2013   LDLCALC 98 02/05/2013   TRIG 155* 02/05/2013    Radiology/Studies  Dg Chest 2 View  02/05/2013   CLINICAL DATA:  Shortness of breath. Left side abdominal pain.  EXAM: CHEST  2 VIEW  .  IMPRESSION: Mild opacity left lung base most compatible with atelectasis.   Electronically Signed   By: Drusilla Kanner M.D.   On: 02/05/2013 06:30  Ct Abdomen Pelvis W Contrast  02/05/2013   CLINICAL DATA:  Mid to left abdomen pain and left lower quadrant pain for 2 days  EXAM: CT ABDOMEN AND PELVIS WITH CONTRAST   IMPRESSION: 1. No explanation for the patient's left abdominal pain is seen. No renal or ureteral calculi are noted. 2. Single gallstone within the gallbladder. No gallbladder wall thickening. 3. Multiple rectosigmoid and distal descending colon diverticula. No diverticulitis. 4. The appendix and terminal ileum are unremarkable. 5. Cannot exclude sacroiliitis versus degenerative change of the SI joints.   Electronically Signed   By: Dwyane Dee M.D.   On: 02/05/2013 07:51   ECG  afib 148, no acute st/t changes.  ASSESSMENT AND PLAN  1.  Acute on chronic resp failure/COPD:  Inhalers and steroids per IM/Pulm.  2.  Afib RVR:  H/o PAF.  Asymptomatic.  Agree with dilt drip.  Will switch home bb to bisoprolol 5mg  bid in light of #1.  We will interrogate device to determine duration of afib and rate mgmt.  If we discover that he is having frequent or prolonged paroxysms with poor rate control, we will reconsider AA therapy.  He was prev on amio however this was d/c'd 01/2012 in the setting of recurrent PAF and concerns for possible contribution to lung dzs.  Pt stopped coumadin 2 mos ago 2/2 unwillingness/difficulty with getting to coumadin clinic 2/2 persistent fatigue.  He is not currently interested in considering a NOAC, but we will need to keep this in the back of our minds given his CHA2DS2VASc of 5.  Will add heparin for the time being.  3.  CAD: no chest pain.  Cont bb/statin.  4.  CKD III:  Stable.  Follow.  5.  Conjunctivitis:  Add opth  abx.  Signed, Nicolasa Ducking, NP 02/05/2013, 2:51 PM  Patient seen with NP, agree with the above note.  1. Asthma exacerbation: Patient never smoked but has long history of chronic asthma.  Diffuse wheezing on exam.  Asthma exacerbation may be driving the HR.  Treatment per primary team.  2. Atrial fibrillation with RVR: History of PAF.  Has not been on anticoagulation (was stopped earlier this year).  Amiodarone was stopped due to some concern for lung toxicity.  - Needs rate control: diltiazem gtt started.  Will stop metoprolol and instead use bisoprolol 5 mg bid (more beta-1 selective in the setting of lung disease).   - Controlling lung disease will likely help HR.  - Heparin gtt for now, will need to decide on +/- long-term anticoagulation.  He is not going to go back on coumadin but had been thinking about NOACs.  3. Abdominal pain: CT negative for acute pathology.  4. CAD: Stable, no chest pain.   Marca Ancona 02/05/2013 3:21 PM

## 2013-02-05 NOTE — H&P (Signed)
PCP:   [name], MD   Chief Complaint:  Cough/Abdominal pain.   HPI: This is a 79 year old male, with known history of Atrial fibrillation/Tachy-Brady syndrome, s/p PPM, Previously on Coumadin, but this was discontinued about 2 months ago. HTN, CAD, s/p PCI/DES to circumflex artery, previous DVT/PE, allergic rhinitis, bronchial asthma, COPD on chronic Prednisone therapy, dyslipidemia, DM-2. According to patient and his spouse, who were in the ED, patient has had a chronic productive cough, and over the last couple of days, this has become productive of yellow phlegm, without fever or chills, chest pain or increased SOB. He had left-sided abdominal pain through last night, and slept poorly, but had no vomiting or diarrhea. Patient is ambulant only with walker, and effort tolerance is poor. In the ED, patient was found to have temp 98.2, wcc 17.1, BP 140/78-137/92, and fast A.Fib with HR 108-149.   Allergies:   Allergies  Allergen Reactions  . Asa Buff (Mag [Buffered Aspirin] Nausea Only  . Ibuprofen     REACTION: causes nausea and vomitting      Past Medical History  Diagnosis Date  . Presence of permanent cardiac pacemaker 04/2006 upgrade    Medtronic Adapta  . Atrial fibrillation   . HTN (hypertension)   . DM (diabetes mellitus)   . CAD (coronary artery disease)     DES to cx  . Pulmonary embolism 12/2003  . Deep vein thrombophlebitis of leg 2005, 2009    recurrent when off anticoag  . Nasal polyp   . Allergic rhinitis   . Asthma   . Renal insufficiency   . Tachy-brady syndrome     s/p PPM    Past Surgical History  Procedure Laterality Date  . Cataract/lens implants    . Nasal polyp surgery    . Coronary angioplasty with stent placement  2008    DES to cx  . Pacemaker insertion  04/2006    Medtronic Adapta    Prior to Admission medications   Medication Sig Start Date End Date Taking? Authorizing Provider  budesonide (PULMICORT) 0.25 MG/2ML nebulizer solution  Use two times a day    Yes Historical Provider, MD  clonazePAM (KLONOPIN) 0.5 MG tablet Take 0.5 mg by mouth at bedtime.   Yes Historical Provider, MD  furosemide (LASIX) 40 MG tablet Take 1 tablet (40 mg total) by mouth daily. 10/21/12  Yes Duke Salvia, MD  glipiZIDE (GLUCOTROL) 5 MG tablet Take 7.5 mg by mouth daily.   Yes Historical Provider, MD  HYDROcodone-acetaminophen (NORCO/VICODIN) 5-325 MG per tablet Take 0.5-1 tablets by mouth every 8 (eight) hours as needed for pain. prn 08/26/12  Yes Newt Lukes, MD  ipratropium (ATROVENT) 0.02 % nebulizer solution Take 500 mcg by nebulization 4 (four) times daily.     Yes Historical Provider, MD  LORazepam (ATIVAN) 0.5 MG tablet Take 0.5-1 mg by mouth at bedtime as needed (sleep).   Yes Historical Provider, MD  lovastatin (MEVACOR) 40 MG tablet Take 40 mg by mouth at bedtime.   Yes Historical Provider, MD  metoprolol succinate (TOPROL-XL) 25 MG 24 hr tablet Take 1 tablet (25 mg total) by mouth daily. 01/27/13  Yes Duke Salvia, MD  Multiple Vitamins-Iron (QC DAILY MULTIVITAMINS/IRON) TABS Take 1 tablet by mouth daily.     Yes Historical Provider, MD  nitroGLYCERIN (NITROSTAT) 0.3 MG SL tablet Place 1 tablet (0.3 mg total) under the tongue every 5 (five) minutes as needed. 01/31/13  Yes Iran Ouch, MD  PredniSONE 5 MG TBEC Take 5 mg by mouth daily. 08/26/12  Yes Newt Lukes, MD  promethazine-codeine (PHENERGAN WITH CODEINE) 6.25-10 MG/5ML syrup Take 5 mLs by mouth every 4 (four) hours as needed for cough.   Yes Historical Provider, MD    Social History: Patient reports that he has never smoked. He does not have any smokeless tobacco history on file. He reports that he does not use illicit drugs. His alcohol history is not on file.  Family History  Problem Relation Age of Onset  . Diabetes Neg Hx   . Hypertension Neg Hx   . Coronary artery disease Neg Hx     Review of Systems:  As per HPI and chief complaint. Patient denies  fatigue, diminished appetite, weight loss, fever, chills, headache, blurred vision, difficulty in speaking, dysphagia, chest pain, orthopnea, paroxysmal nocturnal dyspnea, nausea, diaphoresis, vomiting, diarrhea, belching, heartburn, hematemesis, melena, dysuria, nocturia, urinary frequency, hematochezia, lower extremity swelling, pain, or redness. The rest of the systems review is negative.  Physical Exam:  General:  Patient is audibly wheezy, does not appear to be in obvious acute distress. Alert, communicative, fully oriented, talking in complete sentences, not short of breath at rest.  HEENT:  No clinical pallor, no jaundice, no conjunctival injection or discharge. NECK:  Supple, JVP not seen, no carotid bruits, no palpable lymphadenopathy, no palpable goiter. CHEST:  Bilateral polyphonic wheeze, no crackles. HEART:  Sounds 1 and 2 heard, normal, irregular, tachycardic, no murmurs. ABDOMEN:  Morbidly obses, soft, non-tender, no palpable organomegaly, no palpable masses, normal bowel sounds. GENITALIA:  Not examined. LOWER EXTREMITIES:  No pitting edema, palpable peripheral pulses. MUSCULOSKELETAL SYSTEM:  Generalized osteoarthritic changes, otherwise, normal. CENTRAL NERVOUS SYSTEM:  No focal neurologic deficit on gross examination.  Labs on Admission:  Results for orders placed during the hospital encounter of 02/05/13 (from the past 48 hour(s))  CBC WITH DIFFERENTIAL     Status: Abnormal   Collection Time    02/05/13  5:45 AM      Result Value Range   WBC 17.1 (*) 4.0 - 10.5 K/uL   RBC 4.79  4.22 - 5.81 MIL/uL   Hemoglobin 15.4  13.0 - 17.0 g/dL   HCT 16.1  09.6 - 04.5 %   MCV 93.9  78.0 - 100.0 fL   MCH 32.2  26.0 - 34.0 pg   MCHC 34.2  30.0 - 36.0 g/dL   RDW 40.9  81.1 - 91.4 %   Platelets 166  150 - 400 K/uL   Neutrophils Relative % 78 (*) 43 - 77 %   Neutro Abs 13.3 (*) 1.7 - 7.7 K/uL   Lymphocytes Relative 12  12 - 46 %   Lymphs Abs 2.1  0.7 - 4.0 K/uL   Monocytes  Relative 8  3 - 12 %   Monocytes Absolute 1.4 (*) 0.1 - 1.0 K/uL   Eosinophils Relative 2  0 - 5 %   Eosinophils Absolute 0.3  0.0 - 0.7 K/uL   Basophils Relative 0  0 - 1 %   Basophils Absolute 0.0  0.0 - 0.1 K/uL  COMPREHENSIVE METABOLIC PANEL     Status: Abnormal   Collection Time    02/05/13  5:45 AM      Result Value Range   Sodium 138  135 - 145 mEq/L   Potassium 4.2  3.5 - 5.1 mEq/L   Chloride 98  96 - 112 mEq/L   CO2 29  19 - 32 mEq/L  Glucose, Bld 198 (*) 70 - 99 mg/dL   BUN 19  6 - 23 mg/dL   Creatinine, Ser 1.19 (*) 0.50 - 1.35 mg/dL   Calcium 9.3  8.4 - 14.7 mg/dL   Total Protein 6.8  6.0 - 8.3 g/dL   Albumin 3.6  3.5 - 5.2 g/dL   AST 15  0 - 37 U/L   ALT 9  0 - 53 U/L   Alkaline Phosphatase 85  39 - 117 U/L   Total Bilirubin 0.8  0.3 - 1.2 mg/dL   GFR calc non Af Amer 41 (*) >90 mL/min   GFR calc Af Amer 48 (*) >90 mL/min   Comment: (NOTE)     The eGFR has been calculated using the CKD EPI equation.     This calculation has not been validated in all clinical situations.     eGFR's persistently <90 mL/min signify possible Chronic Kidney     Disease.  TROPONIN I     Status: None   Collection Time    02/05/13  5:45 AM      Result Value Range   Troponin I <0.30  <0.30 ng/mL   Comment:            Due to the release kinetics of cTnI,     a negative result within the first hours     of the onset of symptoms does not rule out     myocardial infarction with certainty.     If myocardial infarction is still suspected,     repeat the test at appropriate intervals.  PRO B NATRIURETIC PEPTIDE     Status: Abnormal   Collection Time    02/05/13  5:45 AM      Result Value Range   Pro B Natriuretic peptide (BNP) 1337.0 (*) 0 - 450 pg/mL  POCT I-STAT, CHEM 8     Status: Abnormal   Collection Time    02/05/13  5:48 AM      Result Value Range   Sodium 140  135 - 145 mEq/L   Potassium 4.1  3.5 - 5.1 mEq/L   Chloride 100  96 - 112 mEq/L   BUN 19  6 - 23 mg/dL    Creatinine, Ser 8.29 (*) 0.50 - 1.35 mg/dL   Glucose, Bld 562 (*) 70 - 99 mg/dL   Calcium, Ion 1.30 (*) 1.13 - 1.30 mmol/L   TCO2 28  0 - 100 mmol/L   Hemoglobin 16.0  13.0 - 17.0 g/dL   HCT 86.5  78.4 - 69.6 %  CG4 I-STAT (LACTIC ACID)     Status: None   Collection Time    02/05/13  5:49 AM      Result Value Range   Lactic Acid, Venous 1.92  0.5 - 2.2 mmol/L  URINALYSIS, ROUTINE W REFLEX MICROSCOPIC     Status: Abnormal   Collection Time    02/05/13  7:56 AM      Result Value Range   Color, Urine YELLOW  YELLOW   APPearance CLEAR  CLEAR   Specific Gravity, Urine 1.015  1.005 - 1.030   pH 5.5  5.0 - 8.0   Glucose, UA 100 (*) NEGATIVE mg/dL   Hgb urine dipstick MODERATE (*) NEGATIVE   Bilirubin Urine SMALL (*) NEGATIVE   Ketones, ur NEGATIVE  NEGATIVE mg/dL   Protein, ur 30 (*) NEGATIVE mg/dL   Urobilinogen, UA 1.0  0.0 - 1.0 mg/dL   Nitrite NEGATIVE  NEGATIVE   Leukocytes,  UA NEGATIVE  NEGATIVE  URINE MICROSCOPIC-ADD ON     Status: None   Collection Time    02/05/13  7:56 AM      Result Value Range   Squamous Epithelial / LPF RARE  RARE   WBC, UA 0-2  <3 WBC/hpf   RBC / HPF 7-10  <3 RBC/hpf   Bacteria, UA RARE  RARE   Urine-Other AMORPHOUS URATES/PHOSPHATES      Radiological Exams on Admission: Dg Chest 2 View  02/05/2013   CLINICAL DATA:  Shortness of breath. Left side abdominal pain.  EXAM: CHEST  2 VIEW  COMPARISON:  PA and lateral chest 09/05/2012.  FINDINGS: Mild streaky opacity in the left lung base is most consistent with atelectasis. The right lung is clear. Heart size is normal. No pneumothorax or pleural fluid. Pacing device noted.  IMPRESSION: Mild opacity left lung base most compatible with atelectasis.   Electronically Signed   By: Drusilla Kanner M.D.   On: 02/05/2013 06:30   Ct Abdomen Pelvis W Contrast  02/05/2013   CLINICAL DATA:  Mid to left abdomen pain and left lower quadrant pain for 2 days  EXAM: CT ABDOMEN AND PELVIS WITH CONTRAST  TECHNIQUE:  Multidetector CT imaging of the abdomen and pelvis was performed using the standard protocol following bolus administration of intravenous contrast.  CONTRAST:  70mL OMNIPAQUE IOHEXOL 300 MG/ML  SOLN  COMPARISON:  CT abdomen pelvis of 09/05/2008  FINDINGS: On lung window images there is minimal linear atelectasis at the left lung base. Cardiomegaly is stable. Pacer wire is noted. The liver enhances and is unchanged in configuration with no focal abnormality noted. At least 1 gallstone layers in the neck of the gallbladder and the gallbladder wall is not thickened. The pancreas is somewhat atrophic and the pancreatic duct is not dilated. The adrenal glands and spleen are unremarkable. The stomach is decompressed and difficult to evaluate. The kidneys enhance with no calculus or mass and on delayed images, the pelvocaliceal systems are unremarkable. The abdominal aorta is normal in caliber with moderate atheromatous change diffusely. Calcifications are present at the origins of the celiac axis and SMA as well as the renal arteries. No adenopathy is seen.  No abdominal wall hernia is seen. The urinary bladder is not well distended but no abnormality is seen. The prostate is within normal limits in size. No fluid is seen within the pelvis. Fat does into the inguinal canals. Multiple rectosigmoid colonic diverticular are present with diverticula also within the descending colon. However no mucosal edema of the colon is seen. The terminal ileum and the appendix are unremarkable. There are moderate degenerative changes in both hips. The SI joints appear somewhat sclerotic and sacroiliitis cannot be excluded. There are diffuse degenerative changes throughout the lumbar spine and facet joints with normal alignment maintained.  IMPRESSION: 1. No explanation for the patient's left abdominal pain is seen. No renal or ureteral calculi are noted. 2. Single gallstone within the gallbladder. No gallbladder wall thickening. 3.  Multiple rectosigmoid and distal descending colon diverticula. No diverticulitis. 4. The appendix and terminal ileum are unremarkable. 5. Cannot exclude sacroiliitis versus degenerative change of the SI joints.   Electronically Signed   By: Dwyane Dee M.D.   On: 02/05/2013 07:51    Assessment/Plan Active Problems:   1. COPD exacerbation: Patient presented with increased cough, productive of yellow phlegm, as well as elevated wcc. He was afebrile, and CXR as well as CT scan revealed left lung base atelectasis,  without evidence of consolidation. Chest ausculation revealed bilateral polyphonic wheeze, consistent with COPD exacerbation. Leukocytosis is likely due to steroid therapy. Certainly, patient does not look toxic. Blood cultures have been sent off, and unrinalysis reveals only mild microhematuria (post Foley). Will manage with bronchodilator nebulizers, steroids, mucolytics, Levaquin and Tessalon. Dr Jetty Duhamel is patient's primary pulmonologist, and will be informed of hospitalization.  2. Atrial fibrillation with RVR: Patient has chronic atrial fibrillation/Tachy-Brady syndrome, and is s/p PPM. He was found in the ED, to be in rapid atrial fibrillation, with HR up to 149/min. Started on ivi Cardizem in the ED, and we shall continue this. Pre-admission beta-blocker will be continued. We shall check TSH, monitor telemetrically, and cycle cardiac enzymes. Dr Aurelio Jew al, will be consulted. Coumadin was discontinued about 2 months ago. Although ProBNP is elevated at 1337, patient has no clinical features of CHF decompensation at this time. Perhaps, Plavix should be considered, as patient is allergic to ASA. Will defer to cardiology.  3. Abdominal pain: This reportedly commenced overnight, was left-sided, and unassociated with vomiting or diarrhea. CT Abdomen/Pelvis revealed no acute findings. Per patient, abdominal pain is exacerbated by cough, and physical exam revealed no tenderness.  4. Chronic  steroid use: Patient is on chronic steroid therapy, for COPD. He will be placed on parenteral stress doses, for now.  5. Diabetes mellitus: This is type 2, and appears sub-optimally controlled, based on random blood glucose of 191. Will check HBA1C and manage with Glipizide, as well as diet and SSI.   6. HTN (hypertension): Reasonably controlled at this time.  7. CAD (coronary artery disease): Known history of CAD, s/p PCI./DES to circumflex. Patient has no chest pain at this time and initial cardiac troponin is negative. Will complete cycling cardiac enzymes.  8. Dyslipidemia: On statin, which will be continued. 9. CKD: Patient has CKD-3. Baseline creatinine 2.0 in 08/2012. Renal function appears stable at this time. Will follow renal indices.    Further management will depend on clinical course.   Comment: Discussed patient's code status with spouse, in the ED. Per spouse, patient has a living will, and is DNR/DNI.   Time Spent on Admission: 1 Hour.   Ephraim Reichel,CHRISTOPHER 02/05/2013, 11:15 AM

## 2013-02-05 NOTE — Progress Notes (Signed)
ANTICOAGULATION CONSULT NOTE - Initial Consult  Pharmacy Consult for Heparin Indication: atrial fibrillation, hx PE/DVT  Allergies  Allergen Reactions  . Asa Buff (Mag [Buffered Aspirin] Nausea Only  . Ibuprofen     REACTION: causes nausea and vomitting    Patient Measurements:  Weight: 77.2kg Heparin Dosing Weight: 77.2kg  Vital Signs: Temp: 97.8 F (36.6 C) (09/24 1629) Temp src: Oral (09/24 1629) BP: 137/60 mmHg (09/24 1630) Pulse Rate: 132 (09/24 1630)  Labs:  Recent Labs  02/05/13 0545 02/05/13 0548 02/05/13 1130 02/05/13 1142  HGB 15.4 16.0  --  14.3  HCT 45.0 47.0  --  43.5  PLT 166  --   --  143*  CREATININE 1.44* 1.60*  --  1.36*  TROPONINI <0.30  --  <0.30  --     The CrCl is unknown because both a height and weight (above a minimum accepted value) are required for this calculation.   Medical History: Past Medical History  Diagnosis Date  . Presence of permanent cardiac pacemaker 04/2006 upgrade    a. s/p MDT Adapta ADDR01 dual chamber PPM, ser #: [id number] H.  [name] PAF (paroxysmal atrial fibrillation)     a. previously on coumadin-->patient self-d/c'd 11/2012 b/c he was tired of having INR's checked.  . Tachy-brady syndrome   . DM (diabetes mellitus)   . CAD (coronary artery disease)     DES to cx  . Pulmonary embolism 12/2003  . Deep vein thrombophlebitis of leg 2005, 2009    recurrent when off anticoag  . HTN (hypertension)   . Allergic rhinitis   . Asthma   . CKD (chronic kidney disease), stage III   . Nasal polyp     Medications:  Prescriptions prior to admission  Medication Sig Dispense Refill  . budesonide (PULMICORT) 0.25 MG/2ML nebulizer solution Use two times a day       . clonazePAM (KLONOPIN) 0.5 MG tablet Take 0.5 mg by mouth at bedtime.      . furosemide (LASIX) 40 MG tablet Take 1 tablet (40 mg total) by mouth daily.  90 tablet  1  . glipiZIDE (GLUCOTROL) 5 MG tablet Take 7.5 mg by mouth daily.      [name] HYDROcodone-acetaminophen  (NORCO/VICODIN) 5-325 MG per tablet Take 0.5-1 tablets by mouth every 8 (eight) hours as needed for pain. prn  30 tablet  5  . ipratropium (ATROVENT) 0.02 % nebulizer solution Take 500 mcg by nebulization 4 (four) times daily.        [name] LORazepam (ATIVAN) 0.5 MG tablet Take 0.5-1 mg by mouth at bedtime as needed (sleep).      . lovastatin (MEVACOR) 40 MG tablet Take 40 mg by mouth at bedtime.      . metoprolol succinate (TOPROL-XL) 25 MG 24 hr tablet Take 1 tablet (25 mg total) by mouth daily.  90 tablet  2  . Multiple Vitamins-Iron (QC DAILY MULTIVITAMINS/IRON) TABS Take 1 tablet by mouth daily.        . nitroGLYCERIN (NITROSTAT) 0.3 MG SL tablet Place 1 tablet (0.3 mg total) under the tongue every 5 (five) minutes as needed.  25 tablet  3  . PredniSONE 5 MG TBEC Take 5 mg by mouth daily.  30 tablet  5  . promethazine-codeine (PHENERGAN WITH CODEINE) 6.25-10 MG/5ML syrup Take 5 mLs by mouth every 4 (four) hours as needed for cough.        Assessment: 89yom to start heparin for Afib and hx PE/DVT. Patient previously took Coumadin  but self-stopped in 11/2012 due to office-visit burden - Cardiology MDs aware at that time.  - H/H wnl, Plts low normal - Heparin weigh: 77.2kg - No baseline INR - has been ordered - No significant bleeding reported - CrCl 30-35 ml/min   Goal of Therapy:  Heparin level 0.3-0.7 units/ml Monitor platelets by anticoagulation protocol: Yes   Plan:  1. Heparin IV bolus 4000 units x 1 2. Heparin drip 1100 units/hr (11 ml/hr) 3. Check heparin level 8 hours after initiation 4. Daily heparin level and CBC  Cleon Dew [phone] 02/05/2013,4:56 PM

## 2013-02-05 NOTE — ED Provider Notes (Signed)
CSN: [id number]     Arrival date & time 02/05/13  4098 History   First MD Initiated Contact with Patient 02/05/13 0530     Chief Complaint  Patient presents with  . Abdominal Pain   (Consider location/radiation/quality/duration/timing/severity/associated sxs/prior Treatment) HPI Comments: The patient presents to the ER for evaluation of abdominal pain. Patient reports onset of left-sided abdominal pain yesterday. Patient reports that the pain is moderate and continuous. He has not had any fever, nausea, vomiting, diarrhea or constipation. Denies chest pain. He does have a cough which is chronic and feels short of breath, but this is common for him. Patient does report that the pain worsens when he moves or coughs. It does not worsen when he presses on the abdomen.  Patient is a 79 y.o. male presenting with abdominal pain.  Abdominal Pain Associated symptoms: cough and shortness of breath     Past Medical History  Diagnosis Date  . Presence of permanent cardiac pacemaker 04/2006 upgrade    Medtronic Adapta  . Atrial fibrillation   . HTN (hypertension)   . DM (diabetes mellitus)   . CAD (coronary artery disease)     DES to cx  . Pulmonary embolism 12/2003  . Deep vein thrombophlebitis of leg 2005, 2009    recurrent when off anticoag  . Nasal polyp   . Allergic rhinitis   . Asthma   . Renal insufficiency   . Tachy-brady syndrome     s/p PPM   Past Surgical History  Procedure Laterality Date  . Cataract/lens implants    . Nasal polyp surgery    . Coronary angioplasty with stent placement  2008    DES to cx  . Pacemaker insertion  04/2006    Medtronic Adapta   Family History  Problem Relation Age of Onset  . Diabetes Neg Hx   . Hypertension Neg Hx   . Coronary artery disease Neg Hx    History  Substance Use Topics  . Smoking status: Never Smoker   . Smokeless tobacco: Not on file     Comment: married since 1952 to wife Thelma Barge, retrired Journalist, newspaper  . Alcohol Use:  Not on file    Review of Systems  Respiratory: Positive for cough and shortness of breath.   Gastrointestinal: Positive for abdominal pain.  All other systems reviewed and are negative.    Allergies  Asa buff (mag and Ibuprofen  Home Medications   Current Outpatient Rx  Name  Route  Sig  Dispense  Refill  . budesonide (PULMICORT) 0.25 MG/2ML nebulizer solution      Use two times a day          . clonazePAM (KLONOPIN) 0.5 MG tablet      TAKE 1 TABLET AT BEDTIME AS NEEDED   30 tablet   0   . clonazePAM (KLONOPIN) 0.5 MG tablet   Oral   Take 1 tablet (0.5 mg total) by mouth at bedtime as needed for anxiety.   30 tablet   0   . fluticasone (FLONASE) 50 MCG/ACT nasal spray      2 sprays each nostril every night at bedtime.   16 g   prn   . furosemide (LASIX) 40 MG tablet   Oral   Take 1 tablet (40 mg total) by mouth daily.   90 tablet   1   . glipiZIDE (GLUCOTROL) 5 MG tablet      TAKE 1.5 TABLETS (7.5 MG TOTAL) BY MOUTH DAILY BEFORE BREAKFAST.  90 tablet   3   . HYDROcodone-acetaminophen (NORCO/VICODIN) 5-325 MG per tablet   Oral   Take 0.5-1 tablets by mouth every 8 (eight) hours as needed for pain. prn   30 tablet   5   . ipratropium (ATROVENT) 0.02 % nebulizer solution   Nebulization   Take 500 mcg by nebulization 4 (four) times daily.           [name] LORazepam (ATIVAN) 0.5 MG tablet      TAKE 1 TO 2 TABLETS BY MOUTH AT BEDTIME AS NEEDED FOR SLEEP   60 tablet   4   . lovastatin (MEVACOR) 40 MG tablet      TAKE 1 TABLET (40 MG TOTAL) BY MOUTH AT BEDTIME.   90 tablet   2   . metoprolol succinate (TOPROL-XL) 25 MG 24 hr tablet   Oral   Take 1 tablet (25 mg total) by mouth daily.   90 tablet   2   . Multiple Vitamins-Iron (QC DAILY MULTIVITAMINS/IRON) TABS   Oral   Take 1 tablet by mouth daily.           . nitroGLYCERIN (NITROSTAT) 0.3 MG SL tablet   Sublingual   Place 1 tablet (0.3 mg total) under the tongue every 5 (five) minutes as  needed.   25 tablet   3   . PredniSONE 5 MG TBEC   Oral   Take 5 mg by mouth daily.   30 tablet   5   . promethazine-codeine (PHENERGAN WITH CODEINE) 6.25-10 MG/5ML syrup      TAKE 1 TEASPOONFUL BY MOUTH 4 TIMES A DAY AS NEEDED   200 mL   0    There were no vitals taken for this visit. Physical Exam  Constitutional: He is oriented to person, place, and time. He appears well-developed and well-nourished. No distress.  HENT:  Head: Normocephalic and atraumatic.  Right Ear: Hearing normal.  Left Ear: Hearing normal.  Nose: Nose normal.  Mouth/Throat: Oropharynx is clear and moist and mucous membranes are normal.  Eyes: Conjunctivae and EOM are normal. Pupils are equal, round, and reactive to light.  Neck: Normal range of motion. Neck supple.  Cardiovascular: Regular rhythm, S1 normal and S2 normal.  Exam reveals no gallop and no friction rub.   No murmur heard. Pulmonary/Chest: Effort normal and breath sounds normal. No respiratory distress. He exhibits no tenderness.  Abdominal: Soft. Normal appearance and bowel sounds are normal. There is no hepatosplenomegaly. There is no tenderness. There is no rebound, no guarding, no tenderness at McBurney's point and negative Murphy's sign. No hernia.  Musculoskeletal: Normal range of motion.  Neurological: He is alert and oriented to person, place, and time. He has normal strength. No cranial nerve deficit or sensory deficit. Coordination normal. GCS eye subscore is 4. GCS verbal subscore is 5. GCS motor subscore is 6.  Skin: Skin is warm, dry and intact. No rash noted. No cyanosis.  Psychiatric: He has a normal mood and affect. His speech is normal and behavior is normal. Thought content normal.    ED Course  Procedures (including critical care time)  EKG:  Date: 02/05/2013  Rate: 113  Rhythm: atrial fibrillation  QRS Axis: left  Intervals: normal  ST/T Wave abnormalities: nonspecific ST/T changes  Conduction Disutrbances:right  bundle branch block and left anterior fascicular block  Narrative Interpretation:   Old EKG Reviewed: unchanged    Labs Review Labs Reviewed  CBC WITH DIFFERENTIAL - Abnormal; Notable for the  following:    WBC 17.1 (*)    Neutrophils Relative % 78 (*)    Neutro Abs 13.3 (*)    Monocytes Absolute 1.4 (*)    All other components within normal limits  COMPREHENSIVE METABOLIC PANEL - Abnormal; Notable for the following:    Glucose, Bld 198 (*)    Creatinine, Ser 1.44 (*)    GFR calc non Af Amer 41 (*)    GFR calc Af Amer 48 (*)    All other components within normal limits  PRO B NATRIURETIC PEPTIDE - Abnormal; Notable for the following:    Pro B Natriuretic peptide (BNP) 1337.0 (*)    All other components within normal limits  URINALYSIS, ROUTINE W REFLEX MICROSCOPIC - Abnormal; Notable for the following:    Glucose, UA 100 (*)    Hgb urine dipstick MODERATE (*)    Bilirubin Urine SMALL (*)    Protein, ur 30 (*)    All other components within normal limits  HEMOGLOBIN A1C - Abnormal; Notable for the following:    Hemoglobin A1C 8.9 (*)    Mean Plasma Glucose 209 (*)    All other components within normal limits  LIPID PANEL - Abnormal; Notable for the following:    Triglycerides 155 (*)    All other components within normal limits  CBC - Abnormal; Notable for the following:    WBC 14.7 (*)    Platelets 143 (*)    All other components within normal limits  CREATININE, SERUM - Abnormal; Notable for the following:    Creatinine, Ser 1.36 (*)    GFR calc non Af Amer 44 (*)    GFR calc Af Amer 51 (*)    All other components within normal limits  PROTIME-INR - Abnormal; Notable for the following:    Prothrombin Time 15.3 (*)    All other components within normal limits  APTT - Abnormal; Notable for the following:    aPTT 173 (*)    All other components within normal limits  GLUCOSE, CAPILLARY - Abnormal; Notable for the following:    Glucose-Capillary 229 (*)    All other  components within normal limits  GLUCOSE, CAPILLARY - Abnormal; Notable for the following:    Glucose-Capillary 224 (*)    All other components within normal limits  POCT I-STAT, CHEM 8 - Abnormal; Notable for the following:    Creatinine, Ser 1.60 (*)    Glucose, Bld 198 (*)    Calcium, Ion 1.12 (*)    All other components within normal limits  MRSA PCR SCREENING  CULTURE, BLOOD (ROUTINE X 2)  CULTURE, BLOOD (ROUTINE X 2)  URINE CULTURE  TROPONIN I  URINE MICROSCOPIC-ADD ON  TROPONIN I  TROPONIN I  TSH  TROPONIN I  HEPARIN LEVEL (UNFRACTIONATED)  HEPARIN LEVEL (UNFRACTIONATED)  CBC  BASIC METABOLIC PANEL  CG4 I-STAT (LACTIC ACID)   Imaging Review No results found.  MDM  Diagnosis: Abdominal pain  He presents to the ER for evaluation of abdominal pain. Patient reports that he has been having constant pain in the left side of his lower abdomen. No associated symptoms. Abdominal exam revealed very slight tenderness in the region. Patient's workup reveals elevated BNP without overt pulmonary edema. CAT scan was ordered because of abdominal pain in an elderly patient. Case was signed out to Doctor Ward to followup CAT scan, disposition accordingly.    Gilda Crease, MD 02/05/13 [phone]

## 2013-02-05 NOTE — ED Notes (Signed)
Pt heart rate in 140-150's Dr. Elesa Massed in to see-- orders written. Wife remains at bedside.

## 2013-02-05 NOTE — Progress Notes (Signed)
ANTIBIOTIC CONSULT NOTE - INITIAL  Pharmacy Consult for Levaquin Indication: COPD exacerbation  Allergies  Allergen Reactions  . Asa Buff (Mag [Buffered Aspirin] Nausea Only  . Ibuprofen     REACTION: causes nausea and vomitting    Patient Measurements:   Ht: 68 in   Wt: 77 kg  Vital Signs: Temp: 98.2 F (36.8 C) (09/24 0531) Temp src: Oral (09/24 0531) BP: 116/77 mmHg (09/24 1143) Pulse Rate: 136 (09/24 1143) Intake/Output from previous day:   Intake/Output from this shift:    Labs:  Recent Labs  02/05/13 0545 02/05/13 0548  WBC 17.1*  --   HGB 15.4 16.0  PLT 166  --   CREATININE 1.44* 1.60*   The CrCl is unknown because both a height and weight (above a minimum accepted value) are required for this calculation. No results found for this basename: VANCOTROUGH, VANCOPEAK, VANCORANDOM, GENTTROUGH, GENTPEAK, GENTRANDOM, TOBRATROUGH, TOBRAPEAK, TOBRARND, AMIKACINPEAK, AMIKACINTROU, AMIKACIN,  in the last 72 hours   Microbiology: No results found for this or any previous visit (from the past 720 hour(s)).  Medical History: Past Medical History  Diagnosis Date  . Presence of permanent cardiac pacemaker 04/2006 upgrade    Medtronic Adapta  . Atrial fibrillation   . HTN (hypertension)   . DM (diabetes mellitus)   . CAD (coronary artery disease)     DES to cx  . Pulmonary embolism 12/2003  . Deep vein thrombophlebitis of leg 2005, 2009    recurrent when off anticoag  . Nasal polyp   . Allergic rhinitis   . Asthma   . Renal insufficiency   . Tachy-brady syndrome     s/p PPM    Medications:  See electronic med rec  Assessment: 79 y.o. male presents with cough/abd pain. To begin Levaquin for COPD exacerbation. SCr 1.6, est CrCl 30-35 ml/min.   Goal of Therapy:  Eradication of infection  Plan:  1. Levaquin 750mg  IV q24h. 2. Will f/u renal function and pt's clinical condition  Christoper Fabian, PharmD, BCPS Clinical pharmacist, pager [phone] 02/05/2013,1:21  PM  Admit Complaint: cough/abd pain  Anticoagulation: Pt on coumadin in past but discontinued about 2 mos ago.  Infectious Disease: Levaquin for COPD exacerbation  9/24 Levaquin>>  Cardiovascular: afib w/ RVR, tachy/brady syndrome, s/p PPM, HTN, CAD (s/p PCI), HLD. Chronic steroids for COPD PTA. Endocrinology: DM2 Gastrointestinal / Nutrition Neurology Nephrology Pulmonary: COPD Hematology / Oncology PTA Medication Issues Best Practices  Plan:  1. Levaquin 750mg  IV q24h. 2. Will f/u renal function and pt's clinical condition

## 2013-02-05 NOTE — ED Notes (Signed)
Wife will go home-- son to pick her up. Explained that it will be awhile for a bed, she requests a phone call when he goes upstairs. Pt thinks it is night time-- asking her to stay over night with him. Pt can answer some questions, but vague and confused with medical history.

## 2013-02-05 NOTE — ED Notes (Signed)
Per ems- pt from home. Pt reports abdominal pian that started last night. Denies nvd, constipation, urinary sx. Pt reports pain gets worse when he coughs, no tenderness upon palpation. Pt sounds wet with some slight wheezes (his norm per pt.) pt reports feeling chill and is cold to touch. Pt bp 140/70 hr 110. Pt with 22G L forearm. 12 lead unremarkable. Pt with cardiac hx of stents and pacemaker. Pt also reports weakness but no new weakness.

## 2013-02-05 NOTE — Consult Note (Signed)
PULMONARY  / CRITICAL CARE MEDICINE  Name: [name] MRN: [id number] DOB: 05-08-1921    ADMISSION DATE:  02/05/2013 CONSULTATION DATE:  02/05/2009  REFERRING MD :  Brien Few PRIMARY SERVICE:  [facility]  CHIEF COMPLAINT:  COPD Exacerbation   BRIEF PATIENT DESCRIPTION: 79 year old male, with known history of Atrial fibrillation/Tachy-Brady syndrome, s/p PPM, Previously on Coumadin, but this was discontinued about 2 months ago.allergic rhinitis, bronchial asthma, COPD on chronic Prednisone therapy (5 mg QD) admitted 9/24 with AECOPD complicated by A. Fib with RVR.   SIGNIFICANT EVENTS / STUDIES:  9/24: CT Scan negative   LINES / TUBES:   CULTURES: 9/24  Blood cultures>>>  ANTIBIOTICS: 9/24 Levaquin >>>> 9/24 Rocephin  (X1 )  HISTORY OF PRESENT ILLNESS:  Patient is a poor historian and arrived via EMS to ED 9/24 complaing of SOB and left upper quadrant abdominal pain. It has been reported by his wife that the patient has had a chronic productive cough for years, but over the last two days he began to produce yellow phlegm. Patient is ambulant only with walker, and effort tolerance is more poor.Deneis wearing oxygen at home. Patient denies fever or chills, chest pain, vomiting or diarrhea. He does report SOB and pain with deep inhalation that rates 6/10 located in left side and reported as sharp. Upon arrival to ED patient was  found to have temp 98.2, wcc 17.1, BP 140/78-137/92, and fast A.Fib with HR 108-149. CXR revealed a mild opacity in the left lung base most compatible with atelectasis. CT of abdomin and pelvis done 9/24 was unremarkable Blood cultures obtained. Initial Troponins negative. EKG reveals Atrial Fib with RVR. BNP measures 1337. Bun/Crit 19 & 1.60. Steriods, antibiotics, antidiuretic, and cardizem ordered. Currently on 2 L [place] in the ED.      PAST MEDICAL HISTORY :  Past Medical History  Diagnosis Date  . Presence of permanent cardiac pacemaker 04/2006 upgrade    Medtronic  Adapta  . Atrial fibrillation   . HTN (hypertension)   . DM (diabetes mellitus)   . CAD (coronary artery disease)     DES to cx  . Pulmonary embolism 12/2003  . Deep vein thrombophlebitis of leg 2005, 2009    recurrent when off anticoag  . Nasal polyp   . Allergic rhinitis   . Asthma   . Renal insufficiency   . Tachy-brady syndrome     s/p PPM   Past Surgical History  Procedure Laterality Date  . Cataract/lens implants    . Nasal polyp surgery    . Coronary angioplasty with stent placement  2008    DES to cx  . Pacemaker insertion  04/2006    Medtronic Adapta   Prior to Admission medications   Medication Sig Start Date End Date Taking? Authorizing Provider  budesonide (PULMICORT) 0.25 MG/2ML nebulizer solution Use two times a day    Yes Historical Provider, MD  clonazePAM (KLONOPIN) 0.5 MG tablet Take 0.5 mg by mouth at bedtime.   Yes Historical Provider, MD  furosemide (LASIX) 40 MG tablet Take 1 tablet (40 mg total) by mouth daily. 10/21/12  Yes Duke Salvia, MD  glipiZIDE (GLUCOTROL) 5 MG tablet Take 7.5 mg by mouth daily.   Yes Historical Provider, MD  HYDROcodone-acetaminophen (NORCO/VICODIN) 5-325 MG per tablet Take 0.5-1 tablets by mouth every 8 (eight) hours as needed for pain. prn 08/26/12  Yes Newt Lukes, MD  ipratropium (ATROVENT) 0.02 % nebulizer solution Take 500 mcg by nebulization 4 (  four) times daily.     Yes Historical Provider, MD  LORazepam (ATIVAN) 0.5 MG tablet Take 0.5-1 mg by mouth at bedtime as needed (sleep).   Yes Historical Provider, MD  lovastatin (MEVACOR) 40 MG tablet Take 40 mg by mouth at bedtime.   Yes Historical Provider, MD  metoprolol succinate (TOPROL-XL) 25 MG 24 hr tablet Take 1 tablet (25 mg total) by mouth daily. 01/27/13  Yes Duke Salvia, MD  Multiple Vitamins-Iron (QC DAILY MULTIVITAMINS/IRON) TABS Take 1 tablet by mouth daily.     Yes Historical Provider, MD  nitroGLYCERIN (NITROSTAT) 0.3 MG SL tablet Place 1 tablet (0.3 mg  total) under the tongue every 5 (five) minutes as needed. 01/31/13  Yes Iran Ouch, MD  PredniSONE 5 MG TBEC Take 5 mg by mouth daily. 08/26/12  Yes Newt Lukes, MD  promethazine-codeine (PHENERGAN WITH CODEINE) 6.25-10 MG/5ML syrup Take 5 mLs by mouth every 4 (four) hours as needed for cough.   Yes Historical Provider, MD   Allergies  Allergen Reactions  . Asa Buff (Mag [Buffered Aspirin] Nausea Only  . Ibuprofen     REACTION: causes nausea and vomitting    FAMILY HISTORY:  Family History  Problem Relation Age of Onset  . Diabetes Neg Hx   . Hypertension Neg Hx   . Coronary artery disease Neg Hx    SOCIAL HISTORY:  reports that he has never smoked. He does not have any smokeless tobacco history on file. He reports that he does not use illicit drugs. His alcohol history is not on file.  REVIEW OF SYSTEMS:   Constitutional: Positive malaise/fatigue. Negative for fever, chills, weight loss,  and diaphoresis.  HENT: Positive for hearing loss, Negative ear pain, nosebleeds, congestion, sore throat, neck pain, tinnitus and ear discharge.   Eyes: Negative for blurred vision, double vision, photophobia, pain, discharge and redness.  Respiratory: Positive for cough, sputum production, shortness of breath, wheezing.  Cardiovascular: Negative for chest pain, palpitations, orthopnea, claudication, leg swelling and PND.  Gastrointestinal: Negative for heartburn, nausea, vomiting, abdominal pain, diarrhea, constipation, blood in stool and melena.  Genitourinary: Negative for dysuria, urgency, frequency, hematuria and flank pain.  Musculoskeletal: Negative for myalgias, back pain, joint pain and falls.  Skin: Negative for itching and rash.  Neurological: Negative for dizziness, tingling, tremors, sensory change, speech change, focal weakness, seizures, loss of consciousness, weakness and headaches.  Endo/Heme/Allergies: Negative for environmental allergies and polydipsia. Does not  bruise/bleed easily.  SUBJECTIVE:  No acute distress  VITAL SIGNS: Temp:  [98.2 F (36.8 C)] 98.2 F (36.8 C) (09/24 0531) Pulse Rate:  [87-149] 136 (09/24 1143) Resp:  [15-28] 22 (09/24 1143) BP: (107-140)/(77-97) 116/77 mmHg (09/24 1143) SpO2:  [91 %-100 %] 97 % (09/24 1143)  PHYSICAL EXAMINATION: General: AOLx3, NAD, no focal def Cardiovascular: Irregular rate and rhythm, cool extremities, No JVD, >3 sec cap refill. No edema.  Lungs: Increased WOB with upper airway wheezing. Diminished breath sounds bilaterally.  Abdomen: Protuberant, soft, normoacvtive BS x 4.    Recent Labs Lab 02/05/13 0545 02/05/13 0548 02/05/13 1142  NA 138 140  --   K 4.2 4.1  --   CL 98 100  --   CO2 29  --   --   BUN 19 19  --   CREATININE 1.44* 1.60* 1.36*  GLUCOSE 198* 198*  --     Recent Labs Lab 02/05/13 0545 02/05/13 0548 02/05/13 1142  HGB 15.4 16.0 14.3  HCT 45.0 47.0 43.5  WBC 17.1*  --  14.7*  PLT 166  --  143*   Dg Chest 2 View  02/05/2013   CLINICAL DATA:  Shortness of breath. Left side abdominal pain.  EXAM: CHEST  2 VIEW  COMPARISON:  PA and lateral chest 09/05/2012.  FINDINGS: Mild streaky opacity in the left lung base is most consistent with atelectasis. The right lung is clear. Heart size is normal. No pneumothorax or pleural fluid. Pacing device noted.  IMPRESSION: Mild opacity left lung base most compatible with atelectasis.   Electronically Signed   By: Drusilla Kanner M.D.   On: 02/05/2013 06:30   Ct Abdomen Pelvis W Contrast  02/05/2013   CLINICAL DATA:  Mid to left abdomen pain and left lower quadrant pain for 2 days  EXAM: CT ABDOMEN AND PELVIS WITH CONTRAST  TECHNIQUE: Multidetector CT imaging of the abdomen and pelvis was performed using the standard protocol following bolus administration of intravenous contrast.  CONTRAST:  70mL OMNIPAQUE IOHEXOL 300 MG/ML  SOLN  COMPARISON:  CT abdomen pelvis of 09/05/2008  FINDINGS: On lung window images there is minimal linear  atelectasis at the left lung base. Cardiomegaly is stable. Pacer wire is noted. The liver enhances and is unchanged in configuration with no focal abnormality noted. At least 1 gallstone layers in the neck of the gallbladder and the gallbladder wall is not thickened. The pancreas is somewhat atrophic and the pancreatic duct is not dilated. The adrenal glands and spleen are unremarkable. The stomach is decompressed and difficult to evaluate. The kidneys enhance with no calculus or mass and on delayed images, the pelvocaliceal systems are unremarkable. The abdominal aorta is normal in caliber with moderate atheromatous change diffusely. Calcifications are present at the origins of the celiac axis and SMA as well as the renal arteries. No adenopathy is seen.  No abdominal wall hernia is seen. The urinary bladder is not well distended but no abnormality is seen. The prostate is within normal limits in size. No fluid is seen within the pelvis. Fat does into the inguinal canals. Multiple rectosigmoid colonic diverticular are present with diverticula also within the descending colon. However no mucosal edema of the colon is seen. The terminal ileum and the appendix are unremarkable. There are moderate degenerative changes in both hips. The SI joints appear somewhat sclerotic and sacroiliitis cannot be excluded. There are diffuse degenerative changes throughout the lumbar spine and facet joints with normal alignment maintained.  IMPRESSION: 1. No explanation for the patient's left abdominal pain is seen. No renal or ureteral calculi are noted. 2. Single gallstone within the gallbladder. No gallbladder wall thickening. 3. Multiple rectosigmoid and distal descending colon diverticula. No diverticulitis. 4. The appendix and terminal ileum are unremarkable. 5. Cannot exclude sacroiliitis versus degenerative change of the SI joints.   Electronically Signed   By: Dwyane Dee M.D.   On: 02/05/2013 07:51    ASSESSMENT / PLAN:   Acute on chronic dyspnea in the presence of acute COPD exacerbation complicated by atrial fibrillation with RVR.  Plan: Continue antibiotics. (5 days for AECOPD) Utilize xopenex in place of albuterol.  Taper systemic steroids.   Maximize GERD therapy.  Rate control of atrial fib to be deferred to cardiology.   All other issues: CKD, DM, abd pain, CAD Per primary   Admit to SDU for dilt drip for rate control, monitor for hypotension and bradycardia, solumedrol as ordered with a rapid taper, agree with abx, close monitor of vitals in SDU and will need  to address code status when family is available.  Alyson Reedy, M.D. Pulmonary and Critical Care Medicine [facility] Pager: [phone]  02/05/2013, 2:04 PM

## 2013-02-05 NOTE — ED Notes (Signed)
Pt and wife state that he is NOT to drink out of a straw.

## 2013-02-06 ENCOUNTER — Inpatient Hospital Stay (HOSPITAL_COMMUNITY): Payer: Medicare Other

## 2013-02-06 DIAGNOSIS — IMO0002 Reserved for concepts with insufficient information to code with codable children: Secondary | ICD-10-CM

## 2013-02-06 DIAGNOSIS — J209 Acute bronchitis, unspecified: Secondary | ICD-10-CM

## 2013-02-06 DIAGNOSIS — I2510 Atherosclerotic heart disease of native coronary artery without angina pectoris: Secondary | ICD-10-CM

## 2013-02-06 DIAGNOSIS — J45909 Unspecified asthma, uncomplicated: Secondary | ICD-10-CM

## 2013-02-06 LAB — BLOOD GAS, ARTERIAL
Acid-base deficit: 1.9 mmol/L (ref 0.0–2.0)
Drawn by: 34767
FIO2: 1 %
Patient temperature: 98.6
TCO2: 23.8 mmol/L (ref 0–100)
pCO2 arterial: 40.1 mmHg (ref 35.0–45.0)
pH, Arterial: 7.37 (ref 7.350–7.450)

## 2013-02-06 LAB — CBC
HCT: 41.6 % (ref 39.0–52.0)
MCH: 31.7 pg (ref 26.0–34.0)
MCHC: 34.6 g/dL (ref 30.0–36.0)
MCV: 91.6 fL (ref 78.0–100.0)
RDW: 13.6 % (ref 11.5–15.5)
WBC: 20.5 10*3/uL — ABNORMAL HIGH (ref 4.0–10.5)

## 2013-02-06 LAB — PRO B NATRIURETIC PEPTIDE: Pro B Natriuretic peptide (BNP): 3722 pg/mL — ABNORMAL HIGH (ref 0–450)

## 2013-02-06 LAB — URINE CULTURE
Colony Count: NO GROWTH
Culture: NO GROWTH

## 2013-02-06 LAB — BASIC METABOLIC PANEL
BUN: 24 mg/dL — ABNORMAL HIGH (ref 6–23)
Calcium: 8.1 mg/dL — ABNORMAL LOW (ref 8.4–10.5)
Chloride: 95 mEq/L — ABNORMAL LOW (ref 96–112)
GFR calc Af Amer: 40 mL/min — ABNORMAL LOW (ref >90)
Glucose, Bld: 345 mg/dL — ABNORMAL HIGH (ref 70–99)
Potassium: 4.5 mEq/L (ref 3.5–5.1)
Sodium: 133 mEq/L — ABNORMAL LOW (ref 135–145)

## 2013-02-06 LAB — GLUCOSE, CAPILLARY: Glucose-Capillary: 161 mg/dL — ABNORMAL HIGH (ref 70–99)

## 2013-02-06 LAB — HEPARIN LEVEL (UNFRACTIONATED): Heparin Unfractionated: 0.92 IU/mL — ABNORMAL HIGH (ref 0.30–0.70)

## 2013-02-06 MED ORDER — ATORVASTATIN CALCIUM 10 MG PO TABS
10.0000 mg | ORAL_TABLET | Freq: Every day | ORAL | Status: DC
  Administered 2013-02-06 – 2013-02-10 (×5): 10 mg via ORAL
  Filled 2013-02-06 (×6): qty 1

## 2013-02-06 MED ORDER — GLIPIZIDE 5 MG PO TABS
7.5000 mg | ORAL_TABLET | Freq: Every day | ORAL | Status: DC
  Administered 2013-02-07 – 2013-02-10 (×4): 7.5 mg via ORAL
  Filled 2013-02-06 (×5): qty 1

## 2013-02-06 MED ORDER — HEPARIN (PORCINE) IN NACL 100-0.45 UNIT/ML-% IJ SOLN
950.0000 [IU]/h | INTRAMUSCULAR | Status: DC
  Administered 2013-02-06: 950 [IU]/h via INTRAVENOUS
  Filled 2013-02-06: qty 250

## 2013-02-06 MED ORDER — DILTIAZEM HCL 60 MG PO TABS
60.0000 mg | ORAL_TABLET | Freq: Three times a day (TID) | ORAL | Status: DC
  Administered 2013-02-06 – 2013-02-07 (×4): 60 mg via ORAL
  Filled 2013-02-06 (×7): qty 1

## 2013-02-06 MED ORDER — INSULIN GLARGINE 100 UNIT/ML ~~LOC~~ SOLN
15.0000 [IU] | Freq: Every day | SUBCUTANEOUS | Status: DC
  Administered 2013-02-06 – 2013-02-07 (×2): 15 [IU] via SUBCUTANEOUS
  Filled 2013-02-06 (×3): qty 0.15

## 2013-02-06 MED ORDER — INSULIN ASPART 100 UNIT/ML ~~LOC~~ SOLN
0.0000 [IU] | Freq: Every day | SUBCUTANEOUS | Status: DC
  Administered 2013-02-06 – 2013-02-07 (×2): 3 [IU] via SUBCUTANEOUS
  Administered 2013-02-08: 5 [IU] via SUBCUTANEOUS
  Administered 2013-02-09 – 2013-02-10 (×2): 3 [IU] via SUBCUTANEOUS

## 2013-02-06 MED ORDER — LEVALBUTEROL HCL 0.63 MG/3ML IN NEBU
0.6300 mg | INHALATION_SOLUTION | Freq: Four times a day (QID) | RESPIRATORY_TRACT | Status: DC
  Administered 2013-02-07 – 2013-02-11 (×17): 0.63 mg via RESPIRATORY_TRACT
  Filled 2013-02-06 (×34): qty 3

## 2013-02-06 MED ORDER — APIXABAN 2.5 MG PO TABS
2.5000 mg | ORAL_TABLET | Freq: Two times a day (BID) | ORAL | Status: DC
  Administered 2013-02-06 – 2013-02-07 (×3): 2.5 mg via ORAL
  Filled 2013-02-06 (×4): qty 1

## 2013-02-06 MED ORDER — IPRATROPIUM BROMIDE 0.02 % IN SOLN
0.5000 mg | Freq: Four times a day (QID) | RESPIRATORY_TRACT | Status: DC
  Administered 2013-02-07 – 2013-02-11 (×16): 0.5 mg via RESPIRATORY_TRACT
  Filled 2013-02-06 (×19): qty 2.5

## 2013-02-06 MED ORDER — METHYLPREDNISOLONE SODIUM SUCC 125 MG IJ SOLR
60.0000 mg | Freq: Two times a day (BID) | INTRAMUSCULAR | Status: DC
  Administered 2013-02-07 – 2013-02-08 (×3): 60 mg via INTRAVENOUS
  Filled 2013-02-06 (×4): qty 0.96

## 2013-02-06 MED ORDER — INSULIN ASPART 100 UNIT/ML ~~LOC~~ SOLN
0.0000 [IU] | Freq: Three times a day (TID) | SUBCUTANEOUS | Status: DC
  Administered 2013-02-06: 15 [IU] via SUBCUTANEOUS
  Administered 2013-02-06: 3 [IU] via SUBCUTANEOUS
  Administered 2013-02-06: 15 [IU] via SUBCUTANEOUS
  Administered 2013-02-07: 11 [IU] via SUBCUTANEOUS
  Administered 2013-02-07 – 2013-02-08 (×4): 5 [IU] via SUBCUTANEOUS
  Administered 2013-02-08: 15 [IU] via SUBCUTANEOUS
  Administered 2013-02-09 (×2): 8 [IU] via SUBCUTANEOUS
  Administered 2013-02-09: 5 [IU] via SUBCUTANEOUS
  Administered 2013-02-10: 8 [IU] via SUBCUTANEOUS
  Administered 2013-02-10 (×2): 11 [IU] via SUBCUTANEOUS
  Administered 2013-02-11: 3 [IU] via SUBCUTANEOUS
  Administered 2013-02-11: 5 [IU] via SUBCUTANEOUS

## 2013-02-06 NOTE — Progress Notes (Signed)
ANTICOAGULATION CONSULT NOTE Pharmacy Consult for Heparin Indication: atrial fibrillation, hx PE/DVT  Allergies  Allergen Reactions  . Asa Buff (Mag [Buffered Aspirin] Nausea Only  . Ibuprofen     REACTION: causes nausea and vomitting    Patient Measurements:  Weight: 77.2kg Heparin Dosing Weight: 77.2kg  Vital Signs: Temp: 98.1 F (36.7 C) (09/25 0100) Temp src: Oral (09/25 0100) BP: 126/49 mmHg (09/25 0200) Pulse Rate: 69 (09/25 0228)  Labs:  Recent Labs  02/05/13 0545 02/05/13 0548 02/05/13 1130 02/05/13 1142 02/05/13 1648 02/05/13 1847 02/06/13 0123  HGB 15.4 16.0  --  14.3  --   --   --   HCT 45.0 47.0  --  43.5  --   --   --   PLT 166  --   --  143*  --   --   --   APTT  --   --   --   --   --  173*  --   LABPROT  --   --   --   --   --  15.3*  --   INR  --   --   --   --   --  1.24  --   HEPARINUNFRC  --   --   --   --   --   --  0.92*  CREATININE 1.44* 1.60*  --  1.36*  --   --  1.66*  TROPONINI <0.30  --  <0.30  --  <0.30  --   --     The CrCl is unknown because both a height and weight (above a minimum accepted value) are required for this calculation.  Assessment: 79 yo male with Afib for heparin   Goal of Therapy:  Heparin level 0.3-0.7 units/ml Monitor platelets by anticoagulation protocol: Yes   Plan:  Hold heparin x 1 hour, then decrease 950 units/hr Check heparin level in 8 hours.  Eddie Candle 02/06/2013,2:34 AM

## 2013-02-06 NOTE — Progress Notes (Signed)
PULMONARY  / CRITICAL CARE MEDICINE  Name: [name] MRN: [id number] DOB: 11/21/1920    ADMISSION DATE:  02/05/2013 CONSULTATION DATE:  02/05/2009  REFERRING MD :  Brien Few PRIMARY SERVICE:  [facility]  CHIEF COMPLAINT:  COPD Exacerbation   BRIEF PATIENT DESCRIPTION: 79 year old male, with known history of Atrial fibrillation/Tachy-Brady syndrome, s/p PPM, Previously on Coumadin, but this was discontinued about 2 months ago.allergic rhinitis, bronchial asthma, COPD on chronic Prednisone therapy (5 mg QD) admitted 9/24 with AECOPD complicated by A. Fib with RVR.   SIGNIFICANT EVENTS / STUDIES:  9/24: CT Scan negative   LINES / TUBES:   CULTURES: 9/24  Blood cultures>>>  ANTIBIOTICS: 9/24 Levaquin >>>> 9/24 Rocephin  (X1 )  HISTORY OF PRESENT ILLNESS:  Patient is a poor historian and arrived via EMS to ED 9/24 complaing of SOB and left upper quadrant abdominal pain. It has been reported by his wife that the patient has had a chronic productive cough for years, but over the last two days he began to produce yellow phlegm. Patient is ambulant only with walker, and effort tolerance is more poor.Deneis wearing oxygen at home. Patient denies fever or chills, chest pain, vomiting or diarrhea. He does report SOB and pain with deep inhalation that rates 6/10 located in left side and reported as sharp. Upon arrival to ED patient was  found to have temp 98.2, wcc 17.1, BP 140/78-137/92, and fast A.Fib with HR 108-149. CXR revealed a mild opacity in the left lung base most compatible with atelectasis. CT of abdomin and pelvis done 9/24 was unremarkable Blood cultures obtained. Initial Troponins negative. EKG reveals Atrial Fib with RVR. BNP measures 1337. Bun/Crit 19 & 1.60. Steriods, antibiotics, antidiuretic, and cardizem ordered. Currently on 2 L Andrews in the ED.      PAST MEDICAL HISTORY :  Past Medical History  Diagnosis Date  . Presence of permanent cardiac pacemaker 04/2006 upgrade    a. s/p MDT  Adapta ADDR01 dual chamber PPM, ser #: [id number] H.  [name] PAF (paroxysmal atrial fibrillation)     a. previously on coumadin-->patient self-d/c'd 11/2012 b/c he was tired of having INR's checked.  . Tachy-brady syndrome   . DM (diabetes mellitus)   . CAD (coronary artery disease)     DES to cx  . Pulmonary embolism 12/2003  . Deep vein thrombophlebitis of leg 2005, 2009    recurrent when off anticoag  . HTN (hypertension)   . Allergic rhinitis   . Asthma   . CKD (chronic kidney disease), stage III   . Nasal polyp    Past Surgical History  Procedure Laterality Date  . Cataract/lens implants    . Nasal polyp surgery    . Coronary angioplasty with stent placement  2008    DES to cx  . Pacemaker insertion  04/2006    [employer] Adapta   Prior to Admission medications   Medication Sig Start Date End Date Taking? Authorizing Provider  budesonide (PULMICORT) 0.25 MG/2ML nebulizer solution Use two times a day    Yes Historical Provider, MD  clonazePAM (KLONOPIN) 0.5 MG tablet Take 0.5 mg by mouth at bedtime.   Yes Historical Provider, MD  furosemide (LASIX) 40 MG tablet Take 1 tablet (40 mg total) by mouth daily. 10/21/12  Yes Duke Salvia, MD  glipiZIDE (GLUCOTROL) 5 MG tablet Take 7.5 mg by mouth daily.   Yes Historical Provider, MD  HYDROcodone-acetaminophen (NORCO/VICODIN) 5-325 MG per tablet Take 0.5-1 tablets by mouth every 8 (  eight) hours as needed for pain. prn 08/26/12  Yes Newt Lukes, MD  ipratropium (ATROVENT) 0.02 % nebulizer solution Take 500 mcg by nebulization 4 (four) times daily.     Yes Historical Provider, MD  LORazepam (ATIVAN) 0.5 MG tablet Take 0.5-1 mg by mouth at bedtime as needed (sleep).   Yes Historical Provider, MD  lovastatin (MEVACOR) 40 MG tablet Take 40 mg by mouth at bedtime.   Yes Historical Provider, MD  metoprolol succinate (TOPROL-XL) 25 MG 24 hr tablet Take 1 tablet (25 mg total) by mouth daily. 01/27/13  Yes Duke Salvia, MD  Multiple Vitamins-Iron  (QC DAILY MULTIVITAMINS/IRON) TABS Take 1 tablet by mouth daily.     Yes Historical Provider, MD  nitroGLYCERIN (NITROSTAT) 0.3 MG SL tablet Place 1 tablet (0.3 mg total) under the tongue every 5 (five) minutes as needed. 01/31/13  Yes Iran Ouch, MD  PredniSONE 5 MG TBEC Take 5 mg by mouth daily. 08/26/12  Yes Newt Lukes, MD  promethazine-codeine (PHENERGAN WITH CODEINE) 6.25-10 MG/5ML syrup Take 5 mLs by mouth every 4 (four) hours as needed for cough.   Yes Historical Provider, MD   Allergies  Allergen Reactions  . Asa Buff (Mag [Buffered Aspirin] Nausea Only  . Ibuprofen     REACTION: causes nausea and vomitting    FAMILY HISTORY:  Family History  Problem Relation Age of Onset  . Diabetes Neg Hx   . Hypertension Neg Hx   . Coronary artery disease Neg Hx    SOCIAL HISTORY:  reports that he has never smoked. He does not have any smokeless tobacco history on file. He reports that he does not use illicit drugs. His alcohol history is not on file.  REVIEW OF SYSTEMS:   Constitutional: Positive malaise/fatigue. Negative for fever, chills, weight loss,  and diaphoresis.  HENT: Positive for hearing loss, Negative ear pain, nosebleeds, congestion, sore throat, neck pain, tinnitus and ear discharge.   Eyes: Negative for blurred vision, double vision, photophobia, pain, discharge and redness.  Respiratory: Positive for cough, sputum production, shortness of breath, wheezing.  Cardiovascular: Negative for chest pain, palpitations, orthopnea, claudication, leg swelling and PND.  Gastrointestinal: Negative for heartburn, nausea, vomiting, abdominal pain, diarrhea, constipation, blood in stool and melena.  Genitourinary: Negative for dysuria, urgency, frequency, hematuria and flank pain.  Musculoskeletal: Negative for myalgias, back pain, joint pain and falls.  Skin: Negative for itching and rash.  Neurological: Negative for dizziness, tingling, tremors, sensory change, speech change,  focal weakness, seizures, loss of consciousness, weakness and headaches.  Endo/Heme/Allergies: Negative for environmental allergies and polydipsia. Does not bruise/bleed easily.  SUBJECTIVE:  No acute distress  VITAL SIGNS: Temp:  [97.8 F (36.6 C)-98.7 F (37.1 C)] 98 F (36.7 C) (09/25 0800) Pulse Rate:  [33-149] 75 (09/25 0800) Resp:  [15-32] 21 (09/25 0800) BP: (100-173)/(42-123) 142/59 mmHg (09/25 0800) SpO2:  [79 %-100 %] 100 % (09/25 0800) FiO2 (%):  [30 %-100 %] 30 % (09/25 0400)  PHYSICAL EXAMINATION: General: AOLx3, NAD, no focal def Cardiovascular: Irregular rate and rhythm, cool extremities, No JVD, >3 sec cap refill. No edema.  Lungs: Increased WOB with upper airway wheezing. Diminished breath sounds bilaterally.  Abdomen: Protuberant, soft, normoacvtive BS x 4.    Recent Labs Lab 02/05/13 0545 02/05/13 0548 02/05/13 1142 02/06/13 0123  NA 138 140  --  133*  K 4.2 4.1  --  4.5  CL 98 100  --  95*  CO2 29  --   --  23  BUN 19 19  --  24*  CREATININE 1.44* 1.60* 1.36* 1.66*  GLUCOSE 198* 198*  --  345*    Recent Labs Lab 02/05/13 0545 02/05/13 0548 02/05/13 1142  HGB 15.4 16.0 14.3  HCT 45.0 47.0 43.5  WBC 17.1*  --  14.7*  PLT 166  --  143*   Dg Chest 2 View  02/05/2013   CLINICAL DATA:  Shortness of breath. Left side abdominal pain.  EXAM: CHEST  2 VIEW  COMPARISON:  PA and lateral chest 09/05/2012.  FINDINGS: Mild streaky opacity in the left lung base is most consistent with atelectasis. The right lung is clear. Heart size is normal. No pneumothorax or pleural fluid. Pacing device noted.  IMPRESSION: Mild opacity left lung base most compatible with atelectasis.   Electronically Signed   By: Drusilla Kanner M.D.   On: 02/05/2013 06:30   Ct Abdomen Pelvis W Contrast  02/05/2013   CLINICAL DATA:  Mid to left abdomen pain and left lower quadrant pain for 2 days  EXAM: CT ABDOMEN AND PELVIS WITH CONTRAST  TECHNIQUE: Multidetector CT imaging of the abdomen  and pelvis was performed using the standard protocol following bolus administration of intravenous contrast.  CONTRAST:  70mL OMNIPAQUE IOHEXOL 300 MG/ML  SOLN  COMPARISON:  CT abdomen pelvis of 09/05/2008  FINDINGS: On lung window images there is minimal linear atelectasis at the left lung base. Cardiomegaly is stable. Pacer wire is noted. The liver enhances and is unchanged in configuration with no focal abnormality noted. At least 1 gallstone layers in the neck of the gallbladder and the gallbladder wall is not thickened. The pancreas is somewhat atrophic and the pancreatic duct is not dilated. The adrenal glands and spleen are unremarkable. The stomach is decompressed and difficult to evaluate. The kidneys enhance with no calculus or mass and on delayed images, the pelvocaliceal systems are unremarkable. The abdominal aorta is normal in caliber with moderate atheromatous change diffusely. Calcifications are present at the origins of the celiac axis and SMA as well as the renal arteries. No adenopathy is seen.  No abdominal wall hernia is seen. The urinary bladder is not well distended but no abnormality is seen. The prostate is within normal limits in size. No fluid is seen within the pelvis. Fat does into the inguinal canals. Multiple rectosigmoid colonic diverticular are present with diverticula also within the descending colon. However no mucosal edema of the colon is seen. The terminal ileum and the appendix are unremarkable. There are moderate degenerative changes in both hips. The SI joints appear somewhat sclerotic and sacroiliitis cannot be excluded. There are diffuse degenerative changes throughout the lumbar spine and facet joints with normal alignment maintained.  IMPRESSION: 1. No explanation for the patient's left abdominal pain is seen. No renal or ureteral calculi are noted. 2. Single gallstone within the gallbladder. No gallbladder wall thickening. 3. Multiple rectosigmoid and distal descending  colon diverticula. No diverticulitis. 4. The appendix and terminal ileum are unremarkable. 5. Cannot exclude sacroiliitis versus degenerative change of the SI joints.   Electronically Signed   By: Dwyane Dee M.D.   On: 02/05/2013 07:51    ASSESSMENT / PLAN:  Acute on chronic dyspnea in the presence of acute COPD exacerbation complicated by atrial fibrillation with RVR.  Plan: Continue antibiotics. (5 days for AECOPD) Utilize xopenex in place of albuterol.  Taper systemic steroids.   Maximize GERD therapy.  Rate control of atrial fib to be deferred to cardiology.  All other issues: CKD, DM, abd pain, CAD Per primary   PCCM will sign off, please call back if needed.  Alyson Reedy, M.D. [facility] Pulmonary/Critical Care Medicine. Pager: [phone]. After hours pager: [phone].

## 2013-02-06 NOTE — Plan of Care (Signed)
CBG's have been persistently elevated > 200 and BMET glucose this am was 345 so SSI increased to moderate.  Junious Silk, ANP

## 2013-02-06 NOTE — Progress Notes (Signed)
Inpatient Diabetes Program Recommendations  AACE/ADA: New Consensus Statement on Inpatient Glycemic Control (2013)  Target Ranges:  Prepandial:   less than 140 mg/dL      Peak postprandial:   less than 180 mg/dL (1-2 hours)      Critically ill patients:  140 - 180 mg/dL  Results for ANGLE, KAREL (MRN [id number]) as of 02/06/2013 09:11  Ref. Range 02/05/2013 16:49 02/05/2013 21:36 02/06/2013 07:37  Glucose-Capillary Latest Range: 70-99 mg/dL 409 (H) 811 (H) 914 (H)   Inpatient Diabetes Program Recommendations Correction (SSI): consider increasing to resistant scale during steroid therapy May also benefit from low dose basal could use Lantus or NPH (BID) Thank you  Piedad Climes BSN, RN,CDE Inpatient Diabetes Coordinator [phone] (team pager)

## 2013-02-06 NOTE — Progress Notes (Signed)
[facility] Progress Note [place] TEAM 1 - Stepdown/ICU TEAM   [name] BJY:[id number] DOB: 1921-01-01 DOA: 02/05/2013 PCP: [name], MD  Brief narrative: 79 y/o male with h/o A-fib, pacemaker for tachy/brady, HTN, h/o DVT, DM 2,  COPD on chronic Prednisone admitted for cough, cough with yellow sputum and left sided abdominal pain. Found to be having a COPD exacerbation with A-fib with RVR.  Had an episode of dyspnea last night which improved after breathing tx and change to a non-rebreather mask  Assessment/Plan: Principal Problem:   COPD exacerbation/ Acute bronchitis - taper steroids per pulm - cont Levaquin for acute bronchitis for total 5 days per pulm  Active Problems:   Atrial fibrillation with RVR - Paroxysmal - currently on Metoprolol home dose and additional short acting Cardizem - Stopped Coumadin on his own per pt 2 mo ago - cardio recommends eliquis- co pay $70/ mo- not sure if he can afford this    Abdominal pain - Ct scan did not reveal a pathology- resolved    Chronic steroid use - for COPD    Diabetes mellitus - uncontrolled-Hba1c is 8/9 - on Glipizide  - increased sliding scale - start lantus for now as he is on steroids -     HTN (hypertension) - controlled    CAD (coronary artery disease) - stable   Code Status: DNR Family Communication: none Disposition Plan: follow in SDU  Consultants: Card pulm   Procedures: none  Antibiotics: Levaquin- 9/24>>  DVT prophylaxis: heparin  HPI/Subjective: Pt feels overall well- he was incontinent of stool this morning and is bothered by that- he is not urinating as much as usual   Objective: Blood pressure 122/52, pulse 66, temperature 97.8 F (36.6 C), temperature source Oral, resp. rate 26, SpO2 94.00%.  Intake/Output Summary (Last 24 hours) at 02/06/13 1649 Last data filed at 02/06/13 1133  Gross per 24 hour  Intake 996.49 ml  Output      0 ml  Net 996.49 ml      Exam: General: No acute respiratory distress Lungs: Mild rhonchi b/l  Cardiovascular: Regular rate and rhythm without murmur gallop or rub normal S1 and S2 Abdomen: Nontender, distended and tympanic with high pitched bowel sounds, no rebound, no ascites, no appreciable mass Extremities: No significant cyanosis, clubbing, or edema bilateral lower extremities  Data Reviewed: Basic Metabolic Panel:  Recent Labs Lab 02/05/13 0545 02/05/13 0548 02/05/13 1142 02/06/13 0123  NA 138 140  --  133*  K 4.2 4.1  --  4.5  CL 98 100  --  95*  CO2 29  --   --  23  GLUCOSE 198* 198*  --  345*  BUN 19 19  --  24*  CREATININE 1.44* 1.60* 1.36* 1.66*  CALCIUM 9.3  --   --  8.1*   Liver Function Tests:  Recent Labs Lab 02/05/13 0545  AST 15  ALT 9  ALKPHOS 85  BILITOT 0.8  PROT 6.8  ALBUMIN 3.6   No results found for this basename: LIPASE, AMYLASE,  in the last 168 hours No results found for this basename: AMMONIA,  in the last 168 hours CBC:  Recent Labs Lab 02/05/13 0545 02/05/13 0548 02/05/13 1142 02/06/13 1445  WBC 17.1*  --  14.7* 20.5*  NEUTROABS 13.3*  --   --   --   HGB 15.4 16.0 14.3 14.4  HCT 45.0 47.0 43.5 41.6  MCV 93.9  --  94.0 91.6  PLT 166  --  143* 181   Cardiac Enzymes:  Recent Labs Lab 02/05/13 0545 02/05/13 1130 02/05/13 1648  TROPONINI <0.30 <0.30 <0.30   BNP (last 3 results)  Recent Labs  02/05/13 0545 02/06/13 0200  PROBNP 1337.0* 3722.0*   CBG:  Recent Labs Lab 02/05/13 1649 02/05/13 2136 02/06/13 0737 02/06/13 1116  GLUCAP 229* 224* 353* 352*    Recent Results (from the past 240 hour(s))  CULTURE, BLOOD (ROUTINE X 2)     Status: None   Collection Time    02/05/13 11:30 AM      Result Value Range Status   Specimen Description BLOOD RIGHT FOREARM   Final   Special Requests BOTTLES DRAWN AEROBIC AND ANAEROBIC 10CC   Final   Culture  Setup Time     Final   Value: 02/05/2013 15:55     Performed at [facility]    Culture     Final   Value:        BLOOD CULTURE RECEIVED NO GROWTH TO DATE CULTURE WILL BE HELD FOR 5 DAYS BEFORE ISSUING A FINAL NEGATIVE REPORT     Performed at [facility]   Report Status PENDING   Incomplete  CULTURE, BLOOD (ROUTINE X 2)     Status: None   Collection Time    02/05/13 11:40 AM      Result Value Range Status   Specimen Description BLOOD LEFT HAND   Final   Special Requests BOTTLES DRAWN AEROBIC ONLY 3CC   Final   Culture  Setup Time     Final   Value: 02/05/2013 15:55     Performed at [facility]   Culture     Final   Value:        BLOOD CULTURE RECEIVED NO GROWTH TO DATE CULTURE WILL BE HELD FOR 5 DAYS BEFORE ISSUING A FINAL NEGATIVE REPORT     Performed at [facility]   Report Status PENDING   Incomplete  MRSA PCR SCREENING     Status: None   Collection Time    02/05/13  6:58 PM      Result Value Range Status   MRSA by PCR NEGATIVE  NEGATIVE Final   Comment:            The GeneXpert MRSA Assay (FDA     approved for NASAL specimens     only), is one component of a     comprehensive MRSA colonization     surveillance program. It is not     intended to diagnose MRSA     infection nor to guide or     monitor treatment for     MRSA infections.     Studies:  Recent x-ray studies have been reviewed in detail by the Attending Physician  Scheduled Meds:  Scheduled Meds: . apixaban  2.5 mg Oral BID  . atorvastatin  10 mg Oral q1800  . benzonatate  200 mg Oral TID  . ciprofloxacin  2 drop Both Eyes Q2H while awake  . clonazePAM  0.5 mg Oral QHS  . diltiazem  60 mg Oral Q8H  . furosemide  40 mg Oral Daily  . [START ON 02/07/2013] glipiZIDE  7.5 mg Oral QAC breakfast  . guaiFENesin  600 mg Oral BID  . insulin aspart  0-15 Units Subcutaneous TID WC  . insulin aspart  0-5 Units Subcutaneous QHS  . ipratropium  0.5 mg Nebulization Q6H  . levalbuterol  0.63 mg Nebulization Q6H  . levofloxacin (  LEVAQUIN) IV  750 mg Intravenous Q48H   . methylPREDNISolone (SOLU-MEDROL) injection  60 mg Intravenous Q12H  . metoprolol succinate  25 mg Oral Daily  . sodium chloride  3 mL Intravenous Q12H   Continuous Infusions:   Time spent on care of this patient: 35 min   Calvert Cantor, MD  [facility] Office  [phone] Pager - Text Page per Loretha Stapler as per below:  On-Call/Text Page:      [email or web address]      password [email or web address]  If 7PM-7AM, please contact night-coverage [email or web address] Password [facility] 02/06/2013, 4:49 PM   LOS: 1 day

## 2013-02-06 NOTE — Evaluation (Signed)
Physical Therapy Evaluation Patient Details Name: [name] MRN: [id number] DOB: 04/13/1921 Today's Date: 02/06/2013 Time: 4098-1191 PT Time Calculation (min): 37 min  PT Assessment / Plan / Recommendation History of Present Illness  This is a 79 year old male, with known history of Atrial fibrillation/Tachy-Brady syndrome, s/p PPM, Previously on Coumadin, but this was discontinued about 2 months ago. HTN, CAD, s/p PCI/DES to circumflex artery, previous DVT/PE, allergic rhinitis, bronchial asthma, COPD on chronic Prednisone therapy, dyslipidemia, DM-2. According to patient and his spouse, who were in the ED, patient has had a chronic productive cough, and over the last couple of days, this has become productive of yellow phlegm, without fever or chills, chest pain or increased SOB. He had left-sided abdominal pain through last night, and slept poorly, but had no vomiting or diarrhea. Patient is ambulant only with walker, and effort tolerance is poor. In the ED, patient was found to have temp 98.2, wcc 17.1, BP 140/78-137/92, and fast A.Fib with HR 108-149.   Clinical Impression  Pt admitted with the above. Pt currently with functional limitations due to the deficits listed below (see PT Problem List). Limited evaluation due to loose stool upon standing and unable to ambulate due to loose stool.  Pt will benefit from skilled PT to increase their independence and safety with mobility to allow discharge to the venue listed below.  Need to further assess caregiver support at home to determine best d/c plans    PT Assessment  Patient needs continued PT services    Follow Up Recommendations  Home health PT;Supervision/Assistance - 24 hour;Other (comment) (Depending on caregiver support may need SNF)    Barriers to Discharge Other (comment) (Need to further assess caregiver support)      Equipment Recommendations  None recommended by PT    Frequency Min 3X/week    Precautions /  Restrictions Precautions Precautions: Sternal Restrictions Weight Bearing Restrictions: No   Pertinent Vitals/Pain No c/o pain      Mobility  Bed Mobility Bed Mobility: Supine to Sit Supine to Sit: 4: Min assist;HOB elevated;With rails Details for Bed Mobility Assistance: (A) to elevate trunk OOB with cues for technique Transfers Transfers: Sit to Stand;Stand to Sit;Stand Pivot Transfers Sit to Stand: 3: Mod assist;From bed;From elevated surface Stand to Sit: 3: Mod assist;To chair/3-in-1 Stand Pivot Transfers: 4: Min assist Details for Transfer Assistance: (A) to initiate transfer with max cues for hand placement.  Pt tends to hold onto RW during transfers Ambulation/Gait Ambulation/Gait Assistance: Not tested (comment) (Limited due to loose stool)    Exercises     PT Diagnosis: Difficulty walking;Generalized weakness  PT Problem List: Decreased strength;Decreased activity tolerance;Decreased balance;Decreased mobility;Decreased cognition;Decreased knowledge of use of DME;Cardiopulmonary status limiting activity PT Treatment Interventions: DME instruction;Gait training;Functional mobility training;Therapeutic activities;Therapeutic exercise;Balance training;Cognitive remediation;Patient/family education;Stair training     PT Goals(Current goals can be found in the care plan section) Acute Rehab PT Goals Patient Stated Goal: To go home PT Goal Formulation: With patient Time For Goal Achievement: 02/13/13 Potential to Achieve Goals: Good  Visit Information  Last PT Received On: 02/06/13 Assistance Needed: +2 History of Present Illness: This is a 79 year old male, with known history of Atrial fibrillation/Tachy-Brady syndrome, s/p PPM, Previously on Coumadin, but this was discontinued about 2 months ago. HTN, CAD, s/p PCI/DES to circumflex artery, previous DVT/PE, allergic rhinitis, bronchial asthma, COPD on chronic Prednisone therapy, dyslipidemia, DM-2. According to patient  and his spouse, who were in the ED, patient has  had a chronic productive cough, and over the last couple of days, this has become productive of yellow phlegm, without fever or chills, chest pain or increased SOB. He had left-sided abdominal pain through last night, and slept poorly, but had no vomiting or diarrhea. Patient is ambulant only with walker, and effort tolerance is poor. In the ED, patient was found to have temp 98.2, wcc 17.1, BP 140/78-137/92, and fast A.Fib with HR 108-149.        Prior Functioning  Home Living Family/patient expects to be discharged to:: Other (Comment) Living Arrangements: Spouse/significant other;Children (son) Available Help at Discharge: Family Type of Home: House Additional Comments: Pt poor historain and unable to home environment or PLOF Prior Function Comments: Pt did report using RW however pt limited of information.  Per RN pt's wife stated pt would lay in bed all day but would get up and walk to dining room table for meals with RW.  Communication Communication: HOH    Cognition  Cognition Arousal/Alertness: Awake/alert Behavior During Therapy: WFL for tasks assessed/performed Overall Cognitive Status: No family/caregiver present to determine baseline cognitive functioning (Pt with slight confusion and slow to process)    Extremity/Trunk Assessment Lower Extremity Assessment Lower Extremity Assessment: Generalized weakness   Balance Balance Balance Assessed: Yes Static Sitting Balance Static Sitting - Balance Support: Feet supported Static Sitting - Level of Assistance: 6: Modified independent (Device/Increase time) Static Standing Balance Static Standing - Balance Support: Bilateral upper extremity supported Static Standing - Level of Assistance: 4: Min assist Static Standing - Comment/# of Minutes: (A) to maintain balance while RN staff complete pericare  End of Session PT - End of Session Equipment Utilized During Treatment: Gait  belt;Oxygen (3L) Activity Tolerance: Patient limited by fatigue Patient left: in chair;with call bell/phone within reach;with nursing/sitter in room Nurse Communication: Mobility status  GP     [name] 02/06/2013, 11:22 AM  Jake Shark, PT DPT [phone]

## 2013-02-06 NOTE — Progress Notes (Addendum)
Subjective: Patient denes CP  Was SOB last night ("Maybe it was from the Coke I drank").  Better now Objective: Filed Vitals:   02/06/13 0450 02/06/13 0500 02/06/13 0600 02/06/13 0700  BP:  130/113 133/54 142/62  Pulse: 71 73 70 76  Temp:      TempSrc:      Resp: 29 21 26 24   SpO2: 98% 97% 99% 98%   Weight change:   Intake/Output Summary (Last 24 hours) at 02/06/13 0752 Last data filed at 02/06/13 0700  Gross per 24 hour  Intake 687.14 ml  Output      0 ml  Net 687.14 ml    General: Alert, awake, oriented x3, in no acute distress Neck: Full Heart: Regular rate and rhythm, without murmurs, rubs, gallops.  Lung:  Mild rales.   Exemities:  No edema.   Neuro: Grossly intact, nonfocal.  Tele:  Now in SR  Converted fro afib.  Lab Results: Results for orders placed during the hospital encounter of 02/05/13 (from the past 24 hour(s))  URINALYSIS, ROUTINE W REFLEX MICROSCOPIC     Status: Abnormal   Collection Time    02/05/13  7:56 AM      Result Value Range   Color, Urine YELLOW  YELLOW   APPearance CLEAR  CLEAR   Specific Gravity, Urine 1.015  1.005 - 1.030   pH 5.5  5.0 - 8.0   Glucose, UA 100 (*) NEGATIVE mg/dL   Hgb urine dipstick MODERATE (*) NEGATIVE   Bilirubin Urine SMALL (*) NEGATIVE   Ketones, ur NEGATIVE  NEGATIVE mg/dL   Protein, ur 30 (*) NEGATIVE mg/dL   Urobilinogen, UA 1.0  0.0 - 1.0 mg/dL   Nitrite NEGATIVE  NEGATIVE   Leukocytes, UA NEGATIVE  NEGATIVE  URINE MICROSCOPIC-ADD ON     Status: None   Collection Time    02/05/13  7:56 AM      Result Value Range   Squamous Epithelial / LPF RARE  RARE   WBC, UA 0-2  <3 WBC/hpf   RBC / HPF 7-10  <3 RBC/hpf   Bacteria, UA RARE  RARE   Urine-Other AMORPHOUS URATES/PHOSPHATES    TROPONIN I     Status: None   Collection Time    02/05/13 11:30 AM      Result Value Range   Troponin I <0.30  <0.30 ng/mL  LIPID PANEL     Status: Abnormal   Collection Time    02/05/13 11:30 AM      Result Value Range   Cholesterol 173  0 - 200 mg/dL   Triglycerides 563 (*) <150 mg/dL   HDL 44  >87 mg/dL   Total CHOL/HDL Ratio 3.9     VLDL 31  0 - 40 mg/dL   LDL Cholesterol 98  0 - 99 mg/dL  HEMOGLOBIN F6E     Status: Abnormal   Collection Time    02/05/13 11:42 AM      Result Value Range   Hemoglobin A1C 8.9 (*) <5.7 %   Mean Plasma Glucose 209 (*) <117 mg/dL  TSH     Status: None   Collection Time    02/05/13 11:42 AM      Result Value Range   TSH 2.194  0.350 - 4.500 uIU/mL  CBC     Status: Abnormal   Collection Time    02/05/13 11:42 AM      Result Value Range   WBC 14.7 (*) 4.0 - 10.5 K/uL  RBC 4.63  4.22 - 5.81 MIL/uL   Hemoglobin 14.3  13.0 - 17.0 g/dL   HCT 16.1  09.6 - 04.5 %   MCV 94.0  78.0 - 100.0 fL   MCH 30.9  26.0 - 34.0 pg   MCHC 32.9  30.0 - 36.0 g/dL   RDW 40.9  81.1 - 91.4 %   Platelets 143 (*) 150 - 400 K/uL  CREATININE, SERUM     Status: Abnormal   Collection Time    02/05/13 11:42 AM      Result Value Range   Creatinine, Ser 1.36 (*) 0.50 - 1.35 mg/dL   GFR calc non Af Amer 44 (*) >90 mL/min   GFR calc Af Amer 51 (*) >90 mL/min  TROPONIN I     Status: None   Collection Time    02/05/13  4:48 PM      Result Value Range   Troponin I <0.30  <0.30 ng/mL  GLUCOSE, CAPILLARY     Status: Abnormal   Collection Time    02/05/13  4:49 PM      Result Value Range   Glucose-Capillary 229 (*) 70 - 99 mg/dL   Comment 1 Notify RN    PROTIME-INR     Status: Abnormal   Collection Time    02/05/13  6:47 PM      Result Value Range   Prothrombin Time 15.3 (*) 11.6 - 15.2 seconds   INR 1.24  0.00 - 1.49  APTT     Status: Abnormal   Collection Time    02/05/13  6:47 PM      Result Value Range   aPTT 173 (*) 24 - 37 seconds  MRSA PCR SCREENING     Status: None   Collection Time    02/05/13  6:58 PM      Result Value Range   MRSA by PCR NEGATIVE  NEGATIVE  GLUCOSE, CAPILLARY     Status: Abnormal   Collection Time    02/05/13  9:36 PM      Result Value Range    Glucose-Capillary 224 (*) 70 - 99 mg/dL   Comment 1 Documented in Chart     Comment 2 Notify RN    BLOOD GAS, ARTERIAL     Status: Abnormal   Collection Time    02/06/13 12:21 AM      Result Value Range   FIO2 1.00     Delivery systems NON-REBREATHER OXYGEN MASK     pH, Arterial 7.370  7.350 - 7.450   pCO2 arterial 40.1  35.0 - 45.0 mmHg   pO2, Arterial 172.0 (*) 80.0 - 100.0 mmHg   Bicarbonate 22.6  20.0 - 24.0 mEq/L   TCO2 23.8  0 - 100 mmol/L   Acid-base deficit 1.9  0.0 - 2.0 mmol/L   O2 Saturation 99.6     Patient temperature 98.6     Collection site RIGHT RADIAL     Drawn by [phone]     Sample type ARTERIAL DRAW     Allens test (pass/fail) PASS  PASS  HEPARIN LEVEL (UNFRACTIONATED)     Status: Abnormal   Collection Time    02/06/13  1:23 AM      Result Value Range   Heparin Unfractionated 0.92 (*) 0.30 - 0.70 IU/mL  BASIC METABOLIC PANEL     Status: Abnormal   Collection Time    02/06/13  1:23 AM      Result Value Range   Sodium 133 (*)  135 - 145 mEq/L   Potassium 4.5  3.5 - 5.1 mEq/L   Chloride 95 (*) 96 - 112 mEq/L   CO2 23  19 - 32 mEq/L   Glucose, Bld 345 (*) 70 - 99 mg/dL   BUN 24 (*) 6 - 23 mg/dL   Creatinine, Ser 4.54 (*) 0.50 - 1.35 mg/dL   Calcium 8.1 (*) 8.4 - 10.5 mg/dL   GFR calc non Af Amer 35 (*) >90 mL/min   GFR calc Af Amer 40 (*) >90 mL/min    Studies/Results: @RISRSLT24 @  Medications: Reviewed   @PROBHOSP @  1.  Afib  Patient converted to SR.   Pacer interrogation shows only 2 episodes of afib (9/19 for 24Hr, 9/24 for 6 hours)  I would keep on current regeimn of bisoprolol and diltiazem  Switch to PO With his high CHA2DSVASc I would recomm Eliquis.   Patient had difficulty getting to clinic for coumadin.  Would check BNP with flare last night. CXR clear but has some ratles today.  May benefit from extra lasix.  2.  CAD  No evid for active ischemia  3.  COPD  Pulm addressing.        LOS: 1 day   Dietrich Pates 02/06/2013, 7:52 AM

## 2013-02-06 NOTE — Progress Notes (Signed)
Shift event: RN paged this NP earlier in shift secondary to pt being in resp distress. Pt advanced to 50% ventimask and stat ABG ordered. NP to beside.  Upon arrival, RN had called RT and pt had received a Xopenex treatment and advanced to NRB. S: Pt says he feels much better after the breathing tx and advancement to NRB. He still feels a little SOB, but much improved. He denies any chest pain. O: Chronically ill appearing elderly WM in mild resp distress. He does not appear toxic. Vital signs reviewed. O2 sat normal on NRB. Alert and oriented. Tachypneic, but resp effort with minor use of accessory muscles and is improving. RRR. Follows commands and no focal neuro deficit noted. A/P: 1. Acute resp distress-COPD/exacerbation-ABG looked fine. Pt weaned down to Venti mask and is holding his sats. His RR is now in the 20s.with no use of accessory muscles. Continue nebs. Will cont to follow. Jimmye Norman, NP [facility]

## 2013-02-06 NOTE — Progress Notes (Signed)
PULMONARY  / CRITICAL CARE MEDICINE  Name: [name] MRN: [id number] DOB: 1920-12-17    ADMISSION DATE:  02/05/2013 CONSULTATION DATE:  02/05/2009  REFERRING MD :  Brien Few PRIMARY SERVICE:  [facility]  CHIEF COMPLAINT:  COPD Exacerbation   BRIEF PATIENT DESCRIPTION: 79 year old male, with known history of Atrial fibrillation/Tachy-Brady syndrome, s/p PPM, Previously on Coumadin, but this was discontinued about 2 months ago.allergic rhinitis, bronchial asthma, COPD on chronic Prednisone therapy (5 mg QD) admitted 9/24 with AECOPD complicated by A. Fib with RVR.   SIGNIFICANT EVENTS / STUDIES:  9/24: CT Scan negative   LINES / TUBES:   CULTURES: 9/24  Blood cultures>>>  ANTIBIOTICS: 9/24 Levaquin >>>> 9/24 Rocephin  (X1 )   PHYSICAL EXAMINATION: General: AOLx3, NAD, no focal def Cardiovascular: RRR, cool extremities, No JVD, >3 sec cap refill. No edema.  Lungs: Diminished breath sounds bilaterally with expiratory wheeze primarily upper airway   Abdomen: Protuberant, soft, normoacvtive BS x 4.    Recent Labs Lab 02/05/13 0545 02/05/13 0548 02/05/13 1142 02/06/13 0123  NA 138 140  --  133*  K 4.2 4.1  --  4.5  CL 98 100  --  95*  CO2 29  --   --  23  BUN 19 19  --  24*  CREATININE 1.44* 1.60* 1.36* 1.66*  GLUCOSE 198* 198*  --  345*    Recent Labs Lab 02/05/13 0545 02/05/13 0548 02/05/13 1142  HGB 15.4 16.0 14.3  HCT 45.0 47.0 43.5  WBC 17.1*  --  14.7*  PLT 166  --  143*   Dg Chest 2 View  02/05/2013   CLINICAL DATA:  Shortness of breath. Left side abdominal pain.  EXAM: CHEST  2 VIEW  COMPARISON:  PA and lateral chest 09/05/2012.  FINDINGS: Mild streaky opacity in the left lung base is most consistent with atelectasis. The right lung is clear. Heart size is normal. No pneumothorax or pleural fluid. Pacing device noted.  IMPRESSION: Mild opacity left lung base most compatible with atelectasis.   Electronically Signed   By: Drusilla Kanner M.D.   On: 02/05/2013  06:30   Ct Abdomen Pelvis W Contrast  02/05/2013   CLINICAL DATA:  Mid to left abdomen pain and left lower quadrant pain for 2 days  EXAM: CT ABDOMEN AND PELVIS WITH CONTRAST  TECHNIQUE: Multidetector CT imaging of the abdomen and pelvis was performed using the standard protocol following bolus administration of intravenous contrast.  CONTRAST:  70mL OMNIPAQUE IOHEXOL 300 MG/ML  SOLN  COMPARISON:  CT abdomen pelvis of 09/05/2008  FINDINGS: On lung window images there is minimal linear atelectasis at the left lung base. Cardiomegaly is stable. Pacer wire is noted. The liver enhances and is unchanged in configuration with no focal abnormality noted. At least 1 gallstone layers in the neck of the gallbladder and the gallbladder wall is not thickened. The pancreas is somewhat atrophic and the pancreatic duct is not dilated. The adrenal glands and spleen are unremarkable. The stomach is decompressed and difficult to evaluate. The kidneys enhance with no calculus or mass and on delayed images, the pelvocaliceal systems are unremarkable. The abdominal aorta is normal in caliber with moderate atheromatous change diffusely. Calcifications are present at the origins of the celiac axis and SMA as well as the renal arteries. No adenopathy is seen.  No abdominal wall hernia is seen. The urinary bladder is not well distended but no abnormality is seen. The prostate is within normal limits  in size. No fluid is seen within the pelvis. Fat does into the inguinal canals. Multiple rectosigmoid colonic diverticular are present with diverticula also within the descending colon. However no mucosal edema of the colon is seen. The terminal ileum and the appendix are unremarkable. There are moderate degenerative changes in both hips. The SI joints appear somewhat sclerotic and sacroiliitis cannot be excluded. There are diffuse degenerative changes throughout the lumbar spine and facet joints with normal alignment maintained.  IMPRESSION:  1. No explanation for the patient's left abdominal pain is seen. No renal or ureteral calculi are noted. 2. Single gallstone within the gallbladder. No gallbladder wall thickening. 3. Multiple rectosigmoid and distal descending colon diverticula. No diverticulitis. 4. The appendix and terminal ileum are unremarkable. 5. Cannot exclude sacroiliitis versus degenerative change of the SI joints.   Electronically Signed   By: Dwyane Dee M.D.   On: 02/05/2013 07:51    ASSESSMENT / PLAN:  Acute on chronic dyspnea in the presence of acute COPD exacerbation complicated by atrial fibrillation with RVR.  Plan: Continue antibiotics. (5 days for AECOPD) Utilize xopenex in place of albuterol.  Taper systemic steroids.   Maximize GERD therapy.  Rate control of atrial fib to be deferred to cardiology.   All other issues: CKD, DM, abd pain, CAD Per primary   Patient is improving. Service to sign off. Please contact PRN.   Pedro Earls NP-S   Pulmonary and Critical Care Medicine [facility]  02/06/2013, 12:29 PM

## 2013-02-06 NOTE — Care Management Note (Addendum)
    Page 1 of 2   02/11/2013     3:47:06 PM   CARE MANAGEMENT NOTE 02/11/2013  Patient:  [name], [name]   Account Number:  [id number]  Date Initiated:  02/06/2013  Documentation initiated by:  Junius Creamer  Subjective/Objective Assessment:   adm w at fib     Action/Plan:   lives w wife, pcp dr v leschber   Anticipated DC Date:  02/12/2013   Anticipated DC Plan:  HOME W HOME HEALTH SERVICES      DC Planning Services  CM consult  Medication Assistance      [facility] Choice  HOME HEALTH   Choice offered to / List presented to:  C-4 Adult Children        HH arranged  HH-1 RN  HH-2 PT  HH-3 OT  HH-6 SOCIAL WORKER      HH agency  [facility]   Status of service:  Completed, signed off Medicare Important Message given?   (If response is "NO", the following Medicare IM given date fields will be blank) Date Medicare IM given:   Date Additional Medicare IM given:    Discharge Disposition:  HOME W HOME HEALTH SERVICES  Per UR Regulation:  Reviewed for med. necessity/level of care/duration of stay  If discussed at Long Length of Stay Meetings, dates discussed:    Comments:  02/11/13  Jackson Fetters,RN,BSN [phone] PT FOR DC HOME TODAY WITH FAMILY.  MD ORDERED HH CARE; REFERRAL TO [facility], PER PT/FAMILY CHOICE.  START OF CARE 24-48H POST DC DATE.  02/10/13 Maryrose Colvin,RN,BSN [phone] SPOKE WITH PT AND DAUGHTER AT BEDSIDE.  CONFIRMED THAT PT HAS 24HR CARE AT DC; DTR STATES THAT WIFE AND SON ARE AVAILABLE 24HRS A DAY TO CARE FOR PT.  PT HAS ALL NEEDED DME AT HOME; PREFERS [facility] FOR [facility] CARE.   NEEDS HHRN, PT, AND OT UPON DC.  MD:  PLEASE ORDER, IF YOU AGREE AND COMPLETE FACE TO FACE DOCUMENTATION FOR MEDICARE. THANK YOU.  9/26 1454 debbie dowell rn,bsn copay for xarelto 30 for preferred pharm and 45.00 for in network pharm. preffered is [facility]left xarelto 30day free card in room if needed.  9/26 1152 a   debbie dowell rn,bsn spoke w pt and wife.  phy ther rec hhpt. wife w pt 24/7. they have used [facility] in past and would like them again for [facility]. ref to donna w [facility] for rn and phy ther. will await final dc needs.  9/25 0940 debbie dowell rn,bsn gave pt eliquis 30day free card. sm sec to ck on copay for eliquis. has 70.00 per month at preferred pharm copay for eliquis.

## 2013-02-06 NOTE — Progress Notes (Signed)
Pharmacy Consult - Eliquis  Eliquis to begin for Afib Scr > 1.5 at 1.71 Age > 77 years of age Weight = 77 kg  Plan: 1) Discontinue heparin 2) Begin Eliquis at 2.5 mg po BID 3) Pharmacy will follow along peripherally.  Thank you. Okey Regal, PharmD [phone]

## 2013-02-07 ENCOUNTER — Ambulatory Visit: Payer: Medicare Other | Admitting: Internal Medicine

## 2013-02-07 DIAGNOSIS — I2510 Atherosclerotic heart disease of native coronary artery without angina pectoris: Secondary | ICD-10-CM

## 2013-02-07 DIAGNOSIS — I4891 Unspecified atrial fibrillation: Secondary | ICD-10-CM

## 2013-02-07 LAB — BASIC METABOLIC PANEL
BUN: 49 mg/dL — ABNORMAL HIGH (ref 6–23)
CO2: 19 mEq/L (ref 19–32)
Calcium: 8.8 mg/dL (ref 8.4–10.5)
Chloride: 95 mEq/L — ABNORMAL LOW (ref 96–112)
Creatinine, Ser: 2.45 mg/dL — ABNORMAL HIGH (ref 0.50–1.35)
GFR calc Af Amer: 25 mL/min — ABNORMAL LOW (ref >90)
Glucose, Bld: 247 mg/dL — ABNORMAL HIGH (ref 70–99)

## 2013-02-07 LAB — GLUCOSE, CAPILLARY
Glucose-Capillary: 259 mg/dL — ABNORMAL HIGH (ref 70–99)
Glucose-Capillary: 217 mg/dL — ABNORMAL HIGH (ref 70–99)
Glucose-Capillary: 242 mg/dL — ABNORMAL HIGH (ref 70–99)
Glucose-Capillary: 273 mg/dL — ABNORMAL HIGH (ref 70–99)

## 2013-02-07 LAB — CBC
HCT: 38.4 % — ABNORMAL LOW (ref 39.0–52.0)
Hemoglobin: 12.6 g/dL — ABNORMAL LOW (ref 13.0–17.0)
MCH: 30.6 pg (ref 26.0–34.0)
MCHC: 32.8 g/dL (ref 30.0–36.0)
RDW: 13.6 % (ref 11.5–15.5)

## 2013-02-07 MED ORDER — FUROSEMIDE 10 MG/ML IJ SOLN
40.0000 mg | Freq: Once | INTRAMUSCULAR | Status: AC
  Administered 2013-02-07: 40 mg via INTRAVENOUS
  Filled 2013-02-07: qty 4

## 2013-02-07 MED ORDER — RIVAROXABAN 15 MG PO TABS
15.0000 mg | ORAL_TABLET | Freq: Every day | ORAL | Status: DC
  Administered 2013-02-07 – 2013-02-10 (×4): 15 mg via ORAL
  Filled 2013-02-07 (×5): qty 1

## 2013-02-07 MED ORDER — DILTIAZEM HCL ER COATED BEADS 180 MG PO CP24
180.0000 mg | ORAL_CAPSULE | Freq: Every day | ORAL | Status: DC
  Administered 2013-02-07: 180 mg via ORAL
  Filled 2013-02-07 (×2): qty 1
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
## 2013-02-07 NOTE — Progress Notes (Signed)
[facility] Progress Note  TEAM 1 - Stepdown/ICU TEAM   [name] HYQ:[id number] DOB: [birth year] DOA: 02/05/2013 PCP: [name], MD  Brief narrative: 79 y/o male with h/o A-fib, pacemaker for tachy/brady, HTN, h/o DVT, DM 2,  COPD on chronic Prednisone admitted for cough, cough with yellow sputum and left sided abdominal pain. Found to be having a COPD exacerbation with A-fib with RVR.  Had an episode of dyspnea last night which improved after breathing tx and change to a non-rebreather mask  Assessment/Plan: Principal Problem:   COPD exacerbation/ Acute bronchitis - taper steroids per pulm - cont Levaquin for acute bronchitis for total 5 days per pulm  Active Problems:   Atrial fibrillation with RVR - Paroxysmal - currently on Metoprolol home dose and additional short acting Cardizem - will change to long acting cardizem in AM - Stopped Coumadin on his own per pt 2 mo ago - cardio recommends eliquis- co pay $70/ mo-  - Xarelto copay $30-40- will switch to Xarelto- discussed with Dr Tenny Craw    Abdominal pain - Ct scan did not reveal a pathology- resolved    Chronic steroid use - for COPD    Diabetes mellitus - uncontrolled-Hba1c is 8/9 - on Glipizide  - increased sliding scale - start lantus for now as he is on steroids -     HTN (hypertension) - controlled    CAD (coronary artery disease) - stable   Code Status: DNR Family Communication: none- called wife but she did not answer Disposition Plan: d/c home tomorrow  Consultants: Card pulm   Procedures: none  Antibiotics: Levaquin- 9/24>>  DVT prophylaxis: heparin  HPI/Subjective: Pt  quite confused today- has a productive cough   Objective: Blood pressure 129/49, pulse 75, temperature 97.3 F (36.3 C), temperature source Oral, resp. rate 18, SpO2 93.00%.  Intake/Output Summary (Last 24 hours) at 02/07/13 1744 Last data filed at 02/07/13 1600  Gross per 24 hour  Intake    1040 ml  Output    350 ml  Net    690 ml     Exam: General: No acute respiratory distress Lungs: Mild rhonchi b/l  Cardiovascular: Regular rate and rhythm without murmur gallop or rub normal S1 and S2 Abdomen: Nontender, distended and tympanic with high pitched bowel sounds, no rebound, no ascites, no appreciable mass Extremities: No significant cyanosis, clubbing, or edema bilateral lower extremities  Data Reviewed: Basic Metabolic Panel:  Recent Labs Lab 02/05/13 0545 02/05/13 0548 02/05/13 1142 02/06/13 0123 02/07/13 1017  NA 138 140  --  133* 135  K 4.2 4.1  --  4.5 4.2  CL 98 100  --  95* 95*  CO2 29  --   --  23 19  GLUCOSE 198* 198*  --  345* 247*  BUN 19 19  --  24* 49*  CREATININE 1.44* 1.60* 1.36* 1.66* 2.45*  CALCIUM 9.3  --   --  8.1* 8.8   Liver Function Tests:  Recent Labs Lab 02/05/13 0545  AST 15  ALT 9  ALKPHOS 85  BILITOT 0.8  PROT 6.8  ALBUMIN 3.6   No results found for this basename: LIPASE, AMYLASE,  in the last 168 hours No results found for this basename: AMMONIA,  in the last 168 hours CBC:  Recent Labs Lab 02/05/13 0545 02/05/13 0548 02/05/13 1142 02/06/13 1445 02/07/13 0500  WBC 17.1*  --  14.7* 20.5* 24.0*  NEUTROABS 13.3*  --   --   --   --  HGB 15.4 16.0 14.3 14.4 12.6*  HCT 45.0 47.0 43.5 41.6 38.4*  MCV 93.9  --  94.0 91.6 93.2  PLT 166  --  143* 181 176   Cardiac Enzymes:  Recent Labs Lab 02/05/13 0545 02/05/13 1130 02/05/13 1648  TROPONINI <0.30 <0.30 <0.30   BNP (last 3 results)  Recent Labs  02/05/13 0545 02/06/13 0200  PROBNP 1337.0* 3722.0*   CBG:  Recent Labs Lab 02/06/13 1706 02/06/13 2128 02/07/13 0736 02/07/13 1137 02/07/13 1558  GLUCAP 161* 259* 217* 242* 303*    Recent Results (from the past 240 hour(s))  URINE CULTURE     Status: None   Collection Time    02/05/13  7:56 AM      Result Value Range Status   Specimen Description URINE, RANDOM   Final   Special Requests ADDED AT  0809A ON 161096   Final   Culture  Setup Time     Final   Value: 02/05/2013 14:46     Performed at [facility]   Colony Count     Final   Value: NO GROWTH     Performed at [facility]   Culture     Final   Value: NO GROWTH     Performed at [facility]   Report Status 02/06/2013 FINAL   Final  CULTURE, BLOOD (ROUTINE X 2)     Status: None   Collection Time    02/05/13 11:30 AM      Result Value Range Status   Specimen Description BLOOD RIGHT FOREARM   Final   Special Requests BOTTLES DRAWN AEROBIC AND ANAEROBIC 10CC   Final   Culture  Setup Time     Final   Value: 02/05/2013 15:55     Performed at [facility]   Culture     Final   Value:        BLOOD CULTURE RECEIVED NO GROWTH TO DATE CULTURE WILL BE HELD FOR 5 DAYS BEFORE ISSUING A FINAL NEGATIVE REPORT     Performed at [facility]   Report Status PENDING   Incomplete  CULTURE, BLOOD (ROUTINE X 2)     Status: None   Collection Time    02/05/13 11:40 AM      Result Value Range Status   Specimen Description BLOOD LEFT HAND   Final   Special Requests BOTTLES DRAWN AEROBIC ONLY 3CC   Final   Culture  Setup Time     Final   Value: 02/05/2013 15:55     Performed at [facility]   Culture     Final   Value:        BLOOD CULTURE RECEIVED NO GROWTH TO DATE CULTURE WILL BE HELD FOR 5 DAYS BEFORE ISSUING A FINAL NEGATIVE REPORT     Performed at [facility]   Report Status PENDING   Incomplete  MRSA PCR SCREENING     Status: None   Collection Time    02/05/13  6:58 PM      Result Value Range Status   MRSA by PCR NEGATIVE  NEGATIVE Final   Comment:            The GeneXpert MRSA Assay (FDA     approved for NASAL specimens     only), is one component of a     comprehensive MRSA colonization     surveillance program. It is not     intended to diagnose MRSA  infection nor to guide or     monitor treatment for     MRSA infections.     Studies:  Recent x-ray studies  have been reviewed in detail by the Attending Physician  Scheduled Meds:  Scheduled Meds: . apixaban  2.5 mg Oral BID  . atorvastatin  10 mg Oral q1800  . benzonatate  200 mg Oral TID  . ciprofloxacin  2 drop Both Eyes Q2H while awake  . clonazePAM  0.5 mg Oral QHS  . diltiazem  180 mg Oral Daily  . furosemide  40 mg Oral Daily  . glipiZIDE  7.5 mg Oral QAC breakfast  . guaiFENesin  600 mg Oral BID  . insulin aspart  0-15 Units Subcutaneous TID WC  . insulin aspart  0-5 Units Subcutaneous QHS  . insulin glargine  15 Units Subcutaneous QHS  . ipratropium  0.5 mg Nebulization QID  . levalbuterol  0.63 mg Nebulization QID  . levofloxacin (LEVAQUIN) IV  750 mg Intravenous Q48H  . methylPREDNISolone (SOLU-MEDROL) injection  60 mg Intravenous Q12H  . metoprolol succinate  25 mg Oral Daily  . sodium chloride  3 mL Intravenous Q12H   Continuous Infusions:   Time spent on care of this patient: 35 min   Calvert Cantor, MD  [facility] Office  [phone] Pager - Text Page per Loretha Stapler as per below:  On-Call/Text Page:      [email or web address]      password [email or web address]  If 7PM-7AM, please contact night-coverage [email or web address] Password [facility] 02/07/2013, 5:44 PM   LOS: 2 days

## 2013-02-07 NOTE — Progress Notes (Signed)
Subjective: Patien confused.  Does not know he is in hosp.  Denies CP  Says breathing is better. Objective: Filed Vitals:   02/07/13 0000 02/07/13 0607 02/07/13 0712 02/07/13 0733  BP: 144/105 118/91 118/91   Pulse: 142  75   Temp:  97.9 F (36.6 C)  98.2 F (36.8 C)  TempSrc:  Oral  Oral  Resp: 31 25 26    SpO2: 94% 96% 96% 94%   Weight change:   Intake/Output Summary (Last 24 hours) at 02/07/13 9147 Last data filed at 02/06/13 1700  Gross per 24 hour  Intake 364.85 ml  Output    300 ml  Net  64.85 ml    General: Alert, awake, oriented x3, in no acute distress Neck:  JVP is normal Heart: Regular rate and rhythm, without murmurs, rubs, gallops.  Lungs: Moving air  Wheezes bilaterally Exemities:  No edema.   Neuro: Grossly intact, nonfocal.   Tele:  SR with occasional A paced  Lab Results: Results for orders placed during the hospital encounter of 02/05/13 (from the past 24 hour(s))  GLUCOSE, CAPILLARY     Status: Abnormal   Collection Time    02/06/13 11:16 AM      Result Value Range   Glucose-Capillary 352 (*) 70 - 99 mg/dL  CBC     Status: Abnormal   Collection Time    02/06/13  2:45 PM      Result Value Range   WBC 20.5 (*) 4.0 - 10.5 K/uL   RBC 4.54  4.22 - 5.81 MIL/uL   Hemoglobin 14.4  13.0 - 17.0 g/dL   HCT 82.9  56.2 - 13.0 %   MCV 91.6  78.0 - 100.0 fL   MCH 31.7  26.0 - 34.0 pg   MCHC 34.6  30.0 - 36.0 g/dL   RDW 86.5  78.4 - 69.6 %   Platelets 181  150 - 400 K/uL  GLUCOSE, CAPILLARY     Status: Abnormal   Collection Time    02/06/13  5:06 PM      Result Value Range   Glucose-Capillary 161 (*) 70 - 99 mg/dL  GLUCOSE, CAPILLARY     Status: Abnormal   Collection Time    02/06/13  9:28 PM      Result Value Range   Glucose-Capillary 259 (*) 70 - 99 mg/dL   Comment 1 Documented in Chart     Comment 2 Notify RN    CBC     Status: Abnormal   Collection Time    02/07/13  5:00 AM      Result Value Range   WBC 24.0 (*) 4.0 - 10.5 K/uL   RBC 4.12  (*) 4.22 - 5.81 MIL/uL   Hemoglobin 12.6 (*) 13.0 - 17.0 g/dL   HCT 29.5 (*) 28.4 - 13.2 %   MCV 93.2  78.0 - 100.0 fL   MCH 30.6  26.0 - 34.0 pg   MCHC 32.8  30.0 - 36.0 g/dL   RDW 44.0  10.2 - 72.5 %   Platelets 176  150 - 400 K/uL  GLUCOSE, CAPILLARY     Status: Abnormal   Collection Time    02/07/13  7:36 AM      Result Value Range   Glucose-Capillary 217 (*) 70 - 99 mg/dL   Comment 1 Notify RN     proBNP 3722 Studies/Results: @RISRSLT24 @  Medications: Reviewed   @PROBHOSP @  1.  Atrial fibrillation  Remains in SR  Review again of PM  showed only 2 spells in 6 months  Keep on current regimen including Eliquis    Switch Dilt to 180 CD  Note pro BNP elevated.  I would give Lasix  X 1 today  Follow response  Follow labs.    2.  Pulmonary  Improving  COntinue current Rx  3  Heme  INCreased WBC probably from steroids.  Follow  4.  CAD  No symptoms to sugg active angina.  LOS: 2 days   Dietrich Pates 02/07/2013, 8:08 AM
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
## 2013-02-07 NOTE — Progress Notes (Signed)
[facility] Progress Note [place] TEAM 1 - Stepdown/ICU TEAM   ZEPPELIN COMMISSO IHK:[id number] DOB: Jul 08, 1920 DOA: 02/05/2013 PCP: [name], MD  Brief narrative: 79 y/o male with h/o A-fib, pacemaker for tachy/brady, HTN, h/o DVT, DM 2,  COPD on chronic Prednisone admitted for cough, cough with yellow sputum and left sided abdominal pain. Found to be having a COPD exacerbation with A-fib with RVR.  Had an episode of dyspnea last night which improved after breathing tx and change to a non-rebreather mask  Assessment/Plan: Principal Problem:   COPD exacerbation/ Acute bronchitis - taper steroids per pulm - cont Levaquin for acute bronchitis for total 5 days per pulm  Active Problems:   Atrial fibrillation with RVR - Paroxysmal - currently on Metoprolol home dose and additional short acting Cardizem - will change to long acting cardizem in AM - Stopped Coumadin on his own per pt 2 mo ago - cardio recommends eliquis- co pay $70/ mo-  - Xarelto copay $30-40- will switch to Xarelto- discussed with Dr Tenny Craw  Pulmonary edema - given an additional dose of Lasix per Dr Tenny Craw today    Abdominal pain - Ct scan did not reveal a pathology- resolved    Chronic steroid use - for COPD    Diabetes mellitus - uncontrolled-Hba1c is 8/9 - on Glipizide  - increased sliding scale - start lantus for now as he is on steroids    HTN (hypertension) - controlled    CAD (coronary artery disease) - stable   Code Status: DNR Family Communication: none- called wife but she did not answer Disposition Plan: d/c home tomorrow  Consultants: Card pulm   Procedures: none  Antibiotics: Levaquin- 9/24>>  DVT prophylaxis: heparin  HPI/Subjective: Pt  quite confused today- has a productive cough   Objective: Blood pressure 129/49, pulse 75, temperature 97.3 F (36.3 C), temperature source Oral, resp. rate 18, SpO2 93.00%.  Intake/Output Summary (Last 24 hours) at  02/07/13 1748 Last data filed at 02/07/13 1600  Gross per 24 hour  Intake   1040 ml  Output    350 ml  Net    690 ml     Exam: General: No acute respiratory distress Lungs: Mild rhonchi b/l  Cardiovascular: Regular rate and rhythm without murmur gallop or rub normal S1 and S2 Abdomen: Nontender, distended and tympanic with high pitched bowel sounds, no rebound, no ascites, no appreciable mass Extremities: No significant cyanosis, clubbing, or edema bilateral lower extremities  Data Reviewed: Basic Metabolic Panel:  Recent Labs Lab 02/05/13 0545 02/05/13 0548 02/05/13 1142 02/06/13 0123 02/07/13 1017  NA 138 140  --  133* 135  K 4.2 4.1  --  4.5 4.2  CL 98 100  --  95* 95*  CO2 29  --   --  23 19  GLUCOSE 198* 198*  --  345* 247*  BUN 19 19  --  24* 49*  CREATININE 1.44* 1.60* 1.36* 1.66* 2.45*  CALCIUM 9.3  --   --  8.1* 8.8   Liver Function Tests:  Recent Labs Lab 02/05/13 0545  AST 15  ALT 9  ALKPHOS 85  BILITOT 0.8  PROT 6.8  ALBUMIN 3.6   No results found for this basename: LIPASE, AMYLASE,  in the last 168 hours No results found for this basename: AMMONIA,  in the last 168 hours CBC:  Recent Labs Lab 02/05/13 0545 02/05/13 0548 02/05/13 1142 02/06/13 1445 02/07/13 0500  WBC 17.1*  --  14.7* 20.5* 24.0*  NEUTROABS 13.3*  --   --   --   --   HGB 15.4 16.0 14.3 14.4 12.6*  HCT 45.0 47.0 43.5 41.6 38.4*  MCV 93.9  --  94.0 91.6 93.2  PLT 166  --  143* 181 176   Cardiac Enzymes:  Recent Labs Lab 02/05/13 0545 02/05/13 1130 02/05/13 1648  TROPONINI <0.30 <0.30 <0.30   BNP (last 3 results)  Recent Labs  02/05/13 0545 02/06/13 0200  PROBNP 1337.0* 3722.0*   CBG:  Recent Labs Lab 02/06/13 1706 02/06/13 2128 02/07/13 0736 02/07/13 1137 02/07/13 1558  GLUCAP 161* 259* 217* 242* 303*    Recent Results (from the past 240 hour(s))  URINE CULTURE     Status: None   Collection Time    02/05/13  7:56 AM      Result Value Range  Status   Specimen Description URINE, RANDOM   Final   Special Requests ADDED AT 0809A ON 161096   Final   Culture  Setup Time     Final   Value: 02/05/2013 14:46     Performed at [facility]   Colony Count     Final   Value: NO GROWTH     Performed at [facility]   Culture     Final   Value: NO GROWTH     Performed at [facility]   Report Status 02/06/2013 FINAL   Final  CULTURE, BLOOD (ROUTINE X 2)     Status: None   Collection Time    02/05/13 11:30 AM      Result Value Range Status   Specimen Description BLOOD RIGHT FOREARM   Final   Special Requests BOTTLES DRAWN AEROBIC AND ANAEROBIC 10CC   Final   Culture  Setup Time     Final   Value: 02/05/2013 15:55     Performed at [facility]   Culture     Final   Value:        BLOOD CULTURE RECEIVED NO GROWTH TO DATE CULTURE WILL BE HELD FOR 5 DAYS BEFORE ISSUING A FINAL NEGATIVE REPORT     Performed at [facility]   Report Status PENDING   Incomplete  CULTURE, BLOOD (ROUTINE X 2)     Status: None   Collection Time    02/05/13 11:40 AM      Result Value Range Status   Specimen Description BLOOD LEFT HAND   Final   Special Requests BOTTLES DRAWN AEROBIC ONLY 3CC   Final   Culture  Setup Time     Final   Value: 02/05/2013 15:55     Performed at [facility]   Culture     Final   Value:        BLOOD CULTURE RECEIVED NO GROWTH TO DATE CULTURE WILL BE HELD FOR 5 DAYS BEFORE ISSUING A FINAL NEGATIVE REPORT     Performed at [facility]   Report Status PENDING   Incomplete  MRSA PCR SCREENING     Status: None   Collection Time    02/05/13  6:58 PM      Result Value Range Status   MRSA by PCR NEGATIVE  NEGATIVE Final   Comment:            The GeneXpert MRSA Assay (FDA     approved for NASAL specimens     only), is one component of a     comprehensive MRSA colonization  surveillance program. It is not     intended to diagnose MRSA     infection nor to guide or      monitor treatment for     MRSA infections.     Studies:  Recent x-ray studies have been reviewed in detail by the Attending Physician  Scheduled Meds:  Scheduled Meds: . atorvastatin  10 mg Oral q1800  . benzonatate  200 mg Oral TID  . ciprofloxacin  2 drop Both Eyes Q2H while awake  . clonazePAM  0.5 mg Oral QHS  . diltiazem  180 mg Oral Daily  . furosemide  40 mg Oral Daily  . glipiZIDE  7.5 mg Oral QAC breakfast  . guaiFENesin  600 mg Oral BID  . insulin aspart  0-15 Units Subcutaneous TID WC  . insulin aspart  0-5 Units Subcutaneous QHS  . insulin glargine  15 Units Subcutaneous QHS  . ipratropium  0.5 mg Nebulization QID  . levalbuterol  0.63 mg Nebulization QID  . levofloxacin (LEVAQUIN) IV  750 mg Intravenous Q48H  . methylPREDNISolone (SOLU-MEDROL) injection  60 mg Intravenous Q12H  . metoprolol succinate  25 mg Oral Daily  . sodium chloride  3 mL Intravenous Q12H   Continuous Infusions:   Time spent on care of this patient: 35 min   Calvert Cantor, MD  [facility] Office  [phone] Pager - Text Page per Loretha Stapler as per below:  On-Call/Text Page:      [email or web address]      password [email or web address]  If 7PM-7AM, please contact night-coverage [email or web address] Password [facility] 02/07/2013, 5:48 PM   LOS: 2 days

## 2013-02-07 NOTE — Progress Notes (Signed)
ANTICOAGULATION RELATED CONSULT NOTE - INITIAL   Pharmacy Consult for Apixaban --> Xarelto Indication: Afib  Allergies  Allergen Reactions  . Asa Buff (Mag [Buffered Aspirin] Nausea Only  . Ibuprofen     REACTION: causes nausea and vomitting    Patient Measurements:  Weight: 77.2kg Adjusted Body Weight:   Vital Signs: Temp: 97.3 F (36.3 C) (09/26 1556) Temp src: Oral (09/26 1556) BP: 129/49 mmHg (09/26 1556) Pulse Rate: 75 (09/26 0712) Intake/Output from previous day: 09/25 0701 - 09/26 0700 In: 629.4 [P.O.:560; I.V.:69.4] Out: 300 [Urine:300] Intake/Output from this shift: Total I/O In: 1040 [P.O.:890; IV Piggyback:150] Out: 350 [Urine:350]  Labs:  Recent Labs  02/05/13 0545  02/05/13 1142 02/05/13 1847 02/06/13 0123 02/06/13 1445 02/07/13 0500 02/07/13 1017  WBC 17.1*  --  14.7*  --   --  20.5* 24.0*  --   HGB 15.4  < > 14.3  --   --  14.4 12.6*  --   HCT 45.0  < > 43.5  --   --  41.6 38.4*  --   PLT 166  --  143*  --   --  181 176  --   APTT  --   --   --  173*  --   --   --   --   CREATININE 1.44*  < > 1.36*  --  1.66*  --   --  2.45*  ALBUMIN 3.6  --   --   --   --   --   --   --   PROT 6.8  --   --   --   --   --   --   --   AST 15  --   --   --   --   --   --   --   ALT 9  --   --   --   --   --   --   --   ALKPHOS 85  --   --   --   --   --   --   --   BILITOT 0.8  --   --   --   --   --   --   --   < > = values in this interval not displayed. The CrCl is unknown because both a height and weight (above a minimum accepted value) are required for this calculation.   Microbiology: Recent Results (from the past 720 hour(s))  URINE CULTURE     Status: None   Collection Time    02/05/13  7:56 AM      Result Value Range Status   Specimen Description URINE, RANDOM   Final   Special Requests ADDED AT 0809A ON 161096   Final   Culture  Setup Time     Final   Value: 02/05/2013 14:46     Performed at [facility]   Colony Count     Final   Value: NO GROWTH     Performed at [facility]   Culture     Final   Value: NO GROWTH     Performed at [facility]   Report Status 02/06/2013 FINAL   Final  CULTURE, BLOOD (ROUTINE X 2)     Status: None   Collection Time    02/05/13 11:30 AM      Result Value Range Status   Specimen Description BLOOD RIGHT FOREARM   Final  Special Requests BOTTLES DRAWN AEROBIC AND ANAEROBIC 10CC   Final   Culture  Setup Time     Final   Value: 02/05/2013 15:55     Performed at [facility]   Culture     Final   Value:        BLOOD CULTURE RECEIVED NO GROWTH TO DATE CULTURE WILL BE HELD FOR 5 DAYS BEFORE ISSUING A FINAL NEGATIVE REPORT     Performed at [facility]   Report Status PENDING   Incomplete  CULTURE, BLOOD (ROUTINE X 2)     Status: None   Collection Time    02/05/13 11:40 AM      Result Value Range Status   Specimen Description BLOOD LEFT HAND   Final   Special Requests BOTTLES DRAWN AEROBIC ONLY 3CC   Final   Culture  Setup Time     Final   Value: 02/05/2013 15:55     Performed at [facility]   Culture     Final   Value:        BLOOD CULTURE RECEIVED NO GROWTH TO DATE CULTURE WILL BE HELD FOR 5 DAYS BEFORE ISSUING A FINAL NEGATIVE REPORT     Performed at [facility]   Report Status PENDING   Incomplete  MRSA PCR SCREENING     Status: None   Collection Time    02/05/13  6:58 PM      Result Value Range Status   MRSA by PCR NEGATIVE  NEGATIVE Final   Comment:            The GeneXpert MRSA Assay (FDA     approved for NASAL specimens     only), is one component of a     comprehensive MRSA colonization     surveillance program. It is not     intended to diagnose MRSA     infection nor to guide or     monitor treatment for     MRSA infections.    Medical History: Past Medical History  Diagnosis Date  . Presence of permanent cardiac pacemaker 04/2006 upgrade    a. s/p MDT Adapta ADDR01 dual chamber PPM, ser #: [id number] H.  [name]  PAF (paroxysmal atrial fibrillation)     a. previously on coumadin-->patient self-d/c'd 11/2012 b/c he was tired of having INR's checked.  . Tachy-brady syndrome   . DM (diabetes mellitus)   . CAD (coronary artery disease)     DES to cx  . Pulmonary embolism 12/2003  . Deep vein thrombophlebitis of leg 2005, 2009    recurrent when off anticoag  . HTN (hypertension)   . Allergic rhinitis   . Asthma   . CKD (chronic kidney disease), stage III   . Nasal polyp     Medications:  Prescriptions prior to admission  Medication Sig Dispense Refill  . budesonide (PULMICORT) 0.25 MG/2ML nebulizer solution Use two times a day       . clonazePAM (KLONOPIN) 0.5 MG tablet Take 0.5 mg by mouth at bedtime.      . furosemide (LASIX) 40 MG tablet Take 1 tablet (40 mg total) by mouth daily.  90 tablet  1  . glipiZIDE (GLUCOTROL) 5 MG tablet Take 7.5 mg by mouth daily.      [name] HYDROcodone-acetaminophen (NORCO/VICODIN) 5-325 MG per tablet Take 0.5-1 tablets by mouth every 8 (eight) hours as needed for pain. prn  30 tablet  5  . ipratropium (ATROVENT) 0.02 % nebulizer  solution Take 500 mcg by nebulization 4 (four) times daily.        [name] LORazepam (ATIVAN) 0.5 MG tablet Take 0.5-1 mg by mouth at bedtime as needed (sleep).      . lovastatin (MEVACOR) 40 MG tablet Take 40 mg by mouth at bedtime.      . metoprolol succinate (TOPROL-XL) 25 MG 24 hr tablet Take 1 tablet (25 mg total) by mouth daily.  90 tablet  2  . Multiple Vitamins-Iron (QC DAILY MULTIVITAMINS/IRON) TABS Take 1 tablet by mouth daily.        . nitroGLYCERIN (NITROSTAT) 0.3 MG SL tablet Place 1 tablet (0.3 mg total) under the tongue every 5 (five) minutes as needed.  25 tablet  3  . PredniSONE 5 MG TBEC Take 5 mg by mouth daily.  30 tablet  5  . promethazine-codeine (PHENERGAN WITH CODEINE) 6.25-10 MG/5ML syrup Take 5 mLs by mouth every 4 (four) hours as needed for cough.        Assessment: 89yom admitted with Afib and started on apixaban. MD has  now consulted pharmacy to transition patient to rivaroxaban due to financial issues. Patient's SCr increased significantly over the last 2 days (1.66-->2.45) - CrCl (~72ml/min) is still adequate for rivaroxaban use per recommendations (>15 ml/min) but recommend to monitor renal function closely. Patient received apixaban 2.5mg  this AM ~1044 - will start rivaroxaban 15mg  daily at when the next dose of apixaban was due. - H/H and Plts wnl - No bleeding reported - LFTs wnl - CrCl ~20 ml/min  Plan:  1. Xarelto 15mg  daily with supper - first dose due tonight ~2200 2. Monitor renal function - rivaroxaban should not be used in patients with CrCl <15 ml/min  Cleon Dew [phone] 02/07/2013,6:01 PM

## 2013-02-08 ENCOUNTER — Encounter (HOSPITAL_COMMUNITY): Payer: Self-pay | Admitting: *Deleted

## 2013-02-08 ENCOUNTER — Inpatient Hospital Stay (HOSPITAL_COMMUNITY): Payer: Medicare Other

## 2013-02-08 DIAGNOSIS — I5033 Acute on chronic diastolic (congestive) heart failure: Secondary | ICD-10-CM

## 2013-02-08 DIAGNOSIS — I5032 Chronic diastolic (congestive) heart failure: Secondary | ICD-10-CM

## 2013-02-08 DIAGNOSIS — G9340 Encephalopathy, unspecified: Secondary | ICD-10-CM

## 2013-02-08 DIAGNOSIS — J9620 Acute and chronic respiratory failure, unspecified whether with hypoxia or hypercapnia: Principal | ICD-10-CM

## 2013-02-08 LAB — BASIC METABOLIC PANEL
Chloride: 95 mEq/L — ABNORMAL LOW (ref 96–112)
GFR calc Af Amer: 29 mL/min — ABNORMAL LOW (ref >90)
GFR calc non Af Amer: 25 mL/min — ABNORMAL LOW (ref >90)
Glucose, Bld: 348 mg/dL — ABNORMAL HIGH (ref 70–99)
Potassium: 4 mEq/L (ref 3.5–5.1)
Sodium: 133 mEq/L — ABNORMAL LOW (ref 135–145)

## 2013-02-08 LAB — GLUCOSE, CAPILLARY
Glucose-Capillary: 222 mg/dL — ABNORMAL HIGH (ref 70–99)
Glucose-Capillary: 226 mg/dL — ABNORMAL HIGH (ref 70–99)
Glucose-Capillary: 373 mg/dL — ABNORMAL HIGH (ref 70–99)

## 2013-02-08 LAB — CBC
HCT: 38.7 % — ABNORMAL LOW (ref 39.0–52.0)
Hemoglobin: 13.3 g/dL (ref 13.0–17.0)
MCHC: 34.4 g/dL (ref 30.0–36.0)
MCV: 91.3 fL (ref 78.0–100.0)
Platelets: 207 10*3/uL (ref 150–400)
RBC: 4.24 MIL/uL (ref 4.22–5.81)
WBC: 19.3 10*3/uL — ABNORMAL HIGH (ref 4.0–10.5)

## 2013-02-08 MED ORDER — DILTIAZEM HCL ER COATED BEADS 180 MG PO CP24
180.0000 mg | ORAL_CAPSULE | Freq: Every day | ORAL | Status: DC
  Administered 2013-02-08: 180 mg via ORAL
  Filled 2013-02-08: qty 1

## 2013-02-08 MED ORDER — METOPROLOL SUCCINATE ER 25 MG PO TB24
25.0000 mg | ORAL_TABLET | Freq: Every day | ORAL | Status: DC
  Administered 2013-02-08: 25 mg via ORAL
  Filled 2013-02-08: qty 1

## 2013-02-08 MED ORDER — DILTIAZEM HCL ER COATED BEADS 360 MG PO CP24
360.0000 mg | ORAL_CAPSULE | Freq: Every day | ORAL | Status: DC
  Administered 2013-02-09 – 2013-02-11 (×3): 360 mg via ORAL
  Filled 2013-02-08 (×3): qty 1

## 2013-02-08 MED ORDER — DILTIAZEM HCL 25 MG/5ML IV SOLN
15.0000 mg | Freq: Once | INTRAVENOUS | Status: AC
  Administered 2013-02-08: 15 mg via INTRAVENOUS
  Filled 2013-02-08: qty 5

## 2013-02-08 MED ORDER — LEVOFLOXACIN 750 MG PO TABS
750.0000 mg | ORAL_TABLET | ORAL | Status: AC
  Administered 2013-02-09: 750 mg via ORAL
  Filled 2013-02-08: qty 1

## 2013-02-08 MED ORDER — DILTIAZEM HCL ER COATED BEADS 180 MG PO CP24
180.0000 mg | ORAL_CAPSULE | Freq: Once | ORAL | Status: AC
  Administered 2013-02-08: 180 mg via ORAL
  Filled 2013-02-08: qty 1

## 2013-02-08 MED ORDER — FUROSEMIDE 10 MG/ML IJ SOLN
80.0000 mg | Freq: Two times a day (BID) | INTRAMUSCULAR | Status: DC
  Administered 2013-02-08 – 2013-02-09 (×2): 80 mg via INTRAVENOUS
  Filled 2013-02-08 (×4): qty 8

## 2013-02-08 MED ORDER — TORSEMIDE 20 MG PO TABS: 20.0000 mg | ORAL_TABLET | Freq: Two times a day (BID) | ORAL | Status: DC

## 2013-02-08 MED ORDER — POTASSIUM CHLORIDE ER 10 MEQ PO TBCR
20.0000 meq | EXTENDED_RELEASE_TABLET | Freq: Every day | ORAL | Status: DC
  Administered 2013-02-08 – 2013-02-11 (×4): 20 meq via ORAL
  Filled 2013-02-08 (×5): qty 2

## 2013-02-08 MED ORDER — INSULIN GLARGINE 100 UNIT/ML ~~LOC~~ SOLN
20.0000 [IU] | Freq: Every day | SUBCUTANEOUS | Status: DC
  Administered 2013-02-08 – 2013-02-09 (×2): 20 [IU] via SUBCUTANEOUS
  Filled 2013-02-08 (×3): qty 0.2

## 2013-02-08 MED ORDER — OLOPATADINE HCL 0.1 % OP SOLN
1.0000 [drp] | Freq: Two times a day (BID) | OPHTHALMIC | Status: DC
  Administered 2013-02-08 – 2013-02-11 (×6): 1 [drp] via OPHTHALMIC
  Filled 2013-02-08 (×2): qty 5

## 2013-02-08 MED ORDER — METOPROLOL TARTRATE 1 MG/ML IV SOLN: 5.0000 mg | Freq: Once | INTRAVENOUS | Status: AC

## 2013-02-08 MED ORDER — METOPROLOL TARTRATE 1 MG/ML IV SOLN
INTRAVENOUS | Status: AC
  Administered 2013-02-08: 5 mg
  Filled 2013-02-08: qty 5

## 2013-02-08 MED ORDER — PREDNISONE 20 MG PO TABS
40.0000 mg | ORAL_TABLET | Freq: Every day | ORAL | Status: DC
  Administered 2013-02-09: 40 mg via ORAL
  Filled 2013-02-08 (×2): qty 2

## 2013-02-08 NOTE — Progress Notes (Addendum)
[facility] Progress Note  TEAM 1 - Stepdown/ICU TEAM   [name] ZOX:[id number] DOB: 11/22/1920 DOA: 02/05/2013 PCP: [name], MD  Brief narrative: 79 y/o male with h/o A-fib, pacemaker for tachy/brady, HTN, h/o DVT, DM 2,  COPD on chronic Prednisone admitted for cough, cough with yellow sputum and left sided abdominal pain. Found to be having a COPD exacerbation with A-fib with RVR.    Assessment/Plan: Principal Problem:   COPD exacerbation/ Acute bronchitis - taper steroids  - cont Levaquin for acute bronchitis for total 5 days per pulm (9/28)  Active Problems:   Atrial fibrillation with RVR - Paroxysmal - currently on Metoprolol - cards increasing oral Cardizem - I gave an extra dose of Cardizem IV this AM but did not help- then gave Metoprolol 5 mg IV - Stopped Coumadin on his own per pt 2 mo ago - wife states he could not go for his blood draws - cardio recommends eliquis- co pay $70/ mo-  - Xarelto copay $30-40- will switch to Xarelto- discussed with Dr Tenny Craw  Pulmonary edema - management per cards  Confusion/ Encephalopathy - started acutely week or two prior to admission per wife- was very sharp with a good memory prior to that - head CT ordered today- negative - MRI cannot be done due to pacemaker - possible frontal infarct (off of coumadin) - TSH normal - check B12 and RPR    Abdominal pain - Ct scan did not reveal a pathology- resolved    Chronic steroid use - for COPD    Diabetes mellitus - uncontrolled-Hba1c is 8/9 - on Glipizide  - increased sliding scale - Increase lantus today (sterids)  Drainage from eyes - likely allergic- stop CIpro drops and start Patanol and follow    HTN (hypertension) - controlled    CAD (coronary artery disease) - stable   Code Status: DNR Family Communication: spoke with wife and daughter Disposition Plan: follow in SDU due to RVR  Consultants: Card pulm    Procedures: none  Antibiotics: Levaquin- 9/24>>  DVT prophylaxis: heparin  HPI/Subjective: Still quite confused- continues to ask about eye drops- noted to have cipro drops ordered- wife states that he finished a course of eye drops but never went to the opthalmologic for a follow up- she states he always drains fluid from his eyes and nose...   Objective: Blood pressure 147/60, pulse 94, temperature 98.6 F (37 C), temperature source Oral, resp. rate 27, SpO2 96.00%.  Intake/Output Summary (Last 24 hours) at 02/08/13 1505 Last data filed at 02/08/13 0600  Gross per 24 hour  Intake    660 ml  Output    625 ml  Net     35 ml     Exam: General: No acute respiratory distress Lungs: Mild rhonchi b/l  Cardiovascular: Irregular rate and rhythm without murmur  Abdomen: Nontender, distended and tympanic with high pitched bowel sounds, no rebound, no ascites, no appreciable mass Extremities: No significant cyanosis, clubbing, or edema bilateral lower extremities  Data Reviewed: Basic Metabolic Panel:  Recent Labs Lab 02/05/13 0545 02/05/13 0548 02/05/13 1142 02/06/13 0123 02/07/13 1017 02/08/13 0830  NA 138 140  --  133* 135 133*  K 4.2 4.1  --  4.5 4.2 4.0  CL 98 100  --  95* 95* 95*  CO2 29  --   --  23 19 21   GLUCOSE 198* 198*  --  345* 247* 348*  BUN 19 19  --  24* 49*  55*  CREATININE 1.44* 1.60* 1.36* 1.66* 2.45* 2.19*  CALCIUM 9.3  --   --  8.1* 8.8 8.9   Liver Function Tests:  Recent Labs Lab 02/05/13 0545  AST 15  ALT 9  ALKPHOS 85  BILITOT 0.8  PROT 6.8  ALBUMIN 3.6   No results found for this basename: LIPASE, AMYLASE,  in the last 168 hours No results found for this basename: AMMONIA,  in the last 168 hours CBC:  Recent Labs Lab 02/05/13 0545 02/05/13 0548 02/05/13 1142 02/06/13 1445 02/07/13 0500 02/08/13 0630  WBC 17.1*  --  14.7* 20.5* 24.0* 19.3*  NEUTROABS 13.3*  --   --   --   --   --   HGB 15.4 16.0 14.3 14.4 12.6* 13.3  HCT  45.0 47.0 43.5 41.6 38.4* 38.7*  MCV 93.9  --  94.0 91.6 93.2 91.3  PLT 166  --  143* 181 176 207   Cardiac Enzymes:  Recent Labs Lab 02/05/13 0545 02/05/13 1130 02/05/13 1648  TROPONINI <0.30 <0.30 <0.30   BNP (last 3 results)  Recent Labs  02/05/13 0545 02/06/13 0200 02/08/13 0630  PROBNP 1337.0* 3722.0* 5645.0*   CBG:  Recent Labs Lab 02/07/13 1137 02/07/13 1558 02/07/13 2145 02/08/13 0748 02/08/13 1217  GLUCAP 242* 303* 273* 378* 222*    Recent Results (from the past 240 hour(s))  URINE CULTURE     Status: None   Collection Time    02/05/13  7:56 AM      Result Value Range Status   Specimen Description URINE, RANDOM   Final   Special Requests ADDED AT 0809A ON [id number]   Final   Culture  Setup Time     Final   Value: 02/05/2013 14:46     Performed at [facility]   Colony Count     Final   Value: NO GROWTH     Performed at [facility]   Culture     Final   Value: NO GROWTH     Performed at [facility]   Report Status 02/06/2013 FINAL   Final  CULTURE, BLOOD (ROUTINE X 2)     Status: None   Collection Time    02/05/13 11:30 AM      Result Value Range Status   Specimen Description BLOOD RIGHT FOREARM   Final   Special Requests BOTTLES DRAWN AEROBIC AND ANAEROBIC 10CC   Final   Culture  Setup Time     Final   Value: 02/05/2013 15:55     Performed at [facility]   Culture     Final   Value:        BLOOD CULTURE RECEIVED NO GROWTH TO DATE CULTURE WILL BE HELD FOR 5 DAYS BEFORE ISSUING A FINAL NEGATIVE REPORT     Performed at [facility]   Report Status PENDING   Incomplete  CULTURE, BLOOD (ROUTINE X 2)     Status: None   Collection Time    02/05/13 11:40 AM      Result Value Range Status   Specimen Description BLOOD LEFT HAND   Final   Special Requests BOTTLES DRAWN AEROBIC ONLY 3CC   Final   Culture  Setup Time     Final   Value: 02/05/2013 15:55     Performed at [facility]   Culture     Final    Value:        BLOOD CULTURE RECEIVED NO GROWTH  TO DATE CULTURE WILL BE HELD FOR 5 DAYS BEFORE ISSUING A FINAL NEGATIVE REPORT     Performed at [facility]   Report Status PENDING   Incomplete  MRSA PCR SCREENING     Status: None   Collection Time    02/05/13  6:58 PM      Result Value Range Status   MRSA by PCR NEGATIVE  NEGATIVE Final   Comment:            The GeneXpert MRSA Assay (FDA     approved for NASAL specimens     only), is one component of a     comprehensive MRSA colonization     surveillance program. It is not     intended to diagnose MRSA     infection nor to guide or     monitor treatment for     MRSA infections.     Studies:  Recent x-ray studies have been reviewed in detail by the Attending Physician  Scheduled Meds:  Scheduled Meds: . atorvastatin  10 mg Oral q1800  . benzonatate  200 mg Oral TID  . clonazePAM  0.5 mg Oral QHS  . diltiazem  180 mg Oral Once  . [START ON 02/09/2013] diltiazem  360 mg Oral Daily  . furosemide  80 mg Intravenous BID  . glipiZIDE  7.5 mg Oral QAC breakfast  . guaiFENesin  600 mg Oral BID  . insulin aspart  0-15 Units Subcutaneous TID WC  . insulin aspart  0-5 Units Subcutaneous QHS  . insulin glargine  20 Units Subcutaneous QHS  . ipratropium  0.5 mg Nebulization QID  . levalbuterol  0.63 mg Nebulization QID  . [START ON 02/09/2013] levofloxacin  750 mg Oral Q48H  . olopatadine  1 drop Both Eyes BID  . potassium chloride  20 mEq Oral Daily  . [START ON 02/09/2013] predniSONE  40 mg Oral Q breakfast  . rivaroxaban  15 mg Oral Q supper  . sodium chloride  3 mL Intravenous Q12H   Continuous Infusions:   Time spent on care of this patient: 35 min   Calvert Cantor, MD  [facility] Office  [phone] Pager - Text Page per Loretha Stapler as per below:  On-Call/Text Page:      [email or web address]      password [email or web address]  If 7PM-7AM, please contact night-coverage [email or web address] Password [email or web address] 02/08/2013, 3:05 PM   LOS: 3  days

## 2013-02-08 NOTE — Progress Notes (Addendum)
Subjective:  Intermittently confused but better when I saw him. Had head CT this am - nothing acute.  Denies dyspnea. In and out of AF with RVR today.  I/Os positive. No daily weights.  Objective: Filed Vitals:   02/07/13 2300 02/08/13 0000 02/08/13 0400 02/08/13 0748  BP: 137/82  147/60   Pulse: 100 94    Temp:  97.7 F (36.5 C) 98 F (36.7 C) 98.6 F (37 C)  TempSrc:  Oral Oral Oral  Resp: 18 23 27    SpO2: 91% 97% 96%    Weight change:   Intake/Output Summary (Last 24 hours) at 02/08/13 1224 Last data filed at 02/08/13 0600  Gross per 24 hour  Intake   1210 ml  Output    975 ml  Net    235 ml    General: Alert, awake, conversant  in no acute distress Neck:  JVP 10 Heart: Regular rate and rhythm, without murmurs, rubs, gallops.  Lungs: Moving air  Wheezes bilaterally Abdomen: distended. Nontender. Good BS Exemities:  No edema.   Neuro: Grossly intact, nonfocal.   Tele:  In/out AF with RVR up to 150 and SR  Lab Results: Results for orders placed during the hospital encounter of 02/05/13 (from the past 24 hour(s))  GLUCOSE, CAPILLARY     Status: Abnormal   Collection Time    02/07/13  3:58 PM      Result Value Range   Glucose-Capillary 303 (*) 70 - 99 mg/dL  GLUCOSE, CAPILLARY     Status: Abnormal   Collection Time    02/07/13  9:45 PM      Result Value Range   Glucose-Capillary 273 (*) 70 - 99 mg/dL  CBC     Status: Abnormal   Collection Time    02/08/13  6:30 AM      Result Value Range   WBC 19.3 (*) 4.0 - 10.5 K/uL   RBC 4.24  4.22 - 5.81 MIL/uL   Hemoglobin 13.3  13.0 - 17.0 g/dL   HCT 16.1 (*) 09.6 - 04.5 %   MCV 91.3  78.0 - 100.0 fL   MCH 31.4  26.0 - 34.0 pg   MCHC 34.4  30.0 - 36.0 g/dL   RDW 40.9  81.1 - 91.4 %   Platelets 207  150 - 400 K/uL  PRO B NATRIURETIC PEPTIDE     Status: Abnormal   Collection Time    02/08/13  6:30 AM      Result Value Range   Pro B Natriuretic peptide (BNP) 5645.0 (*) 0 - 450 pg/mL  GLUCOSE, CAPILLARY      Status: Abnormal   Collection Time    02/08/13  7:48 AM      Result Value Range   Glucose-Capillary 378 (*) 70 - 99 mg/dL   Comment 1 Notify RN     Comment 2 Documented in Chart    BASIC METABOLIC PANEL     Status: Abnormal   Collection Time    02/08/13  8:30 AM      Result Value Range   Sodium 133 (*) 135 - 145 mEq/L   Potassium 4.0  3.5 - 5.1 mEq/L   Chloride 95 (*) 96 - 112 mEq/L   CO2 21  19 - 32 mEq/L   Glucose, Bld 348 (*) 70 - 99 mg/dL   BUN 55 (*) 6 - 23 mg/dL   Creatinine, Ser 7.82 (*) 0.50 - 1.35 mg/dL   Calcium 8.9  8.4 - 95.6 mg/dL  GFR calc non Af Amer 25 (*) >90 mL/min   GFR calc Af Amer 29 (*) >90 mL/min  GLUCOSE, CAPILLARY     Status: Abnormal   Collection Time    02/08/13 12:17 PM      Result Value Range   Glucose-Capillary 222 (*) 70 - 99 mg/dL   Comment 1 Notify RN     Comment 2 Documented in Chart     proBNP 3722 Studies/Results:   Medications: Reviewed   Assessment/Plan  1.  Atrial fibrillation. He is in and out of AF and SR. HRs high at times. Suspect this is driven by COPD flare. Will increase dilt to 360 daily. Continue Xarelto.   2. Acute/chronic diastolic HF- Volume status remains elevated. Continue diuretics. Start daily weights  3. Acute on chronic respiratory failure due to COPD flare -  Improving  COntinue current Rx  4.  CAD  No symptoms to sugg active angina.   LOS: 3 days   Arvilla Meres MD 02/08/2013, 12:24 PM

## 2013-02-09 DIAGNOSIS — J9620 Acute and chronic respiratory failure, unspecified whether with hypoxia or hypercapnia: Principal | ICD-10-CM

## 2013-02-09 LAB — BASIC METABOLIC PANEL
BUN: 60 mg/dL — ABNORMAL HIGH (ref 6–23)
CO2: 25 mEq/L (ref 19–32)
Calcium: 8.2 mg/dL — ABNORMAL LOW (ref 8.4–10.5)
Chloride: 101 mEq/L (ref 96–112)
Creatinine, Ser: 2.17 mg/dL — ABNORMAL HIGH (ref 0.50–1.35)
Potassium: 3.8 mEq/L (ref 3.5–5.1)

## 2013-02-09 LAB — CBC
HCT: 39 % (ref 39.0–52.0)
MCH: 31.8 pg (ref 26.0–34.0)
MCHC: 34.4 g/dL (ref 30.0–36.0)
MCV: 92.4 fL (ref 78.0–100.0)
Platelets: 201 10*3/uL (ref 150–400)
RBC: 4.22 MIL/uL (ref 4.22–5.81)
WBC: 17.2 10*3/uL — ABNORMAL HIGH (ref 4.0–10.5)

## 2013-02-09 LAB — GLUCOSE, CAPILLARY
Glucose-Capillary: 265 mg/dL — ABNORMAL HIGH (ref 70–99)
Glucose-Capillary: 218 mg/dL — ABNORMAL HIGH (ref 70–99)
Glucose-Capillary: 211 mg/dL — ABNORMAL HIGH (ref 70–99)
Glucose-Capillary: 283 mg/dL — ABNORMAL HIGH (ref 70–99)

## 2013-02-09 MED ORDER — PREDNISONE 20 MG PO TABS
20.0000 mg | ORAL_TABLET | Freq: Every day | ORAL | Status: DC
  Administered 2013-02-10 – 2013-02-11 (×2): 20 mg via ORAL
  Filled 2013-02-09 (×3): qty 1

## 2013-02-09 MED ORDER — IPRATROPIUM BROMIDE 0.02 % IN SOLN: 0.5000 mg | Freq: Four times a day (QID) | RESPIRATORY_TRACT | Status: DC

## 2013-02-09 MED ORDER — FUROSEMIDE 40 MG PO TABS
40.0000 mg | ORAL_TABLET | Freq: Every day | ORAL | Status: DC
  Administered 2013-02-10 – 2013-02-11 (×2): 40 mg via ORAL
  Filled 2013-02-09 (×2): qty 1

## 2013-02-09 NOTE — Progress Notes (Signed)
[facility] Progress Note [place] TEAM 1 - Stepdown/ICU TEAM   [name] ZOX:[id number] DOB: 1920/11/27 DOA: 02/05/2013 PCP: [name], MD  Brief narrative: 79 y/o male with h/o A-fib, pacemaker for tachy/brady, HTN, h/o DVT, DM 2,  COPD on chronic Prednisone admitted for cough, cough with yellow sputum and left sided abdominal pain. Found to be having a COPD exacerbation with A-fib with RVR.    Assessment/Plan: Principal Problem:   COPD exacerbation/ Acute bronchitis - taper steroids - resume pulmicort - significant ronchi and wheeze in RLL- add flutter valve and Atrovent nebs - Levaquin for acute bronchitis for total 5 days per pulm (9/28)  Active Problems:   Atrial fibrillation with RVR - Paroxysmal - was initially only on Metoprolol - cards increased oral Cardizem from 180 to 360 - Stopped Coumadin on his own per pt 2 mo ago - wife states he could not go for his blood draws - cardio recommends eliquis- co pay $70/ mo-  - Xarelto copay $30-40- switched to Xarelto- discussed with Dr Tenny Craw  Pulmonary edema - management per cards  Confusion/ Encephalopathy - started acutely week or two prior to admission per wife- was very sharp with a good memory prior to that - head CT ordered- negative - MRI cannot be done due to pacemaker - possible frontal infarct (was off of coumadin) - TSH normal -  B12 and RPR - normal - wife comfortable with taking him home w her- home health recommends HHPT- will add social worker and aid.     Abdominal pain - Ct scan did not reveal a pathology- resolved    Chronic steroid use - for COPD    Diabetes mellitus - uncontrolled-Hba1c is 8/9 - on Glipizide  - increased sliding scale - Increase lantus today (sterids)  Drainage from eyes - likely allergic- stop CIpro drops and start Patanol and follow    HTN (hypertension) - controlled    CAD (coronary artery disease) - stable   Code Status: DNR Family  Communication: spoke with wife and daughter Disposition Plan: transfer to tele- increase ambulation - watch for recurrence of a-fib, f/u on ambulatory pulse ox- discussed with family that he may need home O2  Consultants: Card pulm   Procedures: none  Antibiotics: Levaquin- 9/24>>  DVT prophylaxis: heparin  HPI/Subjective: Still quite confused- discusses results of blood work with daughter.   Objective: Blood pressure 129/65, pulse 75, temperature 98.1 F (36.7 C), temperature source Axillary, resp. rate 22, height 5\' 8"  (1.727 m), weight 74.6 kg (164 lb 7.4 oz), SpO2 92.00%.  Intake/Output Summary (Last 24 hours) at 02/09/13 1629 Last data filed at 02/09/13 1300  Gross per 24 hour  Intake    460 ml  Output    825 ml  Net   -365 ml     Exam: General: No acute respiratory distress Lungs: Mild rhonchi b/l  Cardiovascular: Irregular rate and rhythm without murmur  Abdomen: Nontender, distended and tympanic with high pitched bowel sounds, no rebound, no ascites, no appreciable mass Extremities: No significant cyanosis, clubbing, or edema bilateral lower extremities  Data Reviewed: Basic Metabolic Panel:  Recent Labs Lab 02/05/13 0545 02/05/13 0548 02/05/13 1142 02/06/13 0123 02/07/13 1017 02/08/13 0830 02/09/13 0410  NA 138 140  --  133* 135 133* 139  K 4.2 4.1  --  4.5 4.2 4.0 3.8  CL 98 100  --  95* 95* 95* 101  CO2 29  --   --  23 19 21  25  GLUCOSE 198* 198*  --  345* 247* 348* 276*  BUN 19 19  --  24* 49* 55* 60*  CREATININE 1.44* 1.60* 1.36* 1.66* 2.45* 2.19* 2.17*  CALCIUM 9.3  --   --  8.1* 8.8 8.9 8.2*   Liver Function Tests:  Recent Labs Lab 02/05/13 0545  AST 15  ALT 9  ALKPHOS 85  BILITOT 0.8  PROT 6.8  ALBUMIN 3.6   No results found for this basename: LIPASE, AMYLASE,  in the last 168 hours No results found for this basename: AMMONIA,  in the last 168 hours CBC:  Recent Labs Lab 02/05/13 0545  02/05/13 1142 02/06/13 1445  02/07/13 0500 02/08/13 0630 02/09/13 0410  WBC 17.1*  --  14.7* 20.5* 24.0* 19.3* 17.2*  NEUTROABS 13.3*  --   --   --   --   --   --   HGB 15.4  < > 14.3 14.4 12.6* 13.3 13.4  HCT 45.0  < > 43.5 41.6 38.4* 38.7* 39.0  MCV 93.9  --  94.0 91.6 93.2 91.3 92.4  PLT 166  --  143* 181 176 207 201  < > = values in this interval not displayed. Cardiac Enzymes:  Recent Labs Lab 02/05/13 0545 02/05/13 1130 02/05/13 1648  TROPONINI <0.30 <0.30 <0.30   BNP (last 3 results)  Recent Labs  02/05/13 0545 02/06/13 0200 02/08/13 0630  PROBNP 1337.0* 3722.0* 5645.0*   CBG:  Recent Labs Lab 02/08/13 1702 02/08/13 2157 02/09/13 0719 02/09/13 1126 02/09/13 1215  GLUCAP 226* 373* 265* 218* 211*    Recent Results (from the past 240 hour(s))  URINE CULTURE     Status: None   Collection Time    02/05/13  7:56 AM      Result Value Range Status   Specimen Description URINE, RANDOM   Final   Special Requests ADDED AT 0809A ON 161096   Final   Culture  Setup Time     Final   Value: 02/05/2013 14:46     Performed at [facility]   Colony Count     Final   Value: NO GROWTH     Performed at [facility]   Culture     Final   Value: NO GROWTH     Performed at [facility]   Report Status 02/06/2013 FINAL   Final  CULTURE, BLOOD (ROUTINE X 2)     Status: None   Collection Time    02/05/13 11:30 AM      Result Value Range Status   Specimen Description BLOOD RIGHT FOREARM   Final   Special Requests BOTTLES DRAWN AEROBIC AND ANAEROBIC 10CC   Final   Culture  Setup Time     Final   Value: 02/05/2013 15:55     Performed at [facility]   Culture     Final   Value:        BLOOD CULTURE RECEIVED NO GROWTH TO DATE CULTURE WILL BE HELD FOR 5 DAYS BEFORE ISSUING A FINAL NEGATIVE REPORT     Performed at [facility]   Report Status PENDING   Incomplete  CULTURE, BLOOD (ROUTINE X 2)     Status: None   Collection Time    02/05/13 11:40 AM      Result  Value Range Status   Specimen Description BLOOD LEFT HAND   Final   Special Requests BOTTLES DRAWN AEROBIC ONLY 3CC   Final   Culture  Setup  Time     Final   Value: 02/05/2013 15:55     Performed at [facility]   Culture     Final   Value:        BLOOD CULTURE RECEIVED NO GROWTH TO DATE CULTURE WILL BE HELD FOR 5 DAYS BEFORE ISSUING A FINAL NEGATIVE REPORT     Performed at [facility]   Report Status PENDING   Incomplete  MRSA PCR SCREENING     Status: None   Collection Time    02/05/13  6:58 PM      Result Value Range Status   MRSA by PCR NEGATIVE  NEGATIVE Final   Comment:            The GeneXpert MRSA Assay (FDA     approved for NASAL specimens     only), is one component of a     comprehensive MRSA colonization     surveillance program. It is not     intended to diagnose MRSA     infection nor to guide or     monitor treatment for     MRSA infections.     Studies:  Recent x-ray studies have been reviewed in detail by the Attending Physician  Scheduled Meds:  Scheduled Meds: . atorvastatin  10 mg Oral q1800  . benzonatate  200 mg Oral TID  . clonazePAM  0.5 mg Oral QHS  . diltiazem  360 mg Oral Daily  . furosemide  40 mg Oral Daily  . glipiZIDE  7.5 mg Oral QAC breakfast  . guaiFENesin  600 mg Oral BID  . insulin aspart  0-15 Units Subcutaneous TID WC  . insulin aspart  0-5 Units Subcutaneous QHS  . insulin glargine  20 Units Subcutaneous QHS  . ipratropium  0.5 mg Nebulization QID  . levalbuterol  0.63 mg Nebulization QID  . olopatadine  1 drop Both Eyes BID  . potassium chloride  20 mEq Oral Daily  . predniSONE  40 mg Oral Q breakfast  . rivaroxaban  15 mg Oral Q supper  . sodium chloride  3 mL Intravenous Q12H   Continuous Infusions:   Time spent on care of this patient: 35 min   Calvert Cantor, MD  [facility] Office  [phone] Pager - Text Page per Loretha Stapler as per below:  On-Call/Text Page:      [email or web address]      password  [email or web address]  If 7PM-7AM, please contact night-coverage [email or web address] Password [facility] 02/09/2013, 4:29 PM   LOS: 4 days

## 2013-02-09 NOTE — Progress Notes (Signed)
Pt ambulated in hallway 150 with assist X 2 with rolling walker. Pt unsteady on feet. Pt assisted back to bed, pt audibily  Wheezing and pt c/o SOB and coughing up yellow sputumn. PRN Breathing treatment started. Will continue to monitor.

## 2013-02-09 NOTE — Progress Notes (Signed)
Subjective:  Remains confused.  Denies dyspnea says he is going home as soon as his wife gets here. HR currently in 90s. In/out of AF  Diuresed with IV lasix yesterday. I/Os slightly negative (? Accurate) No daily weights.  Objective: Filed Vitals:   02/09/13 0000 02/09/13 0400 02/09/13 0720 02/09/13 0834  BP: 124/56 137/57 137/70   Pulse: 84 81    Temp: 98.2 F (36.8 C) 97.5 F (36.4 C) 98.3 F (36.8 C)   TempSrc: Oral Oral Oral   Resp: 20 24 29    Height:      Weight:      SpO2: 94% 95% 96% 97%   Weight change:   Intake/Output Summary (Last 24 hours) at 02/09/13 0945 Last data filed at 02/09/13 0256  Gross per 24 hour  Intake    560 ml  Output   1350 ml  Net   -790 ml    General: Alert, awake, conversant  in no acute distress Neck:  JVP 6 Heart: Regular rate and rhythm, without murmurs, rubs, gallops.  Lungs: Moving air ok mild end exp wheeze. Abdomen: distended. Nontender. Good BS Exemities:  No edema.   Neuro: Grossly intact, nonfocal.   Tele:  In/out AF HR 90s  Lab Results: Results for orders placed during the hospital encounter of 02/05/13 (from the past 24 hour(s))  GLUCOSE, CAPILLARY     Status: Abnormal   Collection Time    02/08/13 12:17 PM      Result Value Range   Glucose-Capillary 222 (*) 70 - 99 mg/dL   Comment 1 Notify RN     Comment 2 Documented in Chart    GLUCOSE, CAPILLARY     Status: Abnormal   Collection Time    02/08/13  5:02 PM      Result Value Range   Glucose-Capillary 226 (*) 70 - 99 mg/dL   Comment 1 Notify RN     Comment 2 Documented in Chart    GLUCOSE, CAPILLARY     Status: Abnormal   Collection Time    02/08/13  9:57 PM      Result Value Range   Glucose-Capillary 373 (*) 70 - 99 mg/dL   Comment 1 Notify RN    CBC     Status: Abnormal   Collection Time    02/09/13  4:10 AM      Result Value Range   WBC 17.2 (*) 4.0 - 10.5 K/uL   RBC 4.22  4.22 - 5.81 MIL/uL   Hemoglobin 13.4  13.0 - 17.0 g/dL   HCT 16.1  09.6 - 04.5 %    MCV 92.4  78.0 - 100.0 fL   MCH 31.8  26.0 - 34.0 pg   MCHC 34.4  30.0 - 36.0 g/dL   RDW 40.9  81.1 - 91.4 %   Platelets 201  150 - 400 K/uL  BASIC METABOLIC PANEL     Status: Abnormal   Collection Time    02/09/13  4:10 AM      Result Value Range   Sodium 139  135 - 145 mEq/L   Potassium 3.8  3.5 - 5.1 mEq/L   Chloride 101  96 - 112 mEq/L   CO2 25  19 - 32 mEq/L   Glucose, Bld 276 (*) 70 - 99 mg/dL   BUN 60 (*) 6 - 23 mg/dL   Creatinine, Ser 7.82 (*) 0.50 - 1.35 mg/dL   Calcium 8.2 (*) 8.4 - 10.5 mg/dL   GFR calc non Af  Amer 25 (*) >90 mL/min   GFR calc Af Amer 29 (*) >90 mL/min  GLUCOSE, CAPILLARY     Status: Abnormal   Collection Time    02/09/13  7:19 AM      Result Value Range   Glucose-Capillary 265 (*) 70 - 99 mg/dL   proBNP 1610 Studies/Results:   Medications: Reviewed   Assessment/Plan  1.  Atrial fibrillation. He is in and out of AF and SR. HR much better controlled on dilt 360. Will continue. Off b-blocker due to COPD.   2. Acute/chronic diastolic HF- Volume status much improved. Switch to po lasix.  3. Acute on chronic respiratory failure due to COPD flare -  Improving  COntinue current Rx  4.  CAD  No symptoms to sugg active angina.   LOS: 4 days   Arvilla Meres MD 02/09/2013, 9:45 AM

## 2013-02-10 LAB — GLUCOSE, CAPILLARY
Glucose-Capillary: 312 mg/dL — ABNORMAL HIGH (ref 70–99)
Glucose-Capillary: 326 mg/dL — ABNORMAL HIGH (ref 70–99)
Glucose-Capillary: 284 mg/dL — ABNORMAL HIGH (ref 70–99)

## 2013-02-10 LAB — CBC
HCT: 37.3 % — ABNORMAL LOW (ref 39.0–52.0)
MCH: 30.9 pg (ref 26.0–34.0)
MCHC: 33.5 g/dL (ref 30.0–36.0)
MCV: 92.1 fL (ref 78.0–100.0)
Platelets: 189 10*3/uL (ref 150–400)
RDW: 13.5 % (ref 11.5–15.5)
WBC: 15.6 10*3/uL — ABNORMAL HIGH (ref 4.0–10.5)

## 2013-02-10 MED ORDER — BUDESONIDE 0.25 MG/2ML IN SUSP
0.2500 mg | Freq: Two times a day (BID) | RESPIRATORY_TRACT | Status: DC
  Administered 2013-02-10: 0.25 mg via RESPIRATORY_TRACT
  Filled 2013-02-10 (×5): qty 2

## 2013-02-10 MED ORDER — INSULIN GLARGINE 100 UNIT/ML ~~LOC~~ SOLN
26.0000 [IU] | Freq: Every day | SUBCUTANEOUS | Status: DC
  Administered 2013-02-10: 26 [IU] via SUBCUTANEOUS
  Filled 2013-02-10 (×2): qty 0.26

## 2013-02-10 MED ORDER — METOPROLOL TARTRATE 12.5 MG HALF TABLET
12.5000 mg | ORAL_TABLET | Freq: Two times a day (BID) | ORAL | Status: DC
  Administered 2013-02-10 – 2013-02-11 (×2): 12.5 mg via ORAL
  Filled 2013-02-10 (×3): qty 1

## 2013-02-10 NOTE — Progress Notes (Signed)
Patient Name: [name]      SUBJECTIVE: admitted with COPD flare aggravated by PAF and RVR treated with bisoprolol and uptitration of his CCB Apart from hosp device interrogation demonstrated relatively infrequent Afib  Also hx of PE and DVT; had stopped coumadin but is now   on Rivaroxaban   ddenies sob  waliking in the halls  Still wheezing  Past Medical History  Diagnosis Date  . Presence of permanent cardiac pacemaker 04/2006 upgrade    a. s/p MDT Adapta ADDR01 dual chamber PPM, ser #: [id number] H.  [name] PAF (paroxysmal atrial fibrillation)     a. previously on coumadin-->patient self-d/c'd 11/2012 b/c he was tired of having INR's checked.  . Tachy-brady syndrome   . DM (diabetes mellitus)   . CAD (coronary artery disease)     DES to cx  . Pulmonary embolism 12/2003  . Deep vein thrombophlebitis of leg 2005, 2009    recurrent when off anticoag  . HTN (hypertension)   . Allergic rhinitis   . Asthma   . CKD (chronic kidney disease), stage III   . Nasal polyp     Scheduled Meds:  Scheduled Meds: . atorvastatin  10 mg Oral q1800  . benzonatate  200 mg Oral TID  . clonazePAM  0.5 mg Oral QHS  . diltiazem  360 mg Oral Daily  . furosemide  40 mg Oral Daily  . glipiZIDE  7.5 mg Oral QAC breakfast  . guaiFENesin  600 mg Oral BID  . insulin aspart  0-15 Units Subcutaneous TID WC  . insulin aspart  0-5 Units Subcutaneous QHS  . insulin glargine  20 Units Subcutaneous QHS  . ipratropium  0.5 mg Nebulization QID  . levalbuterol  0.63 mg Nebulization QID  . olopatadine  1 drop Both Eyes BID  . potassium chloride  20 mEq Oral Daily  . predniSONE  20 mg Oral Q breakfast  . rivaroxaban  15 mg Oral Q supper  . sodium chloride  3 mL Intravenous Q12H   Continuous Infusions:   PHYSICAL EXAM Filed Vitals:   02/09/13 1707 02/09/13 1929 02/09/13 1937 02/10/13 0428  BP:  144/66  138/60  Pulse:  80  100  Temp:  97.9 F (36.6 C)  97.8 F (36.6 C)  TempSrc:  Oral  Oral    Resp:  21  20  Height:      Weight:    162 lb 7.7 oz (73.7 kg)  SpO2: 98% 95% 98% 96%  Well developed and nourished in mod resp distress HENT normal Neck supple   wheeazes Irregularly irregular rate and rhythm with controlled ventricular response, no murmurs or gallops Abd-soft with active BS without hepatomegaly No Clubbing cyanosis tr edema Skin-warm and dry A & Oriented  Grossly normal sensory and motor function   TELEMETRY: Reviewed telemetry pt in *afib at HR 100    Intake/Output Summary (Last 24 hours) at 02/10/13 0938 Last data filed at 02/10/13 0651  Gross per 24 hour  Intake    240 ml  Output    625 ml  Net   -385 ml    LABS: Basic Metabolic Panel:  Recent Labs Lab 02/05/13 0545 02/05/13 0548 02/05/13 1142 02/06/13 0123 02/07/13 1017 02/08/13 0830 02/09/13 0410  NA 138 140  --  133* 135 133* 139  K 4.2 4.1  --  4.5 4.2 4.0 3.8  CL 98 100  --  95* 95* 95* 101  CO2 29  --   --  23 19 21 25   GLUCOSE 198* 198*  --  345* 247* 348* 276*  BUN 19 19  --  24* 49* 55* 60*  CREATININE 1.44* 1.60* 1.36* 1.66* 2.45* 2.19* 2.17*  CALCIUM 9.3  --   --  8.1* 8.8 8.9 8.2*   Cardiac Enzymes: No results found for this basename: CKTOTAL, CKMB, CKMBINDEX, TROPONINI,  in the last 72 hours CBC:  Recent Labs Lab 02/05/13 0545 02/05/13 0548 02/05/13 1142 02/06/13 1445 02/07/13 0500 02/08/13 0630 02/09/13 0410 02/10/13 0448  WBC 17.1*  --  14.7* 20.5* 24.0* 19.3* 17.2* 15.6*  NEUTROABS 13.3*  --   --   --   --   --   --   --   HGB 15.4 16.0 14.3 14.4 12.6* 13.3 13.4 12.5*  HCT 45.0 47.0 43.5 41.6 38.4* 38.7* 39.0 37.3*  MCV 93.9  --  94.0 91.6 93.2 91.3 92.4 92.1  PLT 166  --  143* 181 176 207 201 189   PROTIME: No results found for this basename: LABPROT, INR,  in the last 72 hours Liver Function Tests: No results found for this basename: AST, ALT, ALKPHOS, BILITOT, PROT, ALBUMIN,  in the last 72 hours No results found for this basename: LIPASE, AMYLASE,  in  the last 72 hours BNP: BNP (last 3 results)  Recent Labs  02/05/13 0545 02/06/13 0200 02/08/13 0630  PROBNP 1337.0* 3722.0* 5645.0*   D-Dimer: No results found for this basename: DDIMER,  in the last 72 hours Hemoglobin A1C: No results found for this basename: HGBA1C,  in the last 72 hours Fasting Lipid Panel: No results found for this basename: CHOL, HDL, LDLCALC, TRIG, CHOLHDL, LDLDIRECT,  in the last 72 hours Thyroid Function Tests: No results found for this basename: TSH, T4TOTAL, FREET3, T3FREE, THYROIDAB,  in the last 72 hours Anemia Panel:  Recent Labs  02/08/13 0630  VITAMINB12 650         ASSESSMENT AND PLAN:  Principal Problem:   COPD exacerbation Active Problems:   Atrial fibrillation with RVR   Abdominal pain   Chronic steroid use   Diabetes mellitus   HTN (hypertension)   CAD (coronary artery disease)   Hypoxemia   Pulmonary edema   Acute bronchitis   Acute and chronic respiratory failure   Acute on chronic diastolic heart failure   Acute encephalopathy  Afib persistent  Can go home  Arrange f/u with [facility] PA with [facility] In about 3-4 weeks for considereation of cardiovesion if not reverted to sinus  Signed, Sherryl Manges MD  02/10/2013

## 2013-02-10 NOTE — Progress Notes (Signed)
Occupational Therapy Treatment Patient Details Name: [name] MRN: [id number] DOB: December 21, 1920 Today's Date: 02/10/2013 Time: 4098-1191 OT Time Calculation (min): 37 min  OT Assessment / Plan / Recommendation  History of present illness 79 year old male, with known history of Atrial fibrillation/Tachy-Brady syndrome, s/p PPM, Previously on Coumadin, but this was discontinued about 2 months ago. HTN, CAD, s/p PCI/DES to circumflex artery, previous DVT/PE, allergic rhinitis, bronchial asthma, COPD on chronic Prednisone therapy, dyslipidemia, DM-2. According to patient and his spouse, who were in the ED, patient has had a chronic productive cough, and over the last couple of days, this has become productive of yellow phlegm, without fever or chills, chest pain or increased SOB. He had left-sided abdominal pain through last night, and slept poorly, but had no vomiting or diarrhea. Patient is ambulant only with walker, and effort tolerance is poor. In the ED, patient was found to have temp 98.2, wcc 17.1, BP 140/78-137/92, and fast A.Fib with HR 108-149.   OT comments  Pt cont with confusion which limits pt's safety during adls.  Pt's HR up during ambulation.  Follow Up Recommendations  Home health OT;Supervision/Assistance - 24 hour;Other (comment)    Barriers to Discharge       Equipment Recommendations       Recommendations for Other Services    Frequency Min 2X/week   Progress towards OT Goals Progress towards OT goals: Progressing toward goals  Plan Discharge plan remains appropriate    Precautions / Restrictions Restrictions Weight Bearing Restrictions: No Other Position/Activity Restrictions: Pt's HR up to 157 when walking. Returned to room   Pertinent Vitals/Pain Pt with HR up to 157 when walking.  Pt returned to room.  O2 was 95% on 3L.    ADL  Eating/Feeding: Set up Where Assessed - Eating/Feeding: Chair Lower Body Dressing: Performed;Minimal assistance Where Assessed  - Lower Body Dressing: Supported sit to stand Toilet Transfer: Performed;Minimal assistance Toilet Transfer Method: Sit to stand Toilet Transfer Equipment: Raised toilet seat with arms (or 3-in-1 over toilet) Toileting - Clothing Manipulation and Hygiene: Performed;Minimal assistance Where Assessed - Glass blower/designer Manipulation and Hygiene: Standing Equipment Used: Gait belt;Rolling walker;Other (comment) Transfers/Ambulation Related to ADLs: Pt walked into hallway and back to room for adls at sink with min guard.  Pt ran into two items on the R when walking.  No LOB.  Pt noticed he hit something and corrected walker but did not lose balance. ADL Comments: Pt fixated this morning on the fact that he was in a cold barn last night. Pt either having hallucinations or dreaming but pt unable to be reasonable that he was in the hospital last night.  Pt HR limited how much we could do.    OT Diagnosis:    OT Problem List:   OT Treatment Interventions:     OT Goals(current goals can now be found in the care plan section) Acute Rehab OT Goals Patient Stated Goal: to go home OT Goal Formulation: With patient Time For Goal Achievement: 02/21/13 Potential to Achieve Goals: Good ADL Goals Pt Will Perform Grooming: with supervision;standing;sitting Pt Will Perform Upper Body Bathing: with supervision;sitting Pt Will Perform Lower Body Bathing: with supervision;sit to/from stand Pt Will Transfer to Toilet: with supervision;bedside commode  Visit Information  Last OT Received On: 02/10/13 Assistance Needed: +1 History of Present Illness: 79 year old male, with known history of Atrial fibrillation/Tachy-Brady syndrome, s/p PPM, Previously on Coumadin, but this was discontinued about 2 months ago. HTN, CAD, s/p  PCI/DES to circumflex artery, previous DVT/PE, allergic rhinitis, bronchial asthma, COPD on chronic Prednisone therapy, dyslipidemia, DM-2. According to patient and his spouse, who were in the  ED, patient has had a chronic productive cough, and over the last couple of days, this has become productive of yellow phlegm, without fever or chills, chest pain or increased SOB. He had left-sided abdominal pain through last night, and slept poorly, but had no vomiting or diarrhea. Patient is ambulant only with walker, and effort tolerance is poor. In the ED, patient was found to have temp 98.2, wcc 17.1, BP 140/78-137/92, and fast A.Fib with HR 108-149.    Subjective Data      Prior Functioning       Cognition  Cognition Arousal/Alertness: Awake/alert Behavior During Therapy: WFL for tasks assessed/performed Overall Cognitive Status: Impaired/Different from baseline Area of Impairment: Orientation;Memory;Safety/judgement;Awareness;Problem solving Orientation Level: Disoriented to;Time Memory: Decreased short-term memory Safety/Judgement: Decreased awareness of safety;Decreased awareness of deficits Awareness: Intellectual Problem Solving: Slow processing General Comments: Pt cont with some confusion today in regards to his whereabouts and why he is here.    Mobility  Bed Mobility Bed Mobility: Not assessed Transfers Transfers: Sit to Stand;Stand to Sit Sit to Stand: 4: Min guard;With armrests;From chair/3-in-1 Stand to Sit: 4: Min guard;With upper extremity assist;With armrests;To chair/3-in-1;To toilet Details for Transfer Assistance: cues for hand placement & technique.      Exercises      Balance Balance Balance Assessed: Yes Static Sitting Balance Static Sitting - Balance Support: Feet supported Static Sitting - Level of Assistance: 6: Modified independent (Device/Increase time) Static Standing Balance Static Standing - Balance Support: Left upper extremity supported;During functional activity Static Standing - Level of Assistance: 5: Stand by assistance   End of Session OT - End of Session Equipment Utilized During Treatment: Rolling walker;Oxygen Activity  Tolerance: Patient limited by fatigue Patient left: in chair;with call bell/phone within reach Nurse Communication: Mobility status;Precautions  GO     Hope Budds 02/10/2013, 11:38 AM [phone]

## 2013-02-10 NOTE — Progress Notes (Addendum)
Physical Therapy Treatment Patient Details Name: [name] MRN: [id number] DOB: December 21, 1920 Today's Date: 02/10/2013 Time: 4098-1191 PT Time Calculation (min): 16 min  PT Assessment / Plan / Recommendation  History of Present Illness 79 year old male, with known history of Atrial fibrillation/Tachy-Brady syndrome, s/p PPM, Previously on Coumadin, but this was discontinued about 2 months ago. HTN, CAD, s/p PCI/DES to circumflex artery, previous DVT/PE, allergic rhinitis, bronchial asthma, COPD on chronic Prednisone therapy, dyslipidemia, DM-2. According to patient and his spouse, who were in the ED, patient has had a chronic productive cough, and over the last couple of days, this has become productive of yellow phlegm, without fever or chills, chest pain or increased SOB. He had left-sided abdominal pain through last night, and slept poorly, but had no vomiting or diarrhea. Patient is ambulant only with walker, and effort tolerance is poor. In the ED, patient was found to have temp 98.2, wcc 17.1, BP 140/78-137/92, and fast A.Fib with HR 108-149.   PT Comments   Patient able to tolerate ambulation this afternoon, but did have increased HR 130s throughout, reached 160s as return to room. Patient did ask for some water prior to ambulation, upon drinking patient with wet cough, this occurred every time patient took a drink of water. When questioned, patient states that this happens all the time lately when he is drinking water. Nsg advised, may want to consider speech swallow assessment for possible aspiration risk.     Follow Up Recommendations  Home health PT;Supervision/Assistance - 24 hour;Other (comment) (depending on caregiver support may need SNF)           Equipment Recommendations  None recommended by PT       Frequency Min 3X/week   Progress towards PT Goals Progress towards PT goals: Progressing toward goals  Plan Current plan remains appropriate    Precautions /  Restrictions Restrictions Weight Bearing Restrictions: No Other Position/Activity Restrictions: Pt with elevated HR during ambulation   Pertinent Vitals/Pain SpO2 monitored during ambulation on 3 liters 95%, HR elevated to 130s with most of ambulation, reach 160s briefly as approaching return to room     Mobility  Bed Mobility Bed Mobility: Not assessed Transfers Transfers: Sit to Stand;Stand to Sit Sit to Stand: 4: Min guard;With armrests;From chair/3-in-1 Stand to Sit: 4: Min guard;With upper extremity assist;With armrests;To chair/3-in-1;To toilet Details for Transfer Assistance: cues for hand placement & technique.   Ambulation/Gait Ambulation/Gait Assistance: 4: Min guard Ambulation Distance (Feet): 160 Feet Assistive device: Rolling walker Ambulation/Gait Assistance Details: VCs for upright posture and positioning within RW, one standing rest break, HR elevated 130s reached 160s as approaching return to room Gait Pattern: Step-through pattern;Decreased stride length;Trunk flexed Gait velocity: decreased      PT Goals (current goals can now be found in the care plan section) Acute Rehab PT Goals Patient Stated Goal: to go home PT Goal Formulation: With patient Time For Goal Achievement: 02/13/13 Potential to Achieve Goals: Good  Visit Information  Last PT Received On: 02/10/13 Reason Eval/Treat Not Completed: Medical issues which prohibited therapy (HR during amb. with OT reached high 150s, will hold this am) History of Present Illness: 79 year old male, with known history of Atrial fibrillation/Tachy-Brady syndrome, s/p PPM, Previously on Coumadin, but this was discontinued about 2 months ago. HTN, CAD, s/p PCI/DES to circumflex artery, previous DVT/PE, allergic rhinitis, bronchial asthma, COPD on chronic Prednisone therapy, dyslipidemia, DM-2. According to patient and his spouse, who were in the ED, patient has  had a chronic productive cough, and over the last couple of  days, this has become productive of yellow phlegm, without fever or chills, chest pain or increased SOB. He had left-sided abdominal pain through last night, and slept poorly, but had no vomiting or diarrhea. Patient is ambulant only with walker, and effort tolerance is poor. In the ED, patient was found to have temp 98.2, wcc 17.1, BP 140/78-137/92, and fast A.Fib with HR 108-149.    Subjective Data  Subjective: i just want to get home to play with my new puppy Patient Stated Goal: to go home   Cognition  Cognition Arousal/Alertness: Awake/alert Behavior During Therapy: WFL for tasks assessed/performed Overall Cognitive Status: Impaired/Different from baseline Area of Impairment: Orientation;Memory;Safety/judgement;Awareness;Problem solving Orientation Level: Disoriented to;Time Memory: Decreased short-term memory Safety/Judgement: Decreased awareness of safety;Decreased awareness of deficits Awareness: Intellectual Problem Solving: Slow processing General Comments: Pt very pleasent but with some confusion today    Balance  Static Sitting Balance Static Sitting - Balance Support: Feet supported Static Sitting - Level of Assistance: 6: Modified independent (Device/Increase time)  End of Session PT - End of Session Equipment Utilized During Treatment: Gait belt;Oxygen (2L 02 via [place]) Activity Tolerance: Patient tolerated treatment well Patient left: in chair;with call bell/phone within reach Nurse Communication: Mobility status, HR, Wet cough with drinking water   GP     Fabio Asa 02/10/2013, 3:41 PM Charlotte Crumb, PT DPT  [phone]

## 2013-02-10 NOTE — Progress Notes (Signed)
PT Cancellation Note  Patient Details Name: SANFORD LINDBLAD MRN: [id number] DOB: 1920/06/16   Cancelled Treatment:    Reason Eval/Treat Not Completed: Medical issues which prohibited therapy (HR during amb. with OT reached high 150s, will hold this am).  Will attempt to work with patient this afternoon if appropriate.   Fabio Asa 02/10/2013, 12:06 PM

## 2013-02-10 NOTE — Progress Notes (Addendum)
[facility] Progress Note [place] TEAM 1 - Stepdown/ICU TEAM   [name] NFA:[id number] DOB: [birth year] DOA: 02/05/2013 PCP: [name], MD  Brief narrative: 79 y/o male with h/o A-fib, s/p pacemaker for tachy/brady, HTN, h/o DVT, DM 2, COPD on chronic prednisone admitted for cough with yellow sputum and left sided abdominal pain. Found to be having a COPD exacerbation complicated by  A-fib with RVR.   Assessment/Plan:  COPD exacerbation/ Acute bronchitis - taper steroids - resumed pulmicort today  - no appreciable wheeze today - watch w/ resumption of BB - Levaquin for acute bronchitis for total 5 days per Pulm   Atrial fibrillation with RVR - Paroxysmal - HR 150-160 w/ ambulation today - was initially only on Metoprolol - Cardizem up to 360 - Stopped Coumadin on his own per pt 2 mo ago - wife states he could not go for his blood draws - Cards recommends eliquis - co pay $70/ mo - Xarelto copay $30-40 - switched to Xarelto - discussed with Dr Tenny Craw - resume BB due to recurring RVR today - watch for induced wheeze   Pulmonary edema - management per Cards  Confusion/ Encephalopathy - started acutely week or two prior to admission per wife - was very sharp with a good memory prior to that - head CT negative - MRI cannot be done due to pacemaker - possible frontal infarct (was off of coumadin) - TSH normal - B12 and RPR - normal - wife comfortable with taking him home w her-  home health recommends HHPT - will add social worker and aid  Abdominal pain - CT scan did not reveal a pathology - resolved  Chronic steroid use - for COPD  Diabetes mellitus - uncontrolled - Hba1c is 8.9 - on Glipizide - will d/c due to propensity to lead to severe hypoglycemia in elderly pts - increased sliding scale - Increase lantus again - at age 76, will give pt a wide goal for "therapeutic"  Drainage from eyes - likely allergic - stopped Cipro drops and started  Patanol - follow  HTN (hypertension) - controlled  CAD (coronary artery disease) - stable  Code Status: DNR Family Communication: no family present at time of exam today  Disposition Plan: increase ambulation - watch for recurrence of RVR - f/u on ambulatory pulse ox - discussed with family that he may need home O2  Consultants: Cards Pulm   Procedures: none  Antibiotics: Levaquin - 9/24>>9/29  DVT prophylaxis: Xarelto  HPI/Subjective: Less confused today, but HR up to ~160 w/ ambulation.  Denies cp, f/c, n/v, or abdom pain.  Appears quite tired.  Objective: Blood pressure 148/79, pulse 156, temperature 97.8 F (36.6 C), temperature source Oral, resp. rate 20, height 5\' 8"  (1.727 m), weight 73.7 kg (162 lb 7.7 oz), SpO2 97.00%.  Intake/Output Summary (Last 24 hours) at 02/10/13 1429 Last data filed at 02/10/13 0900  Gross per 24 hour  Intake    360 ml  Output    500 ml  Net   -140 ml   Exam: General: No acute respiratory distress while sitting in chair  Lungs: no wheezes - good air movement th/o - no focal crackles  Cardiovascular: Irregular rate and rhythm without murmur  Abdomen: Nontender, distended and tympanic with high pitched bowel sounds, no rebound, no ascites, no appreciable mass Extremities: No significant cyanosis, clubbing, or edema bilateral lower extremities  Data Reviewed: Basic Metabolic Panel:  Recent Labs Lab 02/05/13 0545 02/05/13 0548 02/05/13 1142  02/06/13 0123 02/07/13 1017 02/08/13 0830 02/09/13 0410  NA 138 140  --  133* 135 133* 139  K 4.2 4.1  --  4.5 4.2 4.0 3.8  CL 98 100  --  95* 95* 95* 101  CO2 29  --   --  23 19 21 25   GLUCOSE 198* 198*  --  345* 247* 348* 276*  BUN 19 19  --  24* 49* 55* 60*  CREATININE 1.44* 1.60* 1.36* 1.66* 2.45* 2.19* 2.17*  CALCIUM 9.3  --   --  8.1* 8.8 8.9 8.2*   Liver Function Tests:  Recent Labs Lab 02/05/13 0545  AST 15  ALT 9  ALKPHOS 85  BILITOT 0.8  PROT 6.8  ALBUMIN 3.6    CBC:  Recent Labs Lab 02/05/13 0545  02/06/13 1445 02/07/13 0500 02/08/13 0630 02/09/13 0410 02/10/13 0448  WBC 17.1*  < > 20.5* 24.0* 19.3* 17.2* 15.6*  NEUTROABS 13.3*  --   --   --   --   --   --   HGB 15.4  < > 14.4 12.6* 13.3 13.4 12.5*  HCT 45.0  < > 41.6 38.4* 38.7* 39.0 37.3*  MCV 93.9  < > 91.6 93.2 91.3 92.4 92.1  PLT 166  < > 181 176 207 201 189  < > = values in this interval not displayed. Cardiac Enzymes:  Recent Labs Lab 02/05/13 0545 02/05/13 1130 02/05/13 1648  TROPONINI <0.30 <0.30 <0.30   BNP (last 3 results)  Recent Labs  02/05/13 0545 02/06/13 0200 02/08/13 0630  PROBNP 1337.0* 3722.0* 5645.0*   CBG:  Recent Labs Lab 02/09/13 1215 02/09/13 1609 02/09/13 2107 02/10/13 0618 02/10/13 1108  GLUCAP 211* 298* 283* 272* 312*    Recent Results (from the past 240 hour(s))  URINE CULTURE     Status: None   Collection Time    02/05/13  7:56 AM      Result Value Range Status   Specimen Description URINE, RANDOM   Final   Special Requests ADDED AT 0809A ON 119147   Final   Culture  Setup Time     Final   Value: 02/05/2013 14:46     Performed at [facility]   Colony Count     Final   Value: NO GROWTH     Performed at [facility]   Culture     Final   Value: NO GROWTH     Performed at [facility]   Report Status 02/06/2013 FINAL   Final  CULTURE, BLOOD (ROUTINE X 2)     Status: None   Collection Time    02/05/13 11:30 AM      Result Value Range Status   Specimen Description BLOOD RIGHT FOREARM   Final   Special Requests BOTTLES DRAWN AEROBIC AND ANAEROBIC 10CC   Final   Culture  Setup Time     Final   Value: 02/05/2013 15:55     Performed at [facility]   Culture     Final   Value:        BLOOD CULTURE RECEIVED NO GROWTH TO DATE CULTURE WILL BE HELD FOR 5 DAYS BEFORE ISSUING A FINAL NEGATIVE REPORT     Performed at [facility]   Report Status PENDING   Incomplete  CULTURE, BLOOD (ROUTINE  X 2)     Status: None   Collection Time    02/05/13 11:40 AM      Result Value Range  Status   Specimen Description BLOOD LEFT HAND   Final   Special Requests BOTTLES DRAWN AEROBIC ONLY 3CC   Final   Culture  Setup Time     Final   Value: 02/05/2013 15:55     Performed at [facility]   Culture     Final   Value:        BLOOD CULTURE RECEIVED NO GROWTH TO DATE CULTURE WILL BE HELD FOR 5 DAYS BEFORE ISSUING A FINAL NEGATIVE REPORT     Performed at [facility]   Report Status PENDING   Incomplete  MRSA PCR SCREENING     Status: None   Collection Time    02/05/13  6:58 PM      Result Value Range Status   MRSA by PCR NEGATIVE  NEGATIVE Final   Comment:            The GeneXpert MRSA Assay (FDA     approved for NASAL specimens     only), is one component of a     comprehensive MRSA colonization     surveillance program. It is not     intended to diagnose MRSA     infection nor to guide or     monitor treatment for     MRSA infections.     Studies:  Recent x-ray studies have been reviewed in detail by the Attending Physician  Scheduled Meds:  Scheduled Meds: . atorvastatin  10 mg Oral q1800  . benzonatate  200 mg Oral TID  . clonazePAM  0.5 mg Oral QHS  . diltiazem  360 mg Oral Daily  . furosemide  40 mg Oral Daily  . glipiZIDE  7.5 mg Oral QAC breakfast  . guaiFENesin  600 mg Oral BID  . insulin aspart  0-15 Units Subcutaneous TID WC  . insulin aspart  0-5 Units Subcutaneous QHS  . insulin glargine  20 Units Subcutaneous QHS  . ipratropium  0.5 mg Nebulization QID  . levalbuterol  0.63 mg Nebulization QID  . olopatadine  1 drop Both Eyes BID  . potassium chloride  20 mEq Oral Daily  . predniSONE  20 mg Oral Q breakfast  . rivaroxaban  15 mg Oral Q supper  . sodium chloride  3 mL Intravenous Q12H   Time spent on care of this patient: 35 min   MCCLUNG,JEFFREY T, MD  [facility] Office  [phone] Pager - Text Page per Loretha Stapler as per  below:  On-Call/Text Page:      [email or web address]      password [email or web address]  If 7PM-7AM, please contact night-coverage [email or web address] Password [email or web address] 02/10/2013, 2:29 PM   LOS: 5 days

## 2013-02-10 NOTE — Progress Notes (Signed)
Patient evaluated for community based chronic disease management services with [facility] Care Management Program as a benefit of patient's [employer]. Spoke with patient at bedside to explain [facility] Care Management services.  Services have been accepted for COPD/CHF management.  Patient is followed by [facility] cards and pulmonology.  Patient lives with his wife who assist with his care.  Patient will receive a post discharge transition of care call and will be evaluated for monthly home visits for assessments and disease process education.  Left contact information and [facility] literature at bedside. Made inpatient Case Manager aware that [facility] Care Management following. Of note, [facility] Care Management services does not replace or interfere with any services that are arranged by inpatient case management or social work.  For additional questions or referrals please contact Anibal Henderson BSN RN [facility] Liaison at [phone].

## 2013-02-11 ENCOUNTER — Encounter: Payer: Medicare Other | Admitting: Internal Medicine

## 2013-02-11 LAB — CBC
HCT: 41.2 % (ref 39.0–52.0)
Hemoglobin: 13.9 g/dL (ref 13.0–17.0)
MCH: 31.3 pg (ref 26.0–34.0)
MCHC: 33.7 g/dL (ref 30.0–36.0)
MCV: 92.8 fL (ref 78.0–100.0)
Platelets: 185 10*3/uL (ref 150–400)
RBC: 4.44 MIL/uL (ref 4.22–5.81)
RDW: 13.6 % (ref 11.5–15.5)
WBC: 18.6 10*3/uL — ABNORMAL HIGH (ref 4.0–10.5)

## 2013-02-11 LAB — CULTURE, BLOOD (ROUTINE X 2): Culture: NO GROWTH

## 2013-02-11 LAB — GLUCOSE, CAPILLARY: Glucose-Capillary: 213 mg/dL — ABNORMAL HIGH (ref 70–99)

## 2013-02-11 LAB — BASIC METABOLIC PANEL
BUN: 49 mg/dL — ABNORMAL HIGH (ref 6–23)
CO2: 26 mEq/L (ref 19–32)
Calcium: 8.4 mg/dL (ref 8.4–10.5)
Chloride: 103 mEq/L (ref 96–112)
Creatinine, Ser: 1.89 mg/dL — ABNORMAL HIGH (ref 0.50–1.35)
GFR calc Af Amer: 34 mL/min — ABNORMAL LOW (ref >90)
GFR calc non Af Amer: 30 mL/min — ABNORMAL LOW (ref >90)
Glucose, Bld: 183 mg/dL — ABNORMAL HIGH (ref 70–99)
Potassium: 3.7 mEq/L (ref 3.5–5.1)
Sodium: 141 mEq/L (ref 135–145)

## 2013-02-11 MED ORDER — DILTIAZEM HCL ER COATED BEADS 360 MG PO CP24: 360.0000 mg | ORAL_CAPSULE | Freq: Every day | ORAL | Status: DC

## 2013-02-11 MED ORDER — RIVAROXABAN 15 MG PO TABS: 15.0000 mg | ORAL_TABLET | Freq: Every day | ORAL | Status: DC

## 2013-02-11 MED ORDER — PREDNISONE 10 MG PO TABS: 20.0000 mg | ORAL_TABLET | Freq: Every day | ORAL | Status: DC

## 2013-02-11 MED ORDER — PREDNISONE 5 MG PO TBEC: 5.0000 mg | DELAYED_RELEASE_TABLET | Freq: Every day | ORAL | Status: DC

## 2013-02-11 MED ORDER — OLOPATADINE HCL 0.1 % OP SOLN: 1.0000 [drp] | Freq: Two times a day (BID) | OPHTHALMIC | Status: DC

## 2013-02-11 MED ORDER — LEVALBUTEROL HCL 0.63 MG/3ML IN NEBU: 0.6300 mg | INHALATION_SOLUTION | Freq: Four times a day (QID) | RESPIRATORY_TRACT | Status: DC

## 2013-02-11 MED ORDER — GLIPIZIDE 5 MG PO TABS: 7.5000 mg | ORAL_TABLET | Freq: Two times a day (BID) | ORAL | Status: DC

## 2013-02-11 MED ORDER — IPRATROPIUM BROMIDE 0.02 % IN SOLN: 0.5000 mg | Freq: Four times a day (QID) | RESPIRATORY_TRACT | Status: DC

## 2013-02-11 NOTE — Progress Notes (Signed)
Pt ambulated in hallway with Physical Therapy 300 ft, HR stable. Pt ambulated on RA and O2 sats remained > 96 % while ambulating per Physical Therapy. Pt back in chair, call bell within reach. Will continue to monitor.

## 2013-02-11 NOTE — Progress Notes (Signed)
Physical Therapy Treatment Patient Details Name: [name] MRN: [id number] DOB: 1920/08/06 Today's Date: 02/11/2013 Time: 0801-0825 PT Time Calculation (min): 24 min  PT Assessment / Plan / Recommendation  History of Present Illness 79 year old male, with known history of Atrial fibrillation/Tachy-Brady syndrome, s/p PPM, Previously on Coumadin, but this was discontinued about 2 months ago. HTN, CAD, s/p PCI/DES to circumflex artery, previous DVT/PE, allergic rhinitis, bronchial asthma, COPD on chronic Prednisone therapy, dyslipidemia, DM-2. According to patient and his spouse, who were in the ED, patient has had a chronic productive cough, and over the last couple of days, this has become productive of yellow phlegm, without fever or chills, chest pain or increased SOB. He had left-sided abdominal pain through last night, and slept poorly, but had no vomiting or diarrhea. Patient is ambulant only with walker, and effort tolerance is poor. In the ED, patient was found to have temp 98.2, wcc 17.1, BP 140/78-137/92, and fast A.Fib with HR 108-149.   PT Comments   Patient demonstrates improvements in activity tolerance with increased ambulation today. Patient was able to ambulate double the distance today with no adverse elevation in HR (HR stable below 110s) and was able to ambulate on room air with SpO2 remaining >96% at all times despite audible rails. Spoke with patient regarding expectations for mobility upon discharge. Patient does continue to present with wet cough follow swallows of water, nsg aware.  Will continue to see as indicated.   Follow Up Recommendations  Home health PT;Supervision/Assistance - 24 hour;Other (comment) (depending on caregiver support may need SNF)           Equipment Recommendations  None recommended by PT       Frequency Min 3X/week   Progress towards PT Goals Progress towards PT goals: Progressing toward goals  Plan Current plan remains appropriate     Precautions / Restrictions Precautions Precautions: Fall Restrictions Weight Bearing Restrictions: No Other Position/Activity Restrictions: Pt with elevated HR during ambulation   Pertinent Vitals/Pain HR 70s at rest with spo2 99% on 2 L at rest; during ambulation activity HR remained below 110s with SpO2 on room air staying >96% at all times.     Mobility  Bed Mobility Bed Mobility: Not assessed Transfers Transfers: Sit to Stand;Stand to Sit Sit to Stand: 5: Supervision Stand to Sit: 5: Supervision Details for Transfer Assistance: Good hand placement with transfers Ambulation/Gait Ambulation/Gait Assistance: 5: Supervision;4: Min guard Ambulation Distance (Feet): 380 Feet Assistive device: Rolling walker Ambulation/Gait Assistance Details: VCs for upright posture, ambulated on room air today, multiple breaks to spot assess vitals Gait Pattern: Step-through pattern;Decreased stride length;Trunk flexed Gait velocity: decreased General Gait Details: improved stability and endurance today, patient able to maintain HR below 110s with activity and SpO2 on rm air >96% at all times with activity despite audible rails     PT Goals (current goals can now be found in the care plan section) Acute Rehab PT Goals Patient Stated Goal: to go home PT Goal Formulation: With patient Time For Goal Achievement: 02/13/13 Potential to Achieve Goals: Good  Visit Information  Last PT Received On: 02/11/13 Assistance Needed: +1 History of Present Illness: 79 year old male, with known history of Atrial fibrillation/Tachy-Brady syndrome, s/p PPM, Previously on Coumadin, but this was discontinued about 2 months ago. HTN, CAD, s/p PCI/DES to circumflex artery, previous DVT/PE, allergic rhinitis, bronchial asthma, COPD on chronic Prednisone therapy, dyslipidemia, DM-2. According to patient and his spouse, who were in the ED,  patient has had a chronic productive cough, and over the last couple of days, this  has become productive of yellow phlegm, without fever or chills, chest pain or increased SOB. He had left-sided abdominal pain through last night, and slept poorly, but had no vomiting or diarrhea. Patient is ambulant only with walker, and effort tolerance is poor. In the ED, patient was found to have temp 98.2, wcc 17.1, BP 140/78-137/92, and fast A.Fib with HR 108-149.    Subjective Data  Subjective: Pt very excited about seeing his puppy when he gets home Patient Stated Goal: to go home   Cognition  Cognition Arousal/Alertness: Awake/alert Behavior During Therapy: WFL for tasks assessed/performed Overall Cognitive Status: Impaired/Different from baseline Area of Impairment: Memory Memory: Decreased short-term memory Awareness: Intellectual Problem Solving: Slow processing General Comments: Pt with decreased memory, but improved problem solving today    Balance  Static Sitting Balance Static Sitting - Balance Support: Feet supported Static Sitting - Level of Assistance: 6: Modified independent (Device/Increase time) Static Standing Balance Static Standing - Balance Support: Bilateral upper extremity supported;During functional activity Static Standing - Level of Assistance: 6: Modified independent (Device/Increase time);5: Stand by assistance Static Standing - Comment/# of Minutes: multiple standing rest breaks for citals assessment   End of Session PT - End of Session Equipment Utilized During Treatment: Gait belt;Oxygen (on room air) Activity Tolerance: Patient tolerated treatment well Patient left: in chair;with call bell/phone within reach Nurse Communication: Mobility status   GP     Fabio Asa 02/11/2013, 8:30 AM Charlotte Crumb, PT DPT  [phone]

## 2013-02-11 NOTE — Progress Notes (Signed)
Discharge paperwork given to patient and discussed. All questions answered. Patient aware of all follow-up appointments, and all prescriptions given.

## 2013-02-11 NOTE — Progress Notes (Signed)
MD paged and aware, Nursing Requesting swallow evaluation due to Pt getting choked on water when drinking. MD at bedside. Will continue to monitor.

## 2013-02-11 NOTE — Discharge Summary (Signed)
Physician Discharge Summary  [name] MWU:[id number] DOB: 1920/10/08 DOA: 02/05/2013  PCP: [name], MD  Admit date: 02/05/2013 Discharge date: 02/11/2013  Time spent: >45  minutes  Recommendations for Outpatient Follow-up:  1. HH PT/ RN/ Aid/ Child psychotherapist  Discharge Diagnoses:  Principal Problem:   COPD exacerbation Active Problems:   Atrial fibrillation   Abdominal pain   Chronic steroid use   Diabetes mellitus   HTN (hypertension)   Hypoxemia   Acute bronchitis   Acute and chronic respiratory failure   Acute on chronic diastolic heart failure   Acute encephalopathy   Chronic renal disease, stage 3, moderately decreased glomerular filtration rate between 30-59 mL/min/1.73 square meter   Discharge Condition: stable- home with wife  Diet recommendation: heart healthy, diabetic  Filed Weights   02/08/13 1600 02/10/13 0428  Weight: 74.6 kg (164 lb 7.4 oz) 73.7 kg (162 lb 7.7 oz)    History of present illness:  79 y/o male with h/o A-fib, s/p pacemaker for tachy/brady, HTN, h/o DVT, DM 2, COPD on chronic prednisone admitted for cough with yellow sputum and left sided abdominal pain. Found to be having a COPD exacerbation complicated by A-fib with RVR.    Hospital Course:  COPD exacerbation/ Acute bronchitis  - tapering steroids - resumed pulmicort  - did not have albuterol of Atrovent nebs at home- prescribed Xopenex and atrovent.   - Levaquin given for acute bronchitis for total 5 days per Pulm  - he has ambulated and does not need O2 at rest or with exertion  Atrial fibrillation with RVR  - Paroxysmal   - was initially only on Metoprolol  - added Cardizem up to 360  - Stopped Coumadin on his own per pt 2 mo ago - wife states he could not go for his blood draws  - Cards recommends eliquis - co pay $70/ mo  - Xarelto copay $30-40 - switched to Xarelto - discussed with Dr Tenny Craw   Pulmonary edema  - cont lasix at 40 mg daily  Confusion/  Encephalopathy  - started acutely week or two prior to admission per wife - was very sharp with a good memory prior to that  - head CT negative  - MRI cannot be done due to pacemaker  - possible frontal infarct (was off of coumadin)  - TSH normal  - B12 and RPR - normal  -  has improved significantly after transferring out of SDU/ICU  Abdominal pain  - CT scan did not reveal a pathology - resolved   Chronic steroid use  - for COPD   Diabetes mellitus  - uncontrolled - Hba1c is 8.9  - on Glipizide -  - at age 51, will give pt a wide goal for "therapeutic"   Drainage from eyes  - likely allergic - stopped Cipro drops and started Patanol - his wife states his drainage has improved with this   HTN (hypertension)  - controlled   CAD (coronary artery disease)  - stable   Procedures:  none  Consultations:  Cardio  Discharge Exam: Filed Vitals:   02/11/13 1335  BP: 177/63  Pulse: 85  Temp: 98 F (36.7 C)  Resp: 18    General: AAO x 3, no distress Cardiovascular: RRR no murmurs Respiratory:rhonchi b/l   Discharge Instructions   Future Appointments Provider Department Dept Phone   03/12/2013 11:30 AM Duke Salvia, MD [facility] [place] Office [phone]   07/21/2013 10:45 AM [name], MD [place]  [facility] [phone]       Medication List    STOP taking these medications       LORazepam 0.5 MG tablet  Commonly known as:  ATIVAN      TAKE these medications       budesonide 0.25 MG/2ML nebulizer solution  Commonly known as:  PULMICORT  Use two times a day     clonazePAM 0.5 MG tablet  Commonly known as:  KLONOPIN  Take 0.5 mg by mouth at bedtime.     diltiazem 360 MG 24 hr capsule  Commonly known as:  CARDIZEM CD  Take 1 capsule (360 mg total) by mouth daily.     furosemide 40 MG tablet  Commonly known as:  LASIX  Take 1 tablet (40 mg total) by mouth daily.     glipiZIDE 5 MG tablet  Commonly known as:  GLUCOTROL  Take  1.5 tablets (7.5 mg total) by mouth 2 (two) times daily before a meal.     HYDROcodone-acetaminophen 5-325 MG per tablet  Commonly known as:  NORCO/VICODIN  Take 0.5-1 tablets by mouth every 8 (eight) hours as needed for pain. prn     ipratropium 0.02 % nebulizer solution  Commonly known as:  ATROVENT  Take 500 mcg by nebulization 4 (four) times daily.     ipratropium 0.02 % nebulizer solution  Commonly known as:  ATROVENT  Take 2.5 mLs (0.5 mg total) by nebulization 4 (four) times daily.     levalbuterol 0.63 MG/3ML nebulizer solution  Commonly known as:  XOPENEX  Take 3 mLs (0.63 mg total) by nebulization 4 (four) times daily.     lovastatin 40 MG tablet  Commonly known as:  MEVACOR  Take 40 mg by mouth at bedtime.     metoprolol succinate 25 MG 24 hr tablet  Commonly known as:  TOPROL-XL  Take 1 tablet (25 mg total) by mouth daily.     nitroGLYCERIN 0.3 MG SL tablet  Commonly known as:  NITROSTAT  Place 1 tablet (0.3 mg total) under the tongue every 5 (five) minutes as needed.     olopatadine 0.1 % ophthalmic solution  Commonly known as:  PATANOL  Place 1 drop into both eyes 2 (two) times daily.     predniSONE 10 MG tablet  Commonly known as:  DELTASONE  Take 2 tablets (20 mg total) by mouth daily with breakfast.     PredniSONE 5 MG Tbec  Take 5 mg by mouth daily.     promethazine-codeine 6.25-10 MG/5ML syrup  Commonly known as:  PHENERGAN with CODEINE  Take 5 mLs by mouth every 4 (four) hours as needed for cough.     QC DAILY MULTIVITAMINS/IRON Tabs  Take 1 tablet by mouth daily.     Rivaroxaban 15 MG Tabs tablet  Commonly known as:  XARELTO  Take 1 tablet (15 mg total) by mouth daily with supper.       Allergies  Allergen Reactions  . Asa Buff (Mag [Buffered Aspirin] Nausea Only  . Ibuprofen     REACTION: causes nausea and vomitting       Follow-up Information   Follow up with [name], MD. Schedule an appointment as soon as possible for a  visit in 1 week.   Specialty:  Internal Medicine   Contact information:   [address] [place] [address] [phone]        The results of significant diagnostics from this  hospitalization (including imaging, microbiology, ancillary and laboratory) are listed below for reference.    Significant Diagnostic Studies: Dg Chest 2 View  02/05/2013   CLINICAL DATA:  Shortness of breath. Left side abdominal pain.  EXAM: CHEST  2 VIEW  COMPARISON:  PA and lateral chest 09/05/2012.  FINDINGS: Mild streaky opacity in the left lung base is most consistent with atelectasis. The right lung is clear. Heart size is normal. No pneumothorax or pleural fluid. Pacing device noted.  IMPRESSION: Mild opacity left lung base most compatible with atelectasis.   Electronically Signed   By: Drusilla Kanner M.D.   On: 02/05/2013 06:30   Ct Head Wo Contrast  02/08/2013   CLINICAL DATA:  79 year old male with confusion.  EXAM: CT HEAD WITHOUT CONTRAST  TECHNIQUE: Contiguous axial images were obtained from the base of the skull through the vertex without intravenous contrast.  COMPARISON:  07/24/2008.  FINDINGS: Chronic paranasal sinusitis. Previous paranasal sinus surgery. Polypoid opacity in the posterior right nasal cavity is new or increased (series 3, image 4). Elsewhere paranasal sinus pneumatization has improved, although now the right frontal sinus is opacified.  Mastoids remain clear. No acute osseous abnormality identified. No acute orbit or scalp soft tissue findings.  Insert calyx cerebral volume not significantly changed. No ventriculomegaly. No midline shift, mass effect, or evidence of intracranial mass lesion. Periventricular white matter hypodensity stable to mildly increased. No evidence of cortically based acute infarction identified. No suspicious intracranial vascular hyperdensity. No acute intracranial hemorrhage identified.  IMPRESSION: 1. No acute intracranial  abnormality. Mild for age nonspecific white matter changes.  2. Chronic paranasal sinusitis status post sinus surgeries. Increased posterior right nasal cavity polyposis since 2010.   Electronically Signed   By: Augusto Gamble M.D.   On: 02/08/2013 12:15   Ct Abdomen Pelvis W Contrast  02/05/2013   CLINICAL DATA:  Mid to left abdomen pain and left lower quadrant pain for 2 days  EXAM: CT ABDOMEN AND PELVIS WITH CONTRAST  TECHNIQUE: Multidetector CT imaging of the abdomen and pelvis was performed using the standard protocol following bolus administration of intravenous contrast.  CONTRAST:  70mL OMNIPAQUE IOHEXOL 300 MG/ML  SOLN  COMPARISON:  CT abdomen pelvis of 09/05/2008  FINDINGS: On lung window images there is minimal linear atelectasis at the left lung base. Cardiomegaly is stable. Pacer wire is noted. The liver enhances and is unchanged in configuration with no focal abnormality noted. At least 1 gallstone layers in the neck of the gallbladder and the gallbladder wall is not thickened. The pancreas is somewhat atrophic and the pancreatic duct is not dilated. The adrenal glands and spleen are unremarkable. The stomach is decompressed and difficult to evaluate. The kidneys enhance with no calculus or mass and on delayed images, the pelvocaliceal systems are unremarkable. The abdominal aorta is normal in caliber with moderate atheromatous change diffusely. Calcifications are present at the origins of the celiac axis and SMA as well as the renal arteries. No adenopathy is seen.  No abdominal wall hernia is seen. The urinary bladder is not well distended but no abnormality is seen. The prostate is within normal limits in size. No fluid is seen within the pelvis. Fat does into the inguinal canals. Multiple rectosigmoid colonic diverticular are present with diverticula also within the descending colon. However no mucosal edema of the colon is seen. The terminal ileum and the appendix are unremarkable. There are  moderate degenerative changes in both hips. The SI joints appear somewhat sclerotic and sacroiliitis  cannot be excluded. There are diffuse degenerative changes throughout the lumbar spine and facet joints with normal alignment maintained.  IMPRESSION: 1. No explanation for the patient's left abdominal pain is seen. No renal or ureteral calculi are noted. 2. Single gallstone within the gallbladder. No gallbladder wall thickening. 3. Multiple rectosigmoid and distal descending colon diverticula. No diverticulitis. 4. The appendix and terminal ileum are unremarkable. 5. Cannot exclude sacroiliitis versus degenerative change of the SI joints.   Electronically Signed   By: Dwyane Dee M.D.   On: 02/05/2013 07:51   Dg Abd Portable 2v  02/06/2013   CLINICAL DATA:  79 year old male with distended abdomen and abnormal bowel sounds.  EXAM: PORTABLE ABDOMEN - 2 VIEW  COMPARISON:  CT Abdomen and Pelvis 02/05/2013.  FINDINGS: Portable supine and left-side-down lateral decubitus views of the abdomen at 1304 hrs. Excreted IV contrast in the bladder which is moderately distended. Oral contrast now in the right colon. Mild colonic distention with gas, but overall non obstructed bowel gas pattern. No pneumoperitoneum. Stable visualized osseous structures.  IMPRESSION: No free air. Mildly gas distended colon but overall non obstructed bowel gas pattern. Oral contrast from recent CT now in the right colon.   Electronically Signed   By: Augusto Gamble M.D.   On: 02/06/2013 13:30    Microbiology: Recent Results (from the past 240 hour(s))  URINE CULTURE     Status: None   Collection Time    02/05/13  7:56 AM      Result Value Range Status   Specimen Description URINE, RANDOM   Final   Special Requests ADDED AT 0809A ON [id number]   Final   Culture  Setup Time     Final   Value: 02/05/2013 14:46     Performed at [facility]   Colony Count     Final   Value: NO GROWTH     Performed at [facility]   Culture      Final   Value: NO GROWTH     Performed at [facility]   Report Status 02/06/2013 FINAL   Final  CULTURE, BLOOD (ROUTINE X 2)     Status: None   Collection Time    02/05/13 11:30 AM      Result Value Range Status   Specimen Description BLOOD RIGHT FOREARM   Final   Special Requests BOTTLES DRAWN AEROBIC AND ANAEROBIC 10CC   Final   Culture  Setup Time     Final   Value: 02/05/2013 15:55     Performed at [facility]   Culture     Final   Value: NO GROWTH 5 DAYS     Performed at [facility]   Report Status 02/11/2013 FINAL   Final  CULTURE, BLOOD (ROUTINE X 2)     Status: None   Collection Time    02/05/13 11:40 AM      Result Value Range Status   Specimen Description BLOOD LEFT HAND   Final   Special Requests BOTTLES DRAWN AEROBIC ONLY 3CC   Final   Culture  Setup Time     Final   Value: 02/05/2013 15:55     Performed at [facility]   Culture     Final   Value: NO GROWTH 5 DAYS     Performed at [facility]   Report Status 02/11/2013 FINAL   Final  MRSA PCR SCREENING     Status: None   Collection  Time    02/05/13  6:58 PM      Result Value Range Status   MRSA by PCR NEGATIVE  NEGATIVE Final   Comment:            The GeneXpert MRSA Assay (FDA     approved for NASAL specimens     only), is one component of a     comprehensive MRSA colonization     surveillance program. It is not     intended to diagnose MRSA     infection nor to guide or     monitor treatment for     MRSA infections.     Labs: Basic Metabolic Panel:  Recent Labs Lab 02/06/13 0123 02/07/13 1017 02/08/13 0830 02/09/13 0410 02/11/13 0540  NA 133* 135 133* 139 141  K 4.5 4.2 4.0 3.8 3.7  CL 95* 95* 95* 101 103  CO2 23 19 21 25 26   GLUCOSE 345* 247* 348* 276* 183*  BUN 24* 49* 55* 60* 49*  CREATININE 1.66* 2.45* 2.19* 2.17* 1.89*  CALCIUM 8.1* 8.8 8.9 8.2* 8.4   Liver Function Tests:  Recent Labs Lab 02/05/13 0545  AST 15  ALT 9  ALKPHOS 85   BILITOT 0.8  PROT 6.8  ALBUMIN 3.6   No results found for this basename: LIPASE, AMYLASE,  in the last 168 hours No results found for this basename: AMMONIA,  in the last 168 hours CBC:  Recent Labs Lab 02/05/13 0545  02/07/13 0500 02/08/13 0630 02/09/13 0410 02/10/13 0448 02/11/13 0540  WBC 17.1*  < > 24.0* 19.3* 17.2* 15.6* 18.6*  NEUTROABS 13.3*  --   --   --   --   --   --   HGB 15.4  < > 12.6* 13.3 13.4 12.5* 13.9  HCT 45.0  < > 38.4* 38.7* 39.0 37.3* 41.2  MCV 93.9  < > 93.2 91.3 92.4 92.1 92.8  PLT 166  < > 176 207 201 189 185  < > = values in this interval not displayed. Cardiac Enzymes:  Recent Labs Lab 02/05/13 0545 02/05/13 1130 02/05/13 1648  TROPONINI <0.30 <0.30 <0.30   BNP: BNP (last 3 results)  Recent Labs  02/05/13 0545 02/06/13 0200 02/08/13 0630  PROBNP 1337.0* 3722.0* 5645.0*   CBG:  Recent Labs Lab 02/10/13 1108 02/10/13 1609 02/10/13 2122 02/11/13 0640 02/11/13 1114  GLUCAP 312* 326* 284* 185* 213*       Signed:  Sharea Guinther  [facility] 02/11/2013, 4:06 PM

## 2013-02-11 NOTE — Progress Notes (Signed)
Patient Name: [name]      SUBJECTIVE: admitted with COPD flare aggravated by PAF and RVR treated with bisoprolol and uptitration of his CCB Apart from hosp device interrogation demonstrated relatively infrequent Afib  Also hx of PE and DVT; had stopped coumadin but is now   on Rivaroxaban   ddenies sob  waliking in the halls  Still wheezing  Past Medical History  Diagnosis Date  . Presence of permanent cardiac pacemaker 04/2006 upgrade    a. s/p MDT Adapta ADDR01 dual chamber PPM, ser #: [id number] H.  [name] PAF (paroxysmal atrial fibrillation)     a. previously on coumadin-->patient self-d/c'd 11/2012 b/c he was tired of having INR's checked.  . Tachy-brady syndrome   . DM (diabetes mellitus)   . CAD (coronary artery disease)     DES to cx  . Pulmonary embolism 12/2003  . Deep vein thrombophlebitis of leg 2005, 2009    recurrent when off anticoag  . HTN (hypertension)   . Allergic rhinitis   . Asthma   . CKD (chronic kidney disease), stage III   . Nasal polyp     Scheduled Meds:  Scheduled Meds: . atorvastatin  10 mg Oral q1800  . benzonatate  200 mg Oral TID  . budesonide (PULMICORT) nebulizer solution  0.25 mg Nebulization BID  . clonazePAM  0.5 mg Oral QHS  . diltiazem  360 mg Oral Daily  . furosemide  40 mg Oral Daily  . guaiFENesin  600 mg Oral BID  . insulin aspart  0-15 Units Subcutaneous TID WC  . insulin aspart  0-5 Units Subcutaneous QHS  . insulin glargine  26 Units Subcutaneous QHS  . ipratropium  0.5 mg Nebulization QID  . levalbuterol  0.63 mg Nebulization QID  . metoprolol tartrate  12.5 mg Oral BID  . olopatadine  1 drop Both Eyes BID  . potassium chloride  20 mEq Oral Daily  . predniSONE  20 mg Oral Q breakfast  . rivaroxaban  15 mg Oral Q supper  . sodium chloride  3 mL Intravenous Q12H   Continuous Infusions:   PHYSICAL EXAM Filed Vitals:   02/10/13 1100 02/10/13 1511 02/10/13 2125 02/11/13 0404  BP:  135/75 156/75 122/77  Pulse:  156 92 114 79  Temp:  98 F (36.7 C) 97.9 F (36.6 C) 97.6 F (36.4 C)  TempSrc:  Oral Oral Oral  Resp:  18 20 20   Height:      Weight:      SpO2: 97% 97% 99% 97%  Well developed and nourished in mod resp distress HENT normal Neck supple   Wheezes but less than yesterday Irregularly irregular rate and rhythm with controlled ventricular response, no murmurs or gallops Abd-soft with active BS without hepatomegaly No Clubbing cyanosis tr edema Skin-warm and dry A & Oriented  Grossly normal sensory and motor function   TELEMETRY: Reviewed telemetry pt in *afib at HR 90    Intake/Output Summary (Last 24 hours) at 02/11/13 0731 Last data filed at 02/10/13 1451  Gross per 24 hour  Intake    480 ml  Output    450 ml  Net     30 ml    LABS: Basic Metabolic Panel:  Recent Labs Lab 02/05/13 0545 02/05/13 0548 02/05/13 1142 02/06/13 0123 02/07/13 1017 02/08/13 0830 02/09/13 0410 02/11/13 0540  NA 138 140  --  133* 135 133* 139 141  K 4.2 4.1  --  4.5 4.2 4.0 3.8 3.7  CL  98 100  --  95* 95* 95* 101 103  CO2 29  --   --  23 19 21 25 26   GLUCOSE 198* 198*  --  345* 247* 348* 276* 183*  BUN 19 19  --  24* 49* 55* 60* 49*  CREATININE 1.44* 1.60* 1.36* 1.66* 2.45* 2.19* 2.17* 1.89*  CALCIUM 9.3  --   --  8.1* 8.8 8.9 8.2* 8.4   Cardiac Enzymes: No results found for this basename: CKTOTAL, CKMB, CKMBINDEX, TROPONINI,  in the last 72 hours CBC:  Recent Labs Lab 02/05/13 0545  02/05/13 1142 02/06/13 1445 02/07/13 0500 02/08/13 0630 02/09/13 0410 02/10/13 0448 02/11/13 0540  WBC 17.1*  --  14.7* 20.5* 24.0* 19.3* 17.2* 15.6* 18.6*  NEUTROABS 13.3*  --   --   --   --   --   --   --   --   HGB 15.4  < > 14.3 14.4 12.6* 13.3 13.4 12.5* 13.9  HCT 45.0  < > 43.5 41.6 38.4* 38.7* 39.0 37.3* 41.2  MCV 93.9  --  94.0 91.6 93.2 91.3 92.4 92.1 92.8  PLT 166  --  143* 181 176 207 201 189 185  < > = values in this interval not displayed. PROTIME: No results found for this  basename: LABPROT, INR,  in the last 72 hours Liver Function Tests: No results found for this basename: AST, ALT, ALKPHOS, BILITOT, PROT, ALBUMIN,  in the last 72 hours No results found for this basename: LIPASE, AMYLASE,  in the last 72 hours BNP: BNP (last 3 results)  Recent Labs  02/05/13 0545 02/06/13 0200 02/08/13 0630  PROBNP 1337.0* 3722.0* 5645.0*   D-Dimer: No results found for this basename: DDIMER,  in the last 72 hours Hemoglobin A1C: No results found for this basename: HGBA1C,  in the last 72 hours Fasting Lipid Panel: No results found for this basename: CHOL, HDL, LDLCALC, TRIG, CHOLHDL, LDLDIRECT,  in the last 72 hours Thyroid Function Tests: No results found for this basename: TSH, T4TOTAL, FREET3, T3FREE, THYROIDAB,  in the last 72 hours Anemia Panel: No results found for this basename: VITAMINB12, FOLATE, FERRITIN, TIBC, IRON, RETICCTPCT,  in the last 72 hours       ASSESSMENT AND PLAN:  Principal Problem:   COPD exacerbation Active Problems:   Atrial fibrillation   Abdominal pain   Chronic steroid use   Diabetes mellitus   HTN (hypertension)   Hypoxemia   Acute bronchitis   Acute and chronic respiratory failure   Acute on chronic diastolic heart failure   Acute encephalopathy   Chronic renal disease, stage 3, moderately decreased glomerular filtration rate between 30-59 mL/min/1.73 square meter  Afib persistent  Can go home still  Arrange f/u with [facility] PA with [facility] In about 3-4 weeks for consideration of cardiovesion if not reverted to sinus He seems to be tolerating the low dose  BB Renal function stable Will sign off   Signed, Sherryl Manges MD  02/11/2013

## 2013-02-11 NOTE — Progress Notes (Signed)
Inpatient Diabetes Program Recommendations  AACE/ADA: New Consensus Statement on Inpatient Glycemic Control (2013)  Target Ranges:  Prepandial:   less than 140 mg/dL      Peak postprandial:   less than 180 mg/dL (1-2 hours)      Critically ill patients:  140 - 180 mg/dL   Results for [name], [name] (MRN [id number]) as of 02/11/2013 13:28  Ref. Range 02/10/2013 06:18 02/10/2013 11:08 02/10/2013 16:09 02/10/2013 21:22 02/11/2013 06:40 02/11/2013 11:14  Glucose-Capillary Latest Range: 70-99 mg/dL 409 (H) 811 (H) 914 (H) 284 (H) 185 (H) 213 (H)    Inpatient Diabetes Program Recommendations Correction (SSI): Please consider increasing Novolog correction to Resistant scale. Insulin - Meal Coverage: Please consider ordering Novolog 3 units TID with meals for meal coverage while on steroids.  Note: Blood glucose over the past 30 hours has ranged from 185-326 mg/dl and postprandial glucose is consistently elevated.  Please consider increasing Novolog correction to resistant scale and adding Novolog 3 units TID with meals for meal coverage.  Will continue to follow.  Thanks, Orlando Penner, RN, MSN, CCRN Diabetes Coordinator Inpatient Diabetes Program [phone] (Team Pager) [phone] ([facility] office) [phone] [facility] office)

## 2013-02-12 ENCOUNTER — Telehealth: Payer: Self-pay | Admitting: Internal Medicine

## 2013-02-12 NOTE — Progress Notes (Signed)
Patient seen and examined, agree with above note.  I dictated the care and orders written for this patient under my direction.  Jeffery Gammell G Sheelah Ritacco, MD [phone] 

## 2013-02-12 NOTE — Telephone Encounter (Signed)
ATC line busy x 3 wcb 

## 2013-02-13 MED ORDER — PROMETHAZINE-CODEINE 6.25-10 MG/5ML PO SYRP: 5.0000 mL | ORAL_SOLUTION | ORAL | Status: DC | PRN

## 2013-02-13 NOTE — Telephone Encounter (Signed)
Spoke with pharmacist at [facility] and given dx code of 493.90 and 427.31.  Also called in refill for Promethazine with Codeine.  Pt's son informed that this was done.

## 2013-02-13 NOTE — Telephone Encounter (Signed)
Dx asthma with bronchitis, atrial fibrillation  Ok to refill cough syrup

## 2013-02-13 NOTE — Telephone Encounter (Signed)
Pt had ED admission on 02/05/13: He was d/c'd with Levalbuterol 0.63%/3ML 1 vial QID. When they went to the pharmacy they were advised this needs to have DX code on it.  Pt also needs a refill on his promethazine with codeine cough syrup. This was last refilled by [place] 01/08/13 #200 ML QID prn X 0 REFILLS Please advise Dr. Maple Hudson if okay to do so? Thanks Last OV 01/21/13 Pending 07/21/13 --pt d/c note has also been printed off with phone note from hospital admission.

## 2013-02-17 ENCOUNTER — Telehealth: Payer: Self-pay | Admitting: *Deleted

## 2013-02-17 NOTE — Telephone Encounter (Signed)
Left msg on vm stating wanted to let md know went out for admission visit today. Pt had a level 1 interaction. Was rx cardizem ant discharge, but pt already takes mevacor. This is a level 1 interaction...lmb

## 2013-02-17 NOTE — Telephone Encounter (Signed)
Medications and potential interaction reviewed. Indication for each medication also reviewed. No changes recommended in either medication. Continue both Mevacor and Cardizem. thanks

## 2013-02-18 NOTE — Telephone Encounter (Signed)
Called Robin back no answer LMOM with md response...lmb

## 2013-02-20 ENCOUNTER — Telehealth: Payer: Self-pay | Admitting: Internal Medicine

## 2013-02-20 NOTE — Telephone Encounter (Signed)
Last OV 01-21-13. Spoke with the pt physical therapists and she states she saw the pt last week and she saw him today and he loosk worse to her and the pt states he feels worse. He is having a lot of issues clearing his phlegm from his chest, he is c/o increased SOB as well. He has an appt with his PCP next week, but PT and family feel like the pt need to be seen this week and are requesting an appt tomorrow. Please advise. Carron Curie, CMA Allergies  Allergen Reactions  . Asa Buff (Mag [Buffered Aspirin] Nausea Only  . Ibuprofen     REACTION: causes nausea and vomitting

## 2013-02-20 NOTE — Telephone Encounter (Signed)
Per Dr. Maple Hudson:  If he doesn't have an opening for tomorrow, pt can see TP.    ------  Called, spoke with Tiffany.  Informed her of above.  Tiffany verbalized understanding and is requesting we call pt's son, Dorinda Hill, to schedule this at pt's home #.    Called, spoke with Dorinda Hill.  We have scheduled him to see CDY on tomorrow, Oct 10 at 10:30 am.  Dorinda Hill aware and voiced no further questions or concerns at this time.

## 2013-02-21 ENCOUNTER — Encounter: Payer: Self-pay | Admitting: Adult Health

## 2013-02-21 ENCOUNTER — Ambulatory Visit (INDEPENDENT_AMBULATORY_CARE_PROVIDER_SITE_OTHER): Payer: Medicare Other | Admitting: Adult Health

## 2013-02-21 VITALS — BP 104/56 | HR 93 | Temp 96.6°F | Ht 67.0 in | Wt 165.0 lb

## 2013-02-21 DIAGNOSIS — J45909 Unspecified asthma, uncomplicated: Secondary | ICD-10-CM

## 2013-02-21 NOTE — Patient Instructions (Signed)
Continue on Budesonide Neb Twice daily   Continue on Ipratropium Neb Four times a day   Use Albuterol Neb every 4hr as needed for wheezing /shortness of breath.  Mucinex DM Twice daily  As needed  Cough/congestoin  Follow up Dr. Maple Hudson in 6 weeks and As needed   Please contact office for sooner follow up if symptoms do not improve or worsen or seek emergency care

## 2013-02-21 NOTE — Progress Notes (Signed)
Patient ID: [name], male    DOB: 03/11/1921, 79 y.o.   MRN: [id number]  HPI 02/03/11- 89-year-old male never smoker followed for asthma, rhinitis, complicated by AF/pacemaker,/Coumadin, history PE/DVT, DM, HBP, CAD, renal insufficiency...............[name] wife here Last here- October 21, 2009-note reviewed Chest x-ray 10/02/2009 had shown stable basilar scarring otherwise clear lungs with no evidence of fibrosis from his previous amiodarone. He says he feels well, needing to use his nebulizer machine at least once daily through the spring and now in the fall weather. With recent cool weather his ears feel stopped up, decreased hearing, blowing some yellow from nose and chest, throat feels congested. He does not feel tight or wheezy, or short of breath. Appetite is down. He denies fever, blood, swollen glands were swollen feet.  04/07/11-  79 year old male never smoker followed for asthma, rhinitis, complicated by AF/pacemaker,/Coumadin, history PE/DVT, DM, HBP, CAD, renal insufficiency...............[name] wife here He has had flu shot. He says his breathing and allergy problems are "fine", under good control. He complains of his chronic insomnia. He is going to bed at 9:00 but rarely sleepy before 11. His wife gets him up at 6:45 AM. He tried Silenor but it caused headache. Tried cough syrup for sedation but that did not help. Says clonazepam 0.5 mg at bedtime does not help his sleep initiation. This is his chronic pattern, without observation of movement or respiratory disturbance. He does not feel physically uncomfortable in bed.  05/03/2011 Acute OV  Complains of  wheezing,sob,productive cough(yellow and thick). Called in [place] 2 days ago.  No fever or body aches. Coughing is getting worse. Wheezing is worse in early am.  No hemoptysis or chest pain . No edema . Appetite is good.  Feels some better since starting Zpack.   08/10/11-  79 year old male never smoker followed for asthma, rhinitis,  complicated by AF/pacemaker,/Coumadin, history PE/DVT, DM, HBP, CAD, renal insufficiency...............[name] wife here Chest congestion,cough-productive-yellow in color for weeks now-starts and stops; slight wheeze. They say the pattern is not new or changed. Chronic variable productive cough may improve for a few days after antibitotics. No blood and not progressive. Using only a nebulizer machine- dropped off of inhalers. Herat rhythm well controlled on chronic amiodarone w/ pacemaker.   10/11/11- 79 year old male never smoker followed for asthma, rhinitis, complicated by AF/pacemaker,/Coumadin, history PE/DVT, DM, HBP, CAD, renal insufficiency...............[name] wife here She says he has not changed much in the last 2 months. Productive cough with yellow and white "bubbles" but no blood or chest pain. Denies fever or swelling. Nebulizer helps a little with Atrovent used 4 times daily. Advair did not help. He is off of chronic prednisone.  12/11/11 -78 year old male never smoker followed for asthma, rhinitis, complicated by AF/pacemaker,/Coumadin, history PE/DVT, DM, HBP, CAD, renal insufficiency...............[name] wife here Wife thinks his breathing is doing well. Very limited activity due to leg weakness. Gives out quickly with breathing and not sleeping (not due to lung condition) Has no primary care since doctor moved. Dropped off prednisone which did help, but dropped off Advair which did not help. Uses nebulizer 3 times per day.  CXR 6/4 /13 IMPRESSION: Mild chronic changes at the lung bases. Ankylosing  spondylitis.  Original Report Authenticated By: Larey Seat, M.D.   02/12/12- 79 year old male never smoker followed for asthma, rhinitis, complicated by AF/pacemaker,/Coumadin, history PE/DVT, DM, HBP, CAD, renal insufficiency...............[name] wife here Cough-productive-yellow in color(happens all the time) For the past 2 weeks he reports increased nasal congestion. Using saline nasal rinse. Cough  chronically productive of light yellow sputum. No blood, fever or chest pain. Remote history of nasal polyps. He continues prednisone 10 mg daily. COPD assessment test (CABG) 32/40  07/02/12- 79 year old male never smoker followed for asthma, rhinitis, complicated by AF/pacemaker,/Coumadin, history PE/DVT, DM, HBP, CAD, renal insufficiency...............[name] wife here FOLLOWS FOR: increased chest congestion-yellow in color and lots of wheezing per wife, who watches over him pretty well. Primary care gave prednisone for swelling in his hand 2 weeks ago. It did not seem to help his breathing. No acute event. Sputum yellow or white with no blood or at chronic productive cough "no energy". He stopped Advair, preferring his nebulizer machine. He asks refill lorazepam for sleep. We discussed this carefully.  09/05/12- 79 year old male never smoker followed for asthma, rhinitis, complicated by AF/pacemaker,/Coumadin, history PE/DVT, DM, HBP, CAD, renal insufficiency        wife here FOLLOWS FOR: denies any wheezing or SOB. Slight cough but saw PCP and was given RX-stopped cough. No allergy troubles at this time. Dr. Felicity Coyer gave prednisone for arthralgias, 5 mg daily. It has helped his mild chronic cough. Wife states he doesn't "spit" as much. He likes cough syrup and Ativan used for sleep. I discussed these sedating medications and his age. Carefully with them. They both say he does not get confused, over sleepy or unsteady from using these. Bedtime around 8 or 9 PM, up at 6 AM.  01/21/13- 79 year old male never smoker followed for asthma, rhinitis, Insomnia, complicated by AF/pacemaker,/Coumadin, history PE/DVT, DM, HBP, CAD, renal insufficiency        wife here FOLLOWS FOR: Symptoms unchanged since last OV. He complains of feeling unusually weak today, nothing specific. They both say he has chronic cough with yellow sputum that comes right back after antibiotic. Remains on prednisone 5 mg daily and using  nebulizer directed. No change in his chronic insomnia pattern. CXR 09/05/12 IMPRESSION:  Minimal bibasilar atelectasis.  Borderline enlargement of cardiac silhouette post pacemaker.  Original Report Authenticated By: Ulyses Southward, M.D.  02/21/2013 Acute OV  Complains that he had increased SOB, prod cough with occasional white mucus x1day . Had episode of very thick mucus , was able to get up after neb and breathing improved. Feels much better today, back to his baseline.  Was recently admitted for Asthma /COPD flare 3 weeks ago, tx w/ abx and steroids. Did have Atrial fib , and was  recommended to change Albuterol to Xopenex however insurance will not cover.  Denies chest tightness, f/c/s, hemoptysis, nausea, vomiting, edema.   Daughter and wife are with him today.    ROS-see HPI Constitutional:   No  weight loss, night sweats,  Fevers, chills,  +fatigue, or  lassitude.  HEENT:   No headaches,  Difficulty swallowing,  Tooth/dental problems, or  Sore throat,                No sneezing, itching, ear ache, nasal congestion, post nasal drip,   CV:  No chest pain,  Orthopnea, PND,   anasarca, dizziness, palpitations, syncope.   GI  No heartburn, indigestion, abdominal pain, nausea, vomiting, diarrhea, change in bowel habits, loss of appetite, bloody stools.   Resp:      No coughing up of blood.  No change in color of mucus.  No wheezing.  No chest wall deformity  Skin: no rash or lesions.  GU: no dysuria, change in color of urine, no urgency or frequency.  No flank pain, no hematuria   MS:  No joint pain or swelling.  No decreased range of motion.  No back pain.  Psych:  No change in mood or affect. No depression or anxiety.  No memory loss.      OBJ- Physical Exam GEN: A/Ox3; pleasant , NAD, elderly - very "spry"   HEENT:  Andrew/AT,  EACs-clear, TMs-wnl, NOSE-clear, THROAT-clear, no lesions, no postnasal drip or exudate noted.   NECK:  Supple w/ fair ROM; no JVD; normal carotid  impulses w/o bruits; no thyromegaly or nodules palpated; no lymphadenopathy.  RESP  Few faint rhonchi , no accessory muscle use, no dullness to percussion  CARD:  RRR, no m/r/g  , no  peripheral edema, pulses intact, no cyanosis or clubbing.  GI:   Soft & nt; nml bowel sounds; no organomegaly or masses detected.  Musco: Warm bil, no deformities or joint swelling noted.   Neuro: alert, no focal deficits noted.    Skin: Warm, no lesions or rashes

## 2013-02-21 NOTE — Assessment & Plan Note (Addendum)
Recent Asthma flare now resolving  Despite never smoker prefers Atrovent neb , worse off neb .  Will cont budesonide neb Twice daily  , and atrovent neb Four times a day   Will change albuterol to As needed  .   Plan  Continue on Budesonide Neb Twice daily   Continue on Ipratropium Neb Four times a day   Use Albuterol Neb every 4hr as needed for wheezing /shortness of breath.  Mucinex DM Twice daily  As needed  Cough/congestoin  Follow up Dr. Maple Hudson in 6 weeks and As needed   Please contact office for sooner follow up if symptoms do not improve or worsen or seek emergency care

## 2013-02-24 ENCOUNTER — Ambulatory Visit: Payer: Medicare Other | Admitting: Internal Medicine

## 2013-03-06 ENCOUNTER — Other Ambulatory Visit: Payer: Self-pay | Admitting: Internal Medicine

## 2013-03-10 NOTE — Telephone Encounter (Signed)
Ok refill? 

## 2013-03-10 NOTE — Telephone Encounter (Signed)
CY, please advise if okay to refill. Thanks.  

## 2013-03-11 ENCOUNTER — Telehealth: Payer: Self-pay | Admitting: Internal Medicine

## 2013-03-11 NOTE — Telephone Encounter (Signed)
Rx has been called in per CY. 

## 2013-03-11 NOTE — Telephone Encounter (Signed)
Spoke with patient wife-- Informed her that Clonazepam was phoned in 03/06/13 Reports that [facility] verified medication received. Nothing further needed.

## 2013-03-12 ENCOUNTER — Ambulatory Visit (INDEPENDENT_AMBULATORY_CARE_PROVIDER_SITE_OTHER): Payer: Medicare Other | Admitting: Internal Medicine

## 2013-03-12 ENCOUNTER — Encounter: Payer: Self-pay | Admitting: *Deleted

## 2013-03-12 ENCOUNTER — Encounter: Payer: Self-pay | Admitting: Internal Medicine

## 2013-03-12 VITALS — BP 129/73 | HR 90 | Ht 67.0 in

## 2013-03-12 DIAGNOSIS — I5032 Chronic diastolic (congestive) heart failure: Secondary | ICD-10-CM

## 2013-03-12 DIAGNOSIS — I4891 Unspecified atrial fibrillation: Secondary | ICD-10-CM

## 2013-03-12 DIAGNOSIS — Z950 Presence of cardiac pacemaker: Secondary | ICD-10-CM

## 2013-03-12 DIAGNOSIS — I495 Sick sinus syndrome: Secondary | ICD-10-CM

## 2013-03-12 LAB — CBC WITH DIFFERENTIAL/PLATELET
Basophils Absolute: 0 10*3/uL (ref 0.0–0.1)
Basophils Relative: 0.2 % (ref 0.0–3.0)
Eosinophils Absolute: 0.2 10*3/uL (ref 0.0–0.7)
Eosinophils Relative: 1.2 % (ref 0.0–5.0)
Lymphocytes Relative: 11.4 % — ABNORMAL LOW (ref 12.0–46.0)
MCHC: 33.2 g/dL (ref 30.0–36.0)
Monocytes Relative: 6.9 % (ref 3.0–12.0)
Neutrophils Relative %: 80.3 % — ABNORMAL HIGH (ref 43.0–77.0)
RBC: 4.54 Mil/uL (ref 4.22–5.81)
WBC: 14.1 10*3/uL — ABNORMAL HIGH (ref 4.5–10.5)

## 2013-03-12 LAB — PROTIME-INR: INR: 1.7 ratio — ABNORMAL HIGH (ref 0.8–1.0)

## 2013-03-12 LAB — PACEMAKER DEVICE OBSERVATION
AL AMPLITUDE: 2.8 mv
ATRIAL PACING PM: 3
BAMS-0001: 175 {beats}/min
BRDY-0004RV: 110 {beats}/min
RV LEAD IMPEDENCE PM: 475 Ohm
RV LEAD THRESHOLD: 0.75 V

## 2013-03-12 LAB — BASIC METABOLIC PANEL
CO2: 31 mEq/L (ref 19–32)
Calcium: 9.5 mg/dL (ref 8.4–10.5)
Chloride: 99 mEq/L (ref 96–112)
Creatinine, Ser: 1.6 mg/dL — ABNORMAL HIGH (ref 0.4–1.5)
GFR: 42.72 mL/min — ABNORMAL LOW (ref >60.00)
Potassium: 5 mEq/L (ref 3.5–5.1)
Sodium: 140 mEq/L (ref 135–145)

## 2013-03-12 NOTE — Assessment & Plan Note (Signed)
The patient's device was interrogated.  The information was reviewed. No changes were made in the programming.    

## 2013-03-12 NOTE — Patient Instructions (Signed)
Your physician has recommended that you have a Cardioversion (DCCV). Electrical Cardioversion uses a jolt of electricity to your heart either through paddles or wired patches attached to your chest. This is a controlled, usually prescheduled, procedure. Defibrillation is done under light anesthesia in the hospital, and you usually go home the day of the procedure. This is done to get your heart back into a normal rhythm. You are not awake for the procedure. Please see the instruction sheet given to you today.  Your physician recommends that have lab work today.  Your physician recommends that you continue on your current medications as directed. Please refer to the Current Medication list given to you today.

## 2013-03-12 NOTE — Progress Notes (Signed)
Patient Care Team: Newt Lukes, MD as PCP - General (Internal Medicine) [name], MD (Pulmonary Disease) Duke Salvia, MD (Cardiology) Santiago Bumpers, DPM (Podiatry) Quintella Reichert, MD (Cardiology)   HPI  [name] is a 79 y.o. male Seen in followup for ischemic heart disease with normal left ventricular function and an occluded circumflex a few years ago . He also has bradycardia paroxysmal atrial fibrillation and previously implanted pacemaker.   He was recently admitted  For COPD exacerbation complicated by AF RVR  Started on Rivaroxaban  He has had significant weakness since hospitalization and PT aat home is ongoing   Past Medical History  Diagnosis Date  . Presence of permanent cardiac pacemaker 04/2006 upgrade    a. s/p MDT Adapta ADDR01 dual chamber PPM, ser #: [id number] H.  [name] PAF (paroxysmal atrial fibrillation)     a. previously on coumadin-->patient self-d/c'd 11/2012 b/c he was tired of having INR's checked.  . Tachy-brady syndrome   . DM (diabetes mellitus)   . CAD (coronary artery disease)     DES to cx  . Pulmonary embolism 12/2003  . Deep vein thrombophlebitis of leg 2005, 2009    recurrent when off anticoag  . HTN (hypertension)   . Allergic rhinitis   . Asthma   . CKD (chronic kidney disease), stage III   . Nasal polyp     Past Surgical History  Procedure Laterality Date  . Cataract/lens implants    . Nasal polyp surgery    . Coronary angioplasty with stent placement  2008    DES to cx  . Pacemaker insertion  04/2006    Medtronic Adapta    Current Outpatient Prescriptions  Medication Sig Dispense Refill  . budesonide (PULMICORT) 0.25 MG/2ML nebulizer solution Use two times a day       . clonazePAM (KLONOPIN) 0.5 MG tablet TAKE 1 TABLET BY MOUTH AT BEDTIME AS NEEDED  30 tablet  0  . diltiazem (CARDIZEM CD) 360 MG 24 hr capsule Take 1 capsule (360 mg total) by mouth daily.  30 capsule  0  . furosemide (LASIX) 40 MG  tablet Take 1 tablet (40 mg total) by mouth daily.  90 tablet  1  . glipiZIDE (GLUCOTROL) 5 MG tablet Take 1.5 tablets (7.5 mg total) by mouth 2 (two) times daily before a meal.  35 tablet  0  . HYDROcodone-acetaminophen (NORCO/VICODIN) 5-325 MG per tablet Take 0.5-1 tablets by mouth every 8 (eight) hours as needed for pain. prn  30 tablet  5  . ipratropium (ATROVENT) 0.02 % nebulizer solution Take 2.5 mLs (0.5 mg total) by nebulization 4 (four) times daily.  75 mL  12  . levalbuterol (XOPENEX) 0.63 MG/3ML nebulizer solution Take 3 mLs (0.63 mg total) by nebulization 4 (four) times daily.  3 mL  12  . lovastatin (MEVACOR) 40 MG tablet Take 40 mg by mouth at bedtime.      . metoprolol succinate (TOPROL-XL) 25 MG 24 hr tablet Take 1 tablet (25 mg total) by mouth daily.  90 tablet  2  . Multiple Vitamins-Iron (QC DAILY MULTIVITAMINS/IRON) TABS Take 1 tablet by mouth daily.        . nitroGLYCERIN (NITROSTAT) 0.3 MG SL tablet Place 1 tablet (0.3 mg total) under the tongue every 5 (five) minutes as needed.  25 tablet  3  . olopatadine (PATANOL) 0.1 % ophthalmic solution Place 1 drop into both eyes 2 (two) times daily.  5  mL  12  . PredniSONE 5 MG TBEC Take 5 mg by mouth daily.  30 tablet  5  . promethazine-codeine (PHENERGAN WITH CODEINE) 6.25-10 MG/5ML syrup Take 5 mLs by mouth every 4 (four) hours as needed for cough.  120 mL  0  . Rivaroxaban (XARELTO) 15 MG TABS tablet Take 1 tablet (15 mg total) by mouth daily with supper.  30 tablet  0   No current facility-administered medications for this visit.    Allergies  Allergen Reactions  . Asa Buff (Mag [Buffered Aspirin] Nausea Only  . Ibuprofen     REACTION: causes nausea and vomitting    Review of Systems negative except from HPI and PMH  Physical Exam BP 129/73  Pulse 90  Ht 5\' 7"  (1.702 m) Well developed and well nourished in no acute distress HENT normal E scleral and icterus clear Neck Supple   Clear to ausculation fasst and  irregular Soft with active bowel sounds No clubbing cyanosis none Edema Alert and oriented, grossly normal motor and sensory function sitting in wheeel chair Skin Warm and Dry    Assessment and  Plan

## 2013-03-12 NOTE — Assessment & Plan Note (Addendum)
He is euvolemic. Will continue current medications. Based on the  RACE trial we will leave his heart rate along

## 2013-03-12 NOTE — Assessment & Plan Note (Addendum)
Persistent atrial fibrillation. We will undertake cardioversion as this will hopefully help his exercise intolerance and weakness. He is on Rivaroxaban

## 2013-03-13 ENCOUNTER — Encounter: Payer: Self-pay | Admitting: Internal Medicine

## 2013-03-14 ENCOUNTER — Encounter (HOSPITAL_COMMUNITY): Payer: Self-pay | Admitting: Pharmacist

## 2013-03-17 ENCOUNTER — Telehealth: Payer: Self-pay

## 2013-03-17 ENCOUNTER — Other Ambulatory Visit: Payer: Self-pay

## 2013-03-17 MED ORDER — RIVAROXABAN 15 MG PO TABS: 15.0000 mg | ORAL_TABLET | Freq: Every day | ORAL | Status: DC

## 2013-03-17 MED ORDER — DILTIAZEM HCL ER COATED BEADS 360 MG PO CP24: 360.0000 mg | ORAL_CAPSULE | Freq: Every day | ORAL | Status: DC

## 2013-03-17 NOTE — Telephone Encounter (Signed)
refill 

## 2013-03-18 ENCOUNTER — Telehealth: Payer: Self-pay | Admitting: Internal Medicine

## 2013-03-18 NOTE — Telephone Encounter (Signed)
New Problem:  Lupita Leash states the pt isn't feeling well.[name] Resting heart rate is 120-130. Lupita Leash states the pt was out of  Cardizem for about 3 days but just took one in her presence after getting it filled... Please advise

## 2013-03-18 NOTE — Telephone Encounter (Signed)
SPOKE  WITH DONNA  PT   HAS  NOT  TAKEN  CARDIZEM SINCE SAT OR  SUN   RAN OUT OF MED  RESTARTED TODAY  INSTRUCTED  FOR PT  TO  CONT TO TAKE  MEDS  AS DIRECTED  AND TO MONITOR  HEART RATE .Zack Seal

## 2013-03-19 MED ORDER — SODIUM CHLORIDE 0.9 % IV SOLN
INTRAVENOUS | Status: DC
  Administered 2013-03-20: 500 mL via INTRAVENOUS

## 2013-03-20 ENCOUNTER — Other Ambulatory Visit: Payer: Self-pay | Admitting: Internal Medicine

## 2013-03-20 ENCOUNTER — Ambulatory Visit (HOSPITAL_COMMUNITY): Payer: Medicare Other | Admitting: Anesthesiology

## 2013-03-20 ENCOUNTER — Encounter (HOSPITAL_COMMUNITY)
Admission: RE | Disposition: A | Discharge status: Home / Self Care | Payer: Self-pay | Source: Ambulatory Visit | Attending: Cardiology

## 2013-03-20 ENCOUNTER — Encounter (HOSPITAL_COMMUNITY): Payer: Medicare Other | Admitting: Anesthesiology

## 2013-03-20 ENCOUNTER — Encounter (HOSPITAL_COMMUNITY): Payer: Self-pay

## 2013-03-20 ENCOUNTER — Ambulatory Visit (HOSPITAL_COMMUNITY)
Admission: RE | Admit: 2013-03-20 | Discharge: 2013-03-20 | Disposition: A | Discharge status: Home / Self Care | Payer: Medicare Other | Source: Ambulatory Visit | Attending: Cardiology | Admitting: Cardiology

## 2013-03-20 DIAGNOSIS — E119 Type 2 diabetes mellitus without complications: Secondary | ICD-10-CM | POA: Insufficient documentation

## 2013-03-20 DIAGNOSIS — I739 Peripheral vascular disease, unspecified: Secondary | ICD-10-CM | POA: Insufficient documentation

## 2013-03-20 DIAGNOSIS — I495 Sick sinus syndrome: Secondary | ICD-10-CM | POA: Insufficient documentation

## 2013-03-20 DIAGNOSIS — I129 Hypertensive chronic kidney disease with stage 1 through stage 4 chronic kidney disease, or unspecified chronic kidney disease: Secondary | ICD-10-CM | POA: Insufficient documentation

## 2013-03-20 DIAGNOSIS — N183 Chronic kidney disease, stage 3 unspecified: Secondary | ICD-10-CM | POA: Insufficient documentation

## 2013-03-20 DIAGNOSIS — I4891 Unspecified atrial fibrillation: Secondary | ICD-10-CM | POA: Insufficient documentation

## 2013-03-20 DIAGNOSIS — I2510 Atherosclerotic heart disease of native coronary artery without angina pectoris: Secondary | ICD-10-CM | POA: Insufficient documentation

## 2013-03-20 DIAGNOSIS — I259 Chronic ischemic heart disease, unspecified: Secondary | ICD-10-CM | POA: Insufficient documentation

## 2013-03-20 DIAGNOSIS — Z950 Presence of cardiac pacemaker: Secondary | ICD-10-CM | POA: Insufficient documentation

## 2013-03-20 DIAGNOSIS — I5032 Chronic diastolic (congestive) heart failure: Secondary | ICD-10-CM

## 2013-03-20 HISTORY — PX: CARDIOVERSION: SHX1299

## 2013-03-20 SURGERY — CARDIOVERSION
Anesthesia: Monitor Anesthesia Care

## 2013-03-20 MED ORDER — LIDOCAINE HCL (CARDIAC) 20 MG/ML IV SOLN
INTRAVENOUS | Status: DC | PRN
  Administered 2013-03-20: 80 mg via INTRAVENOUS

## 2013-03-20 MED ORDER — PROPOFOL 10 MG/ML IV BOLUS
INTRAVENOUS | Status: DC | PRN
  Administered 2013-03-20: 70 mg via INTRAVENOUS

## 2013-03-20 MED ORDER — SODIUM CHLORIDE 0.9 % IV SOLN
INTRAVENOUS | Status: DC | PRN
  Administered 2013-03-20: 13:00:00 via INTRAVENOUS

## 2013-03-20 NOTE — H&P (Signed)
Patient Care Team: Newt Lukes, MD as PCP - General (Internal Medicine) [name], MD (Pulmonary Disease) Duke Salvia, MD (Cardiology) Santiago Bumpers, DPM (Podiatry) Quintella Reichert, MD (Cardiology)      HPI   Patient is here for cardioversion today March 20, 2013. Below is the most recent progress note from Dr. Graciela Husbands. Plans were made carefully for cardioversion. The patient is stable. There is no change in his physical exam. The pacemaker representative will be available to help.     [name] is a 79 y.o. male Seen in followup for ischemic heart disease with normal left ventricular function and an occluded circumflex a few years ago . He also has bradycardia paroxysmal atrial fibrillation and previously implanted pacemaker.    He was recently admitted  For COPD exacerbation complicated by AF RVR  Started on Rivaroxaban   He has had significant weakness since hospitalization and PT aat home is ongoing      Past Medical History   Diagnosis  Date   .  Presence of permanent cardiac pacemaker  04/2006 upgrade       a. s/p MDT Adapta ADDR01 dual chamber PPM, ser #: [id number] H.   [name]  PAF (paroxysmal atrial fibrillation)         a. previously on coumadin-->patient self-d/c'd 11/2012 b/c he was tired of having INR's checked.   .  Tachy-brady syndrome     .  DM (diabetes mellitus)     .  CAD (coronary artery disease)         DES to cx   .  Pulmonary embolism  12/2003   .  Deep vein thrombophlebitis of leg  2005, 2009       recurrent when off anticoag   .  HTN (hypertension)     .  Allergic rhinitis     .  Asthma     .  CKD (chronic kidney disease), stage III     .  Nasal polyp           Past Surgical History   Procedure  Laterality  Date   .  Cataract/lens implants       .  Nasal polyp surgery       .  Coronary angioplasty with stent placement    2008       DES to cx   .  Pacemaker insertion    04/2006       Medtronic Adapta          Current Outpatient Prescriptions   Medication  Sig  Dispense  Refill   .  budesonide (PULMICORT) 0.25 MG/2ML nebulizer solution  Use two times a day          .  clonazePAM (KLONOPIN) 0.5 MG tablet  TAKE 1 TABLET BY MOUTH AT BEDTIME AS NEEDED   30 tablet   0   .  diltiazem (CARDIZEM CD) 360 MG 24 hr capsule  Take 1 capsule (360 mg total) by mouth daily.   30 capsule   0   .  furosemide (LASIX) 40 MG tablet  Take 1 tablet (40 mg total) by mouth daily.   90 tablet   1   .  glipiZIDE (GLUCOTROL) 5 MG tablet  Take 1.5 tablets (7.5 mg total) by mouth 2 (two) times daily before a meal.   35 tablet   0   .  HYDROcodone-acetaminophen (NORCO/VICODIN) 5-325 MG per tablet  Take 0.5-1 tablets by mouth every 8 (eight) hours as needed for pain. prn   30 tablet   5   .  ipratropium (ATROVENT) 0.02 % nebulizer solution  Take 2.5 mLs (0.5 mg total) by nebulization 4 (four) times daily.   75 mL   12   .  levalbuterol (XOPENEX) 0.63 MG/3ML nebulizer solution  Take 3 mLs (0.63 mg total) by nebulization 4 (four) times daily.   3 mL   12   .  lovastatin (MEVACOR) 40 MG tablet  Take 40 mg by mouth at bedtime.         .  metoprolol succinate (TOPROL-XL) 25 MG 24 hr tablet  Take 1 tablet (25 mg total) by mouth daily.   90 tablet   2   .  Multiple Vitamins-Iron (QC DAILY MULTIVITAMINS/IRON) TABS  Take 1 tablet by mouth daily.           .  nitroGLYCERIN (NITROSTAT) 0.3 MG SL tablet  Place 1 tablet (0.3 mg total) under the tongue every 5 (five) minutes as needed.   25 tablet   3   .  olopatadine (PATANOL) 0.1 % ophthalmic solution  Place 1 drop into both eyes 2 (two) times daily.   5 mL   12   .  PredniSONE 5 MG TBEC  Take 5 mg by mouth daily.   30 tablet   5   .  promethazine-codeine (PHENERGAN WITH CODEINE) 6.25-10 MG/5ML syrup  Take 5 mLs by mouth every 4 (four) hours as needed for cough.   120 mL   0   .  Rivaroxaban (XARELTO) 15 MG TABS tablet  Take 1 tablet (15 mg total) by mouth daily with supper.   30 tablet    0       No current facility-administered medications for this visit.         Allergies   Allergen  Reactions   .  Asa Buff (Mag [Buffered Aspirin]  Nausea Only   .  Ibuprofen         REACTION: causes nausea and vomitting        Review of Systems negative except from HPI and PMH   Physical Exam BP 129/73  Pulse 90  Ht 5\' 7"  (1.702 m) Well developed and well nourished in no acute distress HENT normal E scleral and icterus clear Neck Supple    Clear to ausculation fasst and irregular Soft with active bowel sounds No clubbing cyanosis none Edema Alert and oriented, grossly normal motor and sensory function sitting in wheeel chair Skin Warm and Dry       Assessment and  Plan            Atrial fibrillation - Duke Salvia, MD at 03/12/2013 12:38 PM    Status: [name] Related Problem: Atrial fibrillation    Persistent atrial fibrillation. We will undertake cardioversion as this will hopefully help his exercise intolerance and weakness. He is on Rivaroxaban

## 2013-03-20 NOTE — Telephone Encounter (Signed)
Please advise if okay to refill. Thanks.  

## 2013-03-20 NOTE — Anesthesia Postprocedure Evaluation (Signed)
  Anesthesia Post-op Note  Patient: [name]  Procedure(s) Performed: Procedure(s): CARDIOVERSION (N/A)  Patient Location: PACU  Anesthesia Type:General  Level of Consciousness: awake, alert , oriented and patient cooperative  Airway and Oxygen Therapy: Patient Spontanous Breathing and Patient connected to nasal cannula oxygen  Post-op Pain: none  Post-op Assessment: Post-op Vital signs reviewed, Patient's Cardiovascular Status Stable, Respiratory Function Stable, Patent Airway and No signs of Nausea or vomiting  Post-op Vital Signs: Reviewed and stable  Complications: No apparent anesthesia complications

## 2013-03-20 NOTE — CV Procedure (Signed)
Cardioversion  Consent signed. Timeout done. Anesthesia present. Anterior-posterior pads placed...biphasic defibrillator  Pt received 70mg  IV Propofol  One shock 120joules.  NSR  Tolerated well.  Jerral Bonito, MD

## 2013-03-20 NOTE — Transfer of Care (Signed)
Immediate Anesthesia Transfer of Care Note  Patient: [name]  Procedure(s) Performed: Procedure(s): CARDIOVERSION (N/A)  Patient Location: PACU  Anesthesia Type:General  Level of Consciousness: awake, alert , oriented and patient cooperative  Airway & Oxygen Therapy: Patient Spontanous Breathing and Patient connected to nasal cannula oxygen  Post-op Assessment: Report given to PACU RN, Post -op Vital signs reviewed and stable and Patient moving all extremities  Post vital signs: Reviewed and stable  Complications: No apparent anesthesia complications

## 2013-03-20 NOTE — Anesthesia Preprocedure Evaluation (Addendum)
Anesthesia Evaluation  Patient identified by MRN, date of birth, ID band Patient awake    Reviewed: Allergy & Precautions, H&P , NPO status , Patient's Chart, lab work & pertinent test results, reviewed documented beta blocker date and time   Airway Mallampati: II TM Distance: >3 FB Neck ROM: Full    Dental  (+) Teeth Intact   Pulmonary asthma ,          Cardiovascular hypertension, Pt. on home beta blockers + CAD and + Peripheral Vascular Disease + dysrhythmias Atrial Fibrillation + pacemaker     Neuro/Psych    GI/Hepatic   Endo/Other  diabetes, Type 2, Oral Hypoglycemic Agents  Renal/GU      Musculoskeletal   Abdominal   Peds  Hematology   Anesthesia Other Findings   Reproductive/Obstetrics                           Anesthesia Physical Anesthesia Plan  ASA: III  Anesthesia Plan: MAC   Post-op Pain Management:    Induction: Intravenous  Airway Management Planned: Mask  Additional Equipment:   Intra-op Plan:   Post-operative Plan:   Informed Consent: I have reviewed the patients History and Physical, chart, labs and discussed the procedure including the risks, benefits and alternatives for the proposed anesthesia with the patient or authorized representative who has indicated his/her understanding and acceptance.     Plan Discussed with: CRNA, Anesthesiologist and Surgeon  Anesthesia Plan Comments:         Anesthesia Quick Evaluation

## 2013-03-20 NOTE — Telephone Encounter (Signed)
Ok to refill 

## 2013-03-21 ENCOUNTER — Other Ambulatory Visit: Payer: Self-pay | Admitting: Internal Medicine

## 2013-03-21 NOTE — Telephone Encounter (Signed)
Called refill to pharmacy voicemail.  

## 2013-03-24 ENCOUNTER — Encounter (HOSPITAL_COMMUNITY): Payer: Self-pay | Admitting: Cardiology

## 2013-03-27 ENCOUNTER — Telehealth: Payer: Self-pay | Admitting: Internal Medicine

## 2013-03-27 ENCOUNTER — Other Ambulatory Visit: Payer: Self-pay | Admitting: *Deleted

## 2013-03-27 MED ORDER — FUROSEMIDE 40 MG PO TABS: 40.0000 mg | ORAL_TABLET | Freq: Every day | ORAL | Status: DC

## 2013-03-27 NOTE — Telephone Encounter (Signed)
Spoke with pharmacist at [facility] at they verified that rx for Phenergan with Codeine is ready for pick up.  Spoke with pt's wife and notified that rx was ready for pick up.

## 2013-04-08 ENCOUNTER — Ambulatory Visit (INDEPENDENT_AMBULATORY_CARE_PROVIDER_SITE_OTHER): Payer: Medicare Other | Admitting: Internal Medicine

## 2013-04-08 ENCOUNTER — Encounter: Payer: Self-pay | Admitting: Internal Medicine

## 2013-04-08 VITALS — BP 112/64 | HR 138 | Ht 67.0 in | Wt 165.0 lb

## 2013-04-08 DIAGNOSIS — J45909 Unspecified asthma, uncomplicated: Secondary | ICD-10-CM

## 2013-04-08 DIAGNOSIS — G4700 Insomnia, unspecified: Secondary | ICD-10-CM

## 2013-04-08 DIAGNOSIS — J4540 Moderate persistent asthma, uncomplicated: Secondary | ICD-10-CM

## 2013-04-08 NOTE — Patient Instructions (Signed)
Reduce the clonazepam/ Klonopin, so you only take 1 pill at night for sleep. This sedating medicine is getting you too sleepy in the daytime, and may be causing you to get confused at night.

## 2013-04-08 NOTE — Progress Notes (Signed)
Patient ID: [name], male    DOB: 03/11/1921, 79 y.o.   MRN: [id number]  HPI 02/03/11- 89-year-old male never smoker followed for asthma, rhinitis, complicated by AF/pacemaker,/Coumadin, history PE/DVT, DM, HBP, CAD, renal insufficiency...............[name] wife here Last here- October 21, 2009-note reviewed Chest x-ray 10/02/2009 had shown stable basilar scarring otherwise clear lungs with no evidence of fibrosis from his previous amiodarone. He says he feels well, needing to use his nebulizer machine at least once daily through the spring and now in the fall weather. With recent cool weather his ears feel stopped up, decreased hearing, blowing some yellow from nose and chest, throat feels congested. He does not feel tight or wheezy, or short of breath. Appetite is down. He denies fever, blood, swollen glands were swollen feet.  04/07/11-  79 year old male never smoker followed for asthma, rhinitis, complicated by AF/pacemaker,/Coumadin, history PE/DVT, DM, HBP, CAD, renal insufficiency...............[name] wife here He has had flu shot. He says his breathing and allergy problems are "fine", under good control. He complains of his chronic insomnia. He is going to bed at 9:00 but rarely sleepy before 11. His wife gets him up at 6:45 AM. He tried Silenor but it caused headache. Tried cough syrup for sedation but that did not help. Says clonazepam 0.5 mg at bedtime does not help his sleep initiation. This is his chronic pattern, without observation of movement or respiratory disturbance. He does not feel physically uncomfortable in bed.  05/03/2011 Acute OV  Complains of  wheezing,sob,productive cough(yellow and thick). Called in [place] 2 days ago.  No fever or body aches. Coughing is getting worse. Wheezing is worse in early am.  No hemoptysis or chest pain . No edema . Appetite is good.  Feels some better since starting Zpack.   08/10/11-  79 year old male never smoker followed for asthma, rhinitis,  complicated by AF/pacemaker,/Coumadin, history PE/DVT, DM, HBP, CAD, renal insufficiency...............[name] wife here Chest congestion,cough-productive-yellow in color for weeks now-starts and stops; slight wheeze. They say the pattern is not new or changed. Chronic variable productive cough may improve for a few days after antibitotics. No blood and not progressive. Using only a nebulizer machine- dropped off of inhalers. [place]at rhythm well controlled on chronic amiodarone w/ pacemaker.   10/11/11- 79 year old male never smoker followed for asthma, rhinitis, complicated by AF/pacemaker,/Coumadin, history PE/DVT, DM, HBP, CAD, renal insufficiency...............[name] wife here She says he has not changed much in the last 2 months. Productive cough with yellow and white "bubbles" but no blood or chest pain. Denies fever or swelling. Nebulizer helps a little with Atrovent used 4 times daily. Advair did not help. He is off of chronic prednisone.  12/11/11 -78 year old male never smoker followed for asthma, rhinitis, complicated by AF/pacemaker,/Coumadin, history PE/DVT, DM, HBP, CAD, renal insufficiency...............[name] wife here Wife thinks his breathing is doing well. Very limited activity due to leg weakness. Gives out quickly with breathing and not sleeping (not due to lung condition) Has no primary care since doctor moved. Dropped off prednisone which did help, but dropped off Advair which did not help. Uses nebulizer 3 times per day.  CXR 6/4 /13 IMPRESSION: Mild chronic changes at the lung bases. Ankylosing  spondylitis.  Original Report Authenticated By: Larey Seat, M.D.   02/12/12- 79 year old male never smoker followed for asthma, rhinitis, complicated by AF/pacemaker,/Coumadin, history PE/DVT, DM, HBP, CAD, renal insufficiency...............[name] wife here Cough-productive-yellow in color(happens all the time) For the past 2 weeks he reports increased nasal congestion. Using saline nasal rinse. Cough  chronically productive of light yellow sputum. No blood, fever or chest pain. Remote history of nasal polyps. He continues prednisone 10 mg daily. COPD assessment test (CABG) 32/40  07/02/12- 79 year old male never smoker followed for asthma, rhinitis, complicated by AF/pacemaker,/Coumadin, history PE/DVT, DM, HBP, CAD, renal insufficiency...............[name] wife here FOLLOWS FOR: increased chest congestion-yellow in color and lots of wheezing per wife, who watches over him pretty well. Primary care gave prednisone for swelling in his hand 2 weeks ago. It did not seem to help his breathing. No acute event. Sputum yellow or white with no blood or at chronic productive cough "no energy". He stopped Advair, preferring his nebulizer machine. He asks refill lorazepam for sleep. We discussed this carefully.  09/05/12- 79 year old male never smoker followed for asthma, rhinitis, complicated by AF/pacemaker,/Coumadin, history PE/DVT, DM, HBP, CAD, renal insufficiency        wife here FOLLOWS FOR: denies any wheezing or SOB. Slight cough but saw PCP and was given RX-stopped cough. No allergy troubles at this time. Dr. Felicity Coyer gave prednisone for arthralgias, 5 mg daily. It has helped his mild chronic cough. Wife states he doesn't "spit" as much. He likes cough syrup and Ativan used for sleep. I discussed these sedating medications and his age. Carefully with them. They both say he does not get confused, over sleepy or unsteady from using these. Bedtime around 8 or 9 PM, up at 6 AM.  01/21/13- 79 year old male never smoker followed for asthma, rhinitis, Insomnia, complicated by AF/pacemaker,/Coumadin, history PE/DVT, DM, HBP, CAD, renal insufficiency        wife here FOLLOWS FOR: Symptoms unchanged since last OV. He complains of feeling unusually weak today, nothing specific. They both say he has chronic cough with yellow sputum that comes right back after antibiotic. Remains on prednisone 5 mg daily and using  nebulizer directed. No change in his chronic insomnia pattern. CXR 09/05/12 IMPRESSION:  Minimal bibasilar atelectasis.  Borderline enlargement of cardiac silhouette post pacemaker.  Original Report Authenticated By: Ulyses Southward, M.D.  02/21/2013 Acute OV  Complains that he had increased SOB, prod cough with occasional white mucus x1day . Had episode of very thick mucus , was able to get up after neb and breathing improved. Feels much better today, back to his baseline.  Was recently admitted for Asthma /COPD flare 3 weeks ago, tx w/ abx and steroids. Did have Atrial fib , and was  recommended to change Albuterol to Xopenex however insurance will not cover.  Denies chest tightness, f/c/s, hemoptysis, nausea, vomiting, edema.   Daughter and wife are with him today.   04/08/13-79 year old male never smoker followed for asthma, rhinitis, Insomnia, complicated by AF/pacemaker,/Coumadin, history PE/DVT, DM, HBP, CAD, renal insufficiency        wife here Wife notices he gets confused some and I pushed the issue of his sedating medications. He lies awake at night but sleepy in the daytime. We discussed sleep schedule and sleep habit. After his pacemaker was adjusted he stopped "spitting" and has no chest rattle, less cough. CXR 02/05/13 IMPRESSION:  Mild opacity left lung base most compatible with atelectasis.  Electronically Signed  By: Drusilla Kanner M.D.  On: 02/05/2013 06:30   ROS-see HPI Constitutional:   No-   weight loss, night sweats, fevers, chills, +fatigue, lassitude. HEENT:   No-  headaches, difficulty swallowing, tooth/dental problems, sore throat,       No-  sneezing, itching, ear ache, nasal congestion, post nasal drip,  CV:  No-   chest pain,  orthopnea, PND, swelling in lower extremities, anasarca, dizziness, palpitations Resp: No-   shortness of breath with exertion or at rest.              No-   productive cough,  No non-productive cough,  No- coughing up of blood.               No-   change in color of mucus.  No- wheezing.   Skin: No-   rash or lesions. GI:  No-   heartburn, indigestion, abdominal pain, nausea, vomiting, GU:  MS:  No-   joint pain or swelling.   Neuro-     nothing unusual Psych:  No- change in mood or affect. No depression or anxiety.  No memory loss.  OBJ- Physical Exam General- Alert, Oriented, Affect-appropriate, Distress- none acute. Wheelchair Skin- rash-none, lesions- none, excoriation- none Lymphadenopathy- none Head- atraumatic            Eyes- Gross vision intact, PERRLA, conjunctivae and secretions clear            Ears- Hearing, canals-normal            Nose- Clear, no-Septal dev, mucus, polyps, erosion, perforation             Throat- Mallampati II , mucosa clear , drainage- none, tonsils- atrophic Neck- flexible , trachea midline, no stridor , thyroid nl, carotid no bruit Chest - symmetrical excursion , unlabored           Heart/CV- IRR , no murmur , no gallop  , no rub, nl s1 s2                           - JVD- none , edema- none, stasis changes- none, varices- none           Lung- clear to P&A, wheeze+, cough- none , dullness-none, rub- none           Chest wall- pacemaker Abd-  Br/ Gen/ Rectal- Not done, not indicated Extrem- cyanosis- none, clubbing, none, atrophy- none, strength- nl Neuro- grossly intact to observation

## 2013-04-22 ENCOUNTER — Other Ambulatory Visit: Payer: Self-pay

## 2013-04-22 ENCOUNTER — Emergency Department (HOSPITAL_COMMUNITY)
Admission: EM | Admit: 2013-04-22 | Discharge: 2013-04-22 | Disposition: A | Discharge status: Home / Self Care | Payer: Medicare Other | Attending: Emergency Medicine | Admitting: Emergency Medicine

## 2013-04-22 ENCOUNTER — Emergency Department (HOSPITAL_COMMUNITY): Payer: Medicare Other

## 2013-04-22 ENCOUNTER — Encounter (HOSPITAL_COMMUNITY): Payer: Self-pay | Admitting: Emergency Medicine

## 2013-04-22 DIAGNOSIS — J45909 Unspecified asthma, uncomplicated: Secondary | ICD-10-CM | POA: Insufficient documentation

## 2013-04-22 DIAGNOSIS — S8391XA Sprain of unspecified site of right knee, initial encounter: Secondary | ICD-10-CM

## 2013-04-22 DIAGNOSIS — Z86711 Personal history of pulmonary embolism: Secondary | ICD-10-CM | POA: Insufficient documentation

## 2013-04-22 DIAGNOSIS — W19XXXA Unspecified fall, initial encounter: Secondary | ICD-10-CM

## 2013-04-22 DIAGNOSIS — I4891 Unspecified atrial fibrillation: Secondary | ICD-10-CM | POA: Insufficient documentation

## 2013-04-22 DIAGNOSIS — Z79899 Other long term (current) drug therapy: Secondary | ICD-10-CM | POA: Insufficient documentation

## 2013-04-22 DIAGNOSIS — R296 Repeated falls: Secondary | ICD-10-CM | POA: Insufficient documentation

## 2013-04-22 DIAGNOSIS — IMO0002 Reserved for concepts with insufficient information to code with codable children: Secondary | ICD-10-CM | POA: Insufficient documentation

## 2013-04-22 DIAGNOSIS — Z7901 Long term (current) use of anticoagulants: Secondary | ICD-10-CM | POA: Insufficient documentation

## 2013-04-22 DIAGNOSIS — Z86718 Personal history of other venous thrombosis and embolism: Secondary | ICD-10-CM | POA: Insufficient documentation

## 2013-04-22 DIAGNOSIS — E119 Type 2 diabetes mellitus without complications: Secondary | ICD-10-CM | POA: Insufficient documentation

## 2013-04-22 DIAGNOSIS — N183 Chronic kidney disease, stage 3 unspecified: Secondary | ICD-10-CM | POA: Insufficient documentation

## 2013-04-22 DIAGNOSIS — I2510 Atherosclerotic heart disease of native coronary artery without angina pectoris: Secondary | ICD-10-CM | POA: Insufficient documentation

## 2013-04-22 DIAGNOSIS — Z9861 Coronary angioplasty status: Secondary | ICD-10-CM | POA: Insufficient documentation

## 2013-04-22 DIAGNOSIS — I129 Hypertensive chronic kidney disease with stage 1 through stage 4 chronic kidney disease, or unspecified chronic kidney disease: Secondary | ICD-10-CM | POA: Insufficient documentation

## 2013-04-22 DIAGNOSIS — Y9389 Activity, other specified: Secondary | ICD-10-CM | POA: Insufficient documentation

## 2013-04-22 DIAGNOSIS — S50312A Abrasion of left elbow, initial encounter: Secondary | ICD-10-CM

## 2013-04-22 DIAGNOSIS — Z95810 Presence of automatic (implantable) cardiac defibrillator: Secondary | ICD-10-CM | POA: Insufficient documentation

## 2013-04-22 DIAGNOSIS — Y92009 Unspecified place in unspecified non-institutional (private) residence as the place of occurrence of the external cause: Secondary | ICD-10-CM | POA: Insufficient documentation

## 2013-04-22 LAB — BASIC METABOLIC PANEL
CO2: 27 mEq/L (ref 19–32)
Calcium: 9.3 mg/dL (ref 8.4–10.5)
Chloride: 97 mEq/L (ref 96–112)
Creatinine, Ser: 1.77 mg/dL — ABNORMAL HIGH (ref 0.50–1.35)
GFR calc Af Amer: 37 mL/min — ABNORMAL LOW (ref >90)
Sodium: 137 mEq/L (ref 135–145)

## 2013-04-22 LAB — POCT I-STAT TROPONIN I: Troponin i, poc: 0.01 ng/mL (ref 0.00–0.08)

## 2013-04-22 LAB — CBC WITH DIFFERENTIAL/PLATELET
Basophils Absolute: 0 10*3/uL (ref 0.0–0.1)
Basophils Relative: 0 % (ref 0–1)
Eosinophils Relative: 0 % (ref 0–5)
Hemoglobin: 13.5 g/dL (ref 13.0–17.0)
Lymphocytes Relative: 6 % — ABNORMAL LOW (ref 12–46)
MCH: 32.1 pg (ref 26.0–34.0)
MCHC: 33.4 g/dL (ref 30.0–36.0)
Neutro Abs: 11.2 10*3/uL — ABNORMAL HIGH (ref 1.7–7.7)
Neutrophils Relative %: 88 % — ABNORMAL HIGH (ref 43–77)
Platelets: 219 10*3/uL (ref 150–400)
RDW: 13.9 % (ref 11.5–15.5)
WBC: 12.7 10*3/uL — ABNORMAL HIGH (ref 4.0–10.5)

## 2013-04-22 MED ORDER — DILTIAZEM HCL 30 MG PO TABS
30.0000 mg | ORAL_TABLET | Freq: Once | ORAL | Status: AC
  Administered 2013-04-22: 30 mg via ORAL
  Filled 2013-04-22: qty 1

## 2013-04-22 NOTE — ED Provider Notes (Signed)
Patient reports 2 days ago he was bending over while using his walker to pick up a dog toy and he fell. He reports since then he's had pain in his right knee and he has had difficulty ambulating.He denies hitting his head or LOC. He also has a skin tear to his left elbow.   Pleasant elderly male, HOH. No deformity noted to his right knee. Has large skin tear and bruising of his left elbow without infection.    Medical screening examination/treatment/procedure(s) were conducted as a shared visit with non-physician practitioner(s) and myself.  I personally evaluated the patient during the encounter.  EKG Interpretation    Date/Time:  Tuesday April 22 2013 13:31:14 EST Ventricular Rate:  122 PR Interval:    QRS Duration: 124 QT Interval:  362 QTC Calculation: 516 R Axis:   -75 Text Interpretation:  Age not entered, assumed to be  79 years old for purpose of ECG interpretation Atrial flutter RBBB and LAFB Nonspecific ST and T wave abnormality Since last tracing rate faster Confirmed by Kewanna Kasprzak  MD-I, Loula Marcella ([id number]) on 04/22/2013 3:45:32 PM             Vital signs normal    Ward Givens, MD 04/22/13 1548

## 2013-04-22 NOTE — ED Provider Notes (Signed)
See prior note   Ward Givens, MD 04/22/13 1943

## 2013-04-22 NOTE — ED Notes (Signed)
ems cbg 304.

## 2013-04-22 NOTE — ED Notes (Signed)
Pt arrives to ed via [facility] for right pain.  ems sts pt had mechanical fall Saturday at home. ems sts pt wants to be evaluated for right leg pain. Pt also has skin tear on left FA.  Pt caox4, pmsx4, no obvious deformities.  Pt denies loc.

## 2013-04-22 NOTE — ED Provider Notes (Signed)
CSN: [id number]     Arrival date & time 04/22/13  1322 History   First MD Initiated Contact with Patient 04/22/13 1323     Chief Complaint  Patient presents with  . Leg Pain   (Consider location/radiation/quality/duration/timing/severity/associated sxs/prior Treatment) HPI [name] is a 79 y.o. male with multiple medical comorbidities, including afib, CAD, prior PE, defibulator/pacemaker, DM, CKD, presents to ED with complaint of a fall 3 days ago. Pt states he leaned forward to pick a dog toy when he lost balance and fell forward. Pt states he normally walks with a walker and has poor balance. He denies hitting his head or having LOC. His wife witnessed the fall and stated "he fell pretty hard." States he was unable to get up and unable to walk since. He has a skin tear to the left elbow and pain in right knee. Pt states pain is minimal when laying down but unable to bear weight on right leg. He denies any dizziness or weakness just prior to the fall. Denies CP, SOB. States taking all his medications, took all this morning, unsure if took cardizem.   Past Medical History  Diagnosis Date  . Presence of permanent cardiac pacemaker 04/2006 upgrade    a. s/p MDT Adapta ADDR01 dual chamber PPM, ser #: [id number] H.  [name] PAF (paroxysmal atrial fibrillation)     a. previously on coumadin-->patient self-d/c'd 11/2012 b/c he was tired of having INR's checked.  . Tachy-brady syndrome   . DM (diabetes mellitus)   . CAD (coronary artery disease)     DES to cx  . Pulmonary embolism 12/2003  . Deep vein thrombophlebitis of leg 2005, 2009    recurrent when off anticoag  . HTN (hypertension)   . Allergic rhinitis   . Asthma   . CKD (chronic kidney disease), stage III   . Nasal polyp    Past Surgical History  Procedure Laterality Date  . Cataract/lens implants    . Nasal polyp surgery    . Coronary angioplasty with stent placement  2008    DES to cx  . Pacemaker insertion  04/2006     Medtronic Adapta  . Cardioversion N/A 03/20/2013    Procedure: CARDIOVERSION;  Surgeon: Luis Abed, MD;  Location: [facility] ENDOSCOPY;  Service: Cardiovascular;  Laterality: N/A;   Family History  Problem Relation Age of Onset  . Diabetes Neg Hx   . Hypertension Neg Hx   . Coronary artery disease Neg Hx    History  Substance Use Topics  . Smoking status: Never Smoker   . Smokeless tobacco: Not on file     Comment: married since 1952 to wife Thelma Barge, retrired Journalist, newspaper  . Alcohol Use: No    Review of Systems  Constitutional: Negative for fever and chills.  Respiratory: Negative for cough, chest tightness and shortness of breath.   Cardiovascular: Negative for chest pain, palpitations and leg swelling.  Gastrointestinal: Negative for nausea, vomiting, abdominal pain, diarrhea and abdominal distention.  Genitourinary: Negative for dysuria, urgency, frequency and hematuria.  Musculoskeletal: Positive for arthralgias, joint swelling and myalgias. Negative for neck pain and neck stiffness.  Skin: Positive for wound. Negative for rash.  Allergic/Immunologic: Negative for immunocompromised state.  Neurological: Negative for dizziness, weakness, light-headedness, numbness and headaches.    Allergies  Asa buff (mag and Ibuprofen  Home Medications   Current Outpatient Rx  Name  Route  Sig  Dispense  Refill  . budesonide (PULMICORT) 0.25 MG/2ML nebulizer solution  0.25 mg as needed. Use two times a day         . clonazePAM (KLONOPIN) 0.5 MG tablet      TAKE 1 TABLET BY MOUTH AT BEDTIME AS NEEDED   30 tablet   0   . diltiazem (CARDIZEM CD) 360 MG 24 hr capsule   Oral   Take 1 capsule (360 mg total) by mouth daily.   90 capsule   3   . furosemide (LASIX) 40 MG tablet   Oral   Take 1 tablet (40 mg total) by mouth daily.   90 tablet   1   . glipiZIDE (GLUCOTROL) 5 MG tablet   Oral   Take 7.5 mg by mouth daily before breakfast.         .  HYDROcodone-acetaminophen (NORCO/VICODIN) 5-325 MG per tablet   Oral   Take 0.5-1 tablets by mouth every 8 (eight) hours as needed for pain. prn   30 tablet   5   . ipratropium (ATROVENT) 0.02 % nebulizer solution   Nebulization   Take 0.5 mg by nebulization 4 (four) times daily - after meals and at bedtime.         . lovastatin (MEVACOR) 40 MG tablet   Oral   Take 40 mg by mouth at bedtime.         . metoprolol succinate (TOPROL-XL) 25 MG 24 hr tablet   Oral   Take 1 tablet (25 mg total) by mouth daily.   90 tablet   2   . Multiple Vitamins-Iron (QC DAILY MULTIVITAMINS/IRON) TABS   Oral   Take 1 tablet by mouth daily.           . nitroGLYCERIN (NITROSTAT) 0.3 MG SL tablet   Sublingual   Place 1 tablet (0.3 mg total) under the tongue every 5 (five) minutes as needed.   25 tablet   3   . olopatadine (PATANOL) 0.1 % ophthalmic solution   Both Eyes   Place 1 drop into both eyes 2 (two) times daily.   5 mL   12   . PredniSONE 5 MG TBEC   Oral   Take 5 mg by mouth daily.   30 tablet   5     Will resume after he is tapered from current dose   . promethazine-codeine (PHENERGAN WITH CODEINE) 6.25-10 MG/5ML syrup      TAKE 1 TEASPOONFUL BY MOUTH 4 TIMES A DAY AS NEEDED FOR COUGH   200 mL   0   . Rivaroxaban (XARELTO) 15 MG TABS tablet   Oral   Take 1 tablet (15 mg total) by mouth daily with supper.   30 tablet   6    BP 126/87  Temp(Src) 97.9 F (36.6 C) (Oral)  Resp 26  Ht 5\' 8"  (1.727 m)  Wt 165 lb (74.844 kg)  BMI 25.09 kg/m2  SpO2 96% Physical Exam  Nursing note and vitals reviewed. Constitutional: He appears well-developed and well-nourished. No distress.  HENT:  Head: Normocephalic and atraumatic.  Eyes: Conjunctivae and EOM are normal. Pupils are equal, round, and reactive to light.  Neck: Normal range of motion. Neck supple.  Cardiovascular: Normal rate, regular rhythm and normal heart sounds.   Pulmonary/Chest: Effort normal. No  respiratory distress. He has no wheezes. He has no rales.  Abdominal: Soft. Bowel sounds are normal. He exhibits no distension. There is no tenderness. There is no rebound.  Musculoskeletal: He exhibits no edema.  Tender to palpation over right  hip and right knee. Pain with right hip ROM, flexion, extesion, internal and external rotation. No right knee tenderness. Pain with flexion and extension   Neurological: He is alert.  Skin: Skin is warm and dry.  Left elbow skin tear, 8cm, gaping, hemostatic    ED Course  Procedures (including critical care time) Labs Review Labs Reviewed  CBC WITH DIFFERENTIAL - Abnormal; Notable for the following:    WBC 12.7 (*)    RBC 4.21 (*)    Neutrophils Relative % 88 (*)    Neutro Abs 11.2 (*)    Lymphocytes Relative 6 (*)    All other components within normal limits  BASIC METABOLIC PANEL - Abnormal; Notable for the following:    Glucose, Bld 317 (*)    BUN 28 (*)    Creatinine, Ser 1.77 (*)    GFR calc non Af Amer 32 (*)    GFR calc Af Amer 37 (*)    All other components within normal limits  POCT I-STAT TROPONIN I   Imaging Review Dg Elbow Complete Left  04/22/2013   CLINICAL DATA:  Recent traumatic injury with pain  EXAM: LEFT ELBOW - COMPLETE 3+ VIEW  COMPARISON:  None.  FINDINGS: There is no evidence of fracture, dislocation, or joint effusion. There is no evidence of arthropathy or other focal bone abnormality. Soft tissues are unremarkable.  IMPRESSION: No acute abnormality is noted.   Electronically Signed   By: Alcide Clever M.D.   On: 04/22/2013 15:13   Dg Hip Complete Right  04/22/2013   CLINICAL DATA:  Recent fall with pain  EXAM: RIGHT HIP - COMPLETE 2+ VIEW  COMPARISON:  None.  FINDINGS: There are changes suggestive of an intratrochanteric fracture without displacement. Degenerative changes of the hip joint are noted. No gross soft tissue abnormality is seen. The pelvic ring is intact.  IMPRESSION: Findings suggestive of an undisplaced  intertrochanteric fracture in the proximal right femur.   Electronically Signed   By: Alcide Clever M.D.   On: 04/22/2013 15:09   Ct Head Wo Contrast  04/22/2013   CLINICAL DATA:  FALL.  RIGHT LEG PAIN.  THE PATIENT IS ON XARELTO.  EXAM: CT HEAD WITHOUT CONTRAST  CT CERVICAL SPINE WITHOUT CONTRAST  TECHNIQUE: Multidetector CT imaging of the head and cervical spine was performed following the standard protocol without intravenous contrast. Multiplanar CT image reconstructions of the cervical spine were also generated.  COMPARISON:  CT head without contrast 02/08/2013.  FINDINGS: CT HEAD FINDINGS  No acute cortical infarct, hemorrhage, or mass lesion is present. Moderate atrophy and diffuse white matter disease is similar to the prior study. The ventricles are proportionate to the degree of atrophy. No significant extra-axial fluid collection is present.  The patient is status post bilateral maxillary antrostomies and ethmoidectomies. A septal defect is present. Chronic bilateral frontal sinus disease persists. There is chronic opacification of residual scattered ethmoid air cells and circumferential mucosal thickening in the maxillary sinuses right greater than left. A large polyp is again noted at the right nasal choana.  CT CERVICAL SPINE FINDINGS  The cervical spine is imaged from the skull base through T3. There is ankylosis of anterior osteophytes across multiple levels. The vertebral body heights and alignment are maintained. No acute fracture or traumatic subluxation is evident. Uncovertebral spurring leads osseous foraminal narrowing bilaterally at C5-6.  Soft tissues demonstrate atherosclerotic calcifications at the carotid arteries bilaterally with potential stenosis on the left. The thyroid is heterogeneous with a foraminal  lobe extending inferiorly from the left. Additional atherosclerotic calcifications are noted at the aortic arch. The lung apices are clear.  IMPRESSION: 1. Stable atrophy and diffuse  white matter disease. 2. No acute intracranial abnormality. 3. Similar appearance of chronic residual paranasal sinus disease following nasal surgery. 4. Multilevel spondylosis of the cervical spine is most pronounced at C5-6 with mild osseous foraminal narrowing bilaterally. 5. No acute fracture or traumatic subluxation. 6. Diffuse atherosclerotic changes. 7. Thyroid goiter without a dominant lesion.   Electronically Signed   By: Gennette Pac M.D.   On: 04/22/2013 15:35   Ct Cervical Spine Wo Contrast  04/22/2013   CLINICAL DATA:  FALL.  RIGHT LEG PAIN.  THE PATIENT IS ON XARELTO.  EXAM: CT HEAD WITHOUT CONTRAST  CT CERVICAL SPINE WITHOUT CONTRAST  TECHNIQUE: Multidetector CT imaging of the head and cervical spine was performed following the standard protocol without intravenous contrast. Multiplanar CT image reconstructions of the cervical spine were also generated.  COMPARISON:  CT head without contrast 02/08/2013.  FINDINGS: CT HEAD FINDINGS  No acute cortical infarct, hemorrhage, or mass lesion is present. Moderate atrophy and diffuse white matter disease is similar to the prior study. The ventricles are proportionate to the degree of atrophy. No significant extra-axial fluid collection is present.  The patient is status post bilateral maxillary antrostomies and ethmoidectomies. A septal defect is present. Chronic bilateral frontal sinus disease persists. There is chronic opacification of residual scattered ethmoid air cells and circumferential mucosal thickening in the maxillary sinuses right greater than left. A large polyp is again noted at the right nasal choana.  CT CERVICAL SPINE FINDINGS  The cervical spine is imaged from the skull base through T3. There is ankylosis of anterior osteophytes across multiple levels. The vertebral body heights and alignment are maintained. No acute fracture or traumatic subluxation is evident. Uncovertebral spurring leads osseous foraminal narrowing bilaterally at  C5-6.  Soft tissues demonstrate atherosclerotic calcifications at the carotid arteries bilaterally with potential stenosis on the left. The thyroid is heterogeneous with a foraminal lobe extending inferiorly from the left. Additional atherosclerotic calcifications are noted at the aortic arch. The lung apices are clear.  IMPRESSION: 1. Stable atrophy and diffuse white matter disease. 2. No acute intracranial abnormality. 3. Similar appearance of chronic residual paranasal sinus disease following nasal surgery. 4. Multilevel spondylosis of the cervical spine is most pronounced at C5-6 with mild osseous foraminal narrowing bilaterally. 5. No acute fracture or traumatic subluxation. 6. Diffuse atherosclerotic changes. 7. Thyroid goiter without a dominant lesion.   Electronically Signed   By: Gennette Pac M.D.   On: 04/22/2013 15:35   Ct Hip Right Wo Contrast  04/22/2013   CLINICAL DATA:  Status post fall.  Right hip pain.  EXAM: CT OF THE RIGHT HIP WITHOUT CONTRAST  TECHNIQUE: Multidetector CT imaging was performed according to the standard protocol. Multiplanar CT image reconstructions were also generated.  COMPARISON:  Plain films the right hip 04/22/2013 and CT scan of the abdomen and pelvis 02/05/2013.  FINDINGS: No fracture is identified. The right hip is located. The patient has severe right hip osteoarthritis. There is also degenerative change about the symphysis pubis and right sacroiliac joint. There may be a small right hip joint effusion. Imaged intrapelvic contents demonstrate no acute abnormality. Atherosclerosis is noted.  IMPRESSION: Negative for fracture or other acute abnormality.  Severe right hip osteoarthritis.   Electronically Signed   By: Drusilla Kanner M.D.   On: 04/22/2013 16:47  Dg Knee Complete 4 Views Right  04/22/2013   CLINICAL DATA:  Recent trauma with pain  EXAM: RIGHT KNEE - COMPLETE 4+ VIEW  COMPARISON:  None.  FINDINGS: No acute fracture or dislocation is identified. Diffuse  vascular calcifications are seen. No acute abnormality noted.  IMPRESSION: No acute abnormality seen.   Electronically Signed   By: Alcide Clever M.D.   On: 04/22/2013 15:09    EKG Interpretation    Date/Time:  Tuesday April 22 2013 13:31:14 EST Ventricular Rate:  122 PR Interval:    QRS Duration: 124 QT Interval:  362 QTC Calculation: 516 R Axis:   -75 Text Interpretation:  Age not entered, assumed to be  79 years old for purpose of ECG interpretation Atrial flutter RBBB and LAFB Nonspecific ST and T wave abnormality Since last tracing rate faster Confirmed by KNAPP  MD-I, IVA ([id number]) on 04/22/2013 3:45:32 PM            MDM   1. Fall, initial encounter   2. Right knee sprain, initial encounter   3. Atrial fibrillation with RVR   4. Abrasion of left elbow, initial encounter     PT with mechanical fall at home 3 days ago. On Xarelto. CT head ordered. X-rays ordered. Pt is in A-fib with rvr. May not have taken his  cardizem today. Will try 30mg  PO.     Heart rate down to 90s after cardizem 30mg  PO. Pt has no CP, no SOB, no palpitations.     Possible fracture of right hip. CT obtained.     6:59 PM\ Spoke with Cardiologist regarding a-fib. Rate is now controlled at 80-90s. Recommended to make sure pt is taking cardizem and metoprolol, and make sure doses are accurate. i discussed this with pt and family. Will make sure he is taking meds.   Pt ambulated in hallway, no difficulty, with one nurse assist. Ready for discharge.    Filed Vitals:   04/22/13 1747 04/22/13 1830 04/22/13 1841 04/22/13 1930  BP: 128/112 127/69 127/69 146/85  Pulse:  89  91  Temp:      TempSrc:      Resp: 20 20 20 28   Height:      Weight:      SpO2: 95% 96% 95% 96%     Lottie Mussel, PA-C 04/22/13 1933

## 2013-04-26 NOTE — Assessment & Plan Note (Addendum)
Controlled but not clear. We reviewed medications Plan-stop codeine cough syrup

## 2013-04-26 NOTE — Assessment & Plan Note (Signed)
Concern about sedating this elderly man. Had this discussion with him and his wife again. Plan-reduce clonazepam to 1 tablet at night

## 2013-04-28 ENCOUNTER — Telehealth: Payer: Self-pay | Admitting: Internal Medicine

## 2013-04-28 NOTE — Telephone Encounter (Signed)
Pt's wife explains Xarelto recently started, Coumadin stopped. Three new patches of ecchymosis has appeared on patient's chest since yesterday she states. They are the size of a 50 cent piece. There are no other areas of bruising noted, no active bleeding from anywhere. Will review with Dr. Graciela Husbands and get back with patient. Pt's wife agreeable to plan.

## 2013-04-28 NOTE — Telephone Encounter (Signed)
New problem   Pt's wife need someone to call her concerning pt's bruising and bleeding. Please call

## 2013-04-29 NOTE — Telephone Encounter (Signed)
Advised pt wife to keep watching for other/more bruising that mysteriously shows up. Pt wife states bruising already reducing. She will watch for this and other signs of active bleeding, per our discussion, and will call with further concerns/issues.

## 2013-05-02 ENCOUNTER — Other Ambulatory Visit: Payer: Self-pay | Admitting: Internal Medicine

## 2013-05-02 NOTE — Telephone Encounter (Signed)
CY Please advise if okay to refill. Thanks.  

## 2013-05-04 NOTE — Telephone Encounter (Signed)
Ok to refill 

## 2013-05-21 ENCOUNTER — Ambulatory Visit (INDEPENDENT_AMBULATORY_CARE_PROVIDER_SITE_OTHER): Payer: Medicare Other | Admitting: Internal Medicine

## 2013-05-21 ENCOUNTER — Other Ambulatory Visit (INDEPENDENT_AMBULATORY_CARE_PROVIDER_SITE_OTHER): Payer: Medicare Other

## 2013-05-21 ENCOUNTER — Encounter: Payer: Self-pay | Admitting: Internal Medicine

## 2013-05-21 VITALS — BP 132/70 | HR 124 | Temp 97.9°F | Wt 155.6 lb

## 2013-05-21 DIAGNOSIS — W19XXXS Unspecified fall, sequela: Secondary | ICD-10-CM

## 2013-05-21 DIAGNOSIS — E1129 Type 2 diabetes mellitus with other diabetic kidney complication: Secondary | ICD-10-CM

## 2013-05-21 DIAGNOSIS — Y92009 Unspecified place in unspecified non-institutional (private) residence as the place of occurrence of the external cause: Secondary | ICD-10-CM

## 2013-05-21 DIAGNOSIS — N058 Unspecified nephritic syndrome with other morphologic changes: Secondary | ICD-10-CM

## 2013-05-21 DIAGNOSIS — M459 Ankylosing spondylitis of unspecified sites in spine: Secondary | ICD-10-CM

## 2013-05-21 DIAGNOSIS — I4891 Unspecified atrial fibrillation: Secondary | ICD-10-CM

## 2013-05-21 LAB — HEMOGLOBIN A1C: HEMOGLOBIN A1C: 8.6 % — AB (ref 4.6–6.5)

## 2013-05-21 MED ORDER — HYDROCODONE-ACETAMINOPHEN 5-325 MG PO TABS: 0.5000 | ORAL_TABLET | Freq: Three times a day (TID) | ORAL | Status: DC | PRN

## 2013-05-21 NOTE — Assessment & Plan Note (Signed)
Chronic inflammatory arthritis symptoms Uses hydrocodone for same, avoid anti-inflammatories due to renal insufficiency Refill provided today

## 2013-05-21 NOTE — Assessment & Plan Note (Signed)
Rate controlled on current meds Chronic anticoag reviewed - Changed from Coumadin to Xarelto fall 2014 Continue to monitor risk versus benefit, especially in setting of fall December 2014 Chronic easy bruising reviewed with wife, no abnormalities noted followup EP/cards as planned, call sooner if symptomatic

## 2013-05-21 NOTE — Progress Notes (Signed)
  Subjective:    Patient ID: [name], male    DOB: [birth year], 80 y.o.   MRN: [id number]  HPI  Here for ER follow up - see 04/22/13 following fall with R knee pain and L elbow laceration -   Also reviewed interval medical events and chronic medical issues  Past Medical History  Diagnosis Date  . Presence of permanent cardiac pacemaker 04/2006 upgrade    a. s/p MDT Adapta ADDR01 dual chamber PPM, ser #: [id number] H.  [name] PAF (paroxysmal atrial fibrillation)     a. previously on coumadin-->patient self-d/c'd 11/2012 b/c he was tired of having INR's checked.  . Tachy-brady syndrome   . DM (diabetes mellitus)   . CAD (coronary artery disease)     DES to cx  . Pulmonary embolism 12/2003  . Deep vein thrombophlebitis of leg 2005, 2009    recurrent when off anticoag  . HTN (hypertension)   . Allergic rhinitis   . Asthma   . CKD (chronic kidney disease), stage III   . Nasal polyp     Review of Systems  Constitutional: Positive for fatigue. Negative for fever.  Respiratory: Negative for cough and shortness of breath.   Musculoskeletal: Positive for arthralgias (chronic) and back pain (chronic). Negative for joint swelling and myalgias.  Neurological: Negative for dizziness, facial asymmetry, weakness, light-headedness, numbness and headaches.       Objective:   Physical Exam BP 132/70  Pulse 124  Temp(Src) 97.9 F (36.6 C) (Oral)  Wt 155 lb 9.6 oz (70.58 kg)  SpO2 96% Wt Readings from Last 3 Encounters:  05/21/13 155 lb 9.6 oz (70.58 kg)  04/22/13 165 lb (74.844 kg)  04/08/13 165 lb (74.844 kg)    Gen: NAD, nontoxic - wife and son at side Lung CTAB CV: irreg irreg, 3/6 murmur  Lab Results  Component Value Date   WBC 12.7* 04/22/2013   HGB 13.5 04/22/2013   HCT 40.4 04/22/2013   PLT 219 04/22/2013   GLUCOSE 317* 04/22/2013   CHOL 173 02/05/2013   TRIG 155* 02/05/2013   HDL 44 02/05/2013   LDLCALC 98 02/05/2013   ALT 9 02/05/2013   AST 15 02/05/2013   NA 137 04/22/2013    K 5.1 04/22/2013   CL 97 04/22/2013   CREATININE 1.77* 04/22/2013   BUN 28* 04/22/2013   CO2 27 04/22/2013   TSH 2.194 02/05/2013   INR 1.7* 03/12/2013   HGBA1C 8.9* 02/05/2013       Assessment & Plan:   Fall with L elbow skin laceration and R knee injury - both resolved Reviewed fall precautions, reviewed risk, esp on anticoag and adv age - will continue to work with cards on same  Also See problem list. Medications and labs reviewed today.

## 2013-05-21 NOTE — Assessment & Plan Note (Signed)
Long standing dx with neurologic complication (peripheral neuropathy) - follows with podiatry for same Also renal insuff Denies symptomatic hypoglycemia - does not check home CBGs On oral hypoglycemic agent solo therapy Check A1c every 6 months and as needed  Lab Results  Component Value Date   HGBA1C 8.9* 02/05/2013

## 2013-05-21 NOTE — Patient Instructions (Addendum)
It was good to see you today.  We have reviewed your prior records including labs and tests today  Test(s) ordered today. Your results will be released to [facility] (or called to you) after review, usually within 72hours after test completion. If any changes need to be made, you will be notified at that same time.  Medications reviewed and updated, no changes recommended at this time. Refill on medication(s) as discussed today.  If continued falls or bleeding problems, will consider discussing with Dr. Caryl Comes the need to discontinue your Xarelto  Followup here in 4-6 months for diabetes check, call sooner if problem  Fall Prevention and Home Safety Falls cause injuries and can affect all age groups. It is possible to use preventive measures to significantly decrease the likelihood of falls. There are many simple measures which can make your home safer and prevent falls. OUTDOORS  Repair cracks and edges of walkways and driveways.  Remove high doorway thresholds.  Trim shrubbery on the main path into your home.  Have good outside lighting.  Clear walkways of tools, rocks, debris, and clutter.  Check that handrails are not broken and are securely fastened. Both sides of steps should have handrails.  Have leaves, snow, and ice cleared regularly.  Use sand or salt on walkways during winter months.  In the garage, clean up grease or oil spills. BATHROOM  Install night lights.  Install grab bars by the toilet and in the tub and shower.  Use non-skid mats or decals in the tub or shower.  Place a plastic non-slip stool in the shower to sit on, if needed.  Keep floors dry and clean up all water on the floor immediately.  Remove soap buildup in the tub or shower on a regular basis.  Secure bath mats with non-slip, double-sided rug tape.  Remove throw rugs and tripping hazards from the floors. BEDROOMS  Install night lights.  Make sure a bedside light is easy to reach.  Do  not use oversized bedding.  Keep a telephone by your bedside.  Have a firm chair with side arms to use for getting dressed.  Remove throw rugs and tripping hazards from the floor. KITCHEN  Keep handles on pots and pans turned toward the center of the stove. Use back burners when possible.  Clean up spills quickly and allow time for drying.  Avoid walking on wet floors.  Avoid hot utensils and knives.  Position shelves so they are not too high or low.  Place commonly used objects within easy reach.  If necessary, use a sturdy step stool with a grab bar when reaching.  Keep electrical cables out of the way.  Do not use floor polish or wax that makes floors slippery. If you must use wax, use non-skid floor wax.  Remove throw rugs and tripping hazards from the floor. STAIRWAYS  Never leave objects on stairs.  Place handrails on both sides of stairways and use them. Fix any loose handrails. Make sure handrails on both sides of the stairways are as long as the stairs.  Check carpeting to make sure it is firmly attached along stairs. Make repairs to worn or loose carpet promptly.  Avoid placing throw rugs at the top or bottom of stairways, or properly secure the rug with carpet tape to prevent slippage. Get rid of throw rugs, if possible.  Have an electrician put in a light switch at the top and bottom of the stairs. OTHER FALL PREVENTION TIPS  Wear low-heel or  rubber-soled shoes that are supportive and fit well. Wear closed toe shoes.  When using a stepladder, make sure it is fully opened and both spreaders are firmly locked. Do not climb a closed stepladder.  Add color or contrast paint or tape to grab bars and handrails in your home. Place contrasting color strips on first and last steps.  Learn and use mobility aids as needed. Install an electrical emergency response system.  Turn on lights to avoid dark areas. Replace light bulbs that burn out immediately. Get light  switches that glow.  Arrange furniture to create clear pathways. Keep furniture in the same place.  Firmly attach carpet with non-skid or double-sided tape.  Eliminate uneven floor surfaces.  Select a carpet pattern that does not visually hide the edge of steps.  Be aware of all pets. OTHER HOME SAFETY TIPS  Set the water temperature for 120 F (48.8 C).  Keep emergency numbers on or near the telephone.  Keep smoke detectors on every level of the home and near sleeping areas. Document Released: 04/21/2002 Document Revised: 10/31/2011 Document Reviewed: 07/21/2011 [facility] Patient Information 2014 [place].

## 2013-05-21 NOTE — Progress Notes (Signed)
Pre-visit discussion using our clinic review tool. No additional management support is needed unless otherwise documented below in the visit note.  

## 2013-05-22 ENCOUNTER — Other Ambulatory Visit: Payer: Self-pay | Admitting: *Deleted

## 2013-05-22 ENCOUNTER — Telehealth: Payer: Self-pay

## 2013-05-22 MED ORDER — TRUETRACK BLOOD GLUCOSE W/DEVICE KIT: PACK | Status: DC

## 2013-05-22 MED ORDER — GLUCOSE BLOOD VI STRP: ORAL_STRIP | Status: DC

## 2013-05-22 MED ORDER — TRUEPLUS LANCETS 28G MISC: Status: DC

## 2013-05-22 MED ORDER — GLIPIZIDE 5 MG PO TABS: 7.5000 mg | ORAL_TABLET | Freq: Every day | ORAL | Status: DC

## 2013-05-22 NOTE — Telephone Encounter (Signed)
Sent [employer] kit and supplies...[name]

## 2013-05-22 NOTE — Telephone Encounter (Signed)
Family phoned, stating patient needs all the blood glucose monitoring supplies.

## 2013-05-22 NOTE — Telephone Encounter (Signed)
The patient's wife is hoping to speak to the triage RN as soon as possible about refilling the patient's diabetic testing supplies.

## 2013-05-22 NOTE — Telephone Encounter (Signed)
Notified pt spoke with wife & son concerning labs results wife states pt will need refills on his glipizide...[name]

## 2013-05-23 ENCOUNTER — Telehealth: Payer: Self-pay | Admitting: *Deleted

## 2013-05-23 MED ORDER — ONETOUCH ULTRA 2 W/DEVICE KIT: PACK | Status: AC

## 2013-05-23 MED ORDER — GLUCOSE BLOOD VI STRP: ORAL_STRIP | Status: AC

## 2013-05-23 MED ORDER — ONETOUCH LANCETS MISC: Status: DC

## 2013-05-23 NOTE — Telephone Encounter (Signed)
Left msg on vm stating received scripts for diabetic monitor & supplies. Pt has medicare will only cover onetouch or accu chek. Pls send new script. Generated new scripts for one touch monitor & supplies. [facility] because insurance will need md signature on script...[name]

## 2013-06-03 ENCOUNTER — Other Ambulatory Visit: Payer: Self-pay | Admitting: Internal Medicine

## 2013-06-03 ENCOUNTER — Telehealth: Payer: Self-pay | Admitting: Internal Medicine

## 2013-06-03 ENCOUNTER — Other Ambulatory Visit: Payer: Self-pay | Admitting: *Deleted

## 2013-06-03 MED ORDER — LOVASTATIN 40 MG PO TABS: 40.0000 mg | ORAL_TABLET | Freq: Every day | ORAL | Status: DC

## 2013-06-03 NOTE — Telephone Encounter (Signed)
Please advise if okay to refill. Thanks.  

## 2013-06-03 NOTE — Telephone Encounter (Signed)
Ok to refill 

## 2013-06-03 NOTE — Telephone Encounter (Signed)
I called and spoke with spouse. I advised her Dr. Caryl Comes is the one refilling this medications and that is who she needs to call. Nothing further needed

## 2013-06-05 ENCOUNTER — Other Ambulatory Visit: Payer: Self-pay | Admitting: Internal Medicine

## 2013-06-06 NOTE — Telephone Encounter (Signed)
Pt last had refilled 05/02/13 #200 ml x0 refills Last OV 04/08/13 Please advise if okay to refill thanks

## 2013-06-07 NOTE — Telephone Encounter (Signed)
Ok to refill 

## 2013-06-09 NOTE — Telephone Encounter (Signed)
Rx has been called in  

## 2013-06-10 NOTE — Telephone Encounter (Signed)
Called refill to pharmacy voicemail.  

## 2013-06-10 NOTE — Telephone Encounter (Signed)
done

## 2013-06-13 ENCOUNTER — Ambulatory Visit (INDEPENDENT_AMBULATORY_CARE_PROVIDER_SITE_OTHER): Payer: Medicare Other | Admitting: *Deleted

## 2013-06-13 ENCOUNTER — Encounter: Payer: Self-pay | Admitting: Internal Medicine

## 2013-06-13 DIAGNOSIS — I4891 Unspecified atrial fibrillation: Secondary | ICD-10-CM

## 2013-06-13 DIAGNOSIS — I495 Sick sinus syndrome: Secondary | ICD-10-CM

## 2013-06-22 LAB — MDC_IDC_ENUM_SESS_TYPE_REMOTE
Battery Impedance: 1841 Ohm
Battery Remaining Longevity: 28 mo
Battery Voltage: 2.74 V
Brady Statistic AP VS Percent: 26 %
Brady Statistic AS VS Percent: 73 %
Date Time Interrogation Session: 20150130211225
Lead Channel Impedance Value: 508 Ohm
Lead Channel Pacing Threshold Amplitude: 0.5 V
Lead Channel Pacing Threshold Amplitude: 1.125 V
Lead Channel Pacing Threshold Pulse Width: 0.4 ms
Lead Channel Pacing Threshold Pulse Width: 0.4 ms
Lead Channel Setting Pacing Amplitude: 2.5 V
Lead Channel Setting Pacing Pulse Width: 0.46 ms
MDC IDC MSMT LEADCHNL RA SENSING INTR AMPL: 2.8 mV
MDC IDC MSMT LEADCHNL RV IMPEDANCE VALUE: 515 Ohm
MDC IDC MSMT LEADCHNL RV SENSING INTR AMPL: 16 mV
MDC IDC SET LEADCHNL RA PACING AMPLITUDE: 2.25 V
MDC IDC SET LEADCHNL RV SENSING SENSITIVITY: 4 mV
MDC IDC STAT BRADY AP VP PERCENT: 1 %
MDC IDC STAT BRADY AS VP PERCENT: 0 %

## 2013-06-30 ENCOUNTER — Other Ambulatory Visit: Payer: Self-pay | Admitting: Internal Medicine

## 2013-07-09 ENCOUNTER — Ambulatory Visit: Payer: Medicare Other | Admitting: Internal Medicine

## 2013-07-09 ENCOUNTER — Encounter: Payer: Self-pay | Admitting: *Deleted

## 2013-07-11 ENCOUNTER — Other Ambulatory Visit: Payer: Self-pay | Admitting: Internal Medicine

## 2013-07-11 NOTE — Telephone Encounter (Signed)
Ok to refill 

## 2013-07-11 NOTE — Telephone Encounter (Signed)
Please advise if okay to refill. Thanks.  

## 2013-07-12 ENCOUNTER — Other Ambulatory Visit: Payer: Self-pay | Admitting: Internal Medicine

## 2013-07-14 NOTE — Telephone Encounter (Signed)
PT CALLING THE SECOND TIME A/B REFILLS FOR HER HUSBAND HE OUT PLS CALL ASAP.[name]

## 2013-07-14 NOTE — Telephone Encounter (Signed)
Rx's have been called in per CY. Pt's wife is aware. Nothing further was needed.

## 2013-07-21 ENCOUNTER — Ambulatory Visit: Payer: Medicare Other | Admitting: Internal Medicine

## 2013-07-25 ENCOUNTER — Other Ambulatory Visit: Payer: Self-pay | Admitting: Internal Medicine

## 2013-08-19 ENCOUNTER — Other Ambulatory Visit: Payer: Self-pay | Admitting: Internal Medicine

## 2013-08-20 NOTE — Telephone Encounter (Signed)
CY, Please advise if okay to refill; patient last seen 03-2013 and told to follow up 06-2013-OV was scheduled for 07-2013 and patient cancelled appt. Thanks.

## 2013-08-20 NOTE — Telephone Encounter (Signed)
Ok to refill with reminder to wife that this may cause over-sedation and risk of falls.

## 2013-08-22 ENCOUNTER — Telehealth: Payer: Self-pay | Admitting: Internal Medicine

## 2013-08-22 MED ORDER — CLONAZEPAM 0.5 MG PO TABS: ORAL_TABLET | ORAL | Status: DC

## 2013-08-22 NOTE — Telephone Encounter (Signed)
Spoke with pt's wife.  Advised that rx for Clonzaepam was called to pharmacy per Dr Annamaria Boots.  Appt scheduled with Dr young

## 2013-08-22 NOTE — Telephone Encounter (Signed)
Last OV 04-08-13, rec to follow-up in Feb 2015, but no OV set.  Last refill for clonazepam was given on 07/14/13 for # 30. Please advise if ok to refill. [place] Bing, CMA Allergies  Allergen Reactions  . Asa Buff (Mag [Buffered Aspirin] Nausea Only  . Ibuprofen     REACTION: causes nausea and vomitting

## 2013-08-22 NOTE — Telephone Encounter (Signed)
Ok to refill clonazepam. Please set routine f/u ov sometime in next month or so

## 2013-08-22 NOTE — Telephone Encounter (Signed)
Called refill to pharmacy voicemail.  Spoke with wife about may cause over sedation and risk of falls.

## 2013-08-25 ENCOUNTER — Telehealth: Payer: Self-pay | Admitting: Internal Medicine

## 2013-08-25 MED ORDER — AZITHROMYCIN 250 MG PO TABS: ORAL_TABLET | ORAL | Status: DC

## 2013-08-25 NOTE — Telephone Encounter (Signed)
Spouse aware of recs. RX called in. Nothing further needed

## 2013-08-25 NOTE — Telephone Encounter (Signed)
Spoke with spouse. She reports pt is coughing a lot-gets up white foamy phlem, slight wheezing, feels bad in general. She reports Dr. Annamaria Boots always calls in ABX when he gets like this. Please advise thanks  Allergies  Allergen Reactions  . Asa Buff (Mag [Buffered Aspirin] Nausea Only  . Ibuprofen     REACTION: causes nausea and vomitting     Current Outpatient Prescriptions on File Prior to Visit  Medication Sig Dispense Refill  . Blood Glucose Monitoring Suppl (ONE TOUCH ULTRA 2) W/DEVICE KIT Use to check blood sugars daily Dx 250.00  1 each  0  . budesonide (PULMICORT) 0.25 MG/2ML nebulizer solution Take 0.25 mg by nebulization every 6 (six) hours as needed (shortness of breath). Use two times a day      . clonazePAM (KLONOPIN) 0.5 MG tablet TAKE 1 TABLET BY MOUTH AT BEDTIME AS NEEDED  30 tablet  0  . clonazePAM (KLONOPIN) 0.5 MG tablet TAKE 1 TABLET BY MOUTH AT BEDTIME AS NEEDED  30 tablet  2  . diltiazem (CARDIZEM CD) 360 MG 24 hr capsule Take 1 capsule (360 mg total) by mouth daily.  90 capsule  3  . furosemide (LASIX) 40 MG tablet Take 1 tablet (40 mg total) by mouth daily.  90 tablet  1  . glipiZIDE (GLUCOTROL) 5 MG tablet Take 1.5 tablets (7.5 mg total) by mouth daily before breakfast.  45 tablet  11  . glipiZIDE (GLUCOTROL) 5 MG tablet TAKE 1 AND 1/2 TABLET BY MOUTH EVERY DAY BEFORE BREAKFAST  90 tablet  3  . glucose blood (ONE TOUCH ULTRA TEST) test strip Use to check blood sugars before breakfast Dx 250.00  30 each  11  . HYDROcodone-acetaminophen (NORCO/VICODIN) 5-325 MG per tablet Take 0.5-1 tablets by mouth every 8 (eight) hours as needed. prn  40 tablet  0  . ipratropium (ATROVENT) 0.02 % nebulizer solution Take 0.5 mg by nebulization 4 (four) times daily - after meals and at bedtime.      . lovastatin (MEVACOR) 40 MG tablet Take 1 tablet (40 mg total) by mouth at bedtime.  90 tablet  0  . metoprolol succinate (TOPROL-XL) 25 MG 24 hr tablet Take 1 tablet (25 mg total) by mouth  daily.  90 tablet  2  . Multiple Vitamins-Iron (QC DAILY MULTIVITAMINS/IRON) TABS Take 1 tablet by mouth daily.        . nitroGLYCERIN (NITROSTAT) 0.3 MG SL tablet Place 0.3 mg under the tongue every 5 (five) minutes as needed for chest pain.      [name] olopatadine (PATANOL) 0.1 % ophthalmic solution Place 1 drop into both eyes 2 (two) times daily.  5 mL  12  . ONE TOUCH LANCETS MISC Use to help check blood sugars before breakfast Dx 250.00  30 each  11  . PredniSONE 5 MG TBEC Take 5 mg by mouth daily.  30 tablet  5  . promethazine-codeine (PHENERGAN WITH CODEINE) 6.25-10 MG/5ML syrup TAKE 1 TEASPOONFUL BY MOUTH 4 TIMES A DAY  200 mL  0  . Rivaroxaban (XARELTO) 15 MG TABS tablet Take 1 tablet (15 mg total) by mouth daily with supper.  30 tablet  6   No current facility-administered medications on file prior to visit.

## 2013-08-25 NOTE — Telephone Encounter (Signed)
Offer Zpak 

## 2013-09-09 ENCOUNTER — Other Ambulatory Visit: Payer: Self-pay | Admitting: Internal Medicine

## 2013-09-10 NOTE — Telephone Encounter (Signed)
CY-please advise if okay to refill. Thanks.  

## 2013-09-10 NOTE — Telephone Encounter (Signed)
Ok to refill 

## 2013-09-12 ENCOUNTER — Telehealth: Payer: Self-pay | Admitting: Internal Medicine

## 2013-09-12 MED ORDER — PROMETHAZINE-CODEINE 6.25-10 MG/5ML PO SYRP: ORAL_SOLUTION | ORAL | Status: DC

## 2013-09-12 NOTE — Telephone Encounter (Signed)
I think this was ok'd yesterday- ok to refill but don't duplicate

## 2013-09-12 NOTE — Telephone Encounter (Signed)
Called and spoke with pts wife and she stated that the pt is asking for a refill of the promethazine with codeine cough meds.   Last filled 07/14/2013.  CY please advise if ok to call in refill.  Thanks  Last ov--04/08/2013 Next ov--09/25/2013  Allergies  Allergen Reactions  . Asa Buff (Mag [Buffered Aspirin] Nausea Only  . Ibuprofen     REACTION: causes nausea and vomitting     Current Outpatient Prescriptions on File Prior to Visit  Medication Sig Dispense Refill  . azithromycin (ZITHROMAX) 250 MG tablet Take as directed  6 tablet  0  . Blood Glucose Monitoring Suppl (ONE TOUCH ULTRA 2) W/DEVICE KIT Use to check blood sugars daily Dx 250.00  1 each  0  . budesonide (PULMICORT) 0.25 MG/2ML nebulizer solution Take 0.25 mg by nebulization every 6 (six) hours as needed (shortness of breath). Use two times a day      . clonazePAM (KLONOPIN) 0.5 MG tablet TAKE 1 TABLET BY MOUTH AT BEDTIME AS NEEDED  30 tablet  0  . clonazePAM (KLONOPIN) 0.5 MG tablet TAKE 1 TABLET BY MOUTH AT BEDTIME AS NEEDED  30 tablet  2  . diltiazem (CARDIZEM CD) 360 MG 24 hr capsule Take 1 capsule (360 mg total) by mouth daily.  90 capsule  3  . furosemide (LASIX) 40 MG tablet Take 1 tablet (40 mg total) by mouth daily.  90 tablet  1  . glipiZIDE (GLUCOTROL) 5 MG tablet Take 1.5 tablets (7.5 mg total) by mouth daily before breakfast.  45 tablet  11  . glipiZIDE (GLUCOTROL) 5 MG tablet TAKE 1 AND 1/2 TABLET BY MOUTH EVERY DAY BEFORE BREAKFAST  90 tablet  3  . glucose blood (ONE TOUCH ULTRA TEST) test strip Use to check blood sugars before breakfast Dx 250.00  30 each  11  . HYDROcodone-acetaminophen (NORCO/VICODIN) 5-325 MG per tablet Take 0.5-1 tablets by mouth every 8 (eight) hours as needed. prn  40 tablet  0  . ipratropium (ATROVENT) 0.02 % nebulizer solution Take 0.5 mg by nebulization 4 (four) times daily - after meals and at bedtime.      . lovastatin (MEVACOR) 40 MG tablet Take 1 tablet (40 mg total) by mouth at  bedtime.  90 tablet  0  . metoprolol succinate (TOPROL-XL) 25 MG 24 hr tablet Take 1 tablet (25 mg total) by mouth daily.  90 tablet  2  . Multiple Vitamins-Iron (QC DAILY MULTIVITAMINS/IRON) TABS Take 1 tablet by mouth daily.        . nitroGLYCERIN (NITROSTAT) 0.3 MG SL tablet Place 0.3 mg under the tongue every 5 (five) minutes as needed for chest pain.      [name] olopatadine (PATANOL) 0.1 % ophthalmic solution Place 1 drop into both eyes 2 (two) times daily.  5 mL  12  . ONE TOUCH LANCETS MISC Use to help check blood sugars before breakfast Dx 250.00  30 each  11  . PredniSONE 5 MG TBEC Take 5 mg by mouth daily.  30 tablet  5  . promethazine-codeine (PHENERGAN WITH CODEINE) 6.25-10 MG/5ML syrup TAKE 1 TEASPOONFUL BY MOUTH 4 TIMES A DAY  200 mL  0  . Rivaroxaban (XARELTO) 15 MG TABS tablet Take 1 tablet (15 mg total) by mouth daily with supper.  30 tablet  6   No current facility-administered medications on file prior to visit.

## 2013-09-12 NOTE — Telephone Encounter (Signed)
[facility] and [facility] have not received any order on this for pt. I spoke with Shirlean Mylar and gave VO.  Spouse aware this has been called in. Nothing further needed

## 2013-09-16 ENCOUNTER — Ambulatory Visit (INDEPENDENT_AMBULATORY_CARE_PROVIDER_SITE_OTHER): Payer: Medicare Other | Admitting: *Deleted

## 2013-09-16 DIAGNOSIS — I4891 Unspecified atrial fibrillation: Secondary | ICD-10-CM

## 2013-09-16 DIAGNOSIS — I495 Sick sinus syndrome: Secondary | ICD-10-CM

## 2013-09-16 LAB — MDC_IDC_ENUM_SESS_TYPE_REMOTE
Battery Remaining Longevity: 26 mo
Battery Voltage: 2.72 V
Brady Statistic AP VP Percent: 1 %
Brady Statistic AP VS Percent: 29 %
Brady Statistic AS VP Percent: 0 %
Brady Statistic AS VS Percent: 70 %
Date Time Interrogation Session: 20150505131257
Lead Channel Impedance Value: 450 Ohm
Lead Channel Pacing Threshold Amplitude: 0.625 V
Lead Channel Pacing Threshold Pulse Width: 0.4 ms
Lead Channel Sensing Intrinsic Amplitude: 0.7 mV
Lead Channel Sensing Intrinsic Amplitude: 8 mV
Lead Channel Setting Pacing Amplitude: 2.5 V
MDC IDC MSMT BATTERY IMPEDANCE: 1972 Ohm
MDC IDC MSMT LEADCHNL RA IMPEDANCE VALUE: 461 Ohm
MDC IDC SET LEADCHNL RV PACING AMPLITUDE: 2.5 V
MDC IDC SET LEADCHNL RV PACING PULSEWIDTH: 0.4 ms
MDC IDC SET LEADCHNL RV SENSING SENSITIVITY: 4 mV

## 2013-09-18 ENCOUNTER — Ambulatory Visit: Payer: Medicare Other | Admitting: Internal Medicine

## 2013-09-25 ENCOUNTER — Encounter: Payer: Self-pay | Admitting: Internal Medicine

## 2013-09-25 ENCOUNTER — Ambulatory Visit (INDEPENDENT_AMBULATORY_CARE_PROVIDER_SITE_OTHER): Payer: Medicare Other | Admitting: Internal Medicine

## 2013-09-25 VITALS — BP 120/72 | HR 77 | Ht 67.0 in | Wt 165.0 lb

## 2013-09-25 DIAGNOSIS — J45909 Unspecified asthma, uncomplicated: Secondary | ICD-10-CM

## 2013-09-25 DIAGNOSIS — G4700 Insomnia, unspecified: Secondary | ICD-10-CM

## 2013-09-25 DIAGNOSIS — J4540 Moderate persistent asthma, uncomplicated: Secondary | ICD-10-CM

## 2013-09-25 DIAGNOSIS — J330 Polyp of nasal cavity: Secondary | ICD-10-CM

## 2013-09-25 DIAGNOSIS — I2510 Atherosclerotic heart disease of native coronary artery without angina pectoris: Secondary | ICD-10-CM

## 2013-09-25 NOTE — Progress Notes (Signed)
Patient ID: [name], male    DOB: 03/11/1921, 80 y.o.   MRN: [id number]  HPI 02/03/11- 89-year-old male never smoker followed for asthma, rhinitis, complicated by AF/pacemaker,/Coumadin, history PE/DVT, DM, HBP, CAD, renal insufficiency...............[name] wife here Last here- October 21, 2009-note reviewed Chest x-ray 10/02/2009 had shown stable basilar scarring otherwise clear lungs with no evidence of fibrosis from his previous amiodarone. He says he feels well, needing to use his nebulizer machine at least once daily through the spring and now in the fall weather. With recent cool weather his ears feel stopped up, decreased hearing, blowing some yellow from nose and chest, throat feels congested. He does not feel tight or wheezy, or short of breath. Appetite is down. He denies fever, blood, swollen glands were swollen feet.  04/07/11-  80 year old male never smoker followed for asthma, rhinitis, complicated by AF/pacemaker,/Coumadin, history PE/DVT, DM, HBP, CAD, renal insufficiency...............[name] wife here He has had flu shot. He says his breathing and allergy problems are "fine", under good control. He complains of his chronic insomnia. He is going to bed at 9:00 but rarely sleepy before 11. His wife gets him up at 6:45 AM. He tried Silenor but it caused headache. Tried cough syrup for sedation but that did not help. Says clonazepam 0.5 mg at bedtime does not help his sleep initiation. This is his chronic pattern, without observation of movement or respiratory disturbance. He does not feel physically uncomfortable in bed.  05/03/2011 Acute OV  Complains of  wheezing,sob,productive cough(yellow and thick). Called in [place] 2 days ago.  No fever or body aches. Coughing is getting worse. Wheezing is worse in early am.  No hemoptysis or chest pain . No edema . Appetite is good.  Feels some better since starting Zpack.   08/10/11-  80 year old male never smoker followed for asthma, rhinitis,  complicated by AF/pacemaker,/Coumadin, history PE/DVT, DM, HBP, CAD, renal insufficiency...............[name] wife here Chest congestion,cough-productive-yellow in color for weeks now-starts and stops; slight wheeze. They say the pattern is not new or changed. Chronic variable productive cough may improve for a few days after antibitotics. No blood and not progressive. Using only a nebulizer machine- dropped off of inhalers. [place]at rhythm well controlled on chronic amiodarone w/ pacemaker.   10/11/11- 80 year old male never smoker followed for asthma, rhinitis, complicated by AF/pacemaker,/Coumadin, history PE/DVT, DM, HBP, CAD, renal insufficiency...............[name] wife here She says he has not changed much in the last 2 months. Productive cough with yellow and white "bubbles" but no blood or chest pain. Denies fever or swelling. Nebulizer helps a little with Atrovent used 4 times daily. Advair did not help. He is off of chronic prednisone.  12/11/11 -80 year old male never smoker followed for asthma, rhinitis, complicated by AF/pacemaker,/Coumadin, history PE/DVT, DM, HBP, CAD, renal insufficiency...............[name] wife here Wife thinks his breathing is doing well. Very limited activity due to leg weakness. Gives out quickly with breathing and not sleeping (not due to lung condition) Has no primary care since doctor moved. Dropped off prednisone which did help, but dropped off Advair which did not help. Uses nebulizer 3 times per day.  CXR 6/4 /13 IMPRESSION: Mild chronic changes at the lung bases. Ankylosing  spondylitis.  Original Report Authenticated By: Larey Seat, M.D.   02/12/12- 80 year old male never smoker followed for asthma, rhinitis, complicated by AF/pacemaker,/Coumadin, history PE/DVT, DM, HBP, CAD, renal insufficiency...............[name] wife here Cough-productive-yellow in color(happens all the time) For the past 2 weeks he reports increased nasal congestion. Using saline nasal rinse. Cough  chronically productive of light yellow sputum. No blood, fever or chest pain. Remote history of nasal polyps. He continues prednisone 10 mg daily. COPD assessment test (CABG) 32/40  07/02/12- 80 year old male never smoker followed for asthma, rhinitis, complicated by AF/pacemaker,/Coumadin, history PE/DVT, DM, HBP, CAD, renal insufficiency...............[name] wife here FOLLOWS FOR: increased chest congestion-yellow in color and lots of wheezing per wife, who watches over him pretty well. Primary care gave prednisone for swelling in his hand 2 weeks ago. It did not seem to help his breathing. No acute event. Sputum yellow or white with no blood or at chronic productive cough "no energy". He stopped Advair, preferring his nebulizer machine. He asks refill lorazepam for sleep. We discussed this carefully.  09/05/12- 80 year old male never smoker followed for asthma, rhinitis, complicated by AF/pacemaker,/Coumadin, history PE/DVT, DM, HBP, CAD, renal insufficiency        wife here FOLLOWS FOR: denies any wheezing or SOB. Slight cough but saw PCP and was given RX-stopped cough. No allergy troubles at this time. Dr. Asa Lente gave prednisone for arthralgias, 5 mg daily. It has helped his mild chronic cough. Wife states he doesn't "spit" as much. He likes cough syrup and Ativan used for sleep. I discussed these sedating medications and his age. Carefully with them. They both say he does not get confused, over sleepy or unsteady from using these. Bedtime around 8 or 9 PM, up at 6 AM.  01/21/13- 80 year old male never smoker followed for asthma, rhinitis, Insomnia, complicated by AF/pacemaker,/Coumadin, history PE/DVT, DM, HBP, CAD, renal insufficiency        wife here FOLLOWS FOR: Symptoms unchanged since last OV. He complains of feeling unusually weak today, nothing specific. They both say he has chronic cough with yellow sputum that comes right back after antibiotic. Remains on prednisone 5 mg daily and using  nebulizer directed. No change in his chronic insomnia pattern. CXR 09/05/12 IMPRESSION:  Minimal bibasilar atelectasis.  Borderline enlargement of cardiac silhouette post pacemaker.  Original Report Authenticated By: Lavonia Dana, M.D.  02/21/2013 Acute OV  Complains that he had increased SOB, prod cough with occasional white mucus x1day . Had episode of very thick mucus , was able to get up after neb and breathing improved. Feels much better today, back to his baseline.  Was recently admitted for Asthma /COPD flare 3 weeks ago, tx w/ abx and steroids. Did have Atrial fib , and was  recommended to change Albuterol to Xopenex however insurance will not cover.  Denies chest tightness, f/c/s, hemoptysis, nausea, vomiting, edema.   Daughter and wife are with him today.   04/08/13-80 year old male never smoker followed for asthma, rhinitis, Insomnia, complicated by AF/pacemaker,/Coumadin, history PE/DVT, DM, HBP, CAD, renal insufficiency        wife here Wife notices he gets confused some and I pushed the issue of his sedating medications. He lies awake at night but sleepy in the daytime. We discussed sleep schedule and sleep habit. After his pacemaker was adjusted he stopped "spitting" and has no chest rattle, less cough. CXR 02/05/13 IMPRESSION:  Mild opacity left lung base most compatible with atelectasis.  Electronically Signed  By: Inge Rise M.D.  On: 02/05/2013 06:30   09/25/13- 80 year old male never smoker followed for asthma, rhinitis, Insomnia, complicated by AF/pacemaker,/Coumadin, history PE/DVT, DM, HBP, CAD, renal insufficiency        wife here FOLLOWS FOR: denies any cough, wheezing, or SOB. Pt states that he spits alot-white in color mostly with bubbles but has had yellow at  times. No new issues. Chronic "spinning" of white/yellow sputum but he doesn't recognize significant cough or wheeze. Spring pollen was not bad this year. Insomnia "heck of a lot better". Wife gives him  Norcxo5-325 + 1 clonazepam 05. We talk about sleep, medications and his age it every visit. He says with that these medicines he can sleep mostly through the night and does not wake up groggy or confused. Wife agrees with his description.  ROS-see HPI Constitutional:   No-   weight loss, night sweats, fevers, chills, +fatigue, lassitude. HEENT:   No-  headaches, difficulty swallowing, tooth/dental problems, sore throat,       No-  sneezing, itching, ear ache, nasal congestion, post nasal drip,  CV:  No-   chest pain, orthopnea, PND, swelling in lower extremities, anasarca, dizziness, palpitations Resp: No-   shortness of breath with exertion or at rest.              No-   productive cough,  No non-productive cough,  No- coughing up of blood.              No-   change in color of mucus.  No- wheezing.   Skin: No-   rash or lesions. GI:  No-   heartburn, indigestion, abdominal pain, nausea, vomiting, GU:  MS:  No-   joint pain or swelling.   Neuro-     nothing unusual Psych:  No- change in mood or affect. No depression or anxiety.  No memory loss.  OBJ- Physical Exam BP 120/72  Pulse 77  Ht 5\' 7"  (1.702 m)  Wt 165 lb (74.844 kg)  BMI 25.84 kg/m2  SpO2 95%  General- Alert, Oriented, Affect-appropriate, Distress- none acute. Wheelchair Skin- rash-none, lesions- none, excoriation- none Lymphadenopathy- none Head- atraumatic            Eyes- Gross vision intact, PERRLA, conjunctivae and secretions clear            Ears- +hard of hearing            Nose- Clear, no-Septal dev, mucus, polyps, erosion, perforation             Throat- Mallampati II , mucosa clear , drainage- none, tonsils- atrophic Neck- flexible , trachea midline, no stridor , thyroid nl, carotid no bruit Chest - symmetrical excursion , unlabored           Heart/CV- IRR , no murmur , no gallop  , no rub, nl s1 s2                           - JVD- none , edema+1-2, stasis changes- none, varices- none           Lung- clear to  P&A, wheeze+, cough- none , dullness-none, rub- none           Chest wall- pacemaker L Abd-  Br/ Gen/ Rectal- Not done, not indicated Extrem- cyanosis- none, clubbing, none, atrophy- none, strength- nl Neuro- grossly intact to observation, brisk, alert, oriented

## 2013-09-25 NOTE — Patient Instructions (Signed)
We can continue present meds  Please call as needed 

## 2013-09-26 NOTE — Progress Notes (Signed)
Remote pacemaker transmission.   

## 2013-10-09 ENCOUNTER — Ambulatory Visit: Payer: Medicare Other | Admitting: Internal Medicine

## 2013-10-13 HISTORY — PX: GASTROSTOMY TUBE PLACEMENT: SHX655

## 2013-10-14 ENCOUNTER — Ambulatory Visit: Payer: Medicare Other | Admitting: Internal Medicine

## 2013-10-14 DIAGNOSIS — Z0289 Encounter for other administrative examinations: Secondary | ICD-10-CM

## 2013-10-19 ENCOUNTER — Emergency Department (HOSPITAL_COMMUNITY): Payer: Medicare Other

## 2013-10-19 ENCOUNTER — Observation Stay (HOSPITAL_COMMUNITY)
Admission: EM | Admit: 2013-10-19 | Discharge: 2013-10-20 | Disposition: A | Discharge status: Home Health | Payer: Medicare Other | Attending: Internal Medicine | Admitting: Internal Medicine

## 2013-10-19 ENCOUNTER — Encounter (HOSPITAL_COMMUNITY): Payer: Self-pay | Admitting: Emergency Medicine

## 2013-10-19 DIAGNOSIS — N259 Disorder resulting from impaired renal tubular function, unspecified: Secondary | ICD-10-CM

## 2013-10-19 DIAGNOSIS — Z950 Presence of cardiac pacemaker: Secondary | ICD-10-CM

## 2013-10-19 DIAGNOSIS — W19XXXA Unspecified fall, initial encounter: Secondary | ICD-10-CM

## 2013-10-19 DIAGNOSIS — G9340 Encephalopathy, unspecified: Secondary | ICD-10-CM

## 2013-10-19 DIAGNOSIS — N183 Chronic kidney disease, stage 3 unspecified: Secondary | ICD-10-CM

## 2013-10-19 DIAGNOSIS — J9600 Acute respiratory failure, unspecified whether with hypoxia or hypercapnia: Secondary | ICD-10-CM

## 2013-10-19 DIAGNOSIS — IMO0002 Reserved for concepts with insufficient information to code with codable children: Secondary | ICD-10-CM

## 2013-10-19 DIAGNOSIS — I4891 Unspecified atrial fibrillation: Secondary | ICD-10-CM

## 2013-10-19 DIAGNOSIS — J330 Polyp of nasal cavity: Secondary | ICD-10-CM

## 2013-10-19 DIAGNOSIS — K219 Gastro-esophageal reflux disease without esophagitis: Secondary | ICD-10-CM | POA: Insufficient documentation

## 2013-10-19 DIAGNOSIS — J9601 Acute respiratory failure with hypoxia: Secondary | ICD-10-CM

## 2013-10-19 DIAGNOSIS — R0902 Hypoxemia: Secondary | ICD-10-CM

## 2013-10-19 DIAGNOSIS — E1129 Type 2 diabetes mellitus with other diabetic kidney complication: Secondary | ICD-10-CM

## 2013-10-19 DIAGNOSIS — Z9181 History of falling: Secondary | ICD-10-CM | POA: Insufficient documentation

## 2013-10-19 DIAGNOSIS — J309 Allergic rhinitis, unspecified: Secondary | ICD-10-CM

## 2013-10-19 DIAGNOSIS — E785 Hyperlipidemia, unspecified: Secondary | ICD-10-CM

## 2013-10-19 DIAGNOSIS — I82409 Acute embolism and thrombosis of unspecified deep veins of unspecified lower extremity: Secondary | ICD-10-CM

## 2013-10-19 DIAGNOSIS — I5032 Chronic diastolic (congestive) heart failure: Secondary | ICD-10-CM

## 2013-10-19 DIAGNOSIS — J441 Chronic obstructive pulmonary disease with (acute) exacerbation: Secondary | ICD-10-CM

## 2013-10-19 DIAGNOSIS — Z9861 Coronary angioplasty status: Secondary | ICD-10-CM | POA: Insufficient documentation

## 2013-10-19 DIAGNOSIS — G4700 Insomnia, unspecified: Secondary | ICD-10-CM

## 2013-10-19 DIAGNOSIS — I10 Essential (primary) hypertension: Secondary | ICD-10-CM

## 2013-10-19 DIAGNOSIS — Z86718 Personal history of other venous thrombosis and embolism: Secondary | ICD-10-CM | POA: Insufficient documentation

## 2013-10-19 DIAGNOSIS — F0390 Unspecified dementia without behavioral disturbance: Secondary | ICD-10-CM | POA: Insufficient documentation

## 2013-10-19 DIAGNOSIS — I6789 Other cerebrovascular disease: Secondary | ICD-10-CM | POA: Insufficient documentation

## 2013-10-19 DIAGNOSIS — J45909 Unspecified asthma, uncomplicated: Secondary | ICD-10-CM

## 2013-10-19 DIAGNOSIS — I495 Sick sinus syndrome: Secondary | ICD-10-CM

## 2013-10-19 DIAGNOSIS — I129 Hypertensive chronic kidney disease with stage 1 through stage 4 chronic kidney disease, or unspecified chronic kidney disease: Secondary | ICD-10-CM | POA: Insufficient documentation

## 2013-10-19 DIAGNOSIS — I739 Peripheral vascular disease, unspecified: Secondary | ICD-10-CM

## 2013-10-19 DIAGNOSIS — J45901 Unspecified asthma with (acute) exacerbation: Principal | ICD-10-CM | POA: Insufficient documentation

## 2013-10-19 DIAGNOSIS — I2699 Other pulmonary embolism without acute cor pulmonale: Secondary | ICD-10-CM

## 2013-10-19 DIAGNOSIS — Z86711 Personal history of pulmonary embolism: Secondary | ICD-10-CM | POA: Insufficient documentation

## 2013-10-19 DIAGNOSIS — G319 Degenerative disease of nervous system, unspecified: Secondary | ICD-10-CM | POA: Insufficient documentation

## 2013-10-19 DIAGNOSIS — Z7901 Long term (current) use of anticoagulants: Secondary | ICD-10-CM | POA: Insufficient documentation

## 2013-10-19 DIAGNOSIS — M459 Ankylosing spondylitis of unspecified sites in spine: Secondary | ICD-10-CM

## 2013-10-19 DIAGNOSIS — J9620 Acute and chronic respiratory failure, unspecified whether with hypoxia or hypercapnia: Secondary | ICD-10-CM

## 2013-10-19 DIAGNOSIS — I2510 Atherosclerotic heart disease of native coronary artery without angina pectoris: Secondary | ICD-10-CM

## 2013-10-19 LAB — CBC WITH DIFFERENTIAL/PLATELET
Basophils Absolute: 0 10*3/uL (ref 0.0–0.1)
Basophils Relative: 0 % (ref 0–1)
EOS ABS: 0.4 10*3/uL (ref 0.0–0.7)
Eosinophils Relative: 3 % (ref 0–5)
HCT: 43.4 % (ref 39.0–52.0)
HEMOGLOBIN: 14.1 g/dL (ref 13.0–17.0)
Lymphocytes Relative: 12 % (ref 12–46)
Lymphs Abs: 2 10*3/uL (ref 0.7–4.0)
MCH: 30.9 pg (ref 26.0–34.0)
MCHC: 32.5 g/dL (ref 30.0–36.0)
MCV: 95.2 fL (ref 78.0–100.0)
MONOS PCT: 7 % (ref 3–12)
Monocytes Absolute: 1.2 10*3/uL — ABNORMAL HIGH (ref 0.1–1.0)
NEUTROS PCT: 78 % — AB (ref 43–77)
Neutro Abs: 13.1 10*3/uL — ABNORMAL HIGH (ref 1.7–7.7)
Platelets: 195 10*3/uL (ref 150–400)
RBC: 4.56 MIL/uL (ref 4.22–5.81)
RDW: 15.2 % (ref 11.5–15.5)
WBC: 16.7 10*3/uL — ABNORMAL HIGH (ref 4.0–10.5)

## 2013-10-19 LAB — GLUCOSE, CAPILLARY
GLUCOSE-CAPILLARY: 353 mg/dL — AB (ref 70–99)
Glucose-Capillary: 386 mg/dL — ABNORMAL HIGH (ref 70–99)

## 2013-10-19 LAB — BASIC METABOLIC PANEL
BUN: 25 mg/dL — AB (ref 6–23)
CO2: 29 mEq/L (ref 19–32)
Calcium: 9.3 mg/dL (ref 8.4–10.5)
Chloride: 102 mEq/L (ref 96–112)
Creatinine, Ser: 1.52 mg/dL — ABNORMAL HIGH (ref 0.50–1.35)
GFR calc Af Amer: 45 mL/min — ABNORMAL LOW (ref >90)
GFR, EST NON AFRICAN AMERICAN: 39 mL/min — AB (ref >90)
GLUCOSE: 198 mg/dL — AB (ref 70–99)
Potassium: 4.3 mEq/L (ref 3.7–5.3)
Sodium: 144 mEq/L (ref 137–147)

## 2013-10-19 LAB — PRO B NATRIURETIC PEPTIDE: Pro B Natriuretic peptide (BNP): 527 pg/mL — ABNORMAL HIGH (ref 0–450)

## 2013-10-19 LAB — I-STAT TROPONIN, ED: Troponin i, poc: 0 ng/mL (ref 0.00–0.08)

## 2013-10-19 LAB — MAGNESIUM: Magnesium: 1.9 mg/dL (ref 1.5–2.5)

## 2013-10-19 LAB — TSH: TSH: 1.72 u[IU]/mL (ref 0.350–4.500)

## 2013-10-19 MED ORDER — INSULIN ASPART 100 UNIT/ML ~~LOC~~ SOLN: 0.0000 [IU] | Freq: Three times a day (TID) | SUBCUTANEOUS | Status: DC

## 2013-10-19 MED ORDER — MORPHINE SULFATE 2 MG/ML IJ SOLN: 1.0000 mg | INTRAMUSCULAR | Status: DC | PRN

## 2013-10-19 MED ORDER — BIOTENE DRY MOUTH MT LIQD
15.0000 mL | Freq: Two times a day (BID) | OROMUCOSAL | Status: DC
  Administered 2013-10-19 – 2013-10-20 (×2): 15 mL via OROMUCOSAL

## 2013-10-19 MED ORDER — INSULIN ASPART 100 UNIT/ML ~~LOC~~ SOLN
0.0000 [IU] | Freq: Every day | SUBCUTANEOUS | Status: DC
  Administered 2013-10-19: 5 [IU] via SUBCUTANEOUS

## 2013-10-19 MED ORDER — IPRATROPIUM-ALBUTEROL 0.5-2.5 (3) MG/3ML IN SOLN
3.0000 mL | Freq: Once | RESPIRATORY_TRACT | Status: AC
  Administered 2013-10-19: 3 mL via RESPIRATORY_TRACT
  Filled 2013-10-19: qty 3

## 2013-10-19 MED ORDER — SODIUM CHLORIDE 0.9 % IJ SOLN: 3.0000 mL | INTRAMUSCULAR | Status: DC | PRN

## 2013-10-19 MED ORDER — HYDROCODONE-ACETAMINOPHEN 5-325 MG PO TABS
0.5000 | ORAL_TABLET | Freq: Three times a day (TID) | ORAL | Status: DC | PRN
  Administered 2013-10-19 – 2013-10-20 (×2): 1 via ORAL
  Filled 2013-10-19 (×2): qty 1

## 2013-10-19 MED ORDER — ACETAMINOPHEN 650 MG RE SUPP: 650.0000 mg | Freq: Four times a day (QID) | RECTAL | Status: DC | PRN

## 2013-10-19 MED ORDER — NITROGLYCERIN 0.4 MG SL SUBL
0.4000 mg | SUBLINGUAL_TABLET | SUBLINGUAL | Status: DC | PRN
  Filled 2013-10-19: qty 1

## 2013-10-19 MED ORDER — LEVALBUTEROL HCL 0.63 MG/3ML IN NEBU
0.6300 mg | INHALATION_SOLUTION | RESPIRATORY_TRACT | Status: DC | PRN
  Filled 2013-10-19: qty 3

## 2013-10-19 MED ORDER — DOXYCYCLINE HYCLATE 100 MG PO TABS
100.0000 mg | ORAL_TABLET | Freq: Two times a day (BID) | ORAL | Status: DC
  Administered 2013-10-19 – 2013-10-20 (×2): 100 mg via ORAL
  Filled 2013-10-19 (×3): qty 1

## 2013-10-19 MED ORDER — QC DAILY MULTIVITAMINS/IRON PO TABS: 1.0000 | ORAL_TABLET | Freq: Every day | ORAL | Status: DC

## 2013-10-19 MED ORDER — ASPIRIN 325 MG PO TABS
325.0000 mg | ORAL_TABLET | Freq: Once | ORAL | Status: AC
  Administered 2013-10-19: 325 mg via ORAL
  Filled 2013-10-19: qty 1

## 2013-10-19 MED ORDER — ONDANSETRON HCL 4 MG PO TABS: 4.0000 mg | ORAL_TABLET | Freq: Four times a day (QID) | ORAL | Status: DC | PRN

## 2013-10-19 MED ORDER — RIVAROXABAN 15 MG PO TABS
15.0000 mg | ORAL_TABLET | Freq: Every day | ORAL | Status: DC
  Administered 2013-10-19: 15 mg via ORAL
  Filled 2013-10-19 (×2): qty 1

## 2013-10-19 MED ORDER — SODIUM CHLORIDE 0.9 % IJ SOLN
3.0000 mL | Freq: Two times a day (BID) | INTRAMUSCULAR | Status: DC
  Administered 2013-10-19 – 2013-10-20 (×2): 3 mL via INTRAVENOUS

## 2013-10-19 MED ORDER — DILTIAZEM HCL ER COATED BEADS 360 MG PO CP24
360.0000 mg | ORAL_CAPSULE | Freq: Every day | ORAL | Status: DC
  Administered 2013-10-19 – 2013-10-20 (×2): 360 mg via ORAL
  Filled 2013-10-19 (×3): qty 1

## 2013-10-19 MED ORDER — ACETAMINOPHEN 325 MG PO TABS: 650.0000 mg | ORAL_TABLET | Freq: Four times a day (QID) | ORAL | Status: DC | PRN

## 2013-10-19 MED ORDER — GUAIFENESIN ER 600 MG PO TB12
600.0000 mg | ORAL_TABLET | Freq: Two times a day (BID) | ORAL | Status: DC
  Administered 2013-10-19 – 2013-10-20 (×2): 600 mg via ORAL
  Filled 2013-10-19 (×3): qty 1

## 2013-10-19 MED ORDER — ONDANSETRON 4 MG PO TBDP
4.0000 mg | ORAL_TABLET | Freq: Once | ORAL | Status: AC
  Administered 2013-10-19: 4 mg via ORAL
  Filled 2013-10-19: qty 1

## 2013-10-19 MED ORDER — ONDANSETRON HCL 4 MG/2ML IJ SOLN: 4.0000 mg | Freq: Four times a day (QID) | INTRAMUSCULAR | Status: DC | PRN

## 2013-10-19 MED ORDER — NITROGLYCERIN 0.3 MG SL SUBL: 0.3000 mg | SUBLINGUAL_TABLET | SUBLINGUAL | Status: DC | PRN

## 2013-10-19 MED ORDER — IPRATROPIUM BROMIDE 0.02 % IN SOLN
0.5000 mg | Freq: Three times a day (TID) | RESPIRATORY_TRACT | Status: DC
  Administered 2013-10-19: 0.5 mg via RESPIRATORY_TRACT
  Filled 2013-10-19: qty 2.5

## 2013-10-19 MED ORDER — CLONAZEPAM 0.5 MG PO TABS
0.5000 mg | ORAL_TABLET | Freq: Every evening | ORAL | Status: DC | PRN
  Administered 2013-10-19: 0.5 mg via ORAL
  Filled 2013-10-19: qty 1

## 2013-10-19 MED ORDER — INSULIN ASPART 100 UNIT/ML ~~LOC~~ SOLN
0.0000 [IU] | Freq: Three times a day (TID) | SUBCUTANEOUS | Status: DC
  Administered 2013-10-19: 15 [IU] via SUBCUTANEOUS

## 2013-10-19 MED ORDER — FUROSEMIDE 40 MG PO TABS
40.0000 mg | ORAL_TABLET | Freq: Every day | ORAL | Status: DC
  Administered 2013-10-19: 40 mg via ORAL
  Filled 2013-10-19 (×2): qty 1

## 2013-10-19 MED ORDER — ADULT MULTIVITAMIN W/MINERALS CH
1.0000 | ORAL_TABLET | Freq: Every day | ORAL | Status: DC
  Administered 2013-10-19 – 2013-10-20 (×2): 1 via ORAL
  Filled 2013-10-19 (×2): qty 1

## 2013-10-19 MED ORDER — BUDESONIDE 0.25 MG/2ML IN SUSP
0.2500 mg | Freq: Four times a day (QID) | RESPIRATORY_TRACT | Status: DC | PRN
  Filled 2013-10-19: qty 2

## 2013-10-19 MED ORDER — SODIUM CHLORIDE 0.9 % IV SOLN: 250.0000 mL | INTRAVENOUS | Status: DC | PRN

## 2013-10-19 MED ORDER — LEVALBUTEROL HCL 0.63 MG/3ML IN NEBU
0.6300 mg | INHALATION_SOLUTION | Freq: Four times a day (QID) | RESPIRATORY_TRACT | Status: DC
  Administered 2013-10-19 – 2013-10-20 (×3): 0.63 mg via RESPIRATORY_TRACT
  Filled 2013-10-19 (×6): qty 3

## 2013-10-19 MED ORDER — PREDNISONE 20 MG PO TABS
60.0000 mg | ORAL_TABLET | Freq: Once | ORAL | Status: AC
  Administered 2013-10-19: 60 mg via ORAL
  Filled 2013-10-19: qty 3

## 2013-10-19 MED ORDER — METHYLPREDNISOLONE SODIUM SUCC 125 MG IJ SOLR
80.0000 mg | Freq: Two times a day (BID) | INTRAMUSCULAR | Status: DC
  Administered 2013-10-19 – 2013-10-20 (×2): 80 mg via INTRAVENOUS
  Filled 2013-10-19 (×4): qty 1.28

## 2013-10-19 MED ORDER — PANTOPRAZOLE SODIUM 40 MG PO TBEC
40.0000 mg | DELAYED_RELEASE_TABLET | Freq: Every day | ORAL | Status: DC
  Administered 2013-10-20: 40 mg via ORAL
  Filled 2013-10-19: qty 1

## 2013-10-19 NOTE — ED Provider Notes (Signed)
CSN: [id number]     Arrival date & time 10/19/13  0645 History   First MD Initiated Contact with Patient 10/19/13 [phone]     Chief Complaint  Patient presents with  . Fall     (Consider location/radiation/quality/duration/timing/severity/associated sxs/prior Treatment) HPI Comments: Pt states his "gravity gave out" while walking to the bathroom this morning. Denies lightheadedness, CP, of syncope prior to fall.   Patient is a 80 y.o. male presenting with fall. The history is provided by the patient and the spouse. No language interpreter was used.  Fall This is a new problem. The current episode started 1 to 2 hours ago. The problem occurs rarely. The problem has not changed since onset.Associated symptoms include chest pain. Pertinent negatives include no abdominal pain, no headaches and no shortness of breath (Pt denies though wife reports gradually worsening SOB prior to fall). Associated symptoms comments: Neck pain, R flank pain. The symptoms are aggravated by twisting. The symptoms are relieved by rest. He has tried nothing for the symptoms. The treatment provided no relief.    Past Medical History  Diagnosis Date  . Presence of permanent cardiac pacemaker 04/2006 upgrade    a. s/p MDT Adapta ADDR01 dual chamber PPM, ser #: [id number] H.  [name] PAF (paroxysmal atrial fibrillation)     a. previously on coumadin-->patient self-d/c'd 11/2012 b/c he was tired of having INR's checked.  . Tachy-brady syndrome   . DM (diabetes mellitus)   . CAD (coronary artery disease)     DES to cx  . Pulmonary embolism 12/2003  . Deep vein thrombophlebitis of leg 2005, 2009    recurrent when off anticoag  . HTN (hypertension)   . Allergic rhinitis   . Asthma   . CKD (chronic kidney disease), stage III   . Nasal polyp    Past Surgical History  Procedure Laterality Date  . Cataract/lens implants    . Nasal polyp surgery    . Coronary angioplasty with stent placement  2008    DES to cx  . Pacemaker  insertion  04/2006    Medtronic Adapta  . Cardioversion N/A 03/20/2013    Procedure: CARDIOVERSION;  Surgeon: Carlena Bjornstad, MD;  Location: [facility] ENDOSCOPY;  Service: Cardiovascular;  Laterality: N/A;   Family History  Problem Relation Age of Onset  . Diabetes Neg Hx   . Hypertension Neg Hx   . Coronary artery disease Neg Hx    History  Substance Use Topics  . Smoking status: Never Smoker   . Smokeless tobacco: Not on file     Comment: married since 1952 to wife Dub Mikes, ret[employer]  . Alcohol Use: No    Review of Systems  Constitutional: Negative for fever, activity change, appetite change and fatigue.  HENT: Negative for congestion, facial swelling, rhinorrhea and trouble swallowing.   Eyes: Negative for photophobia and pain.  Respiratory: Negative for cough, chest tightness and shortness of breath (Pt denies though wife reports gradually worsening SOB prior to fall).   Cardiovascular: Positive for chest pain. Negative for leg swelling.  Gastrointestinal: Negative for nausea, vomiting, abdominal pain, diarrhea and constipation.  Endocrine: Negative for polydipsia and polyuria.  Genitourinary: Negative for dysuria, urgency, decreased urine volume and difficulty urinating.  Musculoskeletal: Negative for back pain and gait problem.  Skin: Negative for color change, rash and wound.  Allergic/Immunologic: Negative for immunocompromised state.  Neurological: Negative for dizziness, facial asymmetry, speech difficulty, weakness, numbness and headaches.  Psychiatric/Behavioral: Negative for confusion, decreased concentration and agitation.  Allergies  Asa buff (mag and Ibuprofen  Home Medications   Prior to Admission medications   Medication Sig Start Date End Date Taking? Authorizing Provider  budesonide (PULMICORT) 0.25 MG/2ML nebulizer solution Take 0.25 mg by nebulization every 6 (six) hours as needed (shortness of breath). Use two times a day   Yes Historical  Provider, MD  clonazePAM (KLONOPIN) 0.5 MG tablet Take 0.5 mg by mouth at bedtime as needed (for sleep).   Yes Historical Provider, MD  diltiazem (CARDIZEM CD) 360 MG 24 hr capsule Take 1 capsule (360 mg total) by mouth daily. 03/17/13  Yes Carlena Bjornstad, MD  furosemide (LASIX) 40 MG tablet Take 1 tablet (40 mg total) by mouth daily. 03/27/13  Yes Deboraha Sprang, MD  glipiZIDE (GLUCOTROL) 5 MG tablet Take 1.5 tablets (7.5 mg total) by mouth daily before breakfast. 05/22/13  Yes Rowe Clack, MD  HYDROcodone-acetaminophen (NORCO/VICODIN) 5-325 MG per tablet Take 0.5-1 tablets by mouth every 8 (eight) hours as needed. prn 05/21/13  Yes Rowe Clack, MD  ipratropium (ATROVENT) 0.02 % nebulizer solution Take 0.5 mg by nebulization 4 (four) times daily - after meals and at bedtime. 02/11/13  Yes Debbe Odea, MD  Multiple Vitamins-Iron (QC DAILY MULTIVITAMINS/IRON) TABS Take 1 tablet by mouth daily.     Yes Historical Provider, MD  PredniSONE 5 MG TBEC Take 5 mg by mouth daily. 02/11/13  Yes Debbe Odea, MD  promethazine-codeine (PHENERGAN WITH CODEINE) 6.25-10 MG/5ML syrup TAKE 1 TEASPOONFUL BY MOUTH 4 TIMES A DAY as needed 09/12/13  Yes Deneise Lever, MD  Rivaroxaban (XARELTO) 15 MG TABS tablet Take 1 tablet (15 mg total) by mouth daily with supper. 03/17/13  Yes Deboraha Sprang, MD  Blood Glucose Monitoring Suppl (ONE TOUCH ULTRA 2) W/DEVICE KIT Use to check blood sugars daily Dx 250.00 05/23/13   Rowe Clack, MD  glucose blood (ONE TOUCH ULTRA TEST) test strip Use to check blood sugars before breakfast Dx 250.00 05/23/13   Rowe Clack, MD  nitroGLYCERIN (NITROSTAT) 0.3 MG SL tablet Place 0.3 mg under the tongue every 5 (five) minutes as needed for chest pain.    Historical Provider, MD  ONE TOUCH LANCETS MISC Use to help check blood sugars before breakfast Dx 250.00 05/23/13   Rowe Clack, MD   BP 123/70  Pulse 84  Temp(Src) 97.4 F (36.3 C) (Oral)  Resp 24  SpO2  94% Physical Exam  Constitutional: He is oriented to person, place, and time. He appears well-developed and well-nourished. No distress.  HENT:  Head: Normocephalic and atraumatic.  Mouth/Throat: No oropharyngeal exudate.  Eyes: Pupils are equal, round, and reactive to light. Left eye exhibits discharge (BL, yellow).  Neck: Normal range of motion. Neck supple. Spinous process tenderness present.    Cardiovascular: Normal rate, regular rhythm and normal heart sounds.  Exam reveals no gallop and no friction rub.   No murmur heard. Pulmonary/Chest: Effort normal. No respiratory distress. He has wheezes in the right upper field, the right middle field, the left upper field and the left middle field. He has rales in the right lower field and the left lower field.  Abdominal: Soft. Bowel sounds are normal. He exhibits no distension and no mass. There is no tenderness. There is no rebound and no guarding.  Musculoskeletal: Normal range of motion. He exhibits edema (2+ blle). He exhibits no tenderness.  Neurological: He is alert and oriented to person, place, and time. He has normal strength.  He displays no tremor. No cranial nerve deficit. He exhibits normal muscle tone. Coordination normal. GCS eye subscore is 4. GCS verbal subscore is 5. GCS motor subscore is 6.  Gait not tested due to fall during initial exam  Skin: Skin is warm and dry.  Psychiatric: He has a normal mood and affect.    ED Course  Procedures (including critical care time) Labs Review Labs Reviewed  CBC WITH DIFFERENTIAL - Abnormal; Notable for the following:    WBC 16.7 (*)    Neutrophils Relative % 78 (*)    Neutro Abs 13.1 (*)    Monocytes Absolute 1.2 (*)    All other components within normal limits  BASIC METABOLIC PANEL - Abnormal; Notable for the following:    Glucose, Bld 198 (*)    BUN 25 (*)    Creatinine, Ser 1.52 (*)    GFR calc non Af Amer 39 (*)    GFR calc Af Amer 45 (*)    All other components within  normal limits  PRO B NATRIURETIC PEPTIDE - Abnormal; Notable for the following:    Pro B Natriuretic peptide (BNP) 527.0 (*)    All other components within normal limits  I-STAT TROPOININ, ED    Imaging Review Ct Abdomen Pelvis Wo Contrast  10/19/2013   CLINICAL DATA:  Fall. Patient on several toe. Right chest wall pain.  EXAM: CT CHEST, ABDOMEN AND PELVIS WITHOUT CONTRAST  TECHNIQUE: Multidetector CT imaging of the chest, abdomen and pelvis was performed following the standard protocol without IV contrast.  COMPARISON:  Abdominal pelvic CT 02/05/2013 and 09/05/2008  FINDINGS: CT CHEST FINDINGS  Lungs are well inflated without consolidation or effusion. There is subtle patchy peripheral increased interstitial markings in a few small bilateral peripheral nodular opacities with the largest measuring 4 mm over the left lower lobe.  Left-sided pacemaker is present. Heart is normal in size. There is calcification in the region of the mitral valve anulus. There is calcified plaque over the left anterior descending and lateral circumflex coronary arteries. There is moderate calcified plaque over the thoracic aorta. There is no significant mediastinal, hilar or axillary adenopathy. No acute fracture.  CT ABDOMEN AND PELVIS FINDINGS  Single 1.7 cm gallstone. The liver, spleen, pancreas and adrenal glands are within normal. Kidneys are normal in size without hydronephrosis or nephrolithiasis. There is no free peritoneal air or free fluid. There is moderate calcified plaque involving the abdominal aorta and iliac arteries. There is moderate diverticulosis of the colon. Appendix is normal.  Pelvic images demonstrate a small left inguinal hernia containing only peritoneal fat. Bladder, prostate and rectum are unremarkable. There are moderate degenerative changes of the spine and hips. There is no acute fracture identified.  IMPRESSION: No acute findings in the chest, abdomen or pelvis.  Minimal patchy increased  interstitial markings with a few small peripheral bilateral pulmonary nodules with the largest measuring 4 mm over the left lower lobe. Recommend follow-up noncontrast chest CT 1 year.  This recommendation follows the consensus statement: Guidelines for Management of Small Pulmonary Nodules Detected on CT Scans: A Statement from the [employer] as published in Radiology 2005; 237:395-400. Online at: [email or web address].arnold.com/.  Cholelithiasis.  Atherosclerotic coronary artery disease.  Moderate diverticulosis of the colon.   Electronically Signed   By: Marin Olp M.D.   On: 10/19/2013 08:36   Ct Head Wo Contrast  10/19/2013   CLINICAL DATA:  History of trauma from a fall. Right-sided neck pain. Patient is on blood  thinners.  EXAM: CT HEAD WITHOUT CONTRAST  CT CERVICAL SPINE WITHOUT CONTRAST  TECHNIQUE: Multidetector CT imaging of the head and cervical spine was performed following the standard protocol without intravenous contrast. Multiplanar CT image reconstructions of the cervical spine were also generated.  COMPARISON:  Head CT 04/22/2013.  Cervical spine CT 04/22/2013.  FINDINGS: CT HEAD FINDINGS  Multiple tiny well-defined foci of low attenuation in the basal ganglia bilaterally, compatible with old lacunar infarcts. Patchy and confluent areas of decreased attenuation are noted throughout the deep and periventricular white matter of the cerebral hemispheres bilaterally, compatible with chronic microvascular ischemic disease. Mild cerebral atrophy. No acute displaced skull fractures are identified. No acute intracranial abnormality. Specifically, no evidence of acute post-traumatic intracranial hemorrhage, no definite regions of acute/subacute cerebral ischemia, no focal mass, mass effect, hydrocephalus or abnormal intra or extra-axial fluid collections. The visualized paranasal sinuses and mastoids are well pneumatized. Posterior aspect of the left mastoid is sclerotic  (unchanged), potentially related to remote infection. Mastoids are otherwise well pneumatized bilaterally. Extensive multifocal mucoperiosteal thickening throughout the paranasal sinuses, with complete opacification of the frontal sinuses bilaterally and the majority of the ethmoid sinuses bilaterally, indicative of longstanding chronic sinusitis. Postoperative changes of bilateral maxillary antrectomy are noted.  CT CERVICAL SPINE FINDINGS  No acute displaced fracture of the cervical spine. Alignment is anatomic. Prevertebral soft tissues are normal. Multilevel degenerative disc disease throughout the cervical spine, with multilevel facet arthropathy. Multiple prominent anterior vertebral body osteophytes, most evident at C4-C5 and C6-C7. Visualized portions of the upper thorax are unremarkable.  IMPRESSION: 1. No evidence of significant acute traumatic injury to the skull, brain or cervical spine. 2. Mild cerebral atrophy with extensive chronic microvascular ischemic changes in the cerebral white matter bilaterally, and multiple old lacunar infarcts in the basal ganglia bilaterally. 3. Mild multilevel degenerative disc disease and cervical spondylosis, as above. 4. Extensive chronic paranasal sinus disease, as above.   Electronically Signed   By: Vinnie Langton M.D.   On: 10/19/2013 08:29   Ct Chest Wo Contrast  10/19/2013   CLINICAL DATA:  Fall. Patient on several toe. Right chest wall pain.  EXAM: CT CHEST, ABDOMEN AND PELVIS WITHOUT CONTRAST  TECHNIQUE: Multidetector CT imaging of the chest, abdomen and pelvis was performed following the standard protocol without IV contrast.  COMPARISON:  Abdominal pelvic CT 02/05/2013 and 09/05/2008  FINDINGS: CT CHEST FINDINGS  Lungs are well inflated without consolidation or effusion. There is subtle patchy peripheral increased interstitial markings in a few small bilateral peripheral nodular opacities with the largest measuring 4 mm over the left lower lobe.   Left-sided pacemaker is present. Heart is normal in size. There is calcification in the region of the mitral valve anulus. There is calcified plaque over the left anterior descending and lateral circumflex coronary arteries. There is moderate calcified plaque over the thoracic aorta. There is no significant mediastinal, hilar or axillary adenopathy. No acute fracture.  CT ABDOMEN AND PELVIS FINDINGS  Single 1.7 cm gallstone. The liver, spleen, pancreas and adrenal glands are within normal. Kidneys are normal in size without hydronephrosis or nephrolithiasis. There is no free peritoneal air or free fluid. There is moderate calcified plaque involving the abdominal aorta and iliac arteries. There is moderate diverticulosis of the colon. Appendix is normal.  Pelvic images demonstrate a small left inguinal hernia containing only peritoneal fat. Bladder, prostate and rectum are unremarkable. There are moderate degenerative changes of the spine and hips. There is no acute fracture identified.  IMPRESSION: No acute findings in the chest, abdomen or pelvis.  Minimal patchy increased interstitial markings with a few small peripheral bilateral pulmonary nodules with the largest measuring 4 mm over the left lower lobe. Recommend follow-up noncontrast chest CT 1 year.  This recommendation follows the consensus statement: Guidelines for Management of Small Pulmonary Nodules Detected on CT Scans: A Statement from the [employer] as published in Radiology 2005; 237:395-400. Online at: [email or web address].arnold.[email or web address]/.  Cholelithiasis.  Atherosclerotic coronary artery disease.  Moderate diverticulosis of the colon.   Electronically Signed   By: Marin Olp M.D.   On: 10/19/2013 08:36   Ct Cervical Spine Wo Contrast  10/19/2013   CLINICAL DATA:  History of trauma from a fall. Right-sided neck pain. Patient is on blood thinners.  EXAM: CT HEAD WITHOUT CONTRAST  CT CERVICAL SPINE WITHOUT CONTRAST   TECHNIQUE: Multidetector CT imaging of the head and cervical spine was performed following the standard protocol without intravenous contrast. Multiplanar CT image reconstructions of the cervical spine were also generated.  COMPARISON:  Head CT 04/22/2013.  Cervical spine CT 04/22/2013.  FINDINGS: CT HEAD FINDINGS  Multiple tiny well-defined foci of low attenuation in the basal ganglia bilaterally, compatible with old lacunar infarcts. Patchy and confluent areas of decreased attenuation are noted throughout the deep and periventricular white matter of the cerebral hemispheres bilaterally, compatible with chronic microvascular ischemic disease. Mild cerebral atrophy. No acute displaced skull fractures are identified. No acute intracranial abnormality. Specifically, no evidence of acute post-traumatic intracranial hemorrhage, no definite regions of acute/subacute cerebral ischemia, no focal mass, mass effect, hydrocephalus or abnormal intra or extra-axial fluid collections. The visualized paranasal sinuses and mastoids are well pneumatized. Posterior aspect of the left mastoid is sclerotic (unchanged), potentially related to remote infection. Mastoids are otherwise well pneumatized bilaterally. Extensive multifocal mucoperiosteal thickening throughout the paranasal sinuses, with complete opacification of the frontal sinuses bilaterally and the majority of the ethmoid sinuses bilaterally, indicative of longstanding chronic sinusitis. Postoperative changes of bilateral maxillary antrectomy are noted.  CT CERVICAL SPINE FINDINGS  No acute displaced fracture of the cervical spine. Alignment is anatomic. Prevertebral soft tissues are normal. Multilevel degenerative disc disease throughout the cervical spine, with multilevel facet arthropathy. Multiple prominent anterior vertebral body osteophytes, most evident at C4-C5 and C6-C7. Visualized portions of the upper thorax are unremarkable.  IMPRESSION: 1. No evidence of  significant acute traumatic injury to the skull, brain or cervical spine. 2. Mild cerebral atrophy with extensive chronic microvascular ischemic changes in the cerebral white matter bilaterally, and multiple old lacunar infarcts in the basal ganglia bilaterally. 3. Mild multilevel degenerative disc disease and cervical spondylosis, as above. 4. Extensive chronic paranasal sinus disease, as above.   Electronically Signed   By: Vinnie Langton M.D.   On: 10/19/2013 08:29   Dg Chest Portable 1 View  10/19/2013   CLINICAL DATA:  History of trauma from a fall.  EXAM: PORTABLE CHEST - 1 VIEW  COMPARISON:  Chest x-ray 02/05/2013.  FINDINGS: Lung volumes are low. Probable subsegmental atelectasis and/or scarring in the left lower lobe. Trace left pleural effusion. No pneumothorax. No consolidative airspace disease. Heart size appears borderline enlarged. Upper mediastinal contours are within normal limits. Atherosclerosis in the thoracic aorta. Left-sided pacemaker device in position with lead tips projecting over the expected location of the right atrium and right ventricular outflow tract. Visualized bony thorax is grossly intact.  IMPRESSION: 1. Small left pleural effusion with mild subsegmental atelectasis and/or scarring in the left lung base.  2. No definite acute displaced rib fractures.  No pneumothorax. 3. Atherosclerosis.   Electronically Signed   By: Vinnie Langton M.D.   On: 10/19/2013 08:13     EKG Interpretation   Date/Time:  Sunday October 19 2013 08:43:54 EDT Ventricular Rate:  76 PR Interval:  70 QRS Duration: 120 QT Interval:  445 QTC Calculation: 500 R Axis:   -68 Text Interpretation:  Sinus rhythm Short PR interval Left anterior  fascicular block Minimal STE inferior leads Confirmed by DOCHERTY  MD,  MEGAN ([id number]) on 10/19/2013 8:59:04 AM      MDM   Final diagnoses:  Hypoxia  Fall    Pt is a 80 y.o. 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 Pt fell onto his walker on his R side, unsure if he hit is head. Complains of neck pain, R lateral chest wall & R flank pain. On PE, Pt hypoxic on RA and also easily hypoxic on 2L while moving or speaking. Lungs w/ crackles at bases and scattered wheezing. +posterior mid-line c-spine pain. No reproducible chest, abdominal, back or extremity pain. 2+ BLLE edema. Given age & Xarelto use, have ordered CT head, c-spine, chest, ab/pelvis.    No acute traumatic findings of CT scans, though pt remains easily hypoxic after duoneb. Will given PO pred, 2nd duoneb treatment. Given he has no hx of O2 use, [facility] consulted & will admit for observation.   1. Hypoxia   2. Blair Promise, MD 10/19/13 1147

## 2013-10-19 NOTE — ED Notes (Signed)
Patient denies chest pain at this time resting comfortably on stretcher with wife at bedside.

## 2013-10-19 NOTE — H&P (Signed)
[facility] History and Physical  DRAGAN TAMBURRINO MEQ:[id number] DOB: [birth year] DOA: 10/19/2013  Referring physician: Dr. Tawnya Crook  PCP: [name], MD   Chief Complaint: fall, SOB and increase wheezing  HPI: [name] is a 80 y.o. male with pmh significant for HTN, DM, tachybrady syndrome, PE, asthma, GERD and CKD stage 3; came to ED after experiencing at mechanical fall at home. No fractures appreciated on x-ray/CT in ED work up. Patient found to be tachypneic and mildly hypoxic on RA. He reports slightly productive cough and increase SOB for the last 2-3 days. X-ray with atelectasis/bronchittic changes, but not vascular congestion or frank infiltrates. On exam patient was diffusely wheezing and having increase rhonchi. [facility] called to admit patient for further evaluation and treatment of his acute resp failure with hypoxia due to asthma exacerbation and bronchitis. Patient denies CP, fever, chills, hemoptysis, hematuria, dysuria, abd pain, nausea, vomiting or any other acute complaints.   Review of Systems:  Negative except as otherwise mentioned on HPI  Past Medical History  Diagnosis Date  . Presence of permanent cardiac pacemaker 04/2006 upgrade    a. s/p MDT Adapta [id number] dual chamber PPM, ser #: [id number] H.  [name] PAF (paroxysmal atrial fibrillation)     a. previously on coumadin-->patient self-d/c'd 11/2012 b/c he was tired of having INR's checked.  . Tachy-brady syndrome   . DM (diabetes mellitus)   . CAD (coronary artery disease)     DES to cx  . Pulmonary embolism 12/2003  . Deep vein thrombophlebitis of leg 2005, 2009    recurrent when off anticoag  . HTN (hypertension)   . Allergic rhinitis   . Asthma   . CKD (chronic kidney disease), stage III   . Nasal polyp    Past Surgical History  Procedure Laterality Date  . Cataract/lens implants    . Nasal polyp surgery    . Coronary angioplasty with stent placement  2008    DES to cx  . Pacemaker  insertion  04/2006    Medtronic Adapta  . Cardioversion N/A 03/20/2013    Procedure: CARDIOVERSION;  Surgeon: Carlena Bjornstad, MD;  Location: [facility] ENDOSCOPY;  Service: Cardiovascular;  Laterality: N/A;   Social History:  reports that he has never smoked. He does not have any smokeless tobacco history on file. He reports that he does not drink alcohol or use illicit drugs.  Allergies  Allergen Reactions  . Asa Buff (Mag [Buffered Aspirin] Nausea Only  . Ibuprofen     REACTION: causes nausea and vomitting    Family History  Problem Relation Age of Onset  . Diabetes Neg Hx   . Hypertension Neg Hx   . Coronary artery disease Neg Hx      Prior to Admission medications   Medication Sig Start Date End Date Taking? Authorizing Provider  budesonide (PULMICORT) 0.25 MG/2ML nebulizer solution Take 0.25 mg by nebulization every 6 (six) hours as needed (shortness of breath). Use two times a day   Yes Historical Provider, MD  clonazePAM (KLONOPIN) 0.5 MG tablet Take 0.5 mg by mouth at bedtime as needed (for sleep).   Yes Historical Provider, MD  diltiazem (CARDIZEM CD) 360 MG 24 hr capsule Take 1 capsule (360 mg total) by mouth daily. 03/17/13  Yes Carlena Bjornstad, MD  furosemide (LASIX) 40 MG tablet Take 1 tablet (40 mg total) by mouth daily. 03/27/13  Yes Deboraha Sprang, MD  glipiZIDE (GLUCOTROL) 5 MG tablet Take 1.5 tablets (7.5 mg total) by  mouth daily before breakfast. 05/22/13  Yes Rowe Clack, MD  HYDROcodone-acetaminophen (NORCO/VICODIN) 5-325 MG per tablet Take 0.5-1 tablets by mouth every 8 (eight) hours as needed. prn 05/21/13  Yes Rowe Clack, MD  ipratropium (ATROVENT) 0.02 % nebulizer solution Take 0.5 mg by nebulization 4 (four) times daily - after meals and at bedtime. 02/11/13  Yes Debbe Odea, MD  Multiple Vitamins-Iron (QC DAILY MULTIVITAMINS/IRON) TABS Take 1 tablet by mouth daily.     Yes Historical Provider, MD  PredniSONE 5 MG TBEC Take 5 mg by mouth daily. 02/11/13  Yes  Debbe Odea, MD  promethazine-codeine (PHENERGAN WITH CODEINE) 6.25-10 MG/5ML syrup TAKE 1 TEASPOONFUL BY MOUTH 4 TIMES A DAY as needed 09/12/13  Yes Deneise Lever, MD  Rivaroxaban (XARELTO) 15 MG TABS tablet Take 1 tablet (15 mg total) by mouth daily with supper. 03/17/13  Yes Deboraha Sprang, MD  Blood Glucose Monitoring Suppl (ONE TOUCH ULTRA 2) W/DEVICE KIT Use to check blood sugars daily Dx 250.00 05/23/13   Rowe Clack, MD  glucose blood (ONE TOUCH ULTRA TEST) test strip Use to check blood sugars before breakfast Dx 250.00 05/23/13   Rowe Clack, MD  nitroGLYCERIN (NITROSTAT) 0.3 MG SL tablet Place 0.3 mg under the tongue every 5 (five) minutes as needed for chest pain.    Historical Provider, MD  ONE TOUCH LANCETS MISC Use to help check blood sugars before breakfast Dx 250.00 05/23/13   Rowe Clack, MD   Physical Exam: Filed Vitals:   10/19/13 1502  BP: 154/87  Pulse: 124  Temp: 97.4 F (36.3 C)  Resp: 23    BP 154/87  Pulse 124  Temp(Src) 97.4 F (36.3 C) (Oral)  Resp 23  Ht 5' 6.93" (1.7 m)  Wt 74.8 kg (164 lb 14.5 oz)  BMI 25.88 kg/m2  SpO2 91%  General:  Appears in mild distress 2/2 to pain on his right side and mild SOB; patient having mild difficulty speaking in full sentences Eyes: PERRL, normal lids, irises & conjunctiva, EOMI, no icterus ENT: hard of hearing, no exudates or erythema inside her mouth, no thrush, no drainage out of ears or nostrils Neck: no LAD, masses or thyromegaly, no JVD Cardiovascular: regular rate, with occasional PVC's; no rubs or gallops Respiratory: diffuse rhonchi, exp wheezing appreciated in upper and mid longs; no crackles Abdomen: soft, nt,nd; positive BS Skin: no rash or induration seen on limited exam Musculoskeletal: grossly normal tone; positive LE edema 2++ bilaterally (unchanged according to patient) Psychiatric: grossly normal mood and affect, speech fluent and appropriate Neurologic: grossly non-focal.           Labs on Admission:  Basic Metabolic Panel:  Recent Labs Lab 10/19/13 0915  NA 144  K 4.3  CL 102  CO2 29  GLUCOSE 198*  BUN 25*  CREATININE 1.52*  CALCIUM 9.3   CBC:  Recent Labs Lab 10/19/13 0915  WBC 16.7*  NEUTROABS 13.1*  HGB 14.1  HCT 43.4  MCV 95.2  PLT 195   BNP (last 3 results)  Recent Labs  02/06/13 0200 02/08/13 0630 10/19/13 0915  PROBNP 3722.0* 5645.0* 527.0*   Radiological Exams on Admission: Ct Abdomen Pelvis Wo Contrast  10/19/2013   CLINICAL DATA:  Fall. Patient on several toe. Right chest wall pain.  EXAM: CT CHEST, ABDOMEN AND PELVIS WITHOUT CONTRAST  TECHNIQUE: Multidetector CT imaging of the chest, abdomen and pelvis was performed following the standard protocol without IV contrast.  COMPARISON:  Abdominal pelvic CT 02/05/2013 and 09/05/2008  FINDINGS: CT CHEST FINDINGS  Lungs are well inflated without consolidation or effusion. There is subtle patchy peripheral increased interstitial markings in a few small bilateral peripheral nodular opacities with the largest measuring 4 mm over the left lower lobe.  Left-sided pacemaker is present. Heart is normal in size. There is calcification in the region of the mitral valve anulus. There is calcified plaque over the left anterior descending and lateral circumflex coronary arteries. There is moderate calcified plaque over the thoracic aorta. There is no significant mediastinal, hilar or axillary adenopathy. No acute fracture.  CT ABDOMEN AND PELVIS FINDINGS  Single 1.7 cm gallstone. The liver, spleen, pancreas and adrenal glands are within normal. Kidneys are normal in size without hydronephrosis or nephrolithiasis. There is no free peritoneal air or free fluid. There is moderate calcified plaque involving the abdominal aorta and iliac arteries. There is moderate diverticulosis of the colon. Appendix is normal.  Pelvic images demonstrate a small left inguinal hernia containing only peritoneal fat. Bladder,  prostate and rectum are unremarkable. There are moderate degenerative changes of the spine and hips. There is no acute fracture identified.  IMPRESSION: No acute findings in the chest, abdomen or pelvis.  Minimal patchy increased interstitial markings with a few small peripheral bilateral pulmonary nodules with the largest measuring 4 mm over the left lower lobe. Recommend follow-up noncontrast chest CT 1 year.  This recommendation follows the consensus statement: Guidelines for Management of Small Pulmonary Nodules Detected on CT Scans: A Statement from the [place] as published in Radiology 2005; 237:395-400. Online at: [email or web address].arnold.[email or web address]/.  Cholelithiasis.  Atherosclerotic coronary artery disease.  Moderate diverticulosis of the colon.   Electronically Signed   By: Marin Olp M.D.   On: 10/19/2013 08:36   Ct Head Wo Contrast  10/19/2013   CLINICAL DATA:  History of trauma from a fall. Right-sided neck pain. Patient is on blood thinners.  EXAM: CT HEAD WITHOUT CONTRAST  CT CERVICAL SPINE WITHOUT CONTRAST  TECHNIQUE: Multidetector CT imaging of the head and cervical spine was performed following the standard protocol without intravenous contrast. Multiplanar CT image reconstructions of the cervical spine were also generated.  COMPARISON:  Head CT 04/22/2013.  Cervical spine CT 04/22/2013.  FINDINGS: CT HEAD FINDINGS  Multiple tiny well-defined foci of low attenuation in the basal ganglia bilaterally, compatible with old lacunar infarcts. Patchy and confluent areas of decreased attenuation are noted throughout the deep and periventricular white matter of the cerebral hemispheres bilaterally, compatible with chronic microvascular ischemic disease. Mild cerebral atrophy. No acute displaced skull fractures are identified. No acute intracranial abnormality. Specifically, no evidence of acute post-traumatic intracranial hemorrhage, no definite regions of acute/subacute  cerebral ischemia, no focal mass, mass effect, hydrocephalus or abnormal intra or extra-axial fluid collections. The visualized paranasal sinuses and mastoids are well pneumatized. Posterior aspect of the left mastoid is sclerotic (unchanged), potentially related to remote infection. Mastoids are otherwise well pneumatized bilaterally. Extensive multifocal mucoperiosteal thickening throughout the paranasal sinuses, with complete opacification of the frontal sinuses bilaterally and the majority of the ethmoid sinuses bilaterally, indicative of longstanding chronic sinusitis. Postoperative changes of bilateral maxillary antrectomy are noted.  CT CERVICAL SPINE FINDINGS  No acute displaced fracture of the cervical spine. Alignment is anatomic. Prevertebral soft tissues are normal. Multilevel degenerative disc disease throughout the cervical spine, with multilevel facet arthropathy. Multiple prominent anterior vertebral body osteophytes, most evident at C4-C5 and C6-C7. Visualized portions of the upper thorax are unremarkable.  IMPRESSION: 1. No evidence of significant acute traumatic injury to the skull, brain or cervical spine. 2. Mild cerebral atrophy with extensive chronic microvascular ischemic changes in the cerebral white matter bilaterally, and multiple old lacunar infarcts in the basal ganglia bilaterally. 3. Mild multilevel degenerative disc disease and cervical spondylosis, as above. 4. Extensive chronic paranasal sinus disease, as above.   Electronically Signed   By: Vinnie Langton M.D.   On: 10/19/2013 08:29   Ct Chest Wo Contrast  10/19/2013   CLINICAL DATA:  Fall. Patient on several toe. Right chest wall pain.  EXAM: CT CHEST, ABDOMEN AND PELVIS WITHOUT CONTRAST  TECHNIQUE: Multidetector CT imaging of the chest, abdomen and pelvis was performed following the standard protocol without IV contrast.  COMPARISON:  Abdominal pelvic CT 02/05/2013 and 09/05/2008  FINDINGS: CT CHEST FINDINGS  Lungs are well  inflated without consolidation or effusion. There is subtle patchy peripheral increased interstitial markings in a few small bilateral peripheral nodular opacities with the largest measuring 4 mm over the left lower lobe.  Left-sided pacemaker is present. Heart is normal in size. There is calcification in the region of the mitral valve anulus. There is calcified plaque over the left anterior descending and lateral circumflex coronary arteries. There is moderate calcified plaque over the thoracic aorta. There is no significant mediastinal, hilar or axillary adenopathy. No acute fracture.  CT ABDOMEN AND PELVIS FINDINGS  Single 1.7 cm gallstone. The liver, spleen, pancreas and adrenal glands are within normal. Kidneys are normal in size without hydronephrosis or nephrolithiasis. There is no free peritoneal air or free fluid. There is moderate calcified plaque involving the abdominal aorta and iliac arteries. There is moderate diverticulosis of the colon. Appendix is normal.  Pelvic images demonstrate a small left inguinal hernia containing only peritoneal fat. Bladder, prostate and rectum are unremarkable. There are moderate degenerative changes of the spine and hips. There is no acute fracture identified.  IMPRESSION: No acute findings in the chest, abdomen or pelvis.  Minimal patchy increased interstitial markings with a few small peripheral bilateral pulmonary nodules with the largest measuring 4 mm over the left lower lobe. Recommend follow-up noncontrast chest CT 1 year.  This recommendation follows the consensus statement: Guidelines for Management of Small Pulmonary Nodules Detected on CT Scans: A Statement from the [place] as published in Radiology 2005; 237:395-400. Online at: [email or web address].arnold.[email or web address]/.  Cholelithiasis.  Atherosclerotic coronary artery disease.  Moderate diverticulosis of the colon.   Electronically Signed   By: Marin Olp M.D.   On: 10/19/2013 08:36    Ct Cervical Spine Wo Contrast  10/19/2013   CLINICAL DATA:  History of trauma from a fall. Right-sided neck pain. Patient is on blood thinners.  EXAM: CT HEAD WITHOUT CONTRAST  CT CERVICAL SPINE WITHOUT CONTRAST  TECHNIQUE: Multidetector CT imaging of the head and cervical spine was performed following the standard protocol without intravenous contrast. Multiplanar CT image reconstructions of the cervical spine were also generated.  COMPARISON:  Head CT 04/22/2013.  Cervical spine CT 04/22/2013.  FINDINGS: CT HEAD FINDINGS  Multiple tiny well-defined foci of low attenuation in the basal ganglia bilaterally, compatible with old lacunar infarcts. Patchy and confluent areas of decreased attenuation are noted throughout the deep and periventricular white matter of the cerebral hemispheres bilaterally, compatible with chronic microvascular ischemic disease. Mild cerebral atrophy. No acute displaced skull fractures are identified. No acute intracranial abnormality. Specifically, no evidence of acute post-traumatic intracranial hemorrhage, no definite regions of acute/subacute cerebral ischemia, no focal  mass, mass effect, hydrocephalus or abnormal intra or extra-axial fluid collections. The visualized paranasal sinuses and mastoids are well pneumatized. Posterior aspect of the left mastoid is sclerotic (unchanged), potentially related to remote infection. Mastoids are otherwise well pneumatized bilaterally. Extensive multifocal mucoperiosteal thickening throughout the paranasal sinuses, with complete opacification of the frontal sinuses bilaterally and the majority of the ethmoid sinuses bilaterally, indicative of longstanding chronic sinusitis. Postoperative changes of bilateral maxillary antrectomy are noted.  CT CERVICAL SPINE FINDINGS  No acute displaced fracture of the cervical spine. Alignment is anatomic. Prevertebral soft tissues are normal. Multilevel degenerative disc disease throughout the cervical spine,  with multilevel facet arthropathy. Multiple prominent anterior vertebral body osteophytes, most evident at C4-C5 and C6-C7. Visualized portions of the upper thorax are unremarkable.  IMPRESSION: 1. No evidence of significant acute traumatic injury to the skull, brain or cervical spine. 2. Mild cerebral atrophy with extensive chronic microvascular ischemic changes in the cerebral white matter bilaterally, and multiple old lacunar infarcts in the basal ganglia bilaterally. 3. Mild multilevel degenerative disc disease and cervical spondylosis, as above. 4. Extensive chronic paranasal sinus disease, as above.   Electronically Signed   By: Vinnie Langton M.D.   On: 10/19/2013 08:29   Dg Chest Portable 1 View  10/19/2013   CLINICAL DATA:  History of trauma from a fall.  EXAM: PORTABLE CHEST - 1 VIEW  COMPARISON:  Chest x-ray 02/05/2013.  FINDINGS: Lung volumes are low. Probable subsegmental atelectasis and/or scarring in the left lower lobe. Trace left pleural effusion. No pneumothorax. No consolidative airspace disease. Heart size appears borderline enlarged. Upper mediastinal contours are within normal limits. Atherosclerosis in the thoracic aorta. Left-sided pacemaker device in position with lead tips projecting over the expected location of the right atrium and right ventricular outflow tract. Visualized bony thorax is grossly intact.  IMPRESSION: 1. Small left pleural effusion with mild subsegmental atelectasis and/or scarring in the left lung base. 2. No definite acute displaced rib fractures.  No pneumothorax. 3. Atherosclerosis.   Electronically Signed   By: Vinnie Langton M.D.   On: 10/19/2013 08:13    EKG:  Regular rate (76); left anterior fascicular block; no acute ischemic changes.  Assessment/Plan 1-Acute respiratory failure with hypoxia: secondary to bronchitis and asthma exacerbation -will admit to med surg floor -start patient on solumedrol and doxycycline -PRN and schedule nebulizer  treatments -flutter valve and PRN supplemental oxygen   2-DM (diabetes mellitus), type 2 with renal complications: will check A1C -will hold glipizide while inpatient -will start SSI  3-HYPERTENSION: stable. Will continue current antihypertensive regimen -will use low sodium diet  4-Embolism, pulmonary with infarction, hx of: continue xarelto  5-Atrial fibrillation: rate controlled at this moment -continue cardizem and xarelto  6-Chronic renal disease, stage 3, moderately decreased glomerular filtration rate between 30-59 mL/min/1.73 square meter: stable and with Cr at baseline -will monitor and follow trend  7-GERD: continue PPI  8-Fall: mechanical in nature. -no acute fractures -continue PRN pain meds -PT for evaluation and rec's has been requested  9-chronic diastolic heart failure: compensated. -follow low sodium diet -daily weights and strict intake and output -continue lasix PO   Code Status: DNR Family Communication: no family at bedside  Disposition Plan: LOS < 2 midnights, observation; med-surg bed  Time spent: 50 minutes  Barton Dubois [facility] Pager [phone]  **Disclaimer: This note may have been dictated with voice recognition software. Similar sounding words can inadvertently be transcribed and this note may contain transcription errors which may  not have been corrected upon publication of note.**

## 2013-10-19 NOTE — Progress Notes (Signed)
Patient trasfered from ED to 5W08 via wheelchair; alert and oriented x 4; no complaints of pain; IV saline locked in RFA;  skin intact, dry and cold - redness sacral area - blanchable. Orient patient to room and unit;  gave patient care guide; instructed how to use the call bell and  fall risk precautions. Will continue to monitor the patient.

## 2013-10-19 NOTE — ED Notes (Addendum)
Per EMS, pt had a mechanical fall at home onto carpet. Pt denies dizziness. Pt reports having right sided flank pain, right sided rib pain and right sided neck pain, but denies cervical tenderness. Pt denies LOC and denies hitting head. Pt alert x4. Pt on blood thinners.

## 2013-10-20 DIAGNOSIS — I5032 Chronic diastolic (congestive) heart failure: Secondary | ICD-10-CM

## 2013-10-20 LAB — CBC
HEMATOCRIT: 42.5 % (ref 39.0–52.0)
Hemoglobin: 13.7 g/dL (ref 13.0–17.0)
MCH: 30.5 pg (ref 26.0–34.0)
MCHC: 32.2 g/dL (ref 30.0–36.0)
MCV: 94.7 fL (ref 78.0–100.0)
Platelets: 174 10*3/uL (ref 150–400)
RBC: 4.49 MIL/uL (ref 4.22–5.81)
RDW: 15.1 % (ref 11.5–15.5)
WBC: 18.8 10*3/uL — ABNORMAL HIGH (ref 4.0–10.5)

## 2013-10-20 LAB — COMPREHENSIVE METABOLIC PANEL
ALK PHOS: 87 U/L (ref 39–117)
ALT: 12 U/L (ref 0–53)
AST: 17 U/L (ref 0–37)
Albumin: 3.2 g/dL — ABNORMAL LOW (ref 3.5–5.2)
BUN: 31 mg/dL — ABNORMAL HIGH (ref 6–23)
CO2: 24 meq/L (ref 19–32)
Calcium: 9.5 mg/dL (ref 8.4–10.5)
Chloride: 97 mEq/L (ref 96–112)
Creatinine, Ser: 1.68 mg/dL — ABNORMAL HIGH (ref 0.50–1.35)
GFR, EST AFRICAN AMERICAN: 40 mL/min — AB (ref >90)
GFR, EST NON AFRICAN AMERICAN: 34 mL/min — AB (ref >90)
GLUCOSE: 396 mg/dL — AB (ref 70–99)
POTASSIUM: 5 meq/L (ref 3.7–5.3)
Sodium: 138 mEq/L (ref 137–147)
Total Bilirubin: 0.6 mg/dL (ref 0.3–1.2)
Total Protein: 6.2 g/dL (ref 6.0–8.3)

## 2013-10-20 LAB — HEMOGLOBIN A1C
Hgb A1c MFr Bld: 9.3 % — ABNORMAL HIGH (ref <5.7)
Mean Plasma Glucose: 220 mg/dL — ABNORMAL HIGH (ref <117)

## 2013-10-20 LAB — GLUCOSE, CAPILLARY
GLUCOSE-CAPILLARY: 365 mg/dL — AB (ref 70–99)
GLUCOSE-CAPILLARY: 280 mg/dL — AB (ref 70–99)

## 2013-10-20 MED ORDER — METHYLPREDNISOLONE SODIUM SUCC 40 MG IJ SOLR
40.0000 mg | Freq: Two times a day (BID) | INTRAMUSCULAR | Status: DC
  Filled 2013-10-20 (×2): qty 1

## 2013-10-20 MED ORDER — METOPROLOL TARTRATE 25 MG PO TABS: 25.0000 mg | ORAL_TABLET | Freq: Two times a day (BID) | ORAL | Status: DC

## 2013-10-20 MED ORDER — INSULIN ASPART 100 UNIT/ML ~~LOC~~ SOLN: 0.0000 [IU] | Freq: Every day | SUBCUTANEOUS | Status: DC

## 2013-10-20 MED ORDER — IPRATROPIUM BROMIDE 0.02 % IN SOLN
0.5000 mg | Freq: Three times a day (TID) | RESPIRATORY_TRACT | Status: DC
  Administered 2013-10-20: 0.5 mg via RESPIRATORY_TRACT
  Filled 2013-10-20: qty 2.5

## 2013-10-20 MED ORDER — INSULIN ASPART 100 UNIT/ML ~~LOC~~ SOLN
0.0000 [IU] | Freq: Three times a day (TID) | SUBCUTANEOUS | Status: DC
  Administered 2013-10-20: 12:00:00 via SUBCUTANEOUS
  Administered 2013-10-20: 20 [IU] via SUBCUTANEOUS

## 2013-10-20 MED ORDER — PREDNISONE 20 MG PO TABS
40.0000 mg | ORAL_TABLET | Freq: Every day | ORAL | Status: DC
  Administered 2013-10-20: 40 mg via ORAL
  Filled 2013-10-20 (×2): qty 2

## 2013-10-20 MED ORDER — ALBUTEROL SULFATE HFA 108 (90 BASE) MCG/ACT IN AERS: 2.0000 | INHALATION_SPRAY | RESPIRATORY_TRACT | Status: DC | PRN

## 2013-10-20 MED ORDER — LEVALBUTEROL HCL 0.63 MG/3ML IN NEBU: 0.6300 mg | INHALATION_SOLUTION | Freq: Two times a day (BID) | RESPIRATORY_TRACT | Status: DC

## 2013-10-20 MED ORDER — FUROSEMIDE 10 MG/ML IJ SOLN
40.0000 mg | Freq: Once | INTRAMUSCULAR | Status: AC
  Administered 2013-10-20: 40 mg via INTRAVENOUS
  Filled 2013-10-20: qty 4

## 2013-10-20 MED ORDER — DOXYCYCLINE HYCLATE 100 MG PO TABS: 100.0000 mg | ORAL_TABLET | Freq: Two times a day (BID) | ORAL | Status: DC

## 2013-10-20 MED ORDER — PREDNISONE 5 MG PO TBEC: DELAYED_RELEASE_TABLET | ORAL | Status: DC

## 2013-10-20 MED ORDER — GUAIFENESIN ER 600 MG PO TB12: 600.0000 mg | ORAL_TABLET | Freq: Two times a day (BID) | ORAL | Status: DC

## 2013-10-20 MED ORDER — LEVALBUTEROL HCL 0.63 MG/3ML IN NEBU: 0.6300 mg | INHALATION_SOLUTION | Freq: Three times a day (TID) | RESPIRATORY_TRACT | Status: DC

## 2013-10-20 MED ORDER — LEVALBUTEROL HCL 0.63 MG/3ML IN NEBU: 0.6300 mg | INHALATION_SOLUTION | Freq: Four times a day (QID) | RESPIRATORY_TRACT | Status: DC | PRN

## 2013-10-20 MED ORDER — LEVALBUTEROL HCL 0.63 MG/3ML IN NEBU
0.6300 mg | INHALATION_SOLUTION | Freq: Three times a day (TID) | RESPIRATORY_TRACT | Status: DC
  Administered 2013-10-20: 0.63 mg via RESPIRATORY_TRACT

## 2013-10-20 MED ORDER — IPRATROPIUM BROMIDE 0.02 % IN SOLN
0.5000 mg | Freq: Four times a day (QID) | RESPIRATORY_TRACT | Status: DC
  Administered 2013-10-20: 0.5 mg via RESPIRATORY_TRACT
  Filled 2013-10-20: qty 2.5

## 2013-10-20 MED ORDER — METOPROLOL TARTRATE 25 MG PO TABS
25.0000 mg | ORAL_TABLET | Freq: Two times a day (BID) | ORAL | Status: DC
  Administered 2013-10-20: 25 mg via ORAL
  Filled 2013-10-20 (×2): qty 1

## 2013-10-20 MED ORDER — GLIPIZIDE 5 MG PO TABS
7.5000 mg | ORAL_TABLET | Freq: Every day | ORAL | Status: DC
  Administered 2013-10-20: 7.5 mg via ORAL
  Filled 2013-10-20 (×2): qty 1

## 2013-10-20 NOTE — Progress Notes (Signed)
CARE MANAGEMENT NOTE 10/20/2013  Patient:  [name], [name]   Account Number:  [id number]  Date Initiated:  10/20/2013  Documentation initiated by:  [facility]  Subjective/Objective Assessment:   Hypoxia, fall     Action/Plan:   SNF vs HH   Anticipated DC Date:  10/20/2013   Anticipated DC Plan:  [place]  CM consult      [facility] Choice  HOME HEALTH   Choice offered to / List presented to:  C-1 Patient        [place] arranged  [place]      [place].   Status of service:  Completed, signed off Medicare Important Message given?  YES (If response is "NO", the following Medicare IM given date fields will be blank) Date Medicare IM given:  10/19/2013 Date Additional Medicare IM given:    Discharge Disposition:  [place]  Per UR Regulation:    If discussed at Long Length of Stay Meetings, dates discussed:    Comments:  10/20/2013 1240 NCM spoke to pt and offered choice for [facility]. Pt gave permission to speak to his wife also. States he had [facility] in the past. States he has RW at home. Requesting light-weight wheelchair. NCM contacted wife and she states he has nebulizer at home. Notified [facility] rep for [facility] [employer] and DME for home. Jonnie Finner RN CCM Case Mgmt phone [phone]

## 2013-10-20 NOTE — Discharge Summary (Signed)
[name] to be D/C'd Home per MD order.  Discussed with the patient/family and all questions fully answered.    Medication List         albuterol 108 (90 BASE) MCG/ACT inhaler  Commonly known as:  PROVENTIL HFA;VENTOLIN HFA  Inhale 2 puffs into the lungs every 4 (four) hours as needed for wheezing or shortness of breath.     budesonide 0.25 MG/2ML nebulizer solution  Commonly known as:  PULMICORT  Take 0.25 mg by nebulization every 6 (six) hours as needed (shortness of breath). Use two times a day     clonazePAM 0.5 MG tablet  Commonly known as:  KLONOPIN  Take 0.5 mg by mouth at bedtime as needed (for sleep).     diltiazem 360 MG 24 hr capsule  Commonly known as:  CARDIZEM CD  Take 1 capsule (360 mg total) by mouth daily.     doxycycline 100 MG tablet  Commonly known as:  VIBRA-TABS  Take 1 tablet (100 mg total) by mouth every 12 (twelve) hours.     furosemide 40 MG tablet  Commonly known as:  LASIX  Take 1 tablet (40 mg total) by mouth daily.     glipiZIDE 5 MG tablet  Commonly known as:  GLUCOTROL  Take 1.5 tablets (7.5 mg total) by mouth daily before breakfast.     glucose blood test strip  Commonly known as:  ONE TOUCH ULTRA TEST  - Use to check blood sugars before breakfast  - Dx 250.00     guaiFENesin 600 MG 12 hr tablet  Commonly known as:  MUCINEX  Take 1 tablet (600 mg total) by mouth 2 (two) times daily.     HYDROcodone-acetaminophen 5-325 MG per tablet  Commonly known as:  NORCO/VICODIN  Take 0.5-1 tablets by mouth every 8 (eight) hours as needed. prn     ipratropium 0.02 % nebulizer solution  Commonly known as:  ATROVENT  Take 0.5 mg by nebulization 4 (four) times daily - after meals and at bedtime.     levalbuterol 0.63 MG/3ML nebulizer solution  Commonly known as:  XOPENEX  Take 3 mLs (0.63 mg total) by nebulization every 6 (six) hours as needed for wheezing or shortness of breath.     metoprolol tartrate 25 MG tablet  Commonly known  as:  LOPRESSOR  Take 1 tablet (25 mg total) by mouth 2 (two) times daily.     nitroGLYCERIN 0.3 MG SL tablet  Commonly known as:  NITROSTAT  Place 0.3 mg under the tongue every 5 (five) minutes as needed for chest pain.     ONE TOUCH LANCETS Misc  - Use to help check blood sugars before breakfast  - Dx 250.00     ONE TOUCH ULTRA 2 W/DEVICE Kit  - Use to check blood sugars daily  - Dx 250.00     PredniSONE 5 MG Tbec  - Take 8 tablets (40 mg) daily for 3 days, then,   - Take 6 tablets (30 mg) daily for 3 days, then,   - Take 4 tablets (20 mg) daily for 3 days, then,   - Take 2 tablets (10 mg) daily for 3 days, then,   - Then continue 1 tablets (5 mg) daily till seen by PCP     promethazine-codeine 6.25-10 MG/5ML syrup  Commonly known as:  PHENERGAN with CODEINE  TAKE 1 TEASPOONFUL BY MOUTH 4 TIMES A DAY as needed     QC DAILY MULTIVITAMINS/IRON Tabs  Take  1 tablet by mouth daily.     Rivaroxaban 15 MG Tabs tablet  Commonly known as:  XARELTO  Take 1 tablet (15 mg total) by mouth daily with supper.        VVS, Skin clean, dry and intact without evidence of skin break down, no evidence of skin tears noted. IV catheter discontinued intact. Site without signs and symptoms of complications. Dressing and pressure applied.  An After Visit Summary was printed and given to the patient.  D/c education completed with patient/family including follow up instructions, medication list, d/c activities limitations if indicated, with other d/c instructions as indicated by MD - patient able to verbalize understanding, all questions fully answered.   Patient instructed to return to ED, call 911, or call MD for any changes in condition.   Patient escorted via [place], and D/C home via private auto.  [name] 10/20/2013 2:19 PM

## 2013-10-20 NOTE — Evaluation (Signed)
Physical Therapy Evaluation Patient Details Name: [name] MRN: [id number] DOB: May 13, 1921 Today's Date: 10/20/2013   History of Present Illness  [name] is a 80 y.o. male with pmh significant for HTN, DM, tachybrady syndrome, PE, asthma, GERD and CKD stage 3; came to ED after experiencing at mechanical fall at home  Clinical Impression  Pt pleasantly confused and unaware of place, date, situation with decreased STM, decreased safety awareness and requires cues and assist for all mobility. Family not present to confirm PLOF or state assist available at home. IF family able to assist with mobility then home with HHPT if not recommend SNF for DC. Pt will benefit from acute therapy to maximize mobility, strength, function, safety and balance to decreased burden of care and fall risk.     Follow Up Recommendations Home health PT;Supervision/Assistance - 24 hour;SNF (if family unable to provide 24 hr assist then SNF)    Equipment Recommendations       Recommendations for Other Services OT consult     Precautions / Restrictions Precautions Precautions: Fall Restrictions Weight Bearing Restrictions: No      Mobility  Bed Mobility Overal bed mobility: Modified Independent             General bed mobility comments: pt required use of rail and HOB 20degrees  Transfers Overall transfer level: Needs assistance   Transfers: Sit to/from Stand Sit to Stand: Min guard         General transfer comment: cues for hand placement and safety with transfers  Ambulation/Gait Ambulation/Gait assistance: Min guard Ambulation Distance (Feet): 100 Feet Assistive device: Rolling walker (2 wheeled) Gait Pattern/deviations: Step-through pattern;Decreased stride length;Trunk flexed;Narrow base of support   Gait velocity interpretation: Below normal speed for age/gender General Gait Details: cues for posture, position in RW, directional cues and safety  Stairs             Wheelchair Mobility    Modified Rankin (Stroke Patients Only)       Balance Overall balance assessment: History of Falls;Needs assistance   Sitting balance-Leahy Scale: Fair       Standing balance-Leahy Scale: Poor                               Pertinent Vitals/Pain No pain HR 102-133 sats 90-92% RA    Home Living Family/patient expects to be discharged to:: Private residence Living Arrangements: Spouse/significant other Available Help at Discharge: Family Type of Home: House           Additional Comments: pt is poor historian and unable to give details of PLOF. Pt states he has wife and son at home but that they both have left him along with his dog    Prior Function Level of Independence: Independent with assistive device(s)         Comments: pt reports being Mod I but no family available to confirm and pt with fall immediately prior to admission     Hand Dominance        Extremity/Trunk Assessment   Upper Extremity Assessment: Generalized weakness           Lower Extremity Assessment: Generalized weakness      Cervical / Trunk Assessment: Kyphotic  Communication   Communication: HOH  Cognition Arousal/Alertness: Awake/alert Behavior During Therapy: WFL for tasks assessed/performed Overall Cognitive Status: No family/caregiver present to determine baseline cognitive functioning Area of Impairment: Orientation;Safety/judgement;Awareness;Problem solving;Memory;Attention Orientation Level: Disoriented to;Time;Situation;Place  Current Attention Level: Sustained Memory: Decreased short-term memory   Safety/Judgement: Decreased awareness of safety   Problem Solving: Decreased initiation;Slow processing;Requires verbal cues General Comments: Pt repeatedly stating his house has moved and the hospital is new    General Comments      Exercises        Assessment/Plan    PT Assessment Patient needs continued PT services  PT  Diagnosis Difficulty walking;Altered mental status   PT Problem List Decreased strength;Decreased cognition;Decreased activity tolerance;Decreased balance;Decreased mobility;Decreased knowledge of use of DME  PT Treatment Interventions Gait training;Functional mobility training;Therapeutic activities;Therapeutic exercise;Patient/family education;DME instruction;Balance training   PT Goals (Current goals can be found in the Care Plan section) Acute Rehab PT Goals Patient Stated Goal: return home PT Goal Formulation: With patient Time For Goal Achievement: 11/03/13 Potential to Achieve Goals: Fair    Frequency Min 3X/week   Barriers to discharge Decreased caregiver support (unsure of family level of assist at home)      Co-evaluation               End of Session Equipment Utilized During Treatment: Gait belt Activity Tolerance: Patient limited by fatigue Patient left: in chair;with call bell/phone within reach;with chair alarm set Nurse Communication: Mobility status;Precautions    Functional Assessment Tool Used: clinical judgement Functional Limitation: Mobility: Walking and moving around Mobility: Walking and Moving Around Current Status [phone]): At least 20 percent but less than 40 percent impaired, limited or restricted Mobility: Walking and Moving Around Goal Status [phone]): At least 1 percent but less than 20 percent impaired, limited or restricted    Time: 0739-0758 PT Time Calculation (min): 19 min   Charges:   PT Evaluation $Initial PT Evaluation Tier I: 1 Procedure PT Treatments $Therapeutic Activity: 8-22 mins   PT G Codes:   Functional Assessment Tool Used: clinical judgement Functional Limitation: Mobility: Walking and moving around    [employer] 10/20/2013, 9:13 AM Elwyn Reach, [place]

## 2013-10-20 NOTE — Progress Notes (Signed)
Inpatient Diabetes Program Recommendations  AACE/ADA: New Consensus Statement on Inpatient Glycemic Control (2013)  Target Ranges:  Prepandial:   less than 140 mg/dL      Peak postprandial:   less than 180 mg/dL (1-2 hours)      Critically ill patients:  140 - 180 mg/dL     Results for [name], [name] (MRN [id number]) as of 10/20/2013 08:47  Ref. Range 10/19/2013 17:07 10/19/2013 22:02  Glucose-Capillary Latest Range: 70-99 mg/dL 353 (H) 386 (H)    Results for [name], [name] (MRN [id number]) as of 10/20/2013 08:47  Ref. Range 10/20/2013 08:01  Glucose-Capillary Latest Range: 70-99 mg/dL 365 (H)    Results for [name], [name] (MRN [id number]) as of 10/20/2013 08:47  Ref. Range 10/19/2013 17:10  Hemoglobin A1C Latest Range: <5.7 % 9.3 (H)    Admitted with Hypoxia, Fall.  History of DM2, CAD, HTN, CKD 3.  Home DM meds: Glipizide 7.5 mg daily  PCP: Dr. [name]   **Note patient started on Home dose of Glipizide + Novolog Resistant SSI.  Patient also receiving IV Solumedrol 40 mg Q12 hours.  AM CBG quite elevated at 365 mg/dl.   MD- Please consider adding basal insulin to patient's hospital regimen while patient is getting IV steroids- Recommend starting Levemir 10 units QHS (~0.15 units/kg dosing)  Patient also needs follow-up with Dr. Asa Lente for elevated A1c of 9.3%.  Though goals for patients >70 years of age are less tight per ADA, patient may need home DM medication adjustment per his PCP.    Will follow Wyn Quaker RN, MSN, CDE Diabetes Coordinator Inpatient Diabetes Program Team Pager: [phone] (8a-10p)

## 2013-10-20 NOTE — Discharge Summary (Addendum)
PATIENT DETAILS Name: [name] Age: 80 y.o. Sex: male Date of Birth: 13-Oct-1920 MRN: [id number]. Admit Date: 10/19/2013 Admitting Physician: No admitting provider for patient encounter. FXO:VANVBTY Asa Lente, MD  Recommendations for Outpatient Follow-up:  1. Optimize asthma medications 2. If falls become recurrent may need to revisit long term anticoagulation  PRIMARY DISCHARGE DIAGNOSIS:  Principal Problem:   Acute respiratory failure with hypoxia Active Problems:   DM (diabetes mellitus), type 2 with renal complications   HYPERTENSION   Embolism, pulmonary with infarction, hx of   Atrial fibrillation   Asthma with bronchitis   Chronic renal disease, stage 3, moderately decreased glomerular filtration rate between 30-59 mL/min/1.73 square meter      PAST MEDICAL HISTORY: Past Medical History  Diagnosis Date  . Presence of permanent cardiac pacemaker 04/2006 upgrade    a. s/p MDT Adapta ADDR01 dual chamber PPM, ser #: [id number] H.  [name] PAF (paroxysmal atrial fibrillation)     a. previously on coumadin-->patient self-d/c'd 11/2012 b/c he was tired of having INR's checked.  . Tachy-brady syndrome   . DM (diabetes mellitus)   . CAD (coronary artery disease)     DES to cx  . Pulmonary embolism 12/2003  . Deep vein thrombophlebitis of leg 2005, 2009    recurrent when off anticoag  . HTN (hypertension)   . Allergic rhinitis   . Asthma   . CKD (chronic kidney disease), stage III   . Nasal polyp     DISCHARGE MEDICATIONS:   Medication List         albuterol 108 (90 BASE) MCG/ACT inhaler  Commonly known as:  PROVENTIL HFA;VENTOLIN HFA  Inhale 2 puffs into the lungs every 4 (four) hours as needed for wheezing or shortness of breath.     budesonide 0.25 MG/2ML nebulizer solution  Commonly known as:  PULMICORT  Take 0.25 mg by nebulization every 6 (six) hours as needed (shortness of breath). Use two times a day     clonazePAM 0.5 MG tablet  Commonly known as:   KLONOPIN  Take 0.5 mg by mouth at bedtime as needed (for sleep).     diltiazem 360 MG 24 hr capsule  Commonly known as:  CARDIZEM CD  Take 1 capsule (360 mg total) by mouth daily.     doxycycline 100 MG tablet  Commonly known as:  VIBRA-TABS  Take 1 tablet (100 mg total) by mouth every 12 (twelve) hours.     furosemide 40 MG tablet  Commonly known as:  LASIX  Take 1 tablet (40 mg total) by mouth daily.     glipiZIDE 5 MG tablet  Commonly known as:  GLUCOTROL  Take 1.5 tablets (7.5 mg total) by mouth daily before breakfast.     glucose blood test strip  Commonly known as:  ONE TOUCH ULTRA TEST  - Use to check blood sugars before breakfast  - Dx 250.00     guaiFENesin 600 MG 12 hr tablet  Commonly known as:  MUCINEX  Take 1 tablet (600 mg total) by mouth 2 (two) times daily.     HYDROcodone-acetaminophen 5-325 MG per tablet  Commonly known as:  NORCO/VICODIN  Take 0.5-1 tablets by mouth every 8 (eight) hours as needed. prn     ipratropium 0.02 % nebulizer solution  Commonly known as:  ATROVENT  Take 0.5 mg by nebulization 4 (four) times daily - after meals and at bedtime.     levalbuterol 0.63 MG/3ML nebulizer solution  Commonly known as:  Penne Lash  Take 3 mLs (0.63 mg total) by nebulization every 6 (six) hours as needed for wheezing or shortness of breath.     metoprolol tartrate 25 MG tablet  Commonly known as:  LOPRESSOR  Take 1 tablet (25 mg total) by mouth 2 (two) times daily.     nitroGLYCERIN 0.3 MG SL tablet  Commonly known as:  NITROSTAT  Place 0.3 mg under the tongue every 5 (five) minutes as needed for chest pain.     ONE TOUCH LANCETS Misc  - Use to help check blood sugars before breakfast  - Dx 250.00     ONE TOUCH ULTRA 2 W/DEVICE Kit  - Use to check blood sugars daily  - Dx 250.00     PredniSONE 5 MG Tbec  - Take 8 tablets (40 mg) daily for 3 days, then,   - Take 6 tablets (30 mg) daily for 3 days, then,   - Take 4 tablets (20 mg) daily for  3 days, then,   - Take 2 tablets (10 mg) daily for 3 days, then,   - Then continue 1 tablets (5 mg) daily till seen by PCP     promethazine-codeine 6.25-10 MG/5ML syrup  Commonly known as:  PHENERGAN with CODEINE  TAKE 1 TEASPOONFUL BY MOUTH 4 TIMES A DAY as needed     QC DAILY MULTIVITAMINS/IRON Tabs  Take 1 tablet by mouth daily.     Rivaroxaban 15 MG Tabs tablet  Commonly known as:  XARELTO  Take 1 tablet (15 mg total) by mouth daily with supper.        ALLERGIES:   Allergies  Allergen Reactions  . Asa Buff (Mag [Buffered Aspirin] Nausea Only  . Ibuprofen     REACTION: causes nausea and vomitting    BRIEF HPI:  See H&P, Labs, Consult and Test reports for all details in brief, [name] is a 80 y.o. male with pmh significant for HTN, DM, tachybrady syndrome, PE, asthma, GERD and CKD stage 3; came to ED after experiencing at mechanical fall at home. No fractures appreciated on x-ray/CT in ED work up. Patient found to be tachypneic and mildly hypoxic on RA. He reports slightly productive cough and increase SOB for the last 2-3 days. X-ray with atelectasis/bronchittic changes, but not vascular congestion or frank infiltrates.   CONSULTATIONS:   None  PERTINENT RADIOLOGIC STUDIES: Ct Abdomen Pelvis Wo Contrast  10/19/2013   CLINICAL DATA:  Fall. Patient on several toe. Right chest wall pain.  EXAM: CT CHEST, ABDOMEN AND PELVIS WITHOUT CONTRAST  TECHNIQUE: Multidetector CT imaging of the chest, abdomen and pelvis was performed following the standard protocol without IV contrast.  COMPARISON:  Abdominal pelvic CT 02/05/2013 and 09/05/2008  FINDINGS: CT CHEST FINDINGS  Lungs are well inflated without consolidation or effusion. There is subtle patchy peripheral increased interstitial markings in a few small bilateral peripheral nodular opacities with the largest measuring 4 mm over the left lower lobe.  Left-sided pacemaker is present. Heart is normal in size. There is  calcification in the region of the mitral valve anulus. There is calcified plaque over the left anterior descending and lateral circumflex coronary arteries. There is moderate calcified plaque over the thoracic aorta. There is no significant mediastinal, hilar or axillary adenopathy. No acute fracture.  CT ABDOMEN AND PELVIS FINDINGS  Single 1.7 cm gallstone. The liver, spleen, pancreas and adrenal glands are within normal. Kidneys are normal in size without hydronephrosis or nephrolithiasis. There is no free peritoneal air or  free fluid. There is moderate calcified plaque involving the abdominal aorta and iliac arteries. There is moderate diverticulosis of the colon. Appendix is normal.  Pelvic images demonstrate a small left inguinal hernia containing only peritoneal fat. Bladder, prostate and rectum are unremarkable. There are moderate degenerative changes of the spine and hips. There is no acute fracture identified.  IMPRESSION: No acute findings in the chest, abdomen or pelvis.  Minimal patchy increased interstitial markings with a few small peripheral bilateral pulmonary nodules with the largest measuring 4 mm over the left lower lobe. Recommend follow-up noncontrast chest CT 1 year.  This recommendation follows the consensus statement: Guidelines for Management of Small Pulmonary Nodules Detected on CT Scans: A Statement from the [place] as published in Radiology 2005; 237:395-400. Online at: [email or web address].[email or web address]/.  Cholelithiasis.  Atherosclerotic coronary artery disease.  Moderate diverticulosis of the colon.   Electronically Signed   By: Marin Olp M.D.   On: 10/19/2013 08:36   Ct Head Wo Contrast  10/19/2013   CLINICAL DATA:  History of trauma from a fall. Right-sided neck pain. Patient is on blood thinners.  EXAM: CT HEAD WITHOUT CONTRAST  CT CERVICAL SPINE WITHOUT CONTRAST  TECHNIQUE: Multidetector CT imaging of the head and cervical spine was performed  following the standard protocol without intravenous contrast. Multiplanar CT image reconstructions of the cervical spine were also generated.  COMPARISON:  Head CT 04/22/2013.  Cervical spine CT 04/22/2013.  FINDINGS: CT HEAD FINDINGS  Multiple tiny well-defined foci of low attenuation in the basal ganglia bilaterally, compatible with old lacunar infarcts. Patchy and confluent areas of decreased attenuation are noted throughout the deep and periventricular white matter of the cerebral hemispheres bilaterally, compatible with chronic microvascular ischemic disease. Mild cerebral atrophy. No acute displaced skull fractures are identified. No acute intracranial abnormality. Specifically, no evidence of acute post-traumatic intracranial hemorrhage, no definite regions of acute/subacute cerebral ischemia, no focal mass, mass effect, hydrocephalus or abnormal intra or extra-axial fluid collections. The visualized paranasal sinuses and mastoids are well pneumatized. Posterior aspect of the left mastoid is sclerotic (unchanged), potentially related to remote infection. Mastoids are otherwise well pneumatized bilaterally. Extensive multifocal mucoperiosteal thickening throughout the paranasal sinuses, with complete opacification of the frontal sinuses bilaterally and the majority of the ethmoid sinuses bilaterally, indicative of longstanding chronic sinusitis. Postoperative changes of bilateral maxillary antrectomy are noted.  CT CERVICAL SPINE FINDINGS  No acute displaced fracture of the cervical spine. Alignment is anatomic. Prevertebral soft tissues are normal. Multilevel degenerative disc disease throughout the cervical spine, with multilevel facet arthropathy. Multiple prominent anterior vertebral body osteophytes, most evident at C4-C5 and C6-C7. Visualized portions of the upper thorax are unremarkable.  IMPRESSION: 1. No evidence of significant acute traumatic injury to the skull, brain or cervical spine. 2. Mild  cerebral atrophy with extensive chronic microvascular ischemic changes in the cerebral white matter bilaterally, and multiple old lacunar infarcts in the basal ganglia bilaterally. 3. Mild multilevel degenerative disc disease and cervical spondylosis, as above. 4. Extensive chronic paranasal sinus disease, as above.   Electronically Signed   By: Vinnie Langton M.D.   On: 10/19/2013 08:29   Ct Chest Wo Contrast  10/19/2013   CLINICAL DATA:  Fall. Patient on several toe. Right chest wall pain.  EXAM: CT CHEST, ABDOMEN AND PELVIS WITHOUT CONTRAST  TECHNIQUE: Multidetector CT imaging of the chest, abdomen and pelvis was performed following the standard protocol without IV contrast.  COMPARISON:  Abdominal pelvic CT 02/05/2013 and 09/05/2008  FINDINGS: CT  CHEST FINDINGS  Lungs are well inflated without consolidation or effusion. There is subtle patchy peripheral increased interstitial markings in a few small bilateral peripheral nodular opacities with the largest measuring 4 mm over the left lower lobe.  Left-sided pacemaker is present. Heart is normal in size. There is calcification in the region of the mitral valve anulus. There is calcified plaque over the left anterior descending and lateral circumflex coronary arteries. There is moderate calcified plaque over the thoracic aorta. There is no significant mediastinal, hilar or axillary adenopathy. No acute fracture.  CT ABDOMEN AND PELVIS FINDINGS  Single 1.7 cm gallstone. The liver, spleen, pancreas and adrenal glands are within normal. Kidneys are normal in size without hydronephrosis or nephrolithiasis. There is no free peritoneal air or free fluid. There is moderate calcified plaque involving the abdominal aorta and iliac arteries. There is moderate diverticulosis of the colon. Appendix is normal.  Pelvic images demonstrate a small left inguinal hernia containing only peritoneal fat. Bladder, prostate and rectum are unremarkable. There are moderate degenerative  changes of the spine and hips. There is no acute fracture identified.  IMPRESSION: No acute findings in the chest, abdomen or pelvis.  Minimal patchy increased interstitial markings with a few small peripheral bilateral pulmonary nodules with the largest measuring 4 mm over the left lower lobe. Recommend follow-up noncontrast chest CT 1 year.  This recommendation follows the consensus statement: Guidelines for Management of Small Pulmonary Nodules Detected on CT Scans: A Statement from the [place] as published in Radiology 2005; 237:395-400. Online at: [email or web address].[email or web address]/.  Cholelithiasis.  Atherosclerotic coronary artery disease.  Moderate diverticulosis of the colon.   Electronically Signed   By: Marin Olp M.D.   On: 10/19/2013 08:36   Ct Cervical Spine Wo Contrast  10/19/2013   CLINICAL DATA:  History of trauma from a fall. Right-sided neck pain. Patient is on blood thinners.  EXAM: CT HEAD WITHOUT CONTRAST  CT CERVICAL SPINE WITHOUT CONTRAST  TECHNIQUE: Multidetector CT imaging of the head and cervical spine was performed following the standard protocol without intravenous contrast. Multiplanar CT image reconstructions of the cervical spine were also generated.  COMPARISON:  Head CT 04/22/2013.  Cervical spine CT 04/22/2013.  FINDINGS: CT HEAD FINDINGS  Multiple tiny well-defined foci of low attenuation in the basal ganglia bilaterally, compatible with old lacunar infarcts. Patchy and confluent areas of decreased attenuation are noted throughout the deep and periventricular white matter of the cerebral hemispheres bilaterally, compatible with chronic microvascular ischemic disease. Mild cerebral atrophy. No acute displaced skull fractures are identified. No acute intracranial abnormality. Specifically, no evidence of acute post-traumatic intracranial hemorrhage, no definite regions of acute/subacute cerebral ischemia, no focal mass, mass effect, hydrocephalus  or abnormal intra or extra-axial fluid collections. The visualized paranasal sinuses and mastoids are well pneumatized. Posterior aspect of the left mastoid is sclerotic (unchanged), potentially related to remote infection. Mastoids are otherwise well pneumatized bilaterally. Extensive multifocal mucoperiosteal thickening throughout the paranasal sinuses, with complete opacification of the frontal sinuses bilaterally and the majority of the ethmoid sinuses bilaterally, indicative of longstanding chronic sinusitis. Postoperative changes of bilateral maxillary antrectomy are noted.  CT CERVICAL SPINE FINDINGS  No acute displaced fracture of the cervical spine. Alignment is anatomic. Prevertebral soft tissues are normal. Multilevel degenerative disc disease throughout the cervical spine, with multilevel facet arthropathy. Multiple prominent anterior vertebral body osteophytes, most evident at C4-C5 and C6-C7. Visualized portions of the upper thorax are unremarkable.  IMPRESSION: 1. No evidence of significant acute traumatic  injury to the skull, brain or cervical spine. 2. Mild cerebral atrophy with extensive chronic microvascular ischemic changes in the cerebral white matter bilaterally, and multiple old lacunar infarcts in the basal ganglia bilaterally. 3. Mild multilevel degenerative disc disease and cervical spondylosis, as above. 4. Extensive chronic paranasal sinus disease, as above.   Electronically Signed   By: Vinnie Langton M.D.   On: 10/19/2013 08:29   Dg Chest Portable 1 View  10/19/2013   CLINICAL DATA:  History of trauma from a fall.  EXAM: PORTABLE CHEST - 1 VIEW  COMPARISON:  Chest x-ray 02/05/2013.  FINDINGS: Lung volumes are low. Probable subsegmental atelectasis and/or scarring in the left lower lobe. Trace left pleural effusion. No pneumothorax. No consolidative airspace disease. Heart size appears borderline enlarged. Upper mediastinal contours are within normal limits. Atherosclerosis in the  thoracic aorta. Left-sided pacemaker device in position with lead tips projecting over the expected location of the right atrium and right ventricular outflow tract. Visualized bony thorax is grossly intact.  IMPRESSION: 1. Small left pleural effusion with mild subsegmental atelectasis and/or scarring in the left lung base. 2. No definite acute displaced rib fractures.  No pneumothorax. 3. Atherosclerosis.   Electronically Signed   By: Vinnie Langton M.D.   On: 10/19/2013 08:13     PERTINENT LAB RESULTS: CBC:  Recent Labs  10/19/13 0915 10/20/13 0619  WBC 16.7* 18.8*  HGB 14.1 13.7  HCT 43.4 42.5  PLT 195 174   CMET CMP     Component Value Date/Time   NA 138 10/20/2013 0619   K 5.0 10/20/2013 0619   CL 97 10/20/2013 0619   CO2 24 10/20/2013 0619   GLUCOSE 396* 10/20/2013 0619   BUN 31* 10/20/2013 0619   CREATININE 1.68* 10/20/2013 0619   CALCIUM 9.5 10/20/2013 0619   PROT 6.2 10/20/2013 0619   ALBUMIN 3.2* 10/20/2013 0619   AST 17 10/20/2013 0619   ALT 12 10/20/2013 0619   ALKPHOS 87 10/20/2013 0619   BILITOT 0.6 10/20/2013 0619   GFRNONAA 34* 10/20/2013 0619   GFRAA 40* 10/20/2013 0619    GFR Estimated Creatinine Clearance: 27.2 ml/min (by C-G formula based on Cr of 1.68). No results found for this basename: LIPASE, AMYLASE,  in the last 72 hours No results found for this basename: CKTOTAL, CKMB, CKMBINDEX, TROPONINI,  in the last 72 hours No components found with this basename: POCBNP,  No results found for this basename: DDIMER,  in the last 72 hours  Recent Labs  10/19/13 1710  HGBA1C 9.3*   No results found for this basename: CHOL, HDL, LDLCALC, TRIG, CHOLHDL, LDLDIRECT,  in the last 72 hours  Recent Labs  10/19/13 1710  TSH 1.720   No results found for this basename: VITAMINB12, FOLATE, FERRITIN, TIBC, IRON, RETICCTPCT,  in the last 72 hours Coags: No results found for this basename: PT, INR,  in the last 72 hours Microbiology: No results found for this or any previous visit  (from the past 240 hour(s)).   BRIEF HOSPITAL COURSE:   Principal Problem:   Acute respiratory failure with hypoxia -resolved, secondary to Asthma with acute exacerbation -lungs clear this am, ambulated with PT in the hallway  Active Problems: Asthma Exacerbation -came to ED for evaluation of fall, found to be wheezing, admitted and started on Nebs, steroids-now significantly improved. Lungs clear, ambulated in the hallway by PT, since significantly improved,stable for discharge today. Will transition to Prednisone and taper quickly back t 5 mg dose.  Fall: - mechanical in nature.  -no acute fractures  -continue PRN pain meds  -PT for evaluation recommending HHPT    DM (diabetes mellitus), type 2 with renal complications -CBG's elevated, but secondary to steroids, resume Glipizide on discharge    HYPERTENSION -stable, continue cardizem and Metoprolol    Embolism, pulmonary with infarction, hx of -c/w with Xarelto-may need to revisit long term anticoagulation if falls become recurrent    Atrial fibrillation -mild tachycardia-secondary to nebs-will continue with Metoprolol and Cardizem on discharge. Continue Xarelto.May need to revisit whether to continue with Xarelto if falls become recurrent.    Chronic renal disease, stage 3, moderately decreased glomerular filtration rate between 30-59 mL/min/1.73 square meter - at baseline.  Dementia -remains pleasantly confused-suspect mental status will improve once back to familiar surroundings.  TODAY-DAY OF DISCHARGE:  Subjective:   [name] today has no headache,no chest abdominal pain,no new weakness tingling or numbness, feels much better wants to go home today.   Objective:   Blood pressure 122/78, pulse 133, temperature 97.6 F (36.4 C), temperature source Oral, resp. rate 20, height 5' 6.93" (1.7 m), weight 74.8 kg (164 lb 14.5 oz), SpO2 90.00%.  Intake/Output Summary (Last 24 hours) at 10/20/13 1057 Last data  filed at 10/19/13 2203  Gross per 24 hour  Intake    120 ml  Output      4 ml  Net    116 ml   Filed Weights   10/19/13 1442 10/20/13 0432  Weight: 74.8 kg (164 lb 14.5 oz) 74.8 kg (164 lb 14.5 oz)    Exam Awake Alert,pleasantly confused, No new F.N deficits, Normal affect [place].AT,PERRAL Supple Neck,No JVD, No cervical lymphadenopathy appriciated.  Symmetrical Chest wall movement, Good air movement bilaterally, CTAB RRR,No Gallops,Rubs or new Murmurs, No Parasternal Heave +ve B.Sounds, Abd Soft, Non tender, No organomegaly appriciated, No rebound -guarding or rigidity. No Cyanosis, Clubbing or edema, No new Rash or bruise  DISCHARGE CONDITION: Stable  DISPOSITION: Home with home health services  DISCHARGE INSTRUCTIONS:    Activity:  As tolerated with Full fall precautions use walker/cane & assistance as needed  Diet recommendation: Diabetic Diet Heart Healthy diet      Discharge Instructions   Call MD for:  difficulty breathing, headache or visual disturbances    Complete by:  As directed      Call MD for:  persistant dizziness or light-headedness    Complete by:  As directed      Diet - low sodium heart healthy    Complete by:  As directed      Diet Carb Modified    Complete by:  As directed      Increase activity slowly    Complete by:  As directed            Follow-up Information   Follow up with [name], MD. Schedule an appointment as soon as possible for a visit in 1 week.   Specialty:  Internal Medicine   Contact information:   [address] [place] [address] [phone]       Total Time spent on discharge equals 45 minutes.  Signed: Jonetta Osgood 10/20/2013 10:57 AM  **Disclaimer: This note may have been dictated with voice recognition software. Similar sounding words can inadvertently be transcribed and this note may contain transcription errors which may not have been corrected upon publication of  note.**

## 2013-10-21 ENCOUNTER — Encounter: Payer: Self-pay | Admitting: Cardiology

## 2013-10-23 ENCOUNTER — Encounter: Payer: Self-pay | Admitting: Internal Medicine

## 2013-10-24 ENCOUNTER — Ambulatory Visit: Payer: Medicare Other | Admitting: Internal Medicine

## 2013-10-24 ENCOUNTER — Emergency Department (HOSPITAL_COMMUNITY): Payer: Medicare Other

## 2013-10-24 ENCOUNTER — Inpatient Hospital Stay (HOSPITAL_COMMUNITY)
Admission: EM | Admit: 2013-10-24 | Discharge: 2013-11-06 | DRG: 444 | Disposition: A | Discharge status: Skilled Nursing Facility | Payer: Medicare Other | Attending: Internal Medicine | Admitting: Internal Medicine

## 2013-10-24 ENCOUNTER — Encounter (HOSPITAL_COMMUNITY): Payer: Self-pay | Admitting: Emergency Medicine

## 2013-10-24 DIAGNOSIS — R339 Retention of urine, unspecified: Secondary | ICD-10-CM | POA: Diagnosis not present

## 2013-10-24 DIAGNOSIS — I5042 Chronic combined systolic (congestive) and diastolic (congestive) heart failure: Secondary | ICD-10-CM | POA: Diagnosis present

## 2013-10-24 DIAGNOSIS — Z86718 Personal history of other venous thrombosis and embolism: Secondary | ICD-10-CM

## 2013-10-24 DIAGNOSIS — M549 Dorsalgia, unspecified: Secondary | ICD-10-CM

## 2013-10-24 DIAGNOSIS — G9340 Encephalopathy, unspecified: Secondary | ICD-10-CM

## 2013-10-24 DIAGNOSIS — IMO0002 Reserved for concepts with insufficient information to code with codable children: Secondary | ICD-10-CM

## 2013-10-24 DIAGNOSIS — E785 Hyperlipidemia, unspecified: Secondary | ICD-10-CM | POA: Diagnosis present

## 2013-10-24 DIAGNOSIS — K8020 Calculus of gallbladder without cholecystitis without obstruction: Principal | ICD-10-CM | POA: Diagnosis present

## 2013-10-24 DIAGNOSIS — I10 Essential (primary) hypertension: Secondary | ICD-10-CM | POA: Diagnosis present

## 2013-10-24 DIAGNOSIS — J9601 Acute respiratory failure with hypoxia: Secondary | ICD-10-CM

## 2013-10-24 DIAGNOSIS — I2510 Atherosclerotic heart disease of native coronary artery without angina pectoris: Secondary | ICD-10-CM | POA: Diagnosis present

## 2013-10-24 DIAGNOSIS — R319 Hematuria, unspecified: Secondary | ICD-10-CM | POA: Diagnosis not present

## 2013-10-24 DIAGNOSIS — N390 Urinary tract infection, site not specified: Secondary | ICD-10-CM | POA: Diagnosis not present

## 2013-10-24 DIAGNOSIS — J441 Chronic obstructive pulmonary disease with (acute) exacerbation: Secondary | ICD-10-CM | POA: Diagnosis present

## 2013-10-24 DIAGNOSIS — Z86711 Personal history of pulmonary embolism: Secondary | ICD-10-CM

## 2013-10-24 DIAGNOSIS — I4891 Unspecified atrial fibrillation: Secondary | ICD-10-CM

## 2013-10-24 DIAGNOSIS — I129 Hypertensive chronic kidney disease with stage 1 through stage 4 chronic kidney disease, or unspecified chronic kidney disease: Secondary | ICD-10-CM | POA: Diagnosis present

## 2013-10-24 DIAGNOSIS — J42 Unspecified chronic bronchitis: Secondary | ICD-10-CM

## 2013-10-24 DIAGNOSIS — E1165 Type 2 diabetes mellitus with hyperglycemia: Secondary | ICD-10-CM | POA: Diagnosis present

## 2013-10-24 DIAGNOSIS — R338 Other retention of urine: Secondary | ICD-10-CM

## 2013-10-24 DIAGNOSIS — Z9861 Coronary angioplasty status: Secondary | ICD-10-CM

## 2013-10-24 DIAGNOSIS — R109 Unspecified abdominal pain: Secondary | ICD-10-CM

## 2013-10-24 DIAGNOSIS — R739 Hyperglycemia, unspecified: Secondary | ICD-10-CM

## 2013-10-24 DIAGNOSIS — J9600 Acute respiratory failure, unspecified whether with hypoxia or hypercapnia: Secondary | ICD-10-CM | POA: Diagnosis present

## 2013-10-24 DIAGNOSIS — N183 Chronic kidney disease, stage 3 unspecified: Secondary | ICD-10-CM

## 2013-10-24 DIAGNOSIS — E870 Hyperosmolality and hypernatremia: Secondary | ICD-10-CM

## 2013-10-24 DIAGNOSIS — J9620 Acute and chronic respiratory failure, unspecified whether with hypoxia or hypercapnia: Secondary | ICD-10-CM

## 2013-10-24 DIAGNOSIS — K560 Paralytic ileus: Secondary | ICD-10-CM | POA: Diagnosis not present

## 2013-10-24 DIAGNOSIS — I2699 Other pulmonary embolism without acute cor pulmonale: Secondary | ICD-10-CM

## 2013-10-24 DIAGNOSIS — E118 Type 2 diabetes mellitus with unspecified complications: Secondary | ICD-10-CM

## 2013-10-24 DIAGNOSIS — E1129 Type 2 diabetes mellitus with other diabetic kidney complication: Secondary | ICD-10-CM

## 2013-10-24 DIAGNOSIS — Z79899 Other long term (current) drug therapy: Secondary | ICD-10-CM

## 2013-10-24 DIAGNOSIS — E876 Hypokalemia: Secondary | ICD-10-CM | POA: Diagnosis not present

## 2013-10-24 DIAGNOSIS — J690 Pneumonitis due to inhalation of food and vomit: Secondary | ICD-10-CM | POA: Diagnosis present

## 2013-10-24 DIAGNOSIS — Z66 Do not resuscitate: Secondary | ICD-10-CM | POA: Diagnosis present

## 2013-10-24 DIAGNOSIS — E1121 Type 2 diabetes mellitus with diabetic nephropathy: Secondary | ICD-10-CM

## 2013-10-24 DIAGNOSIS — A419 Sepsis, unspecified organism: Secondary | ICD-10-CM | POA: Diagnosis present

## 2013-10-24 DIAGNOSIS — Z950 Presence of cardiac pacemaker: Secondary | ICD-10-CM

## 2013-10-24 DIAGNOSIS — I5032 Chronic diastolic (congestive) heart failure: Secondary | ICD-10-CM

## 2013-10-24 DIAGNOSIS — I82409 Acute embolism and thrombosis of unspecified deep veins of unspecified lower extremity: Secondary | ICD-10-CM

## 2013-10-24 DIAGNOSIS — I739 Peripheral vascular disease, unspecified: Secondary | ICD-10-CM

## 2013-10-24 DIAGNOSIS — Z7901 Long term (current) use of anticoagulants: Secondary | ICD-10-CM

## 2013-10-24 DIAGNOSIS — I509 Heart failure, unspecified: Secondary | ICD-10-CM | POA: Diagnosis present

## 2013-10-24 DIAGNOSIS — Z9181 History of falling: Secondary | ICD-10-CM

## 2013-10-24 LAB — KETONES, QUALITATIVE: Acetone, Bld: NEGATIVE

## 2013-10-24 LAB — GLUCOSE, CAPILLARY
Glucose-Capillary: 273 mg/dL — ABNORMAL HIGH (ref 70–99)
Glucose-Capillary: 296 mg/dL — ABNORMAL HIGH (ref 70–99)
Glucose-Capillary: 323 mg/dL — ABNORMAL HIGH (ref 70–99)

## 2013-10-24 LAB — CBG MONITORING, ED
GLUCOSE-CAPILLARY: 299 mg/dL — AB (ref 70–99)
Glucose-Capillary: 404 mg/dL — ABNORMAL HIGH (ref 70–99)
Glucose-Capillary: 360 mg/dL — ABNORMAL HIGH (ref 70–99)

## 2013-10-24 LAB — URINALYSIS, ROUTINE W REFLEX MICROSCOPIC
Bilirubin Urine: NEGATIVE
Hgb urine dipstick: NEGATIVE
KETONES UR: 40 mg/dL — AB
LEUKOCYTES UA: NEGATIVE
Nitrite: NEGATIVE
Protein, ur: 30 mg/dL — AB
Specific Gravity, Urine: 1.028 (ref 1.005–1.030)
Urobilinogen, UA: 1 mg/dL (ref 0.0–1.0)
pH: 6 (ref 5.0–8.0)

## 2013-10-24 LAB — BASIC METABOLIC PANEL
BUN: 35 mg/dL — AB (ref 6–23)
CHLORIDE: 101 meq/L (ref 96–112)
CO2: 22 meq/L (ref 19–32)
CREATININE: 1.39 mg/dL — AB (ref 0.50–1.35)
Calcium: 9.5 mg/dL (ref 8.4–10.5)
GFR calc Af Amer: 50 mL/min — ABNORMAL LOW (ref >90)
GFR calc non Af Amer: 43 mL/min — ABNORMAL LOW (ref >90)
Glucose, Bld: 448 mg/dL — ABNORMAL HIGH (ref 70–99)
POTASSIUM: 4.6 meq/L (ref 3.7–5.3)
Sodium: 144 mEq/L (ref 137–147)

## 2013-10-24 LAB — HEPATIC FUNCTION PANEL
ALK PHOS: 92 U/L (ref 39–117)
ALT: 19 U/L (ref 0–53)
AST: 13 U/L (ref 0–37)
Albumin: 3.1 g/dL — ABNORMAL LOW (ref 3.5–5.2)
BILIRUBIN DIRECT: 0.3 mg/dL (ref 0.0–0.3)
BILIRUBIN INDIRECT: 0.5 mg/dL (ref 0.3–0.9)
BILIRUBIN TOTAL: 0.8 mg/dL (ref 0.3–1.2)
TOTAL PROTEIN: 5.5 g/dL — AB (ref 6.0–8.3)

## 2013-10-24 LAB — CBC
HEMATOCRIT: 46.6 % (ref 39.0–52.0)
HEMOGLOBIN: 14.7 g/dL (ref 13.0–17.0)
MCH: 30.6 pg (ref 26.0–34.0)
MCHC: 31.5 g/dL (ref 30.0–36.0)
MCV: 96.9 fL (ref 78.0–100.0)
Platelets: 174 10*3/uL (ref 150–400)
RBC: 4.81 MIL/uL (ref 4.22–5.81)
RDW: 15.2 % (ref 11.5–15.5)
WBC: 15.7 10*3/uL — AB (ref 4.0–10.5)

## 2013-10-24 LAB — I-STAT TROPONIN, ED: Troponin i, poc: 0 ng/mL (ref 0.00–0.08)

## 2013-10-24 LAB — URINE MICROSCOPIC-ADD ON

## 2013-10-24 LAB — LIPASE, BLOOD: Lipase: 10 U/L — ABNORMAL LOW (ref 11–59)

## 2013-10-24 LAB — MRSA PCR SCREENING: MRSA by PCR: NEGATIVE

## 2013-10-24 MED ORDER — METOPROLOL TARTRATE 25 MG PO TABS
25.0000 mg | ORAL_TABLET | Freq: Two times a day (BID) | ORAL | Status: DC
  Administered 2013-10-24 – 2013-10-25 (×3): 25 mg via ORAL
  Filled 2013-10-24 (×5): qty 1

## 2013-10-24 MED ORDER — DILTIAZEM HCL ER COATED BEADS 360 MG PO CP24
360.0000 mg | ORAL_CAPSULE | Freq: Every day | ORAL | Status: DC
  Administered 2013-10-24: 360 mg via ORAL
  Filled 2013-10-24 (×2): qty 1

## 2013-10-24 MED ORDER — INSULIN ASPART 100 UNIT/ML ~~LOC~~ SOLN
5.0000 [IU] | Freq: Once | SUBCUTANEOUS | Status: AC
  Administered 2013-10-24: 5 [IU] via SUBCUTANEOUS
  Filled 2013-10-24: qty 1

## 2013-10-24 MED ORDER — ACETAMINOPHEN 325 MG PO TABS
650.0000 mg | ORAL_TABLET | Freq: Four times a day (QID) | ORAL | Status: DC | PRN
  Filled 2013-10-24: qty 2

## 2013-10-24 MED ORDER — ONDANSETRON HCL 4 MG/2ML IJ SOLN
4.0000 mg | Freq: Four times a day (QID) | INTRAMUSCULAR | Status: DC | PRN
Start: 2013-10-24 — End: 2013-11-06

## 2013-10-24 MED ORDER — PIPERACILLIN-TAZOBACTAM 3.375 G IVPB
3.3750 g | Freq: Three times a day (TID) | INTRAVENOUS | Status: DC
  Administered 2013-10-24 – 2013-10-26 (×5): 3.375 g via INTRAVENOUS
  Filled 2013-10-24 (×8): qty 50

## 2013-10-24 MED ORDER — INSULIN ASPART 100 UNIT/ML ~~LOC~~ SOLN
0.0000 [IU] | SUBCUTANEOUS | Status: DC
  Administered 2013-10-24: 5 [IU] via SUBCUTANEOUS
  Administered 2013-10-25 (×3): 2 [IU] via SUBCUTANEOUS
  Administered 2013-10-25: 5 [IU] via SUBCUTANEOUS
  Administered 2013-10-25: 3 [IU] via SUBCUTANEOUS

## 2013-10-24 MED ORDER — SODIUM CHLORIDE 0.9 % IV BOLUS (SEPSIS): 500.0000 mL | Freq: Once | INTRAVENOUS | Status: DC

## 2013-10-24 MED ORDER — ACETAMINOPHEN 650 MG RE SUPP: 650.0000 mg | Freq: Four times a day (QID) | RECTAL | Status: DC | PRN

## 2013-10-24 MED ORDER — MORPHINE SULFATE 2 MG/ML IJ SOLN
2.0000 mg | INTRAMUSCULAR | Status: DC | PRN
  Administered 2013-10-24 – 2013-11-06 (×15): 2 mg via INTRAVENOUS
  Filled 2013-10-24 (×17): qty 1

## 2013-10-24 MED ORDER — IOHEXOL 300 MG/ML  SOLN
25.0000 mL | Freq: Once | INTRAMUSCULAR | Status: AC | PRN
  Administered 2013-10-24: 25 mL via ORAL

## 2013-10-24 MED ORDER — GUAIFENESIN ER 600 MG PO TB12
600.0000 mg | ORAL_TABLET | Freq: Two times a day (BID) | ORAL | Status: DC
  Administered 2013-10-24 – 2013-10-31 (×15): 600 mg via ORAL
  Filled 2013-10-24 (×25): qty 1

## 2013-10-24 MED ORDER — ONDANSETRON HCL 4 MG PO TABS: 4.0000 mg | ORAL_TABLET | Freq: Four times a day (QID) | ORAL | Status: DC | PRN

## 2013-10-24 MED ORDER — SODIUM CHLORIDE 0.9 % IJ SOLN
3.0000 mL | Freq: Two times a day (BID) | INTRAMUSCULAR | Status: DC
  Administered 2013-10-25 – 2013-11-04 (×16): 3 mL via INTRAVENOUS

## 2013-10-24 MED ORDER — IPRATROPIUM-ALBUTEROL 0.5-2.5 (3) MG/3ML IN SOLN
3.0000 mL | Freq: Four times a day (QID) | RESPIRATORY_TRACT | Status: DC
  Administered 2013-10-24 – 2013-10-25 (×2): 3 mL via RESPIRATORY_TRACT
  Filled 2013-10-24 (×2): qty 3

## 2013-10-24 MED ORDER — SODIUM CHLORIDE 0.9 % IV SOLN
INTRAVENOUS | Status: AC
  Administered 2013-10-24: 16:00:00 via INTRAVENOUS

## 2013-10-24 MED ORDER — BUDESONIDE 0.25 MG/2ML IN SUSP
0.2500 mg | Freq: Four times a day (QID) | RESPIRATORY_TRACT | Status: DC | PRN
  Administered 2013-10-25: 0.25 mg via RESPIRATORY_TRACT
  Filled 2013-10-24: qty 2

## 2013-10-24 MED ORDER — FENTANYL CITRATE 0.05 MG/ML IJ SOLN
50.0000 ug | Freq: Once | INTRAMUSCULAR | Status: AC
  Administered 2013-10-24: 50 ug via INTRAVENOUS
  Filled 2013-10-24: qty 2

## 2013-10-24 MED ORDER — SODIUM CHLORIDE 0.9 % IV BOLUS (SEPSIS)
500.0000 mL | Freq: Once | INTRAVENOUS | Status: AC
  Administered 2013-10-24: 500 mL via INTRAVENOUS

## 2013-10-24 MED ORDER — PIPERACILLIN-TAZOBACTAM 3.375 G IVPB 30 MIN
3.3750 g | Freq: Once | INTRAVENOUS | Status: AC
  Administered 2013-10-24: 3.375 g via INTRAVENOUS
  Filled 2013-10-24: qty 50

## 2013-10-24 MED ORDER — HEPARIN SODIUM (PORCINE) 5000 UNIT/ML IJ SOLN
5000.0000 [IU] | Freq: Three times a day (TID) | INTRAMUSCULAR | Status: DC
  Administered 2013-10-24 – 2013-10-25 (×2): 5000 [IU] via SUBCUTANEOUS
  Filled 2013-10-24 (×5): qty 1

## 2013-10-24 MED ORDER — IOHEXOL 300 MG/ML  SOLN
80.0000 mL | Freq: Once | INTRAMUSCULAR | Status: AC | PRN
  Administered 2013-10-24: 80 mL via INTRAVENOUS

## 2013-10-24 MED ORDER — CLONAZEPAM 0.5 MG PO TABS: 0.5000 mg | ORAL_TABLET | Freq: Every evening | ORAL | Status: DC | PRN

## 2013-10-24 MED ORDER — LEVALBUTEROL HCL 1.25 MG/3ML IN NEBU
1.2500 mg | INHALATION_SOLUTION | Freq: Once | RESPIRATORY_TRACT | Status: AC
  Administered 2013-10-24: 1.25 mg via RESPIRATORY_TRACT
  Filled 2013-10-24: qty 3

## 2013-10-24 MED ORDER — DILTIAZEM HCL 100 MG IV SOLR
5.0000 mg/h | INTRAVENOUS | Status: DC
  Administered 2013-10-24: 15 mg/h via INTRAVENOUS
  Filled 2013-10-24: qty 100

## 2013-10-24 MED ORDER — DILTIAZEM HCL 100 MG IV SOLR
5.0000 mg/h | INTRAVENOUS | Status: DC
  Administered 2013-10-24: 5 mg/h via INTRAVENOUS
  Filled 2013-10-24: qty 100

## 2013-10-24 MED ORDER — DILTIAZEM LOAD VIA INFUSION
10.0000 mg | Freq: Once | INTRAVENOUS | Status: AC
  Administered 2013-10-24: 10 mg via INTRAVENOUS
  Filled 2013-10-24: qty 10

## 2013-10-24 NOTE — H&P (Signed)
[facility] History and Physical  MILDRED BOLLARD PYK:[id number] DOB: Apr 19, 1921 DOA: 10/24/2013  Referring physician:  PCP: [name], MD  Specialists:   Chief Complaint: abdominal, and back pain  HPI: [name] is a 80 y.o. male with PMH of HTN, HPL, DM, CAD, COPD, CKD, A fib on AC, frequent fall presented with lower R sided abdominal, since last night. Patient states he has been nauseated but has not vomited;  -during ED work up found to have a fib RVR styarted on IV cardizem, hopitalisist called for admission; Pt also reports productive cough, with yellow green sputum, wheezing, SOB some fever, chills; no chest pain;   Review of Systems: The patient denies anorexia, fever, weight loss,, vision loss, decreased hearing, hoarseness, chest pain, syncope, dyspnea on exertion, peripheral edema, balance deficits, hemoptysis, abdominal pain, melena, hematochezia, severe indigestion/heartburn, hematuria, incontinence, genital sores, muscle weakness, suspicious skin lesions, transient blindness, difficulty walking, depression, unusual weight change, abnormal bleeding, enlarged lymph nodes, angioedema, and breast masses.   Past Medical History  Diagnosis Date  . Presence of permanent cardiac pacemaker 04/2006 upgrade    a. s/p MDT Adapta [id number] dual chamber PPM, ser #: [id number] H.  [name] PAF (paroxysmal atrial fibrillation)     a. previously on coumadin-->patient self-d/c'd 11/2012 b/c he was tired of having INR's checked.  . Tachy-brady syndrome   . DM (diabetes mellitus)   . CAD (coronary artery disease)     DES to cx  . Pulmonary embolism 12/2003  . Deep vein thrombophlebitis of leg 2005, 2009    recurrent when off anticoag  . HTN (hypertension)   . Allergic rhinitis   . Asthma   . CKD (chronic kidney disease), stage III   . Nasal polyp    Past Surgical History  Procedure Laterality Date  . Cataract/lens implants    . Nasal polyp surgery    . Coronary angioplasty  with stent placement  2008    DES to cx  . Pacemaker insertion  04/2006    Medtronic Adapta  . Cardioversion N/A 03/20/2013    Procedure: CARDIOVERSION;  Surgeon: Carlena Bjornstad, MD;  Location: [facility] ENDOSCOPY;  Service: Cardiovascular;  Laterality: N/A;   Social History:  reports that he has never smoked. He does not have any smokeless tobacco history on file. He reports that he does not drink alcohol or use illicit drugs. Home;  where does patient live--home, ALF, SNF? and with whom if at home? Yes;  Can patient participate in ADLs?  Allergies  Allergen Reactions  . Asa Buff (Mag [Buffered Aspirin] Nausea Only  . Ibuprofen     REACTION: causes nausea and vomitting    Family History  Problem Relation Age of Onset  . Diabetes Neg Hx   . Hypertension Neg Hx   . Coronary artery disease Neg Hx     (be sure to complete)  Prior to Admission medications   Medication Sig Start Date End Date Taking? Authorizing Provider  albuterol (PROVENTIL HFA;VENTOLIN HFA) 108 (90 BASE) MCG/ACT inhaler Inhale 2 puffs into the lungs every 4 (four) hours as needed for wheezing or shortness of breath. 10/20/13  Yes Shanker Kristeen Mans, MD  budesonide (PULMICORT) 0.25 MG/2ML nebulizer solution Take 0.25 mg by nebulization every 6 (six) hours as needed (shortness of breath). Use two times a day   Yes Historical Provider, MD  clonazePAM (KLONOPIN) 0.5 MG tablet Take 0.5 mg by mouth at bedtime as needed (for sleep).   Yes Historical Provider, MD  diltiazem (CARDIZEM CD) 360 MG 24 hr capsule Take 1 capsule (360 mg total) by mouth daily. 03/17/13  Yes Carlena Bjornstad, MD  furosemide (LASIX) 40 MG tablet Take 1 tablet (40 mg total) by mouth daily. 03/27/13  Yes Deboraha Sprang, MD  glipiZIDE (GLUCOTROL) 5 MG tablet Take 1.5 tablets (7.5 mg total) by mouth daily before breakfast. 05/22/13  Yes Rowe Clack, MD  guaiFENesin (MUCINEX) 600 MG 12 hr tablet Take 1 tablet (600 mg total) by mouth 2 (two) times daily. 10/20/13  Yes  Shanker Kristeen Mans, MD  HYDROcodone-acetaminophen (NORCO/VICODIN) 5-325 MG per tablet Take 0.5-1 tablets by mouth every 8 (eight) hours as needed for moderate pain.   Yes Historical Provider, MD  ipratropium (ATROVENT) 0.02 % nebulizer solution Take 0.5 mg by nebulization 4 (four) times daily - after meals and at bedtime. 02/11/13  Yes Debbe Odea, MD  levalbuterol (XOPENEX) 0.63 MG/3ML nebulizer solution Take 3 mLs (0.63 mg total) by nebulization every 6 (six) hours as needed for wheezing or shortness of breath. 10/20/13  Yes Shanker Kristeen Mans, MD  metoprolol tartrate (LOPRESSOR) 25 MG tablet Take 1 tablet (25 mg total) by mouth 2 (two) times daily. 10/20/13  Yes Shanker Kristeen Mans, MD  Multiple Vitamins-Iron (QC DAILY MULTIVITAMINS/IRON) TABS Take 1 tablet by mouth daily.     Yes Historical Provider, MD  nitroGLYCERIN (NITROSTAT) 0.3 MG SL tablet Place 0.3 mg under the tongue every 5 (five) minutes as needed for chest pain.   Yes Historical Provider, MD  Rivaroxaban (XARELTO) 15 MG TABS tablet Take 1 tablet (15 mg total) by mouth daily with supper. 03/17/13  Yes Deboraha Sprang, MD  Blood Glucose Monitoring Suppl (ONE TOUCH ULTRA 2) W/DEVICE KIT Use to check blood sugars daily Dx 250.00 05/23/13   Rowe Clack, MD  glucose blood (ONE TOUCH ULTRA TEST) test strip Use to check blood sugars before breakfast Dx 250.00 05/23/13   Rowe Clack, MD  ONE TOUCH LANCETS MISC Use to help check blood sugars before breakfast Dx 250.00 05/23/13   Rowe Clack, MD   Physical Exam: Filed Vitals:   10/24/13 1622  BP: 151/90  Pulse: 138  Temp:   Resp: 19     General:  alert  Eyes: eom-i, conjunctival injection   ENT: no oral ulcers   Neck: supple no JVD  Cardiovascular: s1,s2 tachy  Respiratory: few wheezing  Abdomen: soft, nt,distended  Skin: ecchymosis   Musculoskeletal: LE edema  Psychiatric: no hallucinations   Neurologic: CN 2-12 intact; motor 5/5 BL  Labs on Admission:   Basic Metabolic Panel:  Recent Labs Lab 10/19/13 0915 10/19/13 1710 10/20/13 0619 10/24/13 1124  NA 144  --  138 144  K 4.3  --  5.0 4.6  CL 102  --  97 101  CO2 29  --  24 22  GLUCOSE 198*  --  396* 448*  BUN 25*  --  31* 35*  CREATININE 1.52*  --  1.68* 1.39*  CALCIUM 9.3  --  9.5 9.5  MG  --  1.9  --   --    Liver Function Tests:  Recent Labs Lab 10/20/13 0619  AST 17  ALT 12  ALKPHOS 87  BILITOT 0.6  PROT 6.2  ALBUMIN 3.2*   No results found for this basename: LIPASE, AMYLASE,  in the last 168 hours No results found for this basename: AMMONIA,  in the last 168 hours CBC:  Recent Labs Lab 10/19/13  0915 10/20/13 0619 10/24/13 1124  WBC 16.7* 18.8* 15.7*  NEUTROABS 13.1*  --   --   HGB 14.1 13.7 14.7  HCT 43.4 42.5 46.6  MCV 95.2 94.7 96.9  PLT 195 174 174   Cardiac Enzymes: No results found for this basename: CKTOTAL, CKMB, CKMBINDEX, TROPONINI,  in the last 168 hours  BNP (last 3 results)  Recent Labs  02/06/13 0200 02/08/13 0630 10/19/13 0915  PROBNP 3722.0* 5645.0* 527.0*   CBG:  Recent Labs Lab 10/19/13 2202 10/20/13 0801 10/20/13 1210 10/24/13 1334 10/24/13 1452  GLUCAP 386* 365* 280* 404* 360*    Radiological Exams on Admission: Dg Chest 2 View  10/24/2013   CLINICAL DATA:  Cough.  Shortness of breath.  Chest congestion.  EXAM: CHEST  2 VIEW  COMPARISON:  CT chest 10/19/2013. Portable chest x-ray that same date. Two-view chest x-ray 9/20 08/1012, 09/05/2012.  FINDINGS: Cardiac silhouette mildly enlarged but stable. Left subclavian dual lead transvenous pacemaker unchanged and appears intact. Stable chronic eventration of the right anterior hemidiaphragm. Lungs clear. Bronchovascular markings normal. Pulmonary vascularity normal. No visible pleural effusions. No pneumothorax. Degenerative changes and DISH involving the thoracic spine. Mid thigh osseous demineralization.  IMPRESSION: Stable cardiomegaly.  No acute cardiopulmonary disease.    Electronically Signed   By: Evangeline Dakin M.D.   On: 10/24/2013 12:54   Ct Abdomen Pelvis W Contrast  10/24/2013   CLINICAL DATA:  Low back pain.  Vomiting.  EXAM: CT ABDOMEN AND PELVIS WITH CONTRAST  TECHNIQUE: Multidetector CT imaging of the abdomen and pelvis was performed using the standard protocol following bolus administration of intravenous contrast.  CONTRAST:  49m OMNIPAQUE IOHEXOL 300 MG/ML  SOLN  COMPARISON:  02/05/2013  FINDINGS: Pacer wires noted in the right side of the heart. Lung bases are clear. No effusions.  Stable gallstones within the gallbladder. Liver, spleen, pancreas, adrenals and kidneys are unremarkable. Urinary bladder is moderately distended, otherwise unremarkable. Descending colonic and sigmoid diverticulosis without active diverticulitis. Moderate stool in the right colon and transverse colon. Appendix is visualized and is normal. Stomach and small bowel decompressed, grossly unremarkable. No free fluid, free air or adenopathy.  Left inguinal hernia containing fat. Aorta and iliac vessels are heavily calcified, non aneurysmal.  Degenerative changes in the lumbar spine and hips. Diffuse osteopenia.  IMPRESSION: Left colonic diverticulosis.  No active diverticulitis.  Cholelithiasis.  Left inguinal hernia containing fat.  No acute findings in the abdomen or pelvis.   Electronically Signed   By: KRolm BaptiseM.D.   On: 10/24/2013 13:06    EKG: Independently reviewed.   Assessment/Plan Principal Problem:   Atrial fibrillation with rapid ventricular response Active Problems:   DM (diabetes mellitus), type 2 with renal complications   HYPERTENSION   CAD   COPD exacerbation  80y.o. male with PMH of HTN, HPL, DM, CAD, COPD, CKD, A fib on AC, frequent fall presented with lower R sided abdominal, productive cough, fever   1. Abdominal pain with cholelithiasis; CT aWVP:XTGGcolonic diverticulosis. No active diverticulitis.  Cholelithiasis.  -[place] 1.6 cm diameter  gallstone in gallbladder neck with significant sludge in gallbladder. No definite evidence of acute cholecystitis. -NPO, obtain LFTs, Lipase, HIDA; atx, antiemetics;   2. COPD exacerbation, chronic bronchitis; Pt having productive cough, SOB; no significant wheezing on exam  -cont bronchodilators, oxygen, atx; hold steroids with ? Cholecystitis  3. DM uncontrolled; Per his wife Pt does not take medications as prescribed; Ha1c-9.3 -start ISS, hold PO meds 4. A  fib RVR on AC; s/p PPM -started IV cardizem in ED; titrate per HR/BP;  -hold xarelto until HIDA, ? GI evaluation; if Pt Needs a procedure  5. Chronic CHF, systolic HF; JPVG(6815), :LVEF 94%, grade 2 diastolic dysfunction -clinically euvolemic; hold diuretics while on IVF; reeval in AM    None;  if consultant consulted, please document name and whether formally or informally consulted  Code Status: full (must indicate code status--if unknown or must be presumed, indicate so) Family Communication: d/w patient, his wife (indicate person spoken with, if applicable, with phone number if by telephone) Disposition Plan: pend clinical improvement  (indicate anticipated LOS)  Time spent: >35 minutes   [place], [facility] Pager [phone]  If 7PM-7AM, please contact night-coverage [email or web address] Password [facility] 10/24/2013, 4:24 PM

## 2013-10-24 NOTE — Progress Notes (Signed)
Dr. Daleen Bo made aware of patients arrival to the unit

## 2013-10-24 NOTE — ED Provider Notes (Signed)
I saw and evaluated the patient, reviewed the resident's note and I agree with the findings and plan.   EKG Interpretation   Date/Time:  Friday October 24 2013 09:58:59 EDT Ventricular Rate:  137 PR Interval:    QRS Duration: 132 QT Interval:  339 QTC Calculation: 512 R Axis:   -74 Text Interpretation:  A fib with RVR RBBB and LAFB No significant change  since last tracing Confirmed by Takera Rayl,  DO, Boyde Grieco ([id number]) on 10/24/2013  10:19:48 AM      Pt is a 80 y.o. M with history of A. fib status post pacemaker, CAD, PE and DVT, hypertension, chronic kidney disease presents to the emergency department with complaints of right-sided abdominal pain and lower back pain. Patient's wife reports that he did fall 2 days ago but he is unsure if this is because of his fall. He denies any new numbness, tingling, focal weakness, bowel or bladder incontinence. No fever. He has chronic shortness of breath this is unchanged. He always has a cough with clear sputum production and is followed by Dr. Annamaria Boots with pulmonology. He denies any chest pain. No lower extremity swelling or pain. He states he is here mostly because of his right abdominal pain and back pain. On exam, patient is neurologically intact. He is tachycardic, irregularly irregular but normotensive. He has diffuse expiratory wheezing in his tachypnea but wife reports is chronic. He is coughing up clear sputum. He is tender to palpation diffusely in his right abdomen but mostly in the right lower quadrant into the right flank, no midline spinal tenderness or step-off or deformity.  His labs show leukocytosis with left shift. Creatinine is slightly elevated but this is his baseline. He is hyperglycemic with an anion gap of 21 but this is improving with IV fluids and subcutaneous insulin. Given he is rapidly improving, do not feel he needs an insulin drip at this time. CT scan shows no acute right-sided findings. He does have left diverticulosis without signs of  diverticulitis. He has a left inguinal hernia. He does have cholelithiasis and has some right upper quadrant tenderness but his pain is mostly in his right lower abdomen. We'll discuss with medicine for admission for A. fib with RVR, hyperglycemia. RUQ [place], LFTs pending.  [place], DO 10/24/13 [phone]

## 2013-10-24 NOTE — ED Notes (Signed)
Pt coming from home with c/o of lumbar back pain.  Pt had one episode of emesis last night around midnight with associated back pain.  Pt c/o of mild SOB with EMS.  Given 138mcg Fentanyl and 465mL fluids with EMS.  Pt was 91% on room air and placed on 2L at 96%.

## 2013-10-24 NOTE — Progress Notes (Signed)
Dr. Daleen Bo made aware of order for po Cardizem in addition to Cardizem gtt. Ok to give per MD order.

## 2013-10-24 NOTE — ED Provider Notes (Signed)
CSN: [id number]     Arrival date & time 10/24/13  1696 History   First MD Initiated Contact with Patient 10/24/13 0957     Chief Complaint  Patient presents with  . Back Pain    lumbar     (Consider location/radiation/quality/duration/timing/severity/associated sxs/prior Treatment) Patient is a 80 y.o. male presenting with back pain.  Back Pain Associated symptoms: abdominal pain   Associated symptoms: no chest pain     Patient is a 80 year old male with a complex past medical history, which includes atrial fibrillation and coronary artery disease, who presents complaining of right-sided abdominal pain since last night. Patient states he has been nauseated but has not vomited. Denies diarrhea or fevers as well. He denies chest pain, but endorses mild shortness of breath. He also denies feeling as though his heart is beating fast. He does have a pacemaker, which per his wife was adjusted just last year. History is difficult to obtain due to patient being extremely hard of hearing.  Past Medical History  Diagnosis Date  . Presence of permanent cardiac pacemaker 04/2006 upgrade    a. s/p MDT Adapta ADDR01 dual chamber PPM, ser #: [id number] H.  [name] PAF (paroxysmal atrial fibrillation)     a. previously on coumadin-->patient self-d/c'd 11/2012 b/c he was tired of having INR's checked.  . Tachy-brady syndrome   . DM (diabetes mellitus)   . CAD (coronary artery disease)     DES to cx  . Pulmonary embolism 12/2003  . Deep vein thrombophlebitis of leg 2005, 2009    recurrent when off anticoag  . HTN (hypertension)   . Allergic rhinitis   . Asthma   . CKD (chronic kidney disease), stage III   . Nasal polyp    Past Surgical History  Procedure Laterality Date  . Cataract/lens implants    . Nasal polyp surgery    . Coronary angioplasty with stent placement  2008    DES to cx  . Pacemaker insertion  04/2006    Medtronic Adapta  . Cardioversion N/A 03/20/2013    Procedure: CARDIOVERSION;   Surgeon: Carlena Bjornstad, MD;  Location: [facility] ENDOSCOPY;  Service: Cardiovascular;  Laterality: N/A;   Family History  Problem Relation Age of Onset  . Diabetes Neg Hx   . Hypertension Neg Hx   . Coronary artery disease Neg Hx    History  Substance Use Topics  . Smoking status: Never Smoker   . Smokeless tobacco: Not on file     Comment: married since 1952 to wife Dub Mikes, retrired [employer]  . Alcohol Use: No    Review of Systems  Respiratory: Positive for shortness of breath.   Cardiovascular: Negative for chest pain and palpitations.  Gastrointestinal: Positive for nausea and abdominal pain. Negative for vomiting, diarrhea and blood in stool.  Musculoskeletal: Positive for back pain.      Allergies  Asa buff (mag and Ibuprofen  Home Medications   Prior to Admission medications   Medication Sig Start Date End Date Taking? Authorizing Provider  albuterol (PROVENTIL HFA;VENTOLIN HFA) 108 (90 BASE) MCG/ACT inhaler Inhale 2 puffs into the lungs every 4 (four) hours as needed for wheezing or shortness of breath. 10/20/13  Yes Shanker Kristeen Mans, MD  budesonide (PULMICORT) 0.25 MG/2ML nebulizer solution Take 0.25 mg by nebulization every 6 (six) hours as needed (shortness of breath). Use two times a day   Yes Historical Provider, MD  clonazePAM (KLONOPIN) 0.5 MG tablet Take 0.5 mg by mouth at bedtime as  needed (for sleep).   Yes Historical Provider, MD  diltiazem (CARDIZEM CD) 360 MG 24 hr capsule Take 1 capsule (360 mg total) by mouth daily. 03/17/13  Yes Carlena Bjornstad, MD  furosemide (LASIX) 40 MG tablet Take 1 tablet (40 mg total) by mouth daily. 03/27/13  Yes Deboraha Sprang, MD  glipiZIDE (GLUCOTROL) 5 MG tablet Take 1.5 tablets (7.5 mg total) by mouth daily before breakfast. 05/22/13  Yes Rowe Clack, MD  guaiFENesin (MUCINEX) 600 MG 12 hr tablet Take 1 tablet (600 mg total) by mouth 2 (two) times daily. 10/20/13  Yes Shanker Kristeen Mans, MD  HYDROcodone-acetaminophen  (NORCO/VICODIN) 5-325 MG per tablet Take 0.5-1 tablets by mouth every 8 (eight) hours as needed for moderate pain.   Yes Historical Provider, MD  ipratropium (ATROVENT) 0.02 % nebulizer solution Take 0.5 mg by nebulization 4 (four) times daily - after meals and at bedtime. 02/11/13  Yes Debbe Odea, MD  levalbuterol (XOPENEX) 0.63 MG/3ML nebulizer solution Take 3 mLs (0.63 mg total) by nebulization every 6 (six) hours as needed for wheezing or shortness of breath. 10/20/13  Yes Shanker Kristeen Mans, MD  metoprolol tartrate (LOPRESSOR) 25 MG tablet Take 1 tablet (25 mg total) by mouth 2 (two) times daily. 10/20/13  Yes Shanker Kristeen Mans, MD  Multiple Vitamins-Iron (QC DAILY MULTIVITAMINS/IRON) TABS Take 1 tablet by mouth daily.     Yes Historical Provider, MD  nitroGLYCERIN (NITROSTAT) 0.3 MG SL tablet Place 0.3 mg under the tongue every 5 (five) minutes as needed for chest pain.   Yes Historical Provider, MD  Rivaroxaban (XARELTO) 15 MG TABS tablet Take 1 tablet (15 mg total) by mouth daily with supper. 03/17/13  Yes Deboraha Sprang, MD  Blood Glucose Monitoring Suppl (ONE TOUCH ULTRA 2) W/DEVICE KIT Use to check blood sugars daily Dx 250.00 05/23/13   Rowe Clack, MD  glucose blood (ONE TOUCH ULTRA TEST) test strip Use to check blood sugars before breakfast Dx 250.00 05/23/13   Rowe Clack, MD  ONE TOUCH LANCETS MISC Use to help check blood sugars before breakfast Dx 250.00 05/23/13   Rowe Clack, MD   BP 158/83  Pulse 126  Temp(Src) 98.1 F (36.7 C) (Oral)  Resp 23  Ht _0  (1.702 m)  Wt 165 lb (74.844 kg)  BMI 25.84 kg/m2  SpO2 94% Physical Exam  Constitutional: He appears well-developed and well-nourished. No distress.  HENT:  Head: Normocephalic and atraumatic.  Mouth/Throat: Oropharynx is clear and moist.  Very poor dentition  Eyes: Pupils are equal, round, and reactive to light.  Cardiovascular:  Tachycardic, regular rhythm  Pulmonary/Chest:  Mild tachypnea, clear  breath sounds throughout  Abdominal: Soft. Bowel sounds are normal. He exhibits no mass. There is tenderness. There is no rebound and no guarding.  TTP RUQ, negative murphy's sign  Musculoskeletal: He exhibits edema.  2+ edema bilateral shins  Neurological: He is alert.  Skin: Skin is warm and dry. He is not diaphoretic.  Psychiatric: He has a normal mood and affect. His behavior is normal.    ED Course  Procedures (including critical care time) Labs Review Labs Reviewed  BASIC METABOLIC PANEL - Abnormal; Notable for the following:    Glucose, Bld 448 (*)    BUN 35 (*)    Creatinine, Ser 1.39 (*)    GFR calc non Af Amer 43 (*)    GFR calc Af Amer 50 (*)    All other components within normal limits  CBC - Abnormal; Notable for the following:    WBC 15.7 (*)    All other components within normal limits  URINALYSIS, ROUTINE W REFLEX MICROSCOPIC - Abnormal; Notable for the following:    Glucose, UA >1000 (*)    Ketones, ur 40 (*)    Protein, ur 30 (*)    All other components within normal limits  CBG MONITORING, ED - Abnormal; Notable for the following:    Glucose-Capillary 404 (*)    All other components within normal limits  CBG MONITORING, ED - Abnormal; Notable for the following:    Glucose-Capillary 360 (*)    All other components within normal limits  URINE MICROSCOPIC-ADD ON  KETONES, QUALITATIVE  Randolm Idol, ED    Imaging Review Dg Chest 2 View  10/24/2013   CLINICAL DATA:  Cough.  Shortness of breath.  Chest congestion.  EXAM: CHEST  2 VIEW  COMPARISON:  CT chest 10/19/2013. Portable chest x-ray that same date. Two-view chest x-ray 9/20 08/1012, 09/05/2012.  FINDINGS: Cardiac silhouette mildly enlarged but stable. Left subclavian dual lead transvenous pacemaker unchanged and appears intact. Stable chronic eventration of the right anterior hemidiaphragm. Lungs clear. Bronchovascular markings normal. Pulmonary vascularity normal. No visible pleural effusions. No  pneumothorax. Degenerative changes and DISH involving the thoracic spine. Mid thigh osseous demineralization.  IMPRESSION: Stable cardiomegaly.  No acute cardiopulmonary disease.   Electronically Signed   By: Evangeline Dakin M.D.   On: 10/24/2013 12:54   Ct Abdomen Pelvis W Contrast  10/24/2013   CLINICAL DATA:  Low back pain.  Vomiting.  EXAM: CT ABDOMEN AND PELVIS WITH CONTRAST  TECHNIQUE: Multidetector CT imaging of the abdomen and pelvis was performed using the standard protocol following bolus administration of intravenous contrast.  CONTRAST:  69m OMNIPAQUE IOHEXOL 300 MG/ML  SOLN  COMPARISON:  02/05/2013  FINDINGS: Pacer wires noted in the right side of the heart. Lung bases are clear. No effusions.  Stable gallstones within the gallbladder. Liver, spleen, pancreas, adrenals and kidneys are unremarkable. Urinary bladder is moderately distended, otherwise unremarkable. Descending colonic and sigmoid diverticulosis without active diverticulitis. Moderate stool in the right colon and transverse colon. Appendix is visualized and is normal. Stomach and small bowel decompressed, grossly unremarkable. No free fluid, free air or adenopathy.  Left inguinal hernia containing fat. Aorta and iliac vessels are heavily calcified, non aneurysmal.  Degenerative changes in the lumbar spine and hips. Diffuse osteopenia.  IMPRESSION: Left colonic diverticulosis.  No active diverticulitis.  Cholelithiasis.  Left inguinal hernia containing fat.  No acute findings in the abdomen or pelvis.   Electronically Signed   By: KRolm BaptiseM.D.   On: 10/24/2013 13:06     EKG Interpretation   Date/Time:  Friday October 24 2013 09:58:59 EDT Ventricular Rate:  137 PR Interval:    QRS Duration: 132 QT Interval:  339 QTC Calculation: 512 R Axis:   -74 Text Interpretation:  A fib with RVR RBBB and LAFB No significant change  since last tracing Confirmed by WARD,  DO, KRISTEN (([id number] on 10/24/2013  10:19:48 AM      MDM    Final diagnoses:  Atrial fibrillation with RVR  Cholelithiasis  Hyperglycemia  Back pain  Abdominal pain    80year old male with complex past medical history presenting with abdominal pain, found to be in A. fib with RVR. We have started a diltiazem load and drip to bring his rate down. Will obtain CT scan of abdomen and lab work to further evaluate.  Update: CT scan showing cholelithiasis without evidence of cholecystitis. Will obtain right upper quadrant ultrasound due to his RUQ pain and leukocytosis. He is also hyperglycemic. We'll start subcutaneous insulin. Patient will require admission to the hospital for management of his RVR. Will consult hospitalist for admission.  Chrisandra Netters, MD Family Medicine PGY-2   Leeanne Rio, MD 10/24/13 [phone]

## 2013-10-24 NOTE — ED Notes (Signed)
Attempted report 

## 2013-10-24 NOTE — Progress Notes (Signed)
ANTIBIOTIC CONSULT NOTE - INITIAL  Pharmacy Consult for Zosyn Indication: rule out sepsis  Allergies  Allergen Reactions  . Asa Buff (Mag [Buffered Aspirin] Nausea Only  . Ibuprofen     REACTION: causes nausea and vomitting    Patient Measurements: Height: 5\' 7"  (170.2 cm) Weight: 165 lb (74.844 kg) IBW/kg (Calculated) : 66.1 Vital Signs: Temp: 98.1 F (36.7 C) (06/12 1005) Temp src: Oral (06/12 1005) BP: 151/90 mmHg (06/12 1622) Pulse Rate: 138 (06/12 1622) Labs:  Recent Labs  10/24/13 1124  WBC 15.7*  HGB 14.7  PLT 174  CREATININE 1.39*   Estimated Creatinine Clearance: 33 ml/min (by C-G formula based on Cr of 1.39).  Microbiology: No results found for this or any previous visit (from the past 720 hour(s)).  Medical History: Past Medical History  Diagnosis Date  . Presence of permanent cardiac pacemaker 04/2006 upgrade    a. s/p MDT Adapta ADDR01 dual chamber PPM, ser #: [id number] H.  [name] PAF (paroxysmal atrial fibrillation)     a. previously on coumadin-->patient self-d/c'd 11/2012 b/c he was tired of having INR's checked.  . Tachy-brady syndrome   . DM (diabetes mellitus)   . CAD (coronary artery disease)     DES to cx  . Pulmonary embolism 12/2003  . Deep vein thrombophlebitis of leg 2005, 2009    recurrent when off anticoag  . HTN (hypertension)   . Allergic rhinitis   . Asthma   . CKD (chronic kidney disease), stage III   . Nasal polyp    Assessment: 53 YOM from home with c/o abdominal pain with cholelithiasis s/p 1 episode of emesis overnight adn leukocytosis to start IV Zosyn per pharmacy dosing. SCr 1.39 (down from 10/20/13) and estimated CrCl~30-23mL/min. Blood cultures pending.  Goal of Therapy:  Clinical resolution of infection  Plan:  Zosyn 3.375g IV now over 30 minutes, then 3.375g IV q8h - each dose over 4 hours. Follow-up renal function and cultures.   Sloan Leiter, PharmD, BCPS Clinical Pharmacist [phone] 10/24/2013,4:44 PM

## 2013-10-25 ENCOUNTER — Inpatient Hospital Stay (HOSPITAL_COMMUNITY): Payer: Medicare Other

## 2013-10-25 LAB — COMPREHENSIVE METABOLIC PANEL
ALBUMIN: 2.8 g/dL — AB (ref 3.5–5.2)
ALK PHOS: 78 U/L (ref 39–117)
ALT: 17 U/L (ref 0–53)
AST: 17 U/L (ref 0–37)
BUN: 25 mg/dL — ABNORMAL HIGH (ref 6–23)
CO2: 27 mEq/L (ref 19–32)
Calcium: 8.9 mg/dL (ref 8.4–10.5)
Chloride: 107 mEq/L (ref 96–112)
Creatinine, Ser: 1.35 mg/dL (ref 0.50–1.35)
GFR calc Af Amer: 52 mL/min — ABNORMAL LOW (ref >90)
GFR calc non Af Amer: 45 mL/min — ABNORMAL LOW (ref >90)
Glucose, Bld: 223 mg/dL — ABNORMAL HIGH (ref 70–99)
Potassium: 4 mEq/L (ref 3.7–5.3)
SODIUM: 147 meq/L (ref 137–147)
TOTAL PROTEIN: 5.5 g/dL — AB (ref 6.0–8.3)
Total Bilirubin: 1.1 mg/dL (ref 0.3–1.2)

## 2013-10-25 LAB — GLUCOSE, CAPILLARY
GLUCOSE-CAPILLARY: 185 mg/dL — AB (ref 70–99)
GLUCOSE-CAPILLARY: 200 mg/dL — AB (ref 70–99)
Glucose-Capillary: 182 mg/dL — ABNORMAL HIGH (ref 70–99)
Glucose-Capillary: 214 mg/dL — ABNORMAL HIGH (ref 70–99)
Glucose-Capillary: 171 mg/dL — ABNORMAL HIGH (ref 70–99)
Glucose-Capillary: 213 mg/dL — ABNORMAL HIGH (ref 70–99)

## 2013-10-25 LAB — CBC
HEMATOCRIT: 41.1 % (ref 39.0–52.0)
Hemoglobin: 12.7 g/dL — ABNORMAL LOW (ref 13.0–17.0)
MCH: 30 pg (ref 26.0–34.0)
MCHC: 30.9 g/dL (ref 30.0–36.0)
MCV: 97.2 fL (ref 78.0–100.0)
Platelets: 177 10*3/uL (ref 150–400)
RBC: 4.23 MIL/uL (ref 4.22–5.81)
RDW: 15.6 % — AB (ref 11.5–15.5)
WBC: 14.8 10*3/uL — ABNORMAL HIGH (ref 4.0–10.5)

## 2013-10-25 MED ORDER — IPRATROPIUM BROMIDE 0.02 % IN SOLN
0.5000 mg | Freq: Four times a day (QID) | RESPIRATORY_TRACT | Status: DC
  Administered 2013-10-25 – 2013-10-31 (×23): 0.5 mg via RESPIRATORY_TRACT
  Filled 2013-10-25 (×23): qty 2.5

## 2013-10-25 MED ORDER — LEVALBUTEROL HCL 0.63 MG/3ML IN NEBU
0.6300 mg | INHALATION_SOLUTION | Freq: Four times a day (QID) | RESPIRATORY_TRACT | Status: DC
  Administered 2013-10-25 – 2013-10-31 (×23): 0.63 mg via RESPIRATORY_TRACT
  Filled 2013-10-25 (×44): qty 3

## 2013-10-25 MED ORDER — DILTIAZEM HCL 100 MG IV SOLR
5.0000 mg/h | INTRAVENOUS | Status: AC
  Administered 2013-10-25: 5 mg/h via INTRAVENOUS
  Administered 2013-10-25: 10 mg/h via INTRAVENOUS
  Administered 2013-10-26: 15 mg/h via INTRAVENOUS
  Administered 2013-10-26: 10 mg/h via INTRAVENOUS
  Administered 2013-10-26 – 2013-10-29 (×5): 15 mg/h via INTRAVENOUS
  Filled 2013-10-25 (×12): qty 100

## 2013-10-25 MED ORDER — INSULIN ASPART 100 UNIT/ML ~~LOC~~ SOLN
0.0000 [IU] | Freq: Three times a day (TID) | SUBCUTANEOUS | Status: DC
  Administered 2013-10-26: 5 [IU] via SUBCUTANEOUS
  Administered 2013-10-26: 3 [IU] via SUBCUTANEOUS

## 2013-10-25 MED ORDER — RIVAROXABAN 15 MG PO TABS: 15.0000 mg | ORAL_TABLET | Freq: Every day | ORAL | Status: DC

## 2013-10-25 MED ORDER — LORAZEPAM 2 MG/ML IJ SOLN
0.5000 mg | Freq: Four times a day (QID) | INTRAMUSCULAR | Status: DC | PRN
  Administered 2013-10-25: 0.5 mg via INTRAVENOUS
  Administered 2013-10-28: 1 mg via INTRAVENOUS
  Filled 2013-10-25 (×2): qty 1

## 2013-10-25 MED ORDER — BUDESONIDE 0.25 MG/2ML IN SUSP
0.2500 mg | Freq: Two times a day (BID) | RESPIRATORY_TRACT | Status: DC
  Administered 2013-10-25 – 2013-11-06 (×22): 0.25 mg via RESPIRATORY_TRACT
  Filled 2013-10-25 (×29): qty 2

## 2013-10-25 MED ORDER — TECHNETIUM TC 99M MEBROFENIN IV KIT
5.0000 | PACK | Freq: Once | INTRAVENOUS | Status: AC | PRN
  Administered 2013-10-25: 5 via INTRAVENOUS

## 2013-10-25 MED ORDER — DILTIAZEM LOAD VIA INFUSION
10.0000 mg | Freq: Once | INTRAVENOUS | Status: AC
  Administered 2013-10-25: 10 mg via INTRAVENOUS
  Filled 2013-10-25: qty 10

## 2013-10-25 MED ORDER — LEVALBUTEROL HCL 0.63 MG/3ML IN NEBU: 0.6300 mg | INHALATION_SOLUTION | RESPIRATORY_TRACT | Status: DC | PRN

## 2013-10-25 MED ORDER — RIVAROXABAN 15 MG PO TABS
15.0000 mg | ORAL_TABLET | Freq: Every day | ORAL | Status: DC
  Administered 2013-10-25 – 2013-10-31 (×6): 15 mg via ORAL
  Filled 2013-10-25 (×12): qty 1

## 2013-10-25 MED ORDER — IPRATROPIUM-ALBUTEROL 0.5-2.5 (3) MG/3ML IN SOLN
3.0000 mL | Freq: Two times a day (BID) | RESPIRATORY_TRACT | Status: DC
  Administered 2013-10-25: 3 mL via RESPIRATORY_TRACT
  Filled 2013-10-25: qty 3

## 2013-10-25 NOTE — Progress Notes (Signed)
TEAM 1 - Stepdown/ICU TEAM Progress Note  [name] AJO:[id number] DOB: Jun 29, 1920 DOA: 10/24/2013 PCP: [name], MD  Admit HPI / Brief Narrative: 80 y.o. male with PMH of HTN, HLD, DM, CAD, COPD, CKD, A fib on AC, and frequent falls who presented with lower R sided abdominal pain of 12hrs duration.  Patient stated he had been nauseated but had not vomited.  During his ED work up he was found to have a RVR and therefore was started on IV cardizem.  Pt also reported productive cough, with yellow green sputum, wheezing, SOB, some fever, and chills. Of note, pt was hospitalized 6/7-6/8 after sustaining a mechanical fall at home and then being found to be suffering an "asthma exacerbation."    HPI/Subjective: Pt is alert but confused.  He is not able to provide a reliable hx.    Assessment/Plan:  Abdominal pain with cholelithiasis CT abdom unrevealing - [place] noted 1.6 cm gallstone in gallbladder neck with significant sludge in gallbladder but no definite evidence of acute cholecystitis - HIDA scan not c/w acute cholecystitis - pt no longer reports abdom sx, but is confused - follow clinically   COPD/chronic bronchitis w/ acute bronchospastic exacerbation  resp status much improved at time of exam today - follow  PAF w/ acute RVR RVR not well controlled w/ HR 150 at time of exam - cardizem gtt not hanging, despite active order for same - re-ordered gtt and informed RN  Hx of tachy/brady s/p PPM  DM uncontrolled with renal complications N4B 9.3 - SSI as needed - follow CBG   CAD s/p DES to cx  Chronic grade 2 diastolic CHF with very mild systolic CHF (EF 09%)  No evidence of signif volume overload at this time   Hx of DVT and PE anticoag to continue   HTN BP not at goal - follow trend w/ initiation of cardizem gtt  CKD stage III Baseline GFR 30-59 - crt appears to be at baseline   Code Status: FULL Family Communication: no family present at time of  exam Disposition Plan: SDU  Consultants: none  Procedures: none  Antibiotics: Zosyn 6/12 >>  DVT prophylaxis: Xarelto   Objective: Blood pressure 140/68, pulse 101, temperature 98 F (36.7 C), temperature source Oral, resp. rate 19, height 5\' 7"  (1.702 m), weight 77.5 kg (170 lb 13.7 oz), SpO2 99.00%.  Intake/Output Summary (Last 24 hours) at 10/25/13 0832 Last data filed at 10/25/13 0800  Gross per 24 hour  Intake      0 ml  Output    250 ml  Net   -250 ml   Exam: General: No acute respiratory distress - confused  Lungs: Clear to auscultation bilaterally without wheezes or crackles Cardiovascular: rapid rate at 150bpm - irreg irreg  Abdomen: Nontender, nondistended, soft, bowel sounds positive, no rebound, no ascites, no appreciable mass Extremities: No significant cyanosis, clubbing, or edema bilateral lower extremities  Data Reviewed: Basic Metabolic Panel:  Recent Labs Lab 10/19/13 0915 10/19/13 1710 10/20/13 0619 10/24/13 1124 10/25/13 0323  NA 144  --  138 144 147  K 4.3  --  5.0 4.6 4.0  CL 102  --  97 101 107  CO2 29  --  24 22 27   GLUCOSE 198*  --  396* 448* 223*  BUN 25*  --  31* 35* 25*  CREATININE 1.52*  --  1.68* 1.39* 1.35  CALCIUM 9.3  --  9.5 9.5 8.9  MG  --  1.9  --   --   --    Liver Function Tests:  Recent Labs Lab 10/20/13 0619 10/24/13 1552 10/25/13 0323  AST 17 13 17   ALT 12 19 17   ALKPHOS 87 92 78  BILITOT 0.6 0.8 1.1  PROT 6.2 5.5* 5.5*  ALBUMIN 3.2* 3.1* 2.8*    Recent Labs Lab 10/24/13 1552  LIPASE 10*   CBC:  Recent Labs Lab 10/19/13 0915 10/20/13 0619 10/24/13 1124 10/25/13 0323  WBC 16.7* 18.8* 15.7* 14.8*  NEUTROABS 13.1*  --   --   --   HGB 14.1 13.7 14.7 12.7*  HCT 43.4 42.5 46.6 41.1  MCV 95.2 94.7 96.9 97.2  PLT 195 174 174 177   Cardiac Enzymes: No results found for this basename: CKTOTAL, CKMB, CKMBINDEX, TROPONINI,  in the last 168 hours  BNP (last 3 results)  Recent Labs  02/06/13 0200  02/08/13 0630 10/19/13 0915  PROBNP 3722.0* 5645.0* 527.0*   CBG:  Recent Labs Lab 10/24/13 1748 10/24/13 1924 10/24/13 2340 10/25/13 0355 10/25/13 0758  GLUCAP 323* 273* 296* 182* 214*    Recent Results (from the past 240 hour(s))  MRSA PCR SCREENING     Status: None   Collection Time    10/24/13  5:53 PM      Result Value Ref Range Status   MRSA by PCR NEGATIVE  NEGATIVE Final   Comment:            The GeneXpert MRSA Assay (FDA     approved for NASAL specimens     only), is one component of a     comprehensive MRSA colonization     surveillance program. It is not     intended to diagnose MRSA     infection nor to guide or     monitor treatment for     MRSA infections.     Studies:  Recent x-ray studies have been reviewed in detail by the Attending Physician  Scheduled Meds:  Scheduled Meds: . diltiazem  360 mg Oral Daily  . guaiFENesin  600 mg Oral BID  . heparin  5,000 Units Subcutaneous 3 times per day  . insulin aspart  0-9 Units Subcutaneous 6 times per day  . ipratropium-albuterol  3 mL Nebulization BID  . metoprolol tartrate  25 mg Oral BID  . piperacillin-tazobactam (ZOSYN)  IV  3.375 g Intravenous 3 times per day  . sodium chloride  500 mL Intravenous Once  . sodium chloride  3 mL Intravenous Q12H    Time spent on care of this patient: 35 mins   MCCLUNG,JEFFREY T , MD   [facility] Office  [phone] Pager - Text Page per Shea Evans as per below:  On-Call/Text Page:      [email or web address]      password [email or web address]  If 7PM-7AM, please contact night-coverage [email or web address] Password [email or web address] 10/25/2013, 8:32 AM   LOS: 1 day

## 2013-10-25 NOTE — Progress Notes (Signed)
ANTICOAGULATION CONSULT NOTE - Initial Consult  Pharmacy Consult for Xarelto Indication: nonvalvular afib   Allergies  Allergen Reactions  . Asa Buff (Mag [Buffered Aspirin] Nausea Only  . Ibuprofen     REACTION: causes nausea and vomitting    Patient Measurements: Height: 5\' 7"  (170.2 cm) Weight: 170 lb 13.7 oz (77.5 kg) IBW/kg (Calculated) : 66.1  Vital Signs: Temp: 98.4 F (36.9 C) (06/13 1132) Temp src: Oral (06/13 1132) BP: 154/98 mmHg (06/13 1132) Pulse Rate: 137 (06/13 1132)  Labs:  Recent Labs  10/24/13 1124 10/25/13 0323  HGB 14.7 12.7*  HCT 46.6 41.1  PLT 174 177  CREATININE 1.39* 1.35    Estimated Creatinine Clearance: 34 ml/min (by C-G formula based on Cr of 1.35).   Medical History: Past Medical History  Diagnosis Date  . Presence of permanent cardiac pacemaker 04/2006 upgrade    a. s/p MDT Adapta ADDR01 dual chamber PPM, ser #: [id number] H.  [name] PAF (paroxysmal atrial fibrillation)     a. previously on coumadin-->patient self-d/c'd 11/2012 b/c he was tired of having INR's checked.  . Tachy-brady syndrome   . DM (diabetes mellitus)   . CAD (coronary artery disease)     DES to cx  . Pulmonary embolism 12/2003  . Deep vein thrombophlebitis of leg 2005, 2009    recurrent when off anticoag  . HTN (hypertension)   . Allergic rhinitis   . Asthma   . CKD (chronic kidney disease), stage III   . Nasal polyp     Assessment: 89 YOM from home with c/o lumbar back pain, 1 episode of emesis, and mild SOB. During ED workup, found to have afib RVR - started on IV cardizem. Patient on xarelto PTA for hx DVT/PE and Afib, initially on hold until HIDA eval.   6/12 CT BEM:LJQG colonic diverticulosis. No active diverticulitis. Cholelithiasis 6/12 [place]: 1.6 cm diameter gallstone in gallbladder neck with significant sludge in gallbladder.  6/13 HIDA - Patent cystic and common bile ducts, resume xarelto   SCr 1.35, CrCl 34  Goal of Therapy:  Monitor platelets by  anticoagulation protocol: Yes   Plan:  Resume Xarelto 15mg  daily PO qsupper, dose is appropriate. Monitor s/s bleeding   Ollen Gross B. Leitha Schuller, PharmD Clinical Pharmacist - Resident Phone: [phone] Pager: [phone] 10/25/2013 11:38 AM

## 2013-10-26 LAB — GLUCOSE, CAPILLARY: GLUCOSE-CAPILLARY: 196 mg/dL — AB (ref 70–99)

## 2013-10-26 LAB — CBC
HCT: 43.4 % (ref 39.0–52.0)
HEMOGLOBIN: 13.5 g/dL (ref 13.0–17.0)
MCH: 30.7 pg (ref 26.0–34.0)
MCHC: 31.1 g/dL (ref 30.0–36.0)
MCV: 98.6 fL (ref 78.0–100.0)
Platelets: 192 10*3/uL (ref 150–400)
RBC: 4.4 MIL/uL (ref 4.22–5.81)
RDW: 15.6 % — ABNORMAL HIGH (ref 11.5–15.5)
WBC: 19.6 10*3/uL — ABNORMAL HIGH (ref 4.0–10.5)

## 2013-10-26 LAB — COMPREHENSIVE METABOLIC PANEL
ALT: 18 U/L (ref 0–53)
AST: 23 U/L (ref 0–37)
Albumin: 3 g/dL — ABNORMAL LOW (ref 3.5–5.2)
Alkaline Phosphatase: 84 U/L (ref 39–117)
BUN: 23 mg/dL (ref 6–23)
CALCIUM: 9.4 mg/dL (ref 8.4–10.5)
CO2: 21 mEq/L (ref 19–32)
CREATININE: 1.4 mg/dL — AB (ref 0.50–1.35)
Chloride: 103 mEq/L (ref 96–112)
GFR calc non Af Amer: 43 mL/min — ABNORMAL LOW (ref >90)
GFR, EST AFRICAN AMERICAN: 49 mL/min — AB (ref >90)
Glucose, Bld: 271 mg/dL — ABNORMAL HIGH (ref 70–99)
Potassium: 3.8 mEq/L (ref 3.7–5.3)
Sodium: 149 mEq/L — ABNORMAL HIGH (ref 137–147)
Total Bilirubin: 1.3 mg/dL — ABNORMAL HIGH (ref 0.3–1.2)
Total Protein: 6.2 g/dL (ref 6.0–8.3)

## 2013-10-26 MED ORDER — INSULIN ASPART 100 UNIT/ML ~~LOC~~ SOLN
0.0000 [IU] | Freq: Three times a day (TID) | SUBCUTANEOUS | Status: DC
  Administered 2013-10-26 – 2013-10-27 (×2): 4 [IU] via SUBCUTANEOUS
  Administered 2013-10-27: 7 [IU] via SUBCUTANEOUS
  Administered 2013-10-27: 4 [IU] via SUBCUTANEOUS
  Administered 2013-10-28: 7 [IU] via SUBCUTANEOUS
  Administered 2013-10-28: 11 [IU] via SUBCUTANEOUS
  Administered 2013-10-29 (×2): 7 [IU] via SUBCUTANEOUS
  Administered 2013-10-30: 11 [IU] via SUBCUTANEOUS
  Administered 2013-10-30: 4 [IU] via SUBCUTANEOUS
  Administered 2013-10-30: 7 [IU] via SUBCUTANEOUS
  Administered 2013-10-31: 20 [IU] via SUBCUTANEOUS
  Administered 2013-10-31: 15 [IU] via SUBCUTANEOUS
  Administered 2013-11-01: 4 [IU] via SUBCUTANEOUS
  Administered 2013-11-02 (×2): 3 [IU] via SUBCUTANEOUS
  Administered 2013-11-03: 4 [IU] via SUBCUTANEOUS

## 2013-10-26 MED ORDER — METOPROLOL TARTRATE 1 MG/ML IV SOLN
10.0000 mg | Freq: Four times a day (QID) | INTRAVENOUS | Status: DC
  Administered 2013-10-26 – 2013-10-30 (×17): 10 mg via INTRAVENOUS
  Filled 2013-10-26 (×24): qty 10

## 2013-10-26 MED ORDER — SODIUM CHLORIDE 0.9 % IV SOLN: INTRAVENOUS | Status: DC

## 2013-10-26 MED ORDER — SODIUM CHLORIDE 0.9 % IV BOLUS (SEPSIS)
500.0000 mL | Freq: Once | INTRAVENOUS | Status: AC
  Administered 2013-10-26: 500 mL via INTRAVENOUS

## 2013-10-26 MED ORDER — LACTULOSE 10 GM/15ML PO SOLN
30.0000 g | Freq: Three times a day (TID) | ORAL | Status: DC
  Administered 2013-10-26: 30 g via ORAL
  Filled 2013-10-26 (×5): qty 45

## 2013-10-26 NOTE — Progress Notes (Signed)
[place] TEAM 1 - Stepdown/ICU TEAM Progress Note  [name] TIW:[id number] DOB: [birth year] DOA: 10/24/2013 PCP: [name], MD  Admit HPI / Brief Narrative: 80 y.o. male with PMH of HTN, HLD, DM, CAD, COPD, CKD, A fib on AC, and frequent falls who presented with lower R sided abdominal pain of 12hrs duration.  Patient stated he had been nauseated but had not vomited.  During his ED work up he was found to have a RVR and therefore was started on IV cardizem.  Pt also reported productive cough, with yellow green sputum, wheezing, SOB, some fever, and chills. Of note, pt was hospitalized 6/7-6/8 after sustaining a mechanical fall at home and then being found to be suffering an "asthma exacerbation."    HPI/Subjective: Pt is alert but remains confused.  He is not able to provide a reliable hx.  No family is present at the time of my exam.  RVR continues to be an issue, w/ HR into the 150 range.    Assessment/Plan:  PAF w/ acute RVR RVR remains difficult to control - add scheduled IV BB - cardizem gtt to max of 15mg  - appears mildly DH so will resuscitate - if does not improve will have to consider initiation of amio and possible Cardiology consultation   Abdominal pain with cholelithiasis CT abdom unrevealing - [place] noted 1.6 cm gallstone in gallbladder neck with significant sludge in gallbladder but no definite evidence of acute cholecystitis - HIDA scan not c/w acute cholecystitis - pt now again c/o generalized abdom pain - abdom is distended c/w illeus - no BM since admit - add lactulose and follow exam - clear liquids only for now   COPD/chronic bronchitis w/ acute bronchospastic exacerbation  resp status stable at this time   Hx of tachy/brady s/p PPM  DM uncontrolled with renal complications S5K 9.3 - CBG poorly controlled - adjust tx and follow trend   CAD s/p DES to cx  Chronic grade 2 diastolic CHF with very mild systolic CHF (EF 53%)  Pt appears to be somewhat DH -  gently hydrate and follow   Hx of DVT and PE anticoag to continue   HTN BP not at goal - follow trend w/ addition of IV BB  CKD stage III Baseline GFR 30-59 - crt appears to be at baseline   Code Status: NO CODE  Family Communication: called placed to family - spoke w/ son and wife - they confirm he has a living will and would wish to be DNR/NO CODE (as during prior hospitalizations) Disposition Plan: SDU  Consultants: none  Procedures: none  Antibiotics: Zosyn 6/12 >>6/13  DVT prophylaxis: Xarelto   Objective: Blood pressure 148/90, pulse 155, temperature 98.9 F (37.2 C), temperature source Axillary, resp. rate 24, height 5\' 7"  (1.702 m), weight 77.5 kg (170 lb 13.7 oz), SpO2 99.00%.  Intake/Output Summary (Last 24 hours) at 10/26/13 1256 Last data filed at 10/26/13 1057  Gross per 24 hour  Intake    170 ml  Output    300 ml  Net   -130 ml   Exam: General: No acute respiratory distress - confused  Lungs: Clear to auscultation bilaterally without wheezes or crackles Cardiovascular: rapid rate at 150bpm - irreg irreg - no appreciable M Abdomen: distended, tympanic, no rebound, bs +, mildly tender th/o, soft, no appreciable mass Extremities: No significant cyanosis, clubbing, or edema bilateral lower extremities  Data Reviewed: Basic Metabolic Panel:  Recent Labs Lab 10/19/13 1710 10/20/13 9767  10/24/13 1124 10/25/13 0323 10/26/13 0310  NA  --  138 144 147 149*  K  --  5.0 4.6 4.0 3.8  CL  --  97 101 107 103  CO2  --  24 22 27 21   GLUCOSE  --  396* 448* 223* 271*  BUN  --  31* 35* 25* 23  CREATININE  --  1.68* 1.39* 1.35 1.40*  CALCIUM  --  9.5 9.5 8.9 9.4  MG 1.9  --   --   --   --    Liver Function Tests:  Recent Labs Lab 10/20/13 0619 10/24/13 1552 10/25/13 0323 10/26/13 0310  AST 17 13 17 23   ALT 12 19 17 18   ALKPHOS 87 92 78 84  BILITOT 0.6 0.8 1.1 1.3*  PROT 6.2 5.5* 5.5* 6.2  ALBUMIN 3.2* 3.1* 2.8* 3.0*    Recent Labs Lab  10/24/13 1552  LIPASE 10*   CBC:  Recent Labs Lab 10/20/13 0619 10/24/13 1124 10/25/13 0323 10/26/13 0310  WBC 18.8* 15.7* 14.8* 19.6*  HGB 13.7 14.7 12.7* 13.5  HCT 42.5 46.6 41.1 43.4  MCV 94.7 96.9 97.2 98.6  PLT 174 174 177 192   Cardiac Enzymes: No results found for this basename: CKTOTAL, CKMB, CKMBINDEX, TROPONINI,  in the last 168 hours  BNP (last 3 results)  Recent Labs  02/06/13 0200 02/08/13 0630 10/19/13 0915  PROBNP 3722.0* 5645.0* 527.0*   CBG:  Recent Labs Lab 10/25/13 0758 10/25/13 1131 10/25/13 1647 10/25/13 1931 10/25/13 2134  GLUCAP 214* 171* 185* 200* 213*    Recent Results (from the past 240 hour(s))  CULTURE, BLOOD (ROUTINE X 2)     Status: None   Collection Time    10/24/13  4:50 PM      Result Value Ref Range Status   Specimen Description BLOOD RIGHT ARM   Final   Special Requests BOTTLES DRAWN AEROBIC ONLY 10CC   Final   Culture  Setup Time     Final   Value: 10/24/2013 22:28     Performed at [facility]   Culture     Final   Value:        BLOOD CULTURE RECEIVED NO GROWTH TO DATE CULTURE WILL BE HELD FOR 5 DAYS BEFORE ISSUING A FINAL NEGATIVE REPORT     Performed at [facility]   Report Status PENDING   Incomplete  CULTURE, BLOOD (ROUTINE X 2)     Status: None   Collection Time    10/24/13  5:00 PM      Result Value Ref Range Status   Specimen Description BLOOD RIGHT HAND   Final   Special Requests BOTTLES DRAWN AEROBIC AND ANAEROBIC 10CC   Final   Culture  Setup Time     Final   Value: 10/24/2013 22:26     Performed at [facility]   Culture     Final   Value:        BLOOD CULTURE RECEIVED NO GROWTH TO DATE CULTURE WILL BE HELD FOR 5 DAYS BEFORE ISSUING A FINAL NEGATIVE REPORT     Performed at [facility]   Report Status PENDING   Incomplete  MRSA PCR SCREENING     Status: None   Collection Time    10/24/13  5:53 PM      Result Value Ref Range Status   MRSA by PCR NEGATIVE  NEGATIVE  Final   Comment:  The GeneXpert MRSA Assay (FDA     approved for NASAL specimens     only), is one component of a     comprehensive MRSA colonization     surveillance program. It is not     intended to diagnose MRSA     infection nor to guide or     monitor treatment for     MRSA infections.     Studies:  Recent x-ray studies have been reviewed in detail by the Attending Physician  Scheduled Meds:  Scheduled Meds: . budesonide  0.25 mg Nebulization BID  . guaiFENesin  600 mg Oral BID  . insulin aspart  0-9 Units Subcutaneous TID WC  . ipratropium  0.5 mg Nebulization Q6H  . levalbuterol  0.63 mg Nebulization Q6H  . metoprolol  10 mg Intravenous 4 times per day  . piperacillin-tazobactam (ZOSYN)  IV  3.375 g Intravenous 3 times per day  . Rivaroxaban  15 mg Oral Q supper  . sodium chloride  3 mL Intravenous Q12H    Time spent on care of this patient: 35 mins   MCCLUNG,JEFFREY T , MD   [facility] Office  [phone] Pager - Text Page per Shea Evans as per below:  On-Call/Text Page:      [email or web address]      password [email or web address]  If 7PM-7AM, please contact night-coverage [email or web address] Password [email or web address] 10/26/2013, 12:56 PM   LOS: 2 days

## 2013-10-26 NOTE — Progress Notes (Signed)
10/26/2013 Patient heart rate at 1430 was 144-156. MD was notified and is aware, orders were given  for 500 cc Normal Saline bolus and Lopressors was changed the IV. Continue to monitor. [facility] RN.

## 2013-10-26 NOTE — Discharge Instructions (Signed)
Information on my medicine - XARELTO (Rivaroxaban)  This medication education was reviewed with me or my healthcare representative as part of my discharge preparation.  The pharmacist that spoke with me during my hospital stay was:  Burna Cash [facility]  Why was Xarelto prescribed for you? Xarelto was prescribed for you to reduce the risk of a blood clot forming that can cause a stroke if you have a medical condition called atrial fibrillation (a type of irregular heartbeat).  What do you need to know about xarelto ? Take your Xarelto ONCE DAILY at the same time every day with your evening meal. If you have difficulty swallowing the tablet whole, you may crush it and mix in applesauce just prior to taking your dose.  Take Xarelto exactly as prescribed by your doctor and DO NOT stop taking Xarelto without talking to the doctor who prescribed the medication.  Stopping without other stroke prevention medication to take the place of Xarelto may increase your risk of developing a clot that causes a stroke.  Refill your prescription before you run out.  After discharge, you should have regular check-up appointments with your healthcare provider that is prescribing your Xarelto.  In the future your dose may need to be changed if your kidney function or weight changes by a significant amount.  What do you do if you miss a dose? If you are taking Xarelto ONCE DAILY and you miss a dose, take it as soon as you remember on the same day then continue your regularly scheduled once daily regimen the next day. Do not take two doses of Xarelto at the same time or on the same day.   Important Safety Information A possible side effect of Xarelto is bleeding. You should call your healthcare provider right away if you experience any of the following:   Bleeding from an injury or your nose that does not stop.   Unusual colored urine (red or dark brown) or unusual colored stools (red or black).   Unusual  bruising for unknown reasons.   A serious fall or if you hit your head (even if there is no bleeding).  Some medicines may interact with Xarelto and might increase your risk of bleeding while on Xarelto. To help avoid this, consult your healthcare provider or pharmacist prior to using any new prescription or non-prescription medications, including herbals, vitamins, non-steroidal anti-inflammatory drugs (NSAIDs) and supplements.  This website has more information on Xarelto: [email or web address]-[email or web address]

## 2013-10-27 ENCOUNTER — Inpatient Hospital Stay (HOSPITAL_COMMUNITY): Payer: Medicare Other

## 2013-10-27 ENCOUNTER — Ambulatory Visit: Payer: Self-pay | Admitting: Podiatry

## 2013-10-27 LAB — CBC
HEMATOCRIT: 46.3 % (ref 39.0–52.0)
Hemoglobin: 14.6 g/dL (ref 13.0–17.0)
MCH: 31 pg (ref 26.0–34.0)
MCHC: 31.5 g/dL (ref 30.0–36.0)
MCV: 98.3 fL (ref 78.0–100.0)
Platelets: 169 10*3/uL (ref 150–400)
RBC: 4.71 MIL/uL (ref 4.22–5.81)
RDW: 15.6 % — ABNORMAL HIGH (ref 11.5–15.5)
WBC: 13.5 10*3/uL — ABNORMAL HIGH (ref 4.0–10.5)

## 2013-10-27 LAB — COMPREHENSIVE METABOLIC PANEL
ALT: 18 U/L (ref 0–53)
AST: 21 U/L (ref 0–37)
Albumin: 2.8 g/dL — ABNORMAL LOW (ref 3.5–5.2)
Alkaline Phosphatase: 83 U/L (ref 39–117)
BUN: 21 mg/dL (ref 6–23)
CALCIUM: 9.1 mg/dL (ref 8.4–10.5)
CO2: 20 mEq/L (ref 19–32)
Chloride: 107 mEq/L (ref 96–112)
Creatinine, Ser: 1.15 mg/dL (ref 0.50–1.35)
GFR calc Af Amer: 63 mL/min — ABNORMAL LOW (ref >90)
GFR, EST NON AFRICAN AMERICAN: 54 mL/min — AB (ref >90)
Glucose, Bld: 203 mg/dL — ABNORMAL HIGH (ref 70–99)
Potassium: 3.4 mEq/L — ABNORMAL LOW (ref 3.7–5.3)
Sodium: 149 mEq/L — ABNORMAL HIGH (ref 137–147)
Total Bilirubin: 1 mg/dL (ref 0.3–1.2)
Total Protein: 6.1 g/dL (ref 6.0–8.3)

## 2013-10-27 LAB — GLUCOSE, CAPILLARY
GLUCOSE-CAPILLARY: 228 mg/dL — AB (ref 70–99)
Glucose-Capillary: 285 mg/dL — ABNORMAL HIGH (ref 70–99)
Glucose-Capillary: 100 mg/dL — ABNORMAL HIGH (ref 70–99)
Glucose-Capillary: 207 mg/dL — ABNORMAL HIGH (ref 70–99)
Glucose-Capillary: 188 mg/dL — ABNORMAL HIGH (ref 70–99)
Glucose-Capillary: 187 mg/dL — ABNORMAL HIGH (ref 70–99)
Glucose-Capillary: 209 mg/dL — ABNORMAL HIGH (ref 70–99)

## 2013-10-27 MED ORDER — LACTULOSE 10 GM/15ML PO SOLN
30.0000 g | Freq: Two times a day (BID) | ORAL | Status: DC
  Administered 2013-10-28 – 2013-10-31 (×6): 30 g via ORAL
  Filled 2013-10-27 (×9): qty 45

## 2013-10-27 MED ORDER — DIGOXIN 0.25 MG/ML IJ SOLN
0.2500 mg | Freq: Once | INTRAMUSCULAR | Status: AC
  Administered 2013-10-27: 0.25 mg via INTRAVENOUS
  Filled 2013-10-27: qty 1

## 2013-10-27 MED ORDER — DIGOXIN 0.25 MG/ML IJ SOLN
0.1250 mg | Freq: Four times a day (QID) | INTRAMUSCULAR | Status: AC
  Administered 2013-10-27 – 2013-10-28 (×2): 0.125 mg via INTRAVENOUS
  Filled 2013-10-27 (×5): qty 0.5

## 2013-10-27 MED ORDER — RESOURCE THICKENUP CLEAR PO POWD
ORAL | Status: DC | PRN
  Administered 2013-10-28: via ORAL
  Filled 2013-10-27: qty 125

## 2013-10-27 MED ORDER — BISOPROLOL FUMARATE 10 MG PO TABS: 10.0000 mg | ORAL_TABLET | Freq: Every day | ORAL | Status: DC

## 2013-10-27 MED ORDER — PIPERACILLIN-TAZOBACTAM 3.375 G IVPB
3.3750 g | Freq: Three times a day (TID) | INTRAVENOUS | Status: DC
  Administered 2013-10-27 – 2013-11-03 (×22): 3.375 g via INTRAVENOUS
  Filled 2013-10-27 (×27): qty 50

## 2013-10-27 NOTE — Progress Notes (Addendum)
[place] TEAM 1 - Stepdown/ICU TEAM Progress Note  [name] [id number] DOB: 12-Feb-1921 DOA: 10/24/2013 PCP: [name], MD  Admit HPI / Brief Narrative: 80 y.o. male with PMH of HTN, HLD, DM, CAD, COPD, CKD, A fib on AC, and frequent falls who presented with lower R sided abdominal pain of 12hrs duration.  Patient stated he had been nauseated but had not vomited.  During his ED work up he was found to have a RVR and therefore was started on IV cardizem.  Pt also reported productive cough, with yellow green sputum, wheezing, SOB, some fever, and chills. Of note, pt was hospitalized 6/7-6/8 after sustaining a mechanical fall at home and then being found to be suffering an "asthma exacerbation."    HPI/Subjective: Pt is conversant and pleasant.  He denies cp, or sob. He states his abdom feels much better after a BM.   Assessment/Plan:  PAF w/ acute RVR RVR remains difficult to control - added scheduled IV BB 6/14 with initial success - cardizem gtt remains at max of 15mg  - no longer appears DH - old records suggest prior fear of previous lung toxicity on amio - will load w/ IV digoxin - if this does not control his rate, will Consult Cardiology to consider DCCV - pt is tolerating his RVR quite well at this time   RLL infiltrate  tx empirically for presumed aspiration pneumonitis/pneumonia  Abdominal pain with cholelithiasis CT abdom unrevealing - [place] noted 1.6 cm gallstone in gallbladder neck with significant sludge in gallbladder but no definite evidence of acute cholecystitis - HIDA scan not c/w acute cholecystitis - pt now again c/o generalized abdom pain - abdom distention has improved post BM - follow   COPD/chronic bronchitis w/ acute bronchospastic exacerbation  resp status stable at this time   Hx of tachy/brady s/p PPM  DM uncontrolled with renal complications H6W 9.3 - CBG poorly controlled but improved - follow trend w/o change today   CAD s/p DES to  cx  Chronic grade 2 diastolic CHF with very mild systolic CHF (EF 73%)  Appears to be euvolemic at this time   Hx of DVT and PE anticoag to continue   HTN BP well controlled at this time   CKD stage III Baseline GFR 30-59 - crt appears to be at baseline   Code Status: NO CODE  Family Communication: no family present at time of exam today  Disposition Plan: SDU  Consultants: none  Procedures: none  Antibiotics: Zosyn 6/12 >>6/13  DVT prophylaxis: Xarelto   Objective: Blood pressure 106/79, pulse 130, temperature 97.3 F (36.3 C), temperature source Axillary, resp. rate 28, height 5\' 7"  (1.702 m), weight 77.5 kg (170 lb 13.7 oz), SpO2 99.00%.  Intake/Output Summary (Last 24 hours) at 10/27/13 1622 Last data filed at 10/27/13 1330  Gross per 24 hour  Intake     60 ml  Output    600 ml  Net   -540 ml   Exam: General: No acute respiratory distress - remains confused  Lungs: Clear to auscultation bilaterally without wheezes or crackles Cardiovascular: rapid rate at 145bpm - irreg irreg - no appreciable M Abdomen: distended, tympanic, no rebound, bs +, mildly tender th/o, soft, no appreciable mass Extremities: No significant cyanosis, clubbing, or edema bilateral lower extremities  Data Reviewed: Basic Metabolic Panel:  Recent Labs Lab 10/24/13 1124 10/25/13 0323 10/26/13 0310 10/27/13 0334  NA 144 147 149* 149*  K 4.6 4.0 3.8 3.4*  CL 101  107 103 107  CO2 22 27 21 20   GLUCOSE 448* 223* 271* 203*  BUN 35* 25* 23 21  CREATININE 1.39* 1.35 1.40* 1.15  CALCIUM 9.5 8.9 9.4 9.1   Liver Function Tests:  Recent Labs Lab 10/24/13 1552 10/25/13 0323 10/26/13 0310 10/27/13 0334  AST 13 17 23 21   ALT 19 17 18 18   ALKPHOS 92 78 84 83  BILITOT 0.8 1.1 1.3* 1.0  PROT 5.5* 5.5* 6.2 6.1  ALBUMIN 3.1* 2.8* 3.0* 2.8*    Recent Labs Lab 10/24/13 1552  LIPASE 10*   CBC:  Recent Labs Lab 10/24/13 1124 10/25/13 0323 10/26/13 0310 10/27/13 0334  WBC  15.7* 14.8* 19.6* 13.5*  HGB 14.7 12.7* 13.5 14.6  HCT 46.6 41.1 43.4 46.3  MCV 96.9 97.2 98.6 98.3  PLT 174 177 192 169   Cardiac Enzymes: No results found for this basename: CKTOTAL, CKMB, CKMBINDEX, TROPONINI,  in the last 168 hours  BNP (last 3 results)  Recent Labs  02/06/13 0200 02/08/13 0630 10/19/13 0915  PROBNP 3722.0* 5645.0* 527.0*   CBG:  Recent Labs Lab 10/26/13 1610 10/26/13 2147 10/27/13 0750 10/27/13 1219 10/27/13 1545  GLUCAP 196* 100* 207* 188* 187*    Recent Results (from the past 240 hour(s))  CULTURE, BLOOD (ROUTINE X 2)     Status: None   Collection Time    10/24/13  4:50 PM      Result Value Ref Range Status   Specimen Description BLOOD RIGHT ARM   Final   Special Requests BOTTLES DRAWN AEROBIC ONLY 10CC   Final   Culture  Setup Time     Final   Value: 10/24/2013 22:28     Performed at [facility]   Culture     Final   Value:        BLOOD CULTURE RECEIVED NO GROWTH TO DATE CULTURE WILL BE HELD FOR 5 DAYS BEFORE ISSUING A FINAL NEGATIVE REPORT     Performed at [facility]   Report Status PENDING   Incomplete  CULTURE, BLOOD (ROUTINE X 2)     Status: None   Collection Time    10/24/13  5:00 PM      Result Value Ref Range Status   Specimen Description BLOOD RIGHT HAND   Final   Special Requests BOTTLES DRAWN AEROBIC AND ANAEROBIC 10CC   Final   Culture  Setup Time     Final   Value: 10/24/2013 22:26     Performed at [facility]   Culture     Final   Value:        BLOOD CULTURE RECEIVED NO GROWTH TO DATE CULTURE WILL BE HELD FOR 5 DAYS BEFORE ISSUING A FINAL NEGATIVE REPORT     Performed at [facility]   Report Status PENDING   Incomplete  MRSA PCR SCREENING     Status: None   Collection Time    10/24/13  5:53 PM      Result Value Ref Range Status   MRSA by PCR NEGATIVE  NEGATIVE Final   Comment:            The GeneXpert MRSA Assay (FDA     approved for NASAL specimens     only), is one component  of a     comprehensive MRSA colonization     surveillance program. It is not     intended to diagnose MRSA     infection nor to guide or  monitor treatment for     MRSA infections.     Studies:  Recent x-ray studies have been reviewed in detail by the Attending Physician  Scheduled Meds:  Scheduled Meds: . budesonide  0.25 mg Nebulization BID  . guaiFENesin  600 mg Oral BID  . insulin aspart  0-20 Units Subcutaneous TID WC  . ipratropium  0.5 mg Nebulization Q6H  . lactulose  30 g Oral TID  . levalbuterol  0.63 mg Nebulization Q6H  . metoprolol  10 mg Intravenous 4 times per day  . Rivaroxaban  15 mg Oral Q supper  . sodium chloride  3 mL Intravenous Q12H    Time spent on care of this patient: 35 mins   Arlita Buffkin T , MD   [facility] Office  [phone] Pager - Text Page per Shea Evans as per below:  On-Call/Text Page:      [email or web address]      password [email or web address]  If 7PM-7AM, please contact night-coverage [email or web address] Password [email or web address] 10/27/2013, 4:22 PM   LOS: 3 days

## 2013-10-27 NOTE — Care Management Note (Signed)
    Page 1 of 1   10/27/2013     10:05:42 AM CARE MANAGEMENT NOTE 10/27/2013  Patient:  [name], [name]   Account Number:  [id number]  Date Initiated:  10/27/2013  Documentation initiated by:  Emeterio Balke  Subjective/Objective Assessment:   dx AFib w/RVR; lives with spouse    PCP  Dr [name]     DC Planning Services  CM consult      Per UR Regulation:  Reviewed for med. necessity/level of care/duration of stay  Comments:  Contact:    Carmichael Burdette, spouse [phone]

## 2013-10-27 NOTE — Progress Notes (Addendum)
Speech Language Pathology   Patient Details Name: [name] MRN: [id number] DOB: 1920/08/04 Today's Date: 10/27/2013 Time:  -      Chart review revealed history of dysphagia with aspiration.  SLP recommends MBS to assess present status of oropharyngeal and determine if head positions or modification of volume size mitigates pt.'s risk without thickening liquids.  MBS scheduled today at 10:30.  [name] [place].Ed [employer] [phone]  10/27/2013

## 2013-10-27 NOTE — Progress Notes (Signed)
Speech Language Pathology    Patient Details Name: [name] MRN: [id number] DOB: 1920/06/20 Today's Date: 10/27/2013 Time:  -       Pt. Unable to sit 80-90 degrees in dysphagia chair for MBS due to complaints of severe right sided pain.  Swallow ssessment was performed at bedside. Please see report for details.    [name] [place].Ed [employer] [phone]  10/27/2013

## 2013-10-27 NOTE — Evaluation (Signed)
Clinical/Bedside Swallow Evaluation Patient Details  Name: [name] MRN: [id number] Date of Birth: 1920/08/14  Today's Date: 10/27/2013 Time: 1127-1202 SLP Time Calculation (min): 35 min  Past Medical History:  Past Medical History  Diagnosis Date  . Presence of permanent cardiac pacemaker 04/2006 upgrade    a. s/p MDT Adapta ADDR01 dual chamber PPM, ser #: [id number] H.  [name] PAF (paroxysmal atrial fibrillation)     a. previously on coumadin-->patient self-d/c'd 11/2012 b/c he was tired of having INR's checked.  . Tachy-brady syndrome   . DM (diabetes mellitus)   . CAD (coronary artery disease)     DES to cx  . Pulmonary embolism 12/2003  . Deep vein thrombophlebitis of leg 2005, 2009    recurrent when off anticoag  . HTN (hypertension)   . Allergic rhinitis   . Asthma   . CKD (chronic kidney disease), stage III   . Nasal polyp    Past Surgical History:  Past Surgical History  Procedure Laterality Date  . Cataract/lens implants    . Nasal polyp surgery    . Coronary angioplasty with stent placement  2008    DES to cx  . Pacemaker insertion  04/2006    Medtronic Adapta  . Cardioversion N/A 03/20/2013    Procedure: CARDIOVERSION;  Surgeon: Carlena Bjornstad, MD;  Location: [facility] ENDOSCOPY;  Service: Cardiovascular;  Laterality: N/A;   HPI:  80 y.o. male with PMH of HTN, HPL, DM, CAD, COPD, CKD, A fib on AC, frequent fall presented with lower R sided abdominal pain since last night, nausea.  MD report pt. found to have a fib RVR and pt. also report of productive cough, with yellow green sputum, wheezing, SOB some fever.  CXR 6/15 new increased density at the right lung base is consistent with atelectasis or pneumonia. There is no evidence of CHF.  Four MBS' performed from 06/02/2008-06/25/2009 with episodes of aspiration with large sips and esophageal deficits with last recommendation being regular texture and thin liquids, small sips.  Baruim esophagram 2006 revealed moderate tertiary  contractions in mid and distal esophagus, small sliding hiatal hernia but no definite reflux.    Assessment / Plan / Recommendation Clinical Impression  Pt. seen at bedside for swallow assessment; was unable to sit upright for MBS.  Wife reports poor appetite at home and coughing during meals.  Pt. has history of dysphagia with aspiration in 2011 and suspect he is now unable to compensate for swallowing deficits due to current fall and decrease in function.  Nectar thick liquids appeared to mildly decrease s/s aspiration in addition to upright posture as tolerated (needs much encouragement to reach minimum 45 degrees and bed in reverse trendelenberg).  Prolonged mastication with cracker, therefore, recommend Dys 3 diet and nectar thick liquids with likely return to thin liquids once strength/endurance increases and pain decreases.  ST will follow while hospitalized.         Aspiration Risk   (mod-severe)    Diet Recommendation Dysphagia 3 (Mechanical Soft);Nectar-thick liquid   Liquid Administration via: Cup;No straw Medication Administration: Crushed with puree Supervision: Patient able to self feed;Full supervision/cueing for compensatory strategies Compensations: Slow rate;Small sips/bites;Check for pocketing Postural Changes and/or Swallow Maneuvers: Seated upright 90 degrees;Upright 30-60 min after meal    Other  Recommendations Oral Care Recommendations: Oral care BID   Follow Up Recommendations   (TBD)    Frequency and Duration min 2x/week  2 weeks   Pertinent Vitals/Pain WDL  Swallow Study          Oral/Motor/Sensory Function Overall Oral Motor/Sensory Function: Appears within functional limits for tasks assessed   Ice Chips Ice chips: Not tested   Thin Liquid Thin Liquid: Impaired Presentation: Cup Pharyngeal  Phase Impairments: Suspected delayed Swallow;Cough - Immediate;Cough - Delayed    Nectar Thick Nectar Thick Liquid: Impaired Presentation:  Cup Pharyngeal Phase Impairments: Cough - Delayed;Throat Clearing - Delayed   Honey Thick Honey Thick Liquid: Not tested   Puree Puree: Impaired Pharyngeal Phase Impairments: Suspected delayed Swallow   Solid   GO    Solid: Impaired Oral Phase Impairments: Reduced lingual movement/coordination Oral Phase Functional Implications:  (prolonged transit)       Houston Siren M.Ed [employer] [phone]  10/27/2013

## 2013-10-28 LAB — CBC
HEMATOCRIT: 40.4 % (ref 39.0–52.0)
Hemoglobin: 12.4 g/dL — ABNORMAL LOW (ref 13.0–17.0)
MCH: 30 pg (ref 26.0–34.0)
MCHC: 30.7 g/dL (ref 30.0–36.0)
MCV: 97.8 fL (ref 78.0–100.0)
Platelets: 180 10*3/uL (ref 150–400)
RBC: 4.13 MIL/uL — ABNORMAL LOW (ref 4.22–5.81)
RDW: 15.7 % — ABNORMAL HIGH (ref 11.5–15.5)
WBC: 12 10*3/uL — AB (ref 4.0–10.5)

## 2013-10-28 LAB — BASIC METABOLIC PANEL
BUN: 18 mg/dL (ref 6–23)
CALCIUM: 8.5 mg/dL (ref 8.4–10.5)
CO2: 21 meq/L (ref 19–32)
Chloride: 107 mEq/L (ref 96–112)
Creatinine, Ser: 1.12 mg/dL (ref 0.50–1.35)
GFR calc Af Amer: 64 mL/min — ABNORMAL LOW (ref >90)
GFR calc non Af Amer: 55 mL/min — ABNORMAL LOW (ref >90)
Glucose, Bld: 248 mg/dL — ABNORMAL HIGH (ref 70–99)
Potassium: 3.5 mEq/L — ABNORMAL LOW (ref 3.7–5.3)
Sodium: 146 mEq/L (ref 137–147)

## 2013-10-28 LAB — GLUCOSE, CAPILLARY
GLUCOSE-CAPILLARY: 245 mg/dL — AB (ref 70–99)
GLUCOSE-CAPILLARY: 181 mg/dL — AB (ref 70–99)
Glucose-Capillary: 208 mg/dL — ABNORMAL HIGH (ref 70–99)
Glucose-Capillary: 99 mg/dL (ref 70–99)

## 2013-10-28 LAB — MAGNESIUM: Magnesium: 1.8 mg/dL (ref 1.5–2.5)

## 2013-10-28 MED ORDER — DILTIAZEM HCL ER COATED BEADS 360 MG PO CP24
360.0000 mg | ORAL_CAPSULE | Freq: Every day | ORAL | Status: DC
  Administered 2013-10-29 – 2013-11-04 (×5): 360 mg via ORAL
  Filled 2013-10-28 (×9): qty 1

## 2013-10-28 MED ORDER — DIGOXIN 125 MCG PO TABS
0.1250 mg | ORAL_TABLET | Freq: Every day | ORAL | Status: DC
  Administered 2013-10-29 – 2013-11-04 (×5): 0.125 mg via ORAL
  Filled 2013-10-28 (×10): qty 1

## 2013-10-28 MED ORDER — POTASSIUM CHLORIDE CRYS ER 20 MEQ PO TBCR
40.0000 meq | EXTENDED_RELEASE_TABLET | Freq: Once | ORAL | Status: AC
  Administered 2013-10-28: 40 meq via ORAL
  Filled 2013-10-28: qty 2

## 2013-10-28 MED ORDER — INSULIN GLARGINE 100 UNIT/ML ~~LOC~~ SOLN
8.0000 [IU] | Freq: Every day | SUBCUTANEOUS | Status: DC
  Administered 2013-10-28: 8 [IU] via SUBCUTANEOUS
  Filled 2013-10-28 (×2): qty 0.08

## 2013-10-28 NOTE — Progress Notes (Signed)
Inpatient Diabetes Program Recommendations  AACE/ADA: New Consensus Statement on Inpatient Glycemic Control (2013)  Target Ranges:  Prepandial:   less than 140 mg/dL      Peak postprandial:   less than 180 mg/dL (1-2 hours)      Critically ill patients:  140 - 180 mg/dL   Reason for Visit: Hyperglycemia  Diabetes history: DM2 Outpatient Diabetes medications: glucotrol 7.5 mg QAM Current orders for Inpatient glycemic control: Novolog resistant tidwc  FBS > 180 mg/dL. PO intake poor. Would likely benefit from addition of small amount basal insulin.  Inpatient Diabetes Program Recommendations Insulin - Basal: Add Levemir 10 units QHS  Note: Will continue to follow. Thank you. Lorenda Peck, RD, LDN, CDE Inpatient Diabetes Coordinator [phone]

## 2013-10-28 NOTE — Progress Notes (Signed)
[place] TEAM 1 - Stepdown/ICU TEAM Progress Note  [name]:[id number] DOB: [birth year] DOA: 10/24/2013 PCP: [name], MD  Admit HPI / Brief Narrative: 80 y.o. male with PMH of HTN, HLD, DM, CAD, COPD, CKD, A fib on AC, and frequent falls who presented with lower R sided abdominal pain of 12hrs duration.  Patient stated he had been nauseated but had not vomited.  During his ED work up he was found to have a RVR and therefore was started on IV cardizem.  Pt also reported productive cough, with yellow green sputum, wheezing, SOB, some fever, and chills. Of note, pt was hospitalized 6/7-6/8 after sustaining a mechanical fall at home and then being found to be suffering an "asthma exacerbation."    HPI/Subjective: Pt denies any complaints today.  His family at bedside report that he looks "better than he has in a while."  Assessment/Plan:  PAF w/ acute RVR RVR appears to be controlled at this time w/ combination of BB, CCB, and Digoxin - no longer appears DH - old records suggest prior fear of previous lung toxicity on amio - will attempt to transition cardizem to oral dosing in AM, along with digoxin - will need digoxin level in day or two   RLL infiltrate  tx empirically for presumed aspiration pneumonitis/pneumonia - clinically responding   Abdominal pain with cholelithiasis CT abdom unrevealing - [place] noted 1.6 cm gallstone in gallbladder neck with significant sludge in gallbladder but no definite evidence of acute cholecystitis - HIDA scan not c/w acute cholecystitis - abdom distention has improved post BM, and family states his abdom looks like his normal today - pt denies abdom pain today   COPD/chronic bronchitis w/ acute bronchospastic exacerbation  resp status stable at this time   Hx of tachy/brady s/p PPM  DM uncontrolled with renal complications G3T 9.3 - CBG poorly controlled - adjust tx further today and follow  CAD s/p DES to cx  Chronic grade 2  diastolic CHF with very mild systolic CHF (EF 51%)  Appears to be euvolemic at this time   Hx of DVT and PE anticoag to continue w/ Xarelto   HTN Follow w/o change today while transitioning to oral meds for Afib   CKD stage III Baseline GFR 30-59 - crt appears to be at baseline   Code Status: NO CODE  Family Communication: spoke w/ pt, son, and wife at bedside  Disposition Plan: SDU until off cardizem gtt   Consultants: none  Procedures: none  Antibiotics: Zosyn 6/12 >>6/13 + 6/15 >>  DVT prophylaxis: Xarelto   Objective: Blood pressure 142/70, pulse 89, temperature 97.9 F (36.6 C), temperature source Oral, resp. rate 27, height 5\' 7"  (1.702 m), weight 77 kg (169 lb 12.1 oz), SpO2 94.00%.  Intake/Output Summary (Last 24 hours) at 10/28/13 1129 Last data filed at 10/28/13 0827  Gross per 24 hour  Intake    305 ml  Output    375 ml  Net    -70 ml   Exam: General: No acute respiratory distress - mildly confused but alert  Lungs: Clear to auscultation bilaterally without wheezes or crackles Cardiovascular: irreg irreg - rate controlled at 80bpm  Abdomen: distended, tympanic, no rebound, bs +, mildly tender th/o, soft, no appreciable mass Extremities: No significant cyanosis, clubbing, or edema bilateral lower extremities  Data Reviewed: Basic Metabolic Panel:  Recent Labs Lab 10/24/13 1124 10/25/13 0323 10/26/13 0310 10/27/13 0334 10/28/13 0230  NA 144 147 149* 149* 146  K 4.6 4.0 3.8 3.4* 3.5*  CL 101 107 103 107 107  CO2 22 27 21 20 21   GLUCOSE 448* 223* 271* 203* 248*  BUN 35* 25* 23 21 18   CREATININE 1.39* 1.35 1.40* 1.15 1.12  CALCIUM 9.5 8.9 9.4 9.1 8.5  MG  --   --   --   --  1.8   Liver Function Tests:  Recent Labs Lab 10/24/13 1552 10/25/13 0323 10/26/13 0310 10/27/13 0334  AST 13 17 23 21   ALT 19 17 18 18   ALKPHOS 92 78 84 83  BILITOT 0.8 1.1 1.3* 1.0  PROT 5.5* 5.5* 6.2 6.1  ALBUMIN 3.1* 2.8* 3.0* 2.8*    Recent Labs Lab  10/24/13 1552  LIPASE 10*   CBC:  Recent Labs Lab 10/24/13 1124 10/25/13 0323 10/26/13 0310 10/27/13 0334 10/28/13 0230  WBC 15.7* 14.8* 19.6* 13.5* 12.0*  HGB 14.7 12.7* 13.5 14.6 12.4*  HCT 46.6 41.1 43.4 46.3 40.4  MCV 96.9 97.2 98.6 98.3 97.8  PLT 174 177 192 169 180   Cardiac Enzymes: No results found for this basename: CKTOTAL, CKMB, CKMBINDEX, TROPONINI,  in the last 168 hours  BNP (last 3 results)  Recent Labs  02/06/13 0200 02/08/13 0630 10/19/13 0915  PROBNP 3722.0* 5645.0* 527.0*   CBG:  Recent Labs Lab 10/27/13 0750 10/27/13 1219 10/27/13 1545 10/27/13 2129 10/28/13 0759  GLUCAP 207* 188* 187* 209* 245*    Recent Results (from the past 240 hour(s))  CULTURE, BLOOD (ROUTINE X 2)     Status: None   Collection Time    10/24/13  4:50 PM      Result Value Ref Range Status   Specimen Description BLOOD RIGHT ARM   Final   Special Requests BOTTLES DRAWN AEROBIC ONLY 10CC   Final   Culture  Setup Time     Final   Value: 10/24/2013 22:28     Performed at [facility]   Culture     Final   Value:        BLOOD CULTURE RECEIVED NO GROWTH TO DATE CULTURE WILL BE HELD FOR 5 DAYS BEFORE ISSUING A FINAL NEGATIVE REPORT     Performed at [facility]   Report Status PENDING   Incomplete  CULTURE, BLOOD (ROUTINE X 2)     Status: None   Collection Time    10/24/13  5:00 PM      Result Value Ref Range Status   Specimen Description BLOOD RIGHT HAND   Final   Special Requests BOTTLES DRAWN AEROBIC AND ANAEROBIC 10CC   Final   Culture  Setup Time     Final   Value: 10/24/2013 22:26     Performed at [facility]   Culture     Final   Value:        BLOOD CULTURE RECEIVED NO GROWTH TO DATE CULTURE WILL BE HELD FOR 5 DAYS BEFORE ISSUING A FINAL NEGATIVE REPORT     Performed at [facility]   Report Status PENDING   Incomplete  MRSA PCR SCREENING     Status: None   Collection Time    10/24/13  5:53 PM      Result Value Ref Range  Status   MRSA by PCR NEGATIVE  NEGATIVE Final   Comment:            The GeneXpert MRSA Assay (FDA     approved for NASAL specimens     only), is one  component of a     comprehensive MRSA colonization     surveillance program. It is not     intended to diagnose MRSA     infection nor to guide or     monitor treatment for     MRSA infections.     Studies:  Recent x-ray studies have been reviewed in detail by the Attending Physician  Scheduled Meds:  Scheduled Meds: . budesonide  0.25 mg Nebulization BID  . guaiFENesin  600 mg Oral BID  . insulin aspart  0-20 Units Subcutaneous TID WC  . ipratropium  0.5 mg Nebulization Q6H  . lactulose  30 g Oral BID  . levalbuterol  0.63 mg Nebulization Q6H  . metoprolol  10 mg Intravenous 4 times per day  . piperacillin-tazobactam (ZOSYN)  IV  3.375 g Intravenous 3 times per day  . Rivaroxaban  15 mg Oral Q supper  . sodium chloride  3 mL Intravenous Q12H    Time spent on care of this patient: 35 mins   MCCLUNG,JEFFREY T , MD   [facility] Office  [phone] Pager - Text Page per Shea Evans as per below:  On-Call/Text Page:      [email or web address]      password [email or web address]  If 7PM-7AM, please contact night-coverage [email or web address] Password [email or web address] 10/28/2013, 11:29 AM   LOS: 4 days

## 2013-10-28 NOTE — Progress Notes (Signed)
Speech Language Pathology Treatment: Dysphagia  Patient Details Name: [name] MRN: [id number] DOB: Sep 05, 1920 Today's Date: 10/28/2013 Time: 9381-8299 SLP Time Calculation (min): 30 min  Assessment / Plan / Recommendation Clinical Impression  Pt seen for tolerance of po diet today.  Respiratory therapy in pt's room finishing breathing tx upon SLP arrival.  SLP assisted pt to brush his teeth reviewing importance of oral care for overall health.   Pt's rigid toothbrush disposed in trash and SlP provided new soft brush to pt.    Pt found with cup of water with straw in bed with him.  Reviewed recommendation to cease use of straw currently due to increased aspiration risk. SLP did provide pt with single ice chips - observing him consume them without overt indication of airway compromise.  Provided pt with small cup sips of nectar thickened juice - delayed cough noted - which was productive.  Tsps appeared to be tolerated better without overt coughing/airway compromise.   Educated pt to clinical reasoning for diet modification and using teach back reinforced effective strategies.    Discussed with pt hx of dysphagia and he reports coughing up frothy secretions at home (? Due to known dysmotility). Per nurse tech, pt is continuing to cough during intake.  Suspect ongoing episodic aspiration will continue given pt's advanced age, COPD, medical status and h/o chronic dysphagia.  Advised pt to SLP role of mitigating aspiration and increasing pt comfort with intake.    Recommend continue dys3/nectar and allowing ice chips for comfort with strict precautions.     HPI HPI: 80 y.o. male with PMH of HTN, HPL, DM, CAD, COPD, CKD, A fib on AC, frequent fall presented with lower R sided abdominal pain since last night, nausea.  MD report pt. found to have a fib RVR and pt. also report of productive cough, with yellow green sputum, wheezing, SOB some fever.  CXR 6/15 new increased density at the right lung  base is consistent with atelectasis or pneumonia. There is no evidence of CHF.  Four MBS' performed from 06/02/2008-06/25/2009 with episodes of aspiration with large sips and esophageal deficits with last recommendation being regular texture and thin liquids, small sips.  Baruim esophagram 2006 revealed moderate tertiary contractions in mid and distal esophagus, small sliding hiatal hernia but no definite reflux.    Pertinent Vitals Afebrile, decreased  SLP Plan  Continue with current plan of care    Recommendations Medication Administration: Whole meds with puree Supervision: Patient able to self feed;Full supervision/cueing for compensatory strategies Compensations: Slow rate;Small sips/bites;Check for pocketing Postural Changes and/or Swallow Maneuvers: Seated upright 90 degrees;Upright 30-60 min after meal              Oral Care Recommendations: Oral care BID Follow up Recommendations:  (tbd) Plan: Continue with current plan of care    [place], [place] [facility] SLP [phone]

## 2013-10-28 NOTE — Progress Notes (Addendum)
ANTICOAGULATION CONSULT NOTE - [place] for Xarelto Indication: nonvalvular afib   Allergies  Allergen Reactions  . Asa Buff (Mag [Buffered Aspirin] Nausea Only  . Ibuprofen     REACTION: causes nausea and vomitting    Patient Measurements: Height: 5\' 7"  (170.2 cm) Weight: 169 lb 12.1 oz (77 kg) IBW/kg (Calculated) : 66.1  Vital Signs: Temp: 97.9 F (36.6 C) (06/16 0827) Temp src: Oral (06/16 0827) BP: 142/70 mmHg (06/16 0827) Pulse Rate: 89 (06/16 0827)  Labs:  Recent Labs  10/26/13 0310 10/27/13 0334 10/28/13 0230  HGB 13.5 14.6 12.4*  HCT 43.4 46.3 40.4  PLT 192 169 180  CREATININE 1.40* 1.15 1.12    Estimated Creatinine Clearance: 40.2 ml/min (by C-G formula based on Cr of 1.12).   Medical History: Past Medical History  Diagnosis Date  . Presence of permanent cardiac pacemaker 04/2006 upgrade    a. s/p MDT Adapta ADDR01 dual chamber PPM, ser #: [id number] H.  [name] PAF (paroxysmal atrial fibrillation)     a. previously on coumadin-->patient self-d/c'd 11/2012 b/c he was tired of having INR's checked.  . Tachy-brady syndrome   . DM (diabetes mellitus)   . CAD (coronary artery disease)     DES to cx  . Pulmonary embolism 12/2003  . Deep vein thrombophlebitis of leg 2005, 2009    recurrent when off anticoag  . HTN (hypertension)   . Allergic rhinitis   . Asthma   . CKD (chronic kidney disease), stage III   . Nasal polyp     Assessment: 89 YOM from home with c/o lumbar back pain, 1 episode of emesis, and mild SOB. During ED workup, found to have afib RVR - started on IV cardizem. Patient on xarelto PTA for hx DVT/PE and Afib, initially on hold until HIDA eval.   6/12 CT JXB:JYNW colonic diverticulosis. No active diverticulitis. Cholelithiasis 6/12 [place]: 1.6 cm diameter gallstone in gallbladder neck with significant sludge in gallbladder.  6/13 HIDA - Patent cystic and common bile ducts, resume xarelto   SCr 1.12, CrCl 40  Goal of  Therapy:  Monitor platelets by anticoagulation protocol: Yes   Plan:  Resume Xarelto 15mg  daily PO qsupper, dose is appropriate. Monitor s/s bleeding   Uvaldo Rising, BCPS  Clinical Pharmacist Pager [phone]  10/28/2013 11:19 AM

## 2013-10-29 LAB — GLUCOSE, CAPILLARY
GLUCOSE-CAPILLARY: 245 mg/dL — AB (ref 70–99)
GLUCOSE-CAPILLARY: 232 mg/dL — AB (ref 70–99)
GLUCOSE-CAPILLARY: 171 mg/dL — AB (ref 70–99)
Glucose-Capillary: 63 mg/dL — ABNORMAL LOW (ref 70–99)
Glucose-Capillary: 170 mg/dL — ABNORMAL HIGH (ref 70–99)

## 2013-10-29 LAB — BASIC METABOLIC PANEL
BUN: 19 mg/dL (ref 6–23)
CO2: 22 mEq/L (ref 19–32)
Calcium: 8.6 mg/dL (ref 8.4–10.5)
Chloride: 111 mEq/L (ref 96–112)
Creatinine, Ser: 1.26 mg/dL (ref 0.50–1.35)
GFR calc Af Amer: 56 mL/min — ABNORMAL LOW (ref >90)
GFR, EST NON AFRICAN AMERICAN: 48 mL/min — AB (ref >90)
GLUCOSE: 243 mg/dL — AB (ref 70–99)
POTASSIUM: 4 meq/L (ref 3.7–5.3)
SODIUM: 147 meq/L (ref 137–147)

## 2013-10-29 MED ORDER — FUROSEMIDE 40 MG PO TABS
40.0000 mg | ORAL_TABLET | Freq: Every day | ORAL | Status: DC
  Administered 2013-10-30 – 2013-10-31 (×2): 40 mg via ORAL
  Filled 2013-10-29 (×5): qty 1

## 2013-10-29 MED ORDER — HYDRALAZINE HCL 10 MG PO TABS
10.0000 mg | ORAL_TABLET | Freq: Three times a day (TID) | ORAL | Status: DC
  Administered 2013-10-29 – 2013-11-01 (×8): 10 mg via ORAL
  Filled 2013-10-29 (×17): qty 1

## 2013-10-29 MED ORDER — FUROSEMIDE 10 MG/ML IJ SOLN
40.0000 mg | Freq: Once | INTRAMUSCULAR | Status: AC
  Administered 2013-10-29: 40 mg via INTRAVENOUS
  Filled 2013-10-29: qty 4

## 2013-10-29 MED ORDER — INSULIN GLARGINE 100 UNIT/ML ~~LOC~~ SOLN
12.0000 [IU] | Freq: Every day | SUBCUTANEOUS | Status: DC
  Administered 2013-10-29: 12 [IU] via SUBCUTANEOUS
  Filled 2013-10-29 (×2): qty 0.12

## 2013-10-29 MED ORDER — DEXTROSE 50 % IV SOLN: 1.0000 | Freq: Once | INTRAVENOUS | Status: AC

## 2013-10-29 MED ORDER — LEVALBUTEROL HCL 0.63 MG/3ML IN NEBU
0.6300 mg | INHALATION_SOLUTION | RESPIRATORY_TRACT | Status: DC | PRN
  Administered 2013-11-02: 0.63 mg via RESPIRATORY_TRACT

## 2013-10-29 MED ORDER — DEXTROSE 50 % IV SOLN
INTRAVENOUS | Status: AC
  Administered 2013-10-29: 50 mL
  Filled 2013-10-29: qty 50

## 2013-10-29 NOTE — Progress Notes (Signed)
[place] TEAM 1 - Stepdown/ICU TEAM Progress Note  [name] QTM:[id number] DOB: Jan 09, 1921 DOA: 10/24/2013 PCP: [name], MD  Admit HPI / Brief Narrative: 80 y.o. male with PMH of HTN, HLD, DM, CAD, COPD, CKD, A fib on AC, and frequent falls who presented with lower R sided abdominal pain of 12hrs duration.  Patient stated he had been nauseated but had not vomited.  During his ED work up he was found to have a RVR and therefore was started on IV cardizem.  Pt also reported productive cough, with yellow green sputum, wheezing, SOB, some fever, and chills. Of note, pt was hospitalized 6/7-6/8 after sustaining a mechanical fall at home and then being found to be suffering an "asthma exacerbation."    HPI/Subjective: Pt was found to have urinary retention by his RN this morning.  Attempts to place a foley cath were unsuccessful.  I have asked the catheter team to place an 68F coude cath.  The pt is quite pleasant, though confused.  He is not aware of any urinary sx or abdom pressure per his report.  He denies abdom pain.     Assessment/Plan:  PAF w/ acute RVR RVR appears to be controlled at this time w/ combination of BB, CCB, and Digoxin - no longer appears DH - old records suggest prior fear of previous lung toxicity on amio - transitioned off cardizem gtt this morning - cont cardizem, BB, and digoxin - check digoxin in AM - if RVR recurs, will resume IV cardizem and consult Cardiology.  RLL infiltrate  tx empirically for presumed aspiration pneumonitis/pneumonia - clinically responding - WBC declining - plan to tx for 7 day course   Abdominal pain with cholelithiasis CT abdom unrevealing - [place] noted 1.6 cm gallstone in gallbladder neck with significant sludge in gallbladder but no definite evidence of acute cholecystitis - HIDA scan not c/w acute cholecystitis - pt denies abdom pain today - ?if initial pain was due to intermittent urinary retention - follow   Acute urinary  retention Catheter Team to place a coude cath - if fails, will have to consult Urology unless spontaneously resolves   COPD/chronic bronchitis w/ acute bronchospastic exacerbation  Pt is wheezing this morning, though he denies SOB - once coude place will gently diurese - cont neb txs   Hx of tachy/brady s/p PPM  DM uncontrolled with renal complications G2B 9.3 - CBG poorly controlled - adjust tx again today and follow  CAD s/p DES to cx  Chronic grade 2 diastolic CHF with very mild systolic CHF (EF 63%)  Appears to be mildly volume overloaded today - will plan to gently diurese once coude cath placed   Hx of DVT and PE anticoag to continue w/ Xarelto   HTN BP poorly controlled - adjust med tx and follow   CKD stage III Baseline GFR 30-59 - crt appears to be at baseline   Code Status: NO CODE  Family Communication: no family present at time of visit today  Disposition Plan: SDU   Consultants: none  Procedures: none  Antibiotics: Zosyn 6/12 >>6/13 + 6/15 >>  DVT prophylaxis: Xarelto   Objective: Blood pressure 160/76, pulse 133, temperature 98.7 F (37.1 C), temperature source Axillary, resp. rate 29, height 5\' 7"  (1.702 m), weight 77 kg (169 lb 12.1 oz), SpO2 97.00%.  Intake/Output Summary (Last 24 hours) at 10/29/13 1312 Last data filed at 10/29/13 0919  Gross per 24 hour  Intake    240 ml  Output    375 ml  Net   -135 ml   Exam: General: tachypneic and wheezing - does not appear to be uncomfortable  Lungs: diffuse exp wheeze and scattered crackles  Cardiovascular: irreg irreg - rate controlled at 80bpm  Abdomen: distended, tympanic, no rebound, bs +, mildly tender th/o, soft, no appreciable mass Extremities: No significant cyanosis, or clubbing; trace edema bilateral lower extremities  Data Reviewed: Basic Metabolic Panel:  Recent Labs Lab 10/25/13 0323 10/26/13 0310 10/27/13 0334 10/28/13 0230 10/29/13 0300  NA 147 149* 149* 146 147  K 4.0 3.8  3.4* 3.5* 4.0  CL 107 103 107 107 111  CO2 27 21 20 21 22   GLUCOSE 223* 271* 203* 248* 243*  BUN 25* 23 21 18 19   CREATININE 1.35 1.40* 1.15 1.12 1.26  CALCIUM 8.9 9.4 9.1 8.5 8.6  MG  --   --   --  1.8  --    Liver Function Tests:  Recent Labs Lab 10/24/13 1552 10/25/13 0323 10/26/13 0310 10/27/13 0334  AST 13 17 23 21   ALT 19 17 18 18   ALKPHOS 92 78 84 83  BILITOT 0.8 1.1 1.3* 1.0  PROT 5.5* 5.5* 6.2 6.1  ALBUMIN 3.1* 2.8* 3.0* 2.8*    Recent Labs Lab 10/24/13 1552  LIPASE 10*   CBC:  Recent Labs Lab 10/24/13 1124 10/25/13 0323 10/26/13 0310 10/27/13 0334 10/28/13 0230  WBC 15.7* 14.8* 19.6* 13.5* 12.0*  HGB 14.7 12.7* 13.5 14.6 12.4*  HCT 46.6 41.1 43.4 46.3 40.4  MCV 96.9 97.2 98.6 98.3 97.8  PLT 174 177 192 169 180   Cardiac Enzymes: No results found for this basename: CKTOTAL, CKMB, CKMBINDEX, TROPONINI,  in the last 168 hours  BNP (last 3 results)  Recent Labs  02/06/13 0200 02/08/13 0630 10/19/13 0915  PROBNP 3722.0* 5645.0* 527.0*   CBG:  Recent Labs Lab 10/28/13 1235 10/28/13 1629 10/28/13 2111 10/29/13 0826 10/29/13 1213  GLUCAP 208* 99 181* 245* 232*    Recent Results (from the past 240 hour(s))  CULTURE, BLOOD (ROUTINE X 2)     Status: None   Collection Time    10/24/13  4:50 PM      Result Value Ref Range Status   Specimen Description BLOOD RIGHT ARM   Final   Special Requests BOTTLES DRAWN AEROBIC ONLY 10CC   Final   Culture  Setup Time     Final   Value: 10/24/2013 22:28     Performed at [facility]   Culture     Final   Value:        BLOOD CULTURE RECEIVED NO GROWTH TO DATE CULTURE WILL BE HELD FOR 5 DAYS BEFORE ISSUING A FINAL NEGATIVE REPORT     Performed at [facility]   Report Status PENDING   Incomplete  CULTURE, BLOOD (ROUTINE X 2)     Status: None   Collection Time    10/24/13  5:00 PM      Result Value Ref Range Status   Specimen Description BLOOD RIGHT HAND   Final   Special Requests  BOTTLES DRAWN AEROBIC AND ANAEROBIC 10CC   Final   Culture  Setup Time     Final   Value: 10/24/2013 22:26     Performed at [facility]   Culture     Final   Value:        BLOOD CULTURE RECEIVED NO GROWTH TO DATE CULTURE WILL BE HELD FOR 5 DAYS  BEFORE ISSUING A FINAL NEGATIVE REPORT     Performed at [facility]   Report Status PENDING   Incomplete  MRSA PCR SCREENING     Status: None   Collection Time    10/24/13  5:53 PM      Result Value Ref Range Status   MRSA by PCR NEGATIVE  NEGATIVE Final   Comment:            The GeneXpert MRSA Assay (FDA     approved for NASAL specimens     only), is one component of a     comprehensive MRSA colonization     surveillance program. It is not     intended to diagnose MRSA     infection nor to guide or     monitor treatment for     MRSA infections.     Studies:  Recent x-ray studies have been reviewed in detail by the Attending Physician  Scheduled Meds:  Scheduled Meds: . budesonide  0.25 mg Nebulization BID  . digoxin  0.125 mg Oral Daily  . diltiazem  360 mg Oral Daily  . guaiFENesin  600 mg Oral BID  . insulin aspart  0-20 Units Subcutaneous TID WC  . insulin glargine  8 Units Subcutaneous QHS  . ipratropium  0.5 mg Nebulization Q6H  . lactulose  30 g Oral BID  . levalbuterol  0.63 mg Nebulization Q6H  . metoprolol  10 mg Intravenous 4 times per day  . piperacillin-tazobactam (ZOSYN)  IV  3.375 g Intravenous 3 times per day  . Rivaroxaban  15 mg Oral Q supper  . sodium chloride  3 mL Intravenous Q12H    Time spent on care of this patient: 35 mins   MCCLUNG,JEFFREY T , MD   [facility] Office  [phone] Pager - Text Page per Shea Evans as per below:  On-Call/Text Page:      [email or web address]      password [email or web address]  If 7PM-7AM, please contact night-coverage [email or web address] Password [email or web address] 10/29/2013, 1:12 PM   LOS: 5 days

## 2013-10-29 NOTE — Progress Notes (Signed)
Bladder scan reads >800, abdomen distended, will place foley per order

## 2013-10-29 NOTE — Progress Notes (Signed)
Notified MD that patient has had some pacer spikes inside his qrs, patient is asymptomatic.  Will continue to monitor

## 2013-10-29 NOTE — Progress Notes (Signed)
Inpatient Diabetes Program Recommendations  AACE/ADA: New Consensus Statement on Inpatient Glycemic Control (2013)  Target Ranges:  Prepandial:   less than 140 mg/dL      Peak postprandial:   less than 180 mg/dL (1-2 hours)      Critically ill patients:  140 - 180 mg/dL   Reason for Visit: Hyperglycemia  Results for [name], [name] (MRN [id number]) as of 10/29/2013 15:14  Ref. Range 10/28/2013 21:11 10/29/2013 08:26 10/29/2013 12:13  Glucose-Capillary Latest Range: 70-99 mg/dL 181 (H) 245 (H) 232 (H)   Blood sugars >200 mg/dL.  Please consider increasing Lantus to 10 units QHS. Change Novolog to Q4H while poor po intake.  Will continue to follow. Thank you. Lorenda Peck, RD, LDN, CDE Inpatient Diabetes Coordinator [phone]

## 2013-10-29 NOTE — Progress Notes (Signed)
Attempted Coude Catheter insertion this morning around 11:30am with an 23 [place] which was unsuccessful. Pt has successful coude insertion with a 16 french, urine return tea colored with sediment.

## 2013-10-30 DIAGNOSIS — Z86718 Personal history of other venous thrombosis and embolism: Secondary | ICD-10-CM

## 2013-10-30 DIAGNOSIS — E870 Hyperosmolality and hypernatremia: Secondary | ICD-10-CM | POA: Diagnosis present

## 2013-10-30 DIAGNOSIS — K8020 Calculus of gallbladder without cholecystitis without obstruction: Principal | ICD-10-CM

## 2013-10-30 DIAGNOSIS — N183 Chronic kidney disease, stage 3 unspecified: Secondary | ICD-10-CM

## 2013-10-30 DIAGNOSIS — J9600 Acute respiratory failure, unspecified whether with hypoxia or hypercapnia: Secondary | ICD-10-CM

## 2013-10-30 DIAGNOSIS — Z950 Presence of cardiac pacemaker: Secondary | ICD-10-CM

## 2013-10-30 DIAGNOSIS — I5032 Chronic diastolic (congestive) heart failure: Secondary | ICD-10-CM

## 2013-10-30 DIAGNOSIS — J9620 Acute and chronic respiratory failure, unspecified whether with hypoxia or hypercapnia: Secondary | ICD-10-CM

## 2013-10-30 DIAGNOSIS — N058 Unspecified nephritic syndrome with other morphologic changes: Secondary | ICD-10-CM

## 2013-10-30 LAB — CULTURE, BLOOD (ROUTINE X 2)
Culture: NO GROWTH
Culture: NO GROWTH

## 2013-10-30 LAB — EXPECTORATED SPUTUM ASSESSMENT W GRAM STAIN, RFLX TO RESP C: Special Requests: NORMAL

## 2013-10-30 LAB — DIGOXIN LEVEL: DIGOXIN LVL: 1 ng/mL (ref 0.8–2.0)

## 2013-10-30 LAB — BASIC METABOLIC PANEL
BUN: 16 mg/dL (ref 6–23)
BUN: 17 mg/dL (ref 6–23)
CALCIUM: 8.9 mg/dL (ref 8.4–10.5)
CALCIUM: 8.7 mg/dL (ref 8.4–10.5)
CO2: 29 meq/L (ref 19–32)
CO2: 23 meq/L (ref 19–32)
CREATININE: 1.38 mg/dL — AB (ref 0.50–1.35)
Chloride: 110 mEq/L (ref 96–112)
Chloride: 109 mEq/L (ref 96–112)
Creatinine, Ser: 1.31 mg/dL (ref 0.50–1.35)
GFR calc Af Amer: 50 mL/min — ABNORMAL LOW (ref >90)
GFR calc Af Amer: 53 mL/min — ABNORMAL LOW (ref >90)
GFR calc non Af Amer: 46 mL/min — ABNORMAL LOW (ref >90)
GFR, EST NON AFRICAN AMERICAN: 43 mL/min — AB (ref >90)
GLUCOSE: 175 mg/dL — AB (ref 70–99)
GLUCOSE: 162 mg/dL — AB (ref 70–99)
Potassium: 3.5 mEq/L — ABNORMAL LOW (ref 3.7–5.3)
Potassium: 4 mEq/L (ref 3.7–5.3)
Sodium: 152 mEq/L — ABNORMAL HIGH (ref 137–147)
Sodium: 146 mEq/L (ref 137–147)

## 2013-10-30 LAB — URINALYSIS, ROUTINE W REFLEX MICROSCOPIC
Glucose, UA: 500 mg/dL — AB
Ketones, ur: NEGATIVE mg/dL
NITRITE: NEGATIVE
Protein, ur: 30 mg/dL — AB
Specific Gravity, Urine: 1.025 (ref 1.005–1.030)
UROBILINOGEN UA: 1 mg/dL (ref 0.0–1.0)
pH: 5 (ref 5.0–8.0)

## 2013-10-30 LAB — CBC
HCT: 42.5 % (ref 39.0–52.0)
Hemoglobin: 13.1 g/dL (ref 13.0–17.0)
MCH: 30.9 pg (ref 26.0–34.0)
MCHC: 30.8 g/dL (ref 30.0–36.0)
MCV: 100.2 fL — ABNORMAL HIGH (ref 78.0–100.0)
PLATELETS: 166 10*3/uL (ref 150–400)
RBC: 4.24 MIL/uL (ref 4.22–5.81)
RDW: 15.7 % — AB (ref 11.5–15.5)
WBC: 18 10*3/uL — ABNORMAL HIGH (ref 4.0–10.5)

## 2013-10-30 LAB — GLUCOSE, CAPILLARY
GLUCOSE-CAPILLARY: 346 mg/dL — AB (ref 70–99)
Glucose-Capillary: 190 mg/dL — ABNORMAL HIGH (ref 70–99)
Glucose-Capillary: 300 mg/dL — ABNORMAL HIGH (ref 70–99)
Glucose-Capillary: 232 mg/dL — ABNORMAL HIGH (ref 70–99)

## 2013-10-30 LAB — EXPECTORATED SPUTUM ASSESSMENT W REFEX TO RESP CULTURE

## 2013-10-30 LAB — URINE MICROSCOPIC-ADD ON

## 2013-10-30 MED ORDER — METOPROLOL TARTRATE 25 MG PO TABS
25.0000 mg | ORAL_TABLET | Freq: Two times a day (BID) | ORAL | Status: DC
  Administered 2013-10-30 – 2013-10-31 (×3): 25 mg via ORAL
  Filled 2013-10-30 (×9): qty 1

## 2013-10-30 MED ORDER — INSULIN GLARGINE 100 UNIT/ML ~~LOC~~ SOLN
5.0000 [IU] | Freq: Once | SUBCUTANEOUS | Status: AC
  Administered 2013-10-30: 5 [IU] via SUBCUTANEOUS
  Filled 2013-10-30 (×2): qty 0.05

## 2013-10-30 MED ORDER — METHYLPREDNISOLONE SODIUM SUCC 125 MG IJ SOLR
60.0000 mg | Freq: Every day | INTRAMUSCULAR | Status: DC
Start: 2013-10-30 — End: 2013-11-02
  Administered 2013-10-30 – 2013-11-02 (×3): 60 mg via INTRAVENOUS
  Filled 2013-10-30 (×4): qty 0.96

## 2013-10-30 MED ORDER — INSULIN GLARGINE 100 UNIT/ML ~~LOC~~ SOLN
20.0000 [IU] | Freq: Every day | SUBCUTANEOUS | Status: DC
  Administered 2013-10-30: 20 [IU] via SUBCUTANEOUS
  Filled 2013-10-30 (×2): qty 0.2

## 2013-10-30 MED ORDER — DEXTROSE 5 % IV SOLN
INTRAVENOUS | Status: DC
  Administered 2013-10-30 – 2013-11-02 (×5): via INTRAVENOUS

## 2013-10-30 NOTE — Progress Notes (Signed)
[place] TEAM 1 - Stepdown/ICU TEAM Progress Note  [name] MGQ:[id number] DOB: February 27, 1921 DOA: 10/24/2013 PCP: [name], MD  Admit HPI / Brief Narrative: 80 y.o. male WM PMHx HTN, HLD, DM, CAD, COPD, CKD,Tachy-brady syndrome  , A fib on NOAC, S/P implanted Pacer 04/2006(MDT Adapta ADDR01 dual chamber PPM, ser #: [id number]). Hx PE 12/2003, Recurrent DVT 2005/2009,  Pesented with lower R sided abdominal pain of 12hrs duration. Patient stated he had been nauseated but had not vomited. During his ED work up he was found to have a RVR and therefore was started on IV cardizem. Pt also reported productive cough, with yellow green sputum, wheezing, SOB, some fever, and chills. Of note, pt was hospitalized 6/7-6/8 after sustaining a mechanical fall at home and then being found to be suffering an "asthma exacerbation."  6/17 Pt was found to have urinary retention by his RN this morning. Attempts to place a foley cath were unsuccessful. I have asked the catheter team to place an 18F coude cath. The pt is quite pleasant, though confused. He is not aware of any urinary sx or abdom pressure per his report. He denies abdom pain.    HPI/Subjective: 6/18 A./O. x4, patient only request is for water. Negative CP/SOB. States was not on home O2 prior to admission  Assessment/Plan: PAF w/ acute RVR  -Currently rate controlled continue digoxin 0.125 mg daily -Continue Cardizem 360 mg daily  -We'll transition patient off of metoprolol IV to metoprolol  PO 25 MG  BID - old records suggest prior fear of previous lung toxicity on amio  RLL infiltrate  -tx empirically for presumed aspiration pneumonitis/pneumonia  -Continue budesonide nebulizer BID -Continue ipratropium nebulizer QID -Continue Xopenex nebulizerQID -Solu-Medrol 60 mg daily -Continue Mucinex BID -Flutter valve q 4hr while awake - clinically patient feels improved however WBCs trending up. (Untreated organism?) -PCXR 6/19  -NTS  for sputum sample/patient comfort  -Continue Zosyn; plan to tx for 7 day course   Abdominal pain with cholelithiasis  -CT abdom unrevealing - [place] noted 1.6 cm gallstone in gallbladder neck with significant sludge in gallbladder but no definite evidence of acute cholecystitis  - HIDA scan not c/w acute cholecystitis  - 6/18 pt denies abdom pain today   Acute urinary retention  -Catheter Team to placed a coude cath -Obtain Urinalysis, urine culture  COPD/chronic bronchitis w/ acute bronchospastic exacerbation  -See RLL infiltrate   Hx of tachy/brady  -s/p pacer placement    DM uncontrolled with renal complications  -6/7 Hemoglobin A1c =9.3  -Increase Lantus to 20 units QHS -Continue resistant SSI -6/18 Lantus 5 units x 1 now  CAD  -s/p DES to cx   Chronic grade 2 diastolic CHF with very mild systolic CHF (EF 99%)  -Monitor fluid intake closely, start D5W at 53ml/hr secondary to hypernatremia   Hx of DVT and PE  anticoag to continue w/ Xarelto   HTN  -Although not within ADA/AHA guideline patient is 80 years old so will allow permissive HTN    CKD stage III  Baseline GFR 30-59 - crt appears to be at baseline   Hypernatremia  -D5W at 72ml/hr -Obtain BMP at 1600   Code Status: FULL Family Communication: no family present at time of exam Disposition Plan: Resolution of pneumonia    Consultants: NA    Procedure/Significant Events: 6/12 CXR;Stable cardiomegaly. No acute cardiopulmonary disease. 6/12 CT abdomen pelvis with contrast; Stable gallstones within the gallbladder.Urinary bladder is moderately distended   6/12 ultrasound  abdomen complete; Gallbladder Distended by sludge. 1.6 cm shadowing calculus a gallbladder neck. Upper normal gallbladder wall thickness. No definite pericholecystic fluid or sonographic Murphy sign. 6/13 HIDA scan;Patent cystic and common bile ducts. 6/15 PCXR;New increased density right lung base consistent with atelectasis or pneumonia.  No CHF.    Culture 6/12 Blood Rt Arm/Hand NTD 6/12 MRSA by PCR Negative 6/18 urine pending     Antibiotics: Zosyn 6/12 >>6/13 + 6/15 >>   DVT prophylaxis: Xarelto    Devices Pacer   LINES / TUBES:  6/14 22ga right upper arm 6/14 22ga right forearm    Continuous Infusions: . sodium chloride 75 mL/hr at 10/26/13 1732  . dextrose 75 mL/hr at 10/30/13 1317    Objective: VITAL SIGNS: Temp: 98.2 F (36.8 C) (06/18 1200) Temp src: Oral (06/18 1200) BP: 159/67 mmHg (06/18 1300) Pulse Rate: 80 (06/18 1300) SPO2; FIO2:   Intake/Output Summary (Last 24 hours) at 10/30/13 1320 Last data filed at 10/30/13 0700  Gross per 24 hour  Intake    165 ml  Output   2225 ml  Net  -2060 ml     Exam: General: A./O. x4, NAD, No acute respiratory distress Lungs: Bibasilar/bronchial rales, negative wheezes or crackles Cardiovascular: Regular rate and rhythm without murmur gallop or rub normal S1 and S2 Abdomen: Nontender, nondistended, soft, bowel sounds positive, no rebound, no ascites, no appreciable mass Extremities: No significant cyanosis, clubbing, or edema bilateral lower extremities  Data Reviewed: Basic Metabolic Panel:  Recent Labs Lab 10/26/13 0310 10/27/13 0334 10/28/13 0230 10/29/13 0300 10/30/13 0354  NA 149* 149* 146 147 152*  K 3.8 3.4* 3.5* 4.0 3.5*  CL 103 107 107 111 110  CO2 21 20 21 22 29   GLUCOSE 271* 203* 248* 243* 175*  BUN 23 21 18 19 16   CREATININE 1.40* 1.15 1.12 1.26 1.38*  CALCIUM 9.4 9.1 8.5 8.6 8.9  MG  --   --  1.8  --   --    Liver Function Tests:  Recent Labs Lab 10/24/13 1552 10/25/13 0323 10/26/13 0310 10/27/13 0334  AST 13 17 23 21   ALT 19 17 18 18   ALKPHOS 92 78 84 83  BILITOT 0.8 1.1 1.3* 1.0  PROT 5.5* 5.5* 6.2 6.1  ALBUMIN 3.1* 2.8* 3.0* 2.8*    Recent Labs Lab 10/24/13 1552  LIPASE 10*   No results found for this basename: AMMONIA,  in the last 168 hours CBC:  Recent Labs Lab 10/25/13 0323  10/26/13 0310 10/27/13 0334 10/28/13 0230 10/30/13 0354  WBC 14.8* 19.6* 13.5* 12.0* 18.0*  HGB 12.7* 13.5 14.6 12.4* 13.1  HCT 41.1 43.4 46.3 40.4 42.5  MCV 97.2 98.6 98.3 97.8 100.2*  PLT 177 192 169 180 166   Cardiac Enzymes: No results found for this basename: CKTOTAL, CKMB, CKMBINDEX, TROPONINI,  in the last 168 hours BNP (last 3 results)  Recent Labs  02/06/13 0200 02/08/13 0630 10/19/13 0915  PROBNP 3722.0* 5645.0* 527.0*   CBG:  Recent Labs Lab 10/29/13 1636 10/29/13 1755 10/29/13 2202 10/30/13 0804 10/30/13 1244  GLUCAP 63* 171* 170* 190* 300*    Recent Results (from the past 240 hour(s))  CULTURE, BLOOD (ROUTINE X 2)     Status: None   Collection Time    10/24/13  4:50 PM      Result Value Ref Range Status   Specimen Description BLOOD RIGHT ARM   Final   Special Requests BOTTLES DRAWN AEROBIC ONLY 10CC   Final  Culture  Setup Time     Final   Value: 10/24/2013 22:28     Performed at [facility]   Culture     Final   Value: NO GROWTH 5 DAYS     Performed at [facility]   Report Status 10/30/2013 FINAL   Final  CULTURE, BLOOD (ROUTINE X 2)     Status: None   Collection Time    10/24/13  5:00 PM      Result Value Ref Range Status   Specimen Description BLOOD RIGHT HAND   Final   Special Requests BOTTLES DRAWN AEROBIC AND ANAEROBIC 10CC   Final   Culture  Setup Time     Final   Value: 10/24/2013 22:26     Performed at [facility]   Culture     Final   Value: NO GROWTH 5 DAYS     Performed at [facility]   Report Status 10/30/2013 FINAL   Final  MRSA PCR SCREENING     Status: None   Collection Time    10/24/13  5:53 PM      Result Value Ref Range Status   MRSA by PCR NEGATIVE  NEGATIVE Final   Comment:            The GeneXpert MRSA Assay (FDA     approved for NASAL specimens     only), is one component of a     comprehensive MRSA colonization     surveillance program. It is not     intended to diagnose  MRSA     infection nor to guide or     monitor treatment for     MRSA infections.     Studies:  Recent x-ray studies have been reviewed in detail by the Attending Physician  Scheduled Meds:  Scheduled Meds: . budesonide  0.25 mg Nebulization BID  . digoxin  0.125 mg Oral Daily  . diltiazem  360 mg Oral Daily  . furosemide  40 mg Oral Daily  . guaiFENesin  600 mg Oral BID  . hydrALAZINE  10 mg Oral 3 times per day  . insulin aspart  0-20 Units Subcutaneous TID WC  . insulin glargine  20 Units Subcutaneous QHS  . insulin glargine  5 Units Subcutaneous Once  . ipratropium  0.5 mg Nebulization Q6H  . lactulose  30 g Oral BID  . levalbuterol  0.63 mg Nebulization Q6H  . methylPREDNISolone (SOLU-MEDROL) injection  60 mg Intravenous Daily  . metoprolol tartrate  25 mg Oral BID  . piperacillin-tazobactam (ZOSYN)  IV  3.375 g Intravenous 3 times per day  . Rivaroxaban  15 mg Oral Q supper  . sodium chloride  3 mL Intravenous Q12H    Time spent on care of this patient: 40 mins   Allie Bossier , MD   [facility] Office  [phone] Pager [phone]  On-Call/Text Page:      [email or web address]      password [email or web address]  If 7PM-7AM, please contact night-coverage [email or web address] Password [email or web address] 10/30/2013, 1:20 PM   LOS: 6 days

## 2013-10-30 NOTE — Progress Notes (Signed)
Inpatient Diabetes Program Recommendations  AACE/ADA: New Consensus Statement on Inpatient Glycemic Control (2013)  Target Ranges:  Prepandial:   less than 140 mg/dL      Peak postprandial:   less than 180 mg/dL (1-2 hours)      Critically ill patients:  140 - 180 mg/dL   Reason for Visit: Hypoglycemia  Results for [name], [name] (MRN [id number]) as of 10/30/2013 09:56  Ref. Range 10/29/2013 16:36 10/29/2013 17:55 10/29/2013 22:02 10/30/2013 08:04  Glucose-Capillary Latest Range: 70-99 mg/dL 63 (L) 171 (H) 170 (H) 190 (H)   Hypoglycemia after Novolog 7 units given for correction on 6/17.  Please consider reducing Novolog to moderate tidwc and hs. Continue Lantus 12 units QHS.  Will continue to follow while inpatient.  Thank you. Lorenda Peck, RD, LDN, CDE Inpatient Diabetes Coordinator [phone]

## 2013-10-30 NOTE — Progress Notes (Signed)
RT started flutter with pt. Per order, order in for Sputum for C&S, pt. able to cough up some on own, labelled/sent to lab with requisition, RN made aware.

## 2013-10-31 ENCOUNTER — Inpatient Hospital Stay (HOSPITAL_COMMUNITY): Payer: Medicare Other

## 2013-10-31 DIAGNOSIS — E118 Type 2 diabetes mellitus with unspecified complications: Secondary | ICD-10-CM

## 2013-10-31 DIAGNOSIS — E1165 Type 2 diabetes mellitus with hyperglycemia: Secondary | ICD-10-CM

## 2013-10-31 DIAGNOSIS — R338 Other retention of urine: Secondary | ICD-10-CM

## 2013-10-31 DIAGNOSIS — I129 Hypertensive chronic kidney disease with stage 1 through stage 4 chronic kidney disease, or unspecified chronic kidney disease: Secondary | ICD-10-CM

## 2013-10-31 DIAGNOSIS — N183 Chronic kidney disease, stage 3 unspecified: Secondary | ICD-10-CM

## 2013-10-31 DIAGNOSIS — E119 Type 2 diabetes mellitus without complications: Secondary | ICD-10-CM

## 2013-10-31 DIAGNOSIS — J690 Pneumonitis due to inhalation of food and vomit: Secondary | ICD-10-CM | POA: Diagnosis present

## 2013-10-31 DIAGNOSIS — IMO0002 Reserved for concepts with insufficient information to code with codable children: Secondary | ICD-10-CM

## 2013-10-31 DIAGNOSIS — N390 Urinary tract infection, site not specified: Secondary | ICD-10-CM

## 2013-10-31 DIAGNOSIS — I10 Essential (primary) hypertension: Secondary | ICD-10-CM

## 2013-10-31 DIAGNOSIS — I4891 Unspecified atrial fibrillation: Secondary | ICD-10-CM

## 2013-10-31 DIAGNOSIS — J42 Unspecified chronic bronchitis: Secondary | ICD-10-CM

## 2013-10-31 LAB — URINE CULTURE
COLONY COUNT: NO GROWTH
Culture: NO GROWTH

## 2013-10-31 LAB — COMPREHENSIVE METABOLIC PANEL
ALT: 13 U/L (ref 0–53)
AST: 14 U/L (ref 0–37)
Albumin: 2.3 g/dL — ABNORMAL LOW (ref 3.5–5.2)
Alkaline Phosphatase: 76 U/L (ref 39–117)
BUN: 20 mg/dL (ref 6–23)
CO2: 27 mEq/L (ref 19–32)
CREATININE: 1.43 mg/dL — AB (ref 0.50–1.35)
Calcium: 8.5 mg/dL (ref 8.4–10.5)
Chloride: 104 mEq/L (ref 96–112)
GFR, EST AFRICAN AMERICAN: 48 mL/min — AB (ref >90)
GFR, EST NON AFRICAN AMERICAN: 41 mL/min — AB (ref >90)
GLUCOSE: 355 mg/dL — AB (ref 70–99)
Potassium: 3.6 mEq/L — ABNORMAL LOW (ref 3.7–5.3)
Sodium: 143 mEq/L (ref 137–147)
Total Bilirubin: 0.7 mg/dL (ref 0.3–1.2)
Total Protein: 5.2 g/dL — ABNORMAL LOW (ref 6.0–8.3)

## 2013-10-31 LAB — URIC ACID: Uric Acid, Serum: 4.8 mg/dL (ref 4.0–7.8)

## 2013-10-31 LAB — CBC WITH DIFFERENTIAL/PLATELET
Basophils Absolute: 0 10*3/uL (ref 0.0–0.1)
Basophils Relative: 0 % (ref 0–1)
EOS ABS: 0 10*3/uL (ref 0.0–0.7)
Eosinophils Relative: 0 % (ref 0–5)
HCT: 39 % (ref 39.0–52.0)
Hemoglobin: 12.3 g/dL — ABNORMAL LOW (ref 13.0–17.0)
LYMPHS ABS: 0.5 10*3/uL — AB (ref 0.7–4.0)
LYMPHS PCT: 3 % — AB (ref 12–46)
MCH: 30.5 pg (ref 26.0–34.0)
MCHC: 31.5 g/dL (ref 30.0–36.0)
MCV: 96.8 fL (ref 78.0–100.0)
Monocytes Absolute: 0.2 10*3/uL (ref 0.1–1.0)
Monocytes Relative: 1 % — ABNORMAL LOW (ref 3–12)
NEUTROS PCT: 96 % — AB (ref 43–77)
Neutro Abs: 16.7 10*3/uL — ABNORMAL HIGH (ref 1.7–7.7)
PLATELETS: 170 10*3/uL (ref 150–400)
RBC: 4.03 MIL/uL — AB (ref 4.22–5.81)
RDW: 15.2 % (ref 11.5–15.5)
WBC: 17.4 10*3/uL — ABNORMAL HIGH (ref 4.0–10.5)

## 2013-10-31 LAB — GLUCOSE, CAPILLARY
GLUCOSE-CAPILLARY: 329 mg/dL — AB (ref 70–99)
GLUCOSE-CAPILLARY: 102 mg/dL — AB (ref 70–99)
Glucose-Capillary: 357 mg/dL — ABNORMAL HIGH (ref 70–99)
Glucose-Capillary: 75 mg/dL (ref 70–99)

## 2013-10-31 LAB — CREATININE, URINE, RANDOM: Creatinine, Urine: 57.76 mg/dL

## 2013-10-31 LAB — MAGNESIUM: MAGNESIUM: 1.7 mg/dL (ref 1.5–2.5)

## 2013-10-31 LAB — PRO B NATRIURETIC PEPTIDE: PRO B NATRI PEPTIDE: 4249 pg/mL — AB (ref 0–450)

## 2013-10-31 MED ORDER — IPRATROPIUM BROMIDE 0.02 % IN SOLN
0.5000 mg | Freq: Four times a day (QID) | RESPIRATORY_TRACT | Status: DC
  Administered 2013-10-31 – 2013-11-06 (×22): 0.5 mg via RESPIRATORY_TRACT
  Filled 2013-10-31 (×24): qty 2.5

## 2013-10-31 MED ORDER — BIOTENE DRY MOUTH MT LIQD
15.0000 mL | Freq: Two times a day (BID) | OROMUCOSAL | Status: DC
  Administered 2013-10-31 – 2013-11-06 (×12): 15 mL via OROMUCOSAL

## 2013-10-31 MED ORDER — INSULIN GLARGINE 100 UNIT/ML ~~LOC~~ SOLN
5.0000 [IU] | Freq: Once | SUBCUTANEOUS | Status: AC
  Administered 2013-10-31: 5 [IU] via SUBCUTANEOUS
  Filled 2013-10-31: qty 0.05

## 2013-10-31 MED ORDER — INSULIN GLARGINE 100 UNIT/ML ~~LOC~~ SOLN
25.0000 [IU] | Freq: Every day | SUBCUTANEOUS | Status: DC
  Administered 2013-10-31 – 2013-11-02 (×3): 25 [IU] via SUBCUTANEOUS
  Filled 2013-10-31 (×4): qty 0.25

## 2013-10-31 MED ORDER — LEVALBUTEROL HCL 0.63 MG/3ML IN NEBU
0.6300 mg | INHALATION_SOLUTION | Freq: Two times a day (BID) | RESPIRATORY_TRACT | Status: DC
  Administered 2013-10-31 – 2013-11-05 (×10): 0.63 mg via RESPIRATORY_TRACT
  Filled 2013-10-31 (×16): qty 3

## 2013-10-31 NOTE — Progress Notes (Signed)
Speech Language Pathology Treatment: Dysphagia  Patient Details Name: [name] MRN: [id number] DOB: 1920/08/01 Today's Date: 10/31/2013 Time: 1100-1120 SLP Time Calculation (min): 20 min  Assessment / Plan / Recommendation Clinical Impression  SLP provided ongoing diagnostic treatment of dysphagia. MD reordered SLP due to persistent concerns for aspiration. Pt continues to demonstrate immediate evidence of aspiration with thin liquids and delayed evidence of aspiration with nectar thick liquids, possibly also due to prior dx of esophageal dysphagia. Pt is recommended to continue a dys 2/nectar thick diet (cup or teaspoon if coughing is excessive) though aspiration risk is still moderate to high and quality of life is compromised. Pt and family may want to consider thin liquids for comfort given that aspiration risk is chronic and pt wants to drink water. Discussed with pt/son and MD. Await MD discussion with family and will defer diet orders to him based on decisions.    HPI HPI: 80 y.o. male with PMH of HTN, HPL, DM, CAD, COPD, CKD, A fib on AC, frequent fall presented with lower R sided abdominal pain since last night, nausea.  MD report pt. found to have a fib RVR and pt. also report of productive cough, with yellow green sputum, wheezing, SOB some fever.  CXR 6/15 new increased density at the right lung base is consistent with atelectasis or pneumonia. There is no evidence of CHF.  Four MBS' performed from 06/02/2008-06/25/2009 with episodes of aspiration with large sips and esophageal deficits with last recommendation being regular texture and thin liquids, small sips.  Baruim esophagram 2006 revealed moderate tertiary contractions in mid and distal esophagus, small sliding hiatal hernia but no definite reflux.    Pertinent Vitals NA  SLP Plan  Continue with current plan of care    Recommendations Diet recommendations: Dysphagia 2 (fine chop);Nectar-thick liquid Liquids provided via:  Cup;Teaspoon Medication Administration: Whole meds with puree Supervision: Patient able to self feed;Full supervision/cueing for compensatory strategies Compensations: Slow rate;Small sips/bites;Check for pocketing Postural Changes and/or Swallow Maneuvers: Seated upright 90 degrees;Upright 30-60 min after meal              Oral Care Recommendations: Oral care BID Plan: Continue with current plan of care    GO    [facility], MA CCC-SLP [phone]  [name] 10/31/2013, 12:05 PM

## 2013-10-31 NOTE — Progress Notes (Signed)
Inpatient Diabetes Program Recommendations  AACE/ADA: New Consensus Statement on Inpatient Glycemic Control (2013)  Target Ranges:  Prepandial:   less than 140 mg/dL      Peak postprandial:   less than 180 mg/dL (1-2 hours)      Critically ill patients:  140 - 180 mg/dL   Reason for Visit: Hyperglycemia  Results for [name], [name] (MRN [id number]) as of 10/31/2013 08:30  Ref. Range 10/30/2013 12:44 10/30/2013 19:02 10/30/2013 22:50  Glucose-Capillary Latest Range: 70-99 mg/dL 300 (H) 232 (H) 346 (H)   Hyperglycemia with steroids.  Consider increasing Lantus to 22 units QHS. May need Novolog 3 units tidwc if post-prandial blood sugars >200 mg/dL.  Will continue to follow. Thank you. Lorenda Peck, RD, LDN, CDE Inpatient Diabetes Coordinator [phone]

## 2013-10-31 NOTE — Progress Notes (Signed)
[place] TEAM 1 - Stepdown/ICU TEAM Progress Note  [name] OZD:[id number] DOB: [birth year] DOA: 10/24/2013 PCP: [name], MD  Admit HPI / Brief Narrative: 80 y.o.WM PMHx HTN, HLD, DM, CAD, COPD, CKD,Tachy-brady syndrome  , A fib on NOAC, S/P implanted Pacer 04/2006(MDT Adapta ADDR01 dual chamber PPM, ser #: [id number]). Hx PE 12/2003, Recurrent DVT 2005/2009,  Pesented with lower R sided abdominal pain of 12hrs duration. Patient stated he had been nauseated but had not vomited. During his ED work up he was found to have a RVR and therefore was started on IV cardizem. Pt also reported productive cough, with yellow green sputum, wheezing, SOB, some fever, and chills. Of note, pt was hospitalized 6/7-6/8 after sustaining a mechanical fall at home and then being found to be suffering an "asthma exacerbation."  6/17 Pt was found to have urinary retention by his RN this morning. Attempts to place a foley cath were unsuccessful. I have asked the catheter team to place an 67F coude cath. The pt is quite pleasant, though confused. He is not aware of any urinary sx or abdom pressure per his report. He denies abdom pain.    HPI/Subjective: 6/19 and A./O. x4, states he is aspirating. States he feels that he does okay with water however believes some of his food is being aspirated. Negative CP/SOB. (not on home O2)  Assessment/Plan: PAF w/ acute RVR  -Currently rate controlled continue digoxin 0.125 mg daily -Continue Cardizem 360 mg daily  -Continue metoprolol  PO 25 MG  BID - old records suggest prior fear of previous lung toxicity on amio  RLL infiltrate  -6/19 per speech evaluation; Pt continues to demonstrate immediate evidence of aspiration with thin liquids and delayed evidence of aspiration with nectar thick liquids, possibly also due to prior dx of esophageal dysphagia -tx for aspiration pneumonitis/pneumonia  -Continue budesonide nebulizer BID -Continue ipratropium nebulizer  QID -Continue Xopenex nebulizerQID -Solu-Medrol 60 mg daily -Continue Mucinex BID -Flutter valve q 4hr while awake - PCXR 6/19; stable  -Sputum culture pending   -Continue Zosyn; plan to tx for 7 day course   Aspiration -Spoke with patient and son [name] at length concerning his ongoing aspiration. [name] will bring his mother in order to have a discussion concerning nutrition going forward.  Abdominal pain with cholelithiasis  -CT abdom unrevealing - [place] noted 1.6 cm gallstone in gallbladder neck with significant sludge in gallbladder but no definite evidence of acute cholecystitis  - HIDA scan not c/w acute cholecystitis  - 6/19 pt denies abdom pain today   Acute urinary retention  -Catheter Team to placed a coude cath -Urine culture pending   UTI -Patient has moderate LE, large amount of hemoglobin in urine from 6/18 which would be consistent with UTI, awaiting cultures. Continue Zosyn -Urine sample also containing uric acid crystals, however patient states never had gout. However with his chronic urinary insufficiency need to consider uric acid stones/nephropathy. Obtain serum uric acid level, urine uric acid level, and urine creatinine.  COPD/chronic bronchitis w/ acute bronchospastic exacerbation  -See RLL infiltrate -6/19 PCXR stable  Hx of tachy/brady  -s/p pacer placement    DM uncontrolled with renal complications  -6/7 Hemoglobin A1c =9.3  -6/19 Increase Lantus to 25 units QHS -6/19 decrease D5 W. to 41ml/hr -Continue resistant SSI -6/19 Lantus 5 units x 1 now  CAD  -s/p DES to cx   Chronic grade 2 diastolic CHF with very mild systolic CHF (EF 64%)  -Monitor fluid intake  closely, decrease D5 W. to 21ml/hr   Hx of DVT and PE  anticoag to continue w/ Xarelto   HTN  -Although not within ADA/AHA guideline patient is 80 years old so will allow permissive HTN    CKD stage III  Baseline GFR 30-59 - crt appears to be at baseline   Hypernatremia  -6/19  decrease D5 W. to 47ml/hr -Obtain CMP in a.m.    Code Status: FULL Family Communication: no family present at time of exam Disposition Plan: Resolution of pneumonia    Consultants: NA    Procedure/Significant Events: 6/12 CXR;Stable cardiomegaly. No acute cardiopulmonary disease. 6/12 CT abdomen pelvis with contrast; Stable gallstones within the gallbladder.Urinary bladder is moderately distended   6/12 ultrasound abdomen complete; Gallbladder Distended by sludge. 1.6 cm shadowing calculus a gallbladder neck. Upper normal gallbladder wall thickness. No definite pericholecystic fluid or sonographic Murphy sign. 6/13 HIDA scan;Patent cystic and common bile ducts. 6/15 PCXR;New increased density right lung base consistent with atelectasis or pneumonia. No CHF.    Culture 6/12 Blood Rt Arm/Hand NTD (final) 6/12 MRSA by PCR Negative 6/18 urine NTD  6/18 sputum positive rare gram-positive cocci in pairs     Antibiotics: Zosyn 6/12 >>6/13 + 6/15 >>   DVT prophylaxis: Xarelto    Devices Pacer   LINES / TUBES:  6/14 22ga right upper arm 6/14 22ga right forearm    Continuous Infusions: . sodium chloride 75 mL/hr at 10/26/13 1732  . dextrose 75 mL/hr at 10/31/13 0156    Objective: VITAL SIGNS: Temp: 97.7 F (36.5 C) (06/19 0823) Temp src: Oral (06/19 0823) BP: 151/98 mmHg (06/19 0823) Pulse Rate: 98 (06/19 0823) SPO2; FIO2:   Intake/Output Summary (Last 24 hours) at 10/31/13 0841 Last data filed at 10/30/13 2158  Gross per 24 hour  Intake    858 ml  Output    300 ml  Net    558 ml     Exam: General: A./O. x4, NAD, No acute respiratory distress Lungs: Bibasilar/bronchial rales decreased from 6/18, positive expiratory wheezing diffusely Cardiovascular: Regular rate and rhythm without murmur gallop or rub normal S1 and S2 Abdomen: Nontender, nondistended, soft, bowel sounds positive, no rebound, no ascites, no appreciable mass Extremities: No  significant cyanosis, clubbing, or edema bilateral lower extremities  Data Reviewed: Basic Metabolic Panel:  Recent Labs Lab 10/28/13 0230 10/29/13 0300 10/30/13 0354 10/30/13 1547 10/31/13 0500  NA 146 147 152* 146 143  K 3.5* 4.0 3.5* 4.0 3.6*  CL 107 111 110 109 104  CO2 21 22 29 23 27   GLUCOSE 248* 243* 175* 162* 355*  BUN 18 19 16 17 20   CREATININE 1.12 1.26 1.38* 1.31 1.43*  CALCIUM 8.5 8.6 8.9 8.7 8.5  MG 1.8  --   --   --  1.7   Liver Function Tests:  Recent Labs Lab 10/24/13 1552 10/25/13 0323 10/26/13 0310 10/27/13 0334 10/31/13 0500  AST 13 17 23 21 14   ALT 19 17 18 18 13   ALKPHOS 92 78 84 83 76  BILITOT 0.8 1.1 1.3* 1.0 0.7  PROT 5.5* 5.5* 6.2 6.1 5.2*  ALBUMIN 3.1* 2.8* 3.0* 2.8* 2.3*    Recent Labs Lab 10/24/13 1552  LIPASE 10*   No results found for this basename: AMMONIA,  in the last 168 hours CBC:  Recent Labs Lab 10/26/13 0310 10/27/13 0334 10/28/13 0230 10/30/13 0354 10/31/13 0500  WBC 19.6* 13.5* 12.0* 18.0* 17.4*  NEUTROABS  --   --   --   --  16.7*  HGB 13.5 14.6 12.4* 13.1 12.3*  HCT 43.4 46.3 40.4 42.5 39.0  MCV 98.6 98.3 97.8 100.2* 96.8  PLT 192 169 180 166 170   Cardiac Enzymes: No results found for this basename: CKTOTAL, CKMB, CKMBINDEX, TROPONINI,  in the last 168 hours BNP (last 3 results)  Recent Labs  02/08/13 0630 10/19/13 0915 10/31/13 0500  PROBNP 5645.0* 527.0* 4249.0*   CBG:  Recent Labs Lab 10/29/13 2202 10/30/13 0804 10/30/13 1244 10/30/13 1902 10/30/13 2250  GLUCAP 170* 190* 300* 232* 346*    Recent Results (from the past 240 hour(s))  CULTURE, BLOOD (ROUTINE X 2)     Status: None   Collection Time    10/24/13  4:50 PM      Result Value Ref Range Status   Specimen Description BLOOD RIGHT ARM   Final   Special Requests BOTTLES DRAWN AEROBIC ONLY 10CC   Final   Culture  Setup Time     Final   Value: 10/24/2013 22:28     Performed at [facility]   Culture     Final   Value:  NO GROWTH 5 DAYS     Performed at [facility]   Report Status 10/30/2013 FINAL   Final  CULTURE, BLOOD (ROUTINE X 2)     Status: None   Collection Time    10/24/13  5:00 PM      Result Value Ref Range Status   Specimen Description BLOOD RIGHT HAND   Final   Special Requests BOTTLES DRAWN AEROBIC AND ANAEROBIC 10CC   Final   Culture  Setup Time     Final   Value: 10/24/2013 22:26     Performed at [facility]   Culture     Final   Value: NO GROWTH 5 DAYS     Performed at [facility]   Report Status 10/30/2013 FINAL   Final  MRSA PCR SCREENING     Status: None   Collection Time    10/24/13  5:53 PM      Result Value Ref Range Status   MRSA by PCR NEGATIVE  NEGATIVE Final   Comment:            The GeneXpert MRSA Assay (FDA     approved for NASAL specimens     only), is one component of a     comprehensive MRSA colonization     surveillance program. It is not     intended to diagnose MRSA     infection nor to guide or     monitor treatment for     MRSA infections.  CULTURE, EXPECTORATED SPUTUM-ASSESSMENT     Status: None   Collection Time    10/30/13  1:40 PM      Result Value Ref Range Status   Specimen Description SPUTUM   Final   Special Requests Normal   Final   Sputum evaluation     Final   Value: THIS SPECIMEN IS ACCEPTABLE. RESPIRATORY CULTURE REPORT TO FOLLOW.   Report Status 10/30/2013 FINAL   Final     Studies:  Recent x-ray studies have been reviewed in detail by the Attending Physician  Scheduled Meds:  Scheduled Meds: . budesonide  0.25 mg Nebulization BID  . digoxin  0.125 mg Oral Daily  . diltiazem  360 mg Oral Daily  . furosemide  40 mg Oral Daily  . guaiFENesin  600 mg Oral BID  . hydrALAZINE  10 mg Oral  3 times per day  . insulin aspart  0-20 Units Subcutaneous TID WC  . insulin glargine  20 Units Subcutaneous QHS  . ipratropium  0.5 mg Nebulization Q6H  . lactulose  30 g Oral BID  . levalbuterol  0.63 mg Nebulization  Q6H  . methylPREDNISolone (SOLU-MEDROL) injection  60 mg Intravenous Daily  . metoprolol tartrate  25 mg Oral BID  . piperacillin-tazobactam (ZOSYN)  IV  3.375 g Intravenous 3 times per day  . Rivaroxaban  15 mg Oral Q supper  . sodium chloride  3 mL Intravenous Q12H    Time spent on care of this patient: 40 mins   Allie Bossier , MD   [facility] Office  [phone] Pager [phone]  On-Call/Text Page:      [email or web address]      password [email or web address]  If 7PM-7AM, please contact night-coverage [email or web address] Password [email or web address] 10/31/2013, 8:41 AM   LOS: 7 days

## 2013-11-01 DIAGNOSIS — G9340 Encephalopathy, unspecified: Secondary | ICD-10-CM

## 2013-11-01 DIAGNOSIS — I739 Peripheral vascular disease, unspecified: Secondary | ICD-10-CM

## 2013-11-01 DIAGNOSIS — E876 Hypokalemia: Secondary | ICD-10-CM | POA: Diagnosis present

## 2013-11-01 LAB — CBC WITH DIFFERENTIAL/PLATELET
Basophils Absolute: 0 10*3/uL (ref 0.0–0.1)
Basophils Relative: 0 % (ref 0–1)
EOS PCT: 0 % (ref 0–5)
Eosinophils Absolute: 0 10*3/uL (ref 0.0–0.7)
HEMATOCRIT: 36.2 % — AB (ref 39.0–52.0)
HEMOGLOBIN: 11.9 g/dL — AB (ref 13.0–17.0)
LYMPHS ABS: 0.7 10*3/uL (ref 0.7–4.0)
LYMPHS PCT: 3 % — AB (ref 12–46)
MCH: 30.8 pg (ref 26.0–34.0)
MCHC: 32.9 g/dL (ref 30.0–36.0)
MCV: 93.8 fL (ref 78.0–100.0)
Monocytes Absolute: 0.7 10*3/uL (ref 0.1–1.0)
Monocytes Relative: 3 % (ref 3–12)
Neutro Abs: 21.1 10*3/uL — ABNORMAL HIGH (ref 1.7–7.7)
Neutrophils Relative %: 94 % — ABNORMAL HIGH (ref 43–77)
Platelets: 246 10*3/uL (ref 150–400)
RBC: 3.86 MIL/uL — AB (ref 4.22–5.81)
RDW: 15.1 % (ref 11.5–15.5)
WBC: 22.5 10*3/uL — AB (ref 4.0–10.5)

## 2013-11-01 LAB — COMPREHENSIVE METABOLIC PANEL
ALBUMIN: 2.4 g/dL — AB (ref 3.5–5.2)
ALT: 13 U/L (ref 0–53)
AST: 16 U/L (ref 0–37)
Alkaline Phosphatase: 70 U/L (ref 39–117)
BUN: 25 mg/dL — AB (ref 6–23)
CALCIUM: 8.7 mg/dL (ref 8.4–10.5)
CO2: 26 mEq/L (ref 19–32)
CREATININE: 1.55 mg/dL — AB (ref 0.50–1.35)
Chloride: 101 mEq/L (ref 96–112)
GFR calc Af Amer: 43 mL/min — ABNORMAL LOW (ref >90)
GFR calc non Af Amer: 37 mL/min — ABNORMAL LOW (ref >90)
Glucose, Bld: 48 mg/dL — ABNORMAL LOW (ref 70–99)
POTASSIUM: 3.3 meq/L — AB (ref 3.7–5.3)
Sodium: 143 mEq/L (ref 137–147)
Total Bilirubin: 0.6 mg/dL (ref 0.3–1.2)
Total Protein: 5.5 g/dL — ABNORMAL LOW (ref 6.0–8.3)

## 2013-11-01 LAB — GLUCOSE, CAPILLARY
Glucose-Capillary: 52 mg/dL — ABNORMAL LOW (ref 70–99)
Glucose-Capillary: 111 mg/dL — ABNORMAL HIGH (ref 70–99)
Glucose-Capillary: 121 mg/dL — ABNORMAL HIGH (ref 70–99)
Glucose-Capillary: 158 mg/dL — ABNORMAL HIGH (ref 70–99)
Glucose-Capillary: 151 mg/dL — ABNORMAL HIGH (ref 70–99)

## 2013-11-01 LAB — MAGNESIUM: Magnesium: 1.8 mg/dL (ref 1.5–2.5)

## 2013-11-01 LAB — CULTURE, RESPIRATORY W GRAM STAIN

## 2013-11-01 LAB — URIC ACID, RANDOM URINE: Uric Acid, Urine: 25.5 mg/dL

## 2013-11-01 LAB — CULTURE, RESPIRATORY

## 2013-11-01 MED ORDER — MAGNESIUM SULFATE 40 MG/ML IJ SOLN
2.0000 g | Freq: Once | INTRAMUSCULAR | Status: AC
  Administered 2013-11-01: 2 g via INTRAVENOUS
  Filled 2013-11-01: qty 50

## 2013-11-01 MED ORDER — METOPROLOL TARTRATE 1 MG/ML IV SOLN
5.0000 mg | Freq: Once | INTRAVENOUS | Status: AC
  Administered 2013-11-01: 5 mg via INTRAVENOUS
  Filled 2013-11-01: qty 5

## 2013-11-01 MED ORDER — DEXTROSE 50 % IV SOLN
25.0000 mL | Freq: Once | INTRAVENOUS | Status: AC | PRN
  Administered 2013-11-01: 25 mL via INTRAVENOUS
  Filled 2013-11-01: qty 50

## 2013-11-01 MED ORDER — POTASSIUM CHLORIDE 10 MEQ/100ML IV SOLN
10.0000 meq | INTRAVENOUS | Status: AC
  Administered 2013-11-01 (×4): 10 meq via INTRAVENOUS
  Filled 2013-11-01 (×4): qty 100

## 2013-11-01 NOTE — Progress Notes (Signed)
SLP Cancellation Note  Patient Details Name: [name] MRN: [id number] DOB: 1920/06/05   Cancelled treatment:       Reason Eval/Treat Not Completed:  (per MD note from 6/19, he will discuss with family re: their desires for nutrition and concern for ongoing aspiration.  Please review previous SLP notes re: pt's current swallow disability, SLP to follow up Monday 6/22 to see if can assist re: plan. Pls page if needed before [phone])   Claudie Fisherman, [facility] SLP [phone]

## 2013-11-01 NOTE — Progress Notes (Signed)
[place] TEAM 1 - Stepdown/ICU TEAM Progress Note  [name]:[id number] DOB: [birth year] DOA: 10/24/2013 PCP: [name], MD  Admit HPI / Brief Narrative: 80 y.o.WM PMHx HTN, HLD, DM, CAD, COPD, CKD,Tachy-brady syndrome  , A fib on NOAC, S/P implanted Pacer 04/2006(MDT Adapta ADDR01 dual chamber PPM, ser #: [id number]). Hx PE 12/2003, Recurrent DVT 2005/2009,  Pesented with lower R sided abdominal pain of 12hrs duration. Patient stated he had been nauseated but had not vomited. During his ED work up he was found to have a RVR and therefore was started on IV cardizem. Pt also reported productive cough, with yellow green sputum, wheezing, SOB, some fever, and chills. Of note, pt was hospitalized 6/7-6/8 after sustaining a mechanical fall at home and then being found to be suffering an "asthma exacerbation."  6/17 Pt was found to have urinary retention by his RN this morning. Attempts to place a foley cath were unsuccessful. I have asked the catheter team to place an 32F coude cath. The pt is quite pleasant, though confused. He is not aware of any urinary sx or abdom pressure per his report. He denies abdom pain.    HPI/Subjective: 6/20 A./O. x4, Negative CP/SOB. (not on home O2). Disappointed that his wife is feeling ill and could not make family meeting today to discuss goals of care  Assessment/Plan: PAF w/ acute RVR  -Currently rate controlled continue digoxin 0.125 mg daily -Continue Cardizem 360 mg daily  -Continue metoprolol  PO 25 MG  BID - old records suggest prior fear of previous lung toxicity on amio  RLL infiltrate/acute respiratory failure with hypoxia  -6/19 per speech evaluation; Pt continues to demonstrate immediate evidence of aspiration with thin liquids and delayed evidence of aspiration with nectar thick liquids, possibly also due to prior dx of esophageal dysphagia -tx for aspiration pneumonitis/pneumonia  -Continue budesonide nebulizer BID -Continue  ipratropium nebulizer QID -Continue Xopenex nebulizerQID -Solu-Medrol 60 mg daily -Continue Mucinex BID -Flutter valve q 4hr while awake - PCXR 6/19; stable  -Sputum culture pending   -Continue Zosyn; plan to tx for 7 day course  -6/20 out of bed to chair per shift  Aspiration -6/19 Spoke with patient and son [name] at length concerning his ongoing aspiration. [name] will bring his mother in order to have a discussion concerning nutrition going forward. -6/20 received call this a.m. that wife is ill and will not be able to make a family meeting until tomorrow. Informed patient  Abdominal pain with cholelithiasis  -CT abdom unrevealing - [place] noted 1.6 cm gallstone in gallbladder neck with significant sludge in gallbladder but no definite evidence of acute cholecystitis  - HIDA scan not c/w acute cholecystitis  - 6/20 pt denies abdom pain today   Acute urinary retention  -Catheter Team to placed a coude cath -Urine culture pending   UTI -Patient has moderate LE, large amount of hemoglobin in urine from 6/18 which would be consistent with UTI, awaiting cultures. Continue Zosyn -Urine sample also containing uric acid crystals, however patient states never had gout. However with his chronic urinary insufficiency need to consider uric acid stones/nephropathy. Obtain serum uric acid level, urine uric acid level, and urine creatinine.  COPD/chronic bronchitis w/ acute bronchospastic exacerbation  -See RLL infiltrate -6/19 PCXR stable  Hx of tachy/brady  -s/p pacer placement    DM uncontrolled with renal complications  -6/7 Hemoglobin A1c =9.3  -6/19 Increase Lantus to 25 units QHS -6/19 decrease D5 W. to 27ml/hr -Continue resistant SSI  CAD  -s/p DES to cx   Chronic grade 2 diastolic CHF with very mild systolic CHF (EF 53%)  -Monitor fluid intake closely, decrease D5 W. to 51ml/hr   Hx of DVT and PE  anticoag to continue w/ Xarelto   HTN  -Although not within ADA/AHA  guideline patient is 80 years old so will allow permissive HTN    CKD stage III  Baseline GFR 30-59 - crt appears to be at baseline   Hypernatremia  -6/20 continue D5 W. to 30ml/hr -Resolved continue to monitor  Hypokalemia -Potassium IV 10 mEq x4 runs -Potassium goal> 4  Hypomagnesemia -Magnesium IV 2 gm -Magnesium goal> 2    Code Status: FULL Family Communication: no family present at time of exam Disposition Plan: Resolution of pneumonia    Consultants: NA    Procedure/Significant Events: 6/12 CXR;Stable cardiomegaly. No acute cardiopulmonary disease. 6/12 CT abdomen pelvis with contrast; Stable gallstones within the gallbladder.Urinary bladder is moderately distended   6/12 ultrasound abdomen complete; Gallbladder Distended by sludge. 1.6 cm shadowing calculus a gallbladder neck. Upper normal gallbladder wall thickness. No definite pericholecystic fluid or sonographic Murphy sign. 6/13 HIDA scan;Patent cystic and common bile ducts. 6/15 PCXR;New increased density right lung base consistent with atelectasis or pneumonia. No CHF.    Culture 6/12 Blood Rt Arm/Hand NTD (final) 6/12 MRSA by PCR Negative 6/18 urine NTD  6/18 sputum positive rare gram-positive cocci in pairs     Antibiotics: Zosyn 6/12 >>6/13 + 6/15 >>   DVT prophylaxis: Xarelto    Devices Pacer   LINES / TUBES:  6/14 22ga right upper arm 6/14 22ga right forearm    Continuous Infusions: . sodium chloride 75 mL/hr at 10/26/13 1732  . dextrose 50 mL/hr at 11/01/13 0700    Objective: VITAL SIGNS: Temp: 97.7 F (36.5 C) (06/20 1448) Temp src: Oral (06/20 1448) BP: 152/84 mmHg (06/20 1448) Pulse Rate: 88 (06/20 1448) SPO2; FIO2:   Intake/Output Summary (Last 24 hours) at 11/01/13 1917 Last data filed at 11/01/13 1700  Gross per 24 hour  Intake   1673 ml  Output    401 ml  Net   1272 ml     Exam: General: A./O. x4, NAD, No acute respiratory distress Lungs: Clear to  auscultation bilateral , positive diffuse expiratory wheezing  Cardiovascular: Regular rate and rhythm without murmur gallop or rub normal S1 and S2 Abdomen: Nontender, nondistended, soft, bowel sounds positive, no rebound, no ascites, no appreciable mass Extremities: No significant cyanosis, clubbing, or edema bilateral lower extremities  Data Reviewed: Basic Metabolic Panel:  Recent Labs Lab 10/28/13 0230 10/29/13 0300 10/30/13 0354 10/30/13 1547 10/31/13 0500 11/01/13 0246  NA 146 147 152* 146 143 143  K 3.5* 4.0 3.5* 4.0 3.6* 3.3*  CL 107 111 110 109 104 101  CO2 21 22 29 23 27 26   GLUCOSE 248* 243* 175* 162* 355* 48*  BUN 18 19 16 17 20  25*  CREATININE 1.12 1.26 1.38* 1.31 1.43* 1.55*  CALCIUM 8.5 8.6 8.9 8.7 8.5 8.7  MG 1.8  --   --   --  1.7 1.8   Liver Function Tests:  Recent Labs Lab 10/26/13 0310 10/27/13 0334 10/31/13 0500 11/01/13 0246  AST 23 21 14 16   ALT 18 18 13 13   ALKPHOS 84 83 76 70  BILITOT 1.3* 1.0 0.7 0.6  PROT 6.2 6.1 5.2* 5.5*  ALBUMIN 3.0* 2.8* 2.3* 2.4*   No results found for this basename: LIPASE, AMYLASE,  in  the last 168 hours No results found for this basename: AMMONIA,  in the last 168 hours CBC:  Recent Labs Lab 10/27/13 0334 10/28/13 0230 10/30/13 0354 10/31/13 0500 11/01/13 0246  WBC 13.5* 12.0* 18.0* 17.4* 22.5*  NEUTROABS  --   --   --  16.7* 21.1*  HGB 14.6 12.4* 13.1 12.3* 11.9*  HCT 46.3 40.4 42.5 39.0 36.2*  MCV 98.3 97.8 100.2* 96.8 93.8  PLT 169 180 166 170 246   Cardiac Enzymes: No results found for this basename: CKTOTAL, CKMB, CKMBINDEX, TROPONINI,  in the last 168 hours BNP (last 3 results)  Recent Labs  02/08/13 0630 10/19/13 0915 10/31/13 0500  PROBNP 5645.0* 527.0* 4249.0*   CBG:  Recent Labs Lab 10/31/13 2129 11/01/13 0836 11/01/13 1023 11/01/13 1300 11/01/13 1624  GLUCAP 75 52* 111* 121* 158*    Recent Results (from the past 240 hour(s))  CULTURE, BLOOD (ROUTINE X 2)     Status: None     Collection Time    10/24/13  4:50 PM      Result Value Ref Range Status   Specimen Description BLOOD RIGHT ARM   Final   Special Requests BOTTLES DRAWN AEROBIC ONLY 10CC   Final   Culture  Setup Time     Final   Value: 10/24/2013 22:28     Performed at [facility]   Culture     Final   Value: NO GROWTH 5 DAYS     Performed at [facility]   Report Status 10/30/2013 FINAL   Final  CULTURE, BLOOD (ROUTINE X 2)     Status: None   Collection Time    10/24/13  5:00 PM      Result Value Ref Range Status   Specimen Description BLOOD RIGHT HAND   Final   Special Requests BOTTLES DRAWN AEROBIC AND ANAEROBIC 10CC   Final   Culture  Setup Time     Final   Value: 10/24/2013 22:26     Performed at [facility]   Culture     Final   Value: NO GROWTH 5 DAYS     Performed at [facility]   Report Status 10/30/2013 FINAL   Final  MRSA PCR SCREENING     Status: None   Collection Time    10/24/13  5:53 PM      Result Value Ref Range Status   MRSA by PCR NEGATIVE  NEGATIVE Final   Comment:            The GeneXpert MRSA Assay (FDA     approved for NASAL specimens     only), is one component of a     comprehensive MRSA colonization     surveillance program. It is not     intended to diagnose MRSA     infection nor to guide or     monitor treatment for     MRSA infections.  CULTURE, EXPECTORATED SPUTUM-ASSESSMENT     Status: None   Collection Time    10/30/13  1:40 PM      Result Value Ref Range Status   Specimen Description SPUTUM   Final   Special Requests Normal   Final   Sputum evaluation     Final   Value: THIS SPECIMEN IS ACCEPTABLE. RESPIRATORY CULTURE REPORT TO FOLLOW.   Report Status 10/30/2013 FINAL   Final  CULTURE, RESPIRATORY (NON-EXPECTORATED)     Status: None   Collection Time    10/30/13  1:40 PM      Result Value Ref Range Status   Specimen Description SPUTUM   Final   Special Requests NONE   Final   Gram Stain     Final   Value:  MODERATE WBC PRESENT,BOTH PMN AND MONONUCLEAR     FEW SQUAMOUS EPITHELIAL CELLS PRESENT     RARE GRAM POSITIVE COCCI     IN PAIRS RARE Y     Performed at [facility]   Culture     Final   Value: FEW CANDIDA ALBICANS     Performed at [facility]   Report Status 11/01/2013 FINAL   Final  URINE CULTURE     Status: None   Collection Time    10/30/13  2:41 PM      Result Value Ref Range Status   Specimen Description URINE, CATHETERIZED   Final   Special Requests NONE   Final   Culture  Setup Time     Final   Value: 10/30/2013 18:40     Performed at [facility]     Final   Value: NO GROWTH     Performed at [facility]   Culture     Final   Value: NO GROWTH     Performed at [facility]   Report Status 10/31/2013 FINAL   Final     Studies:  Recent x-ray studies have been reviewed in detail by the Attending Physician  Scheduled Meds:  Scheduled Meds: . antiseptic oral rinse  15 mL Mouth Rinse BID  . budesonide  0.25 mg Nebulization BID  . digoxin  0.125 mg Oral Daily  . diltiazem  360 mg Oral Daily  . furosemide  40 mg Oral Daily  . guaiFENesin  600 mg Oral BID  . hydrALAZINE  10 mg Oral 3 times per day  . insulin aspart  0-20 Units Subcutaneous TID WC  . insulin glargine  25 Units Subcutaneous QHS  . ipratropium  0.5 mg Nebulization QID  . levalbuterol  0.63 mg Nebulization BID  . methylPREDNISolone (SOLU-MEDROL) injection  60 mg Intravenous Daily  . metoprolol tartrate  25 mg Oral BID  . piperacillin-tazobactam (ZOSYN)  IV  3.375 g Intravenous 3 times per day  . Rivaroxaban  15 mg Oral Q supper  . sodium chloride  3 mL Intravenous Q12H    Time spent on care of this patient: 40 mins   Allie Bossier , MD   [facility] Office  [phone] Pager [phone]  On-Call/Text Page:      [email or web address]      password [email or web address]  If 7PM-7AM, please contact night-coverage [email or web address] Password  [email or web address] 11/01/2013, 7:17 PM   LOS: 8 days

## 2013-11-02 ENCOUNTER — Inpatient Hospital Stay (HOSPITAL_COMMUNITY): Payer: Medicare Other

## 2013-11-02 DIAGNOSIS — I509 Heart failure, unspecified: Secondary | ICD-10-CM

## 2013-11-02 DIAGNOSIS — I2699 Other pulmonary embolism without acute cor pulmonale: Secondary | ICD-10-CM

## 2013-11-02 DIAGNOSIS — I5042 Chronic combined systolic (congestive) and diastolic (congestive) heart failure: Secondary | ICD-10-CM

## 2013-11-02 LAB — CBC WITH DIFFERENTIAL/PLATELET
BASOS ABS: 0 10*3/uL (ref 0.0–0.1)
Basophils Relative: 0 % (ref 0–1)
EOS PCT: 0 % (ref 0–5)
Eosinophils Absolute: 0 10*3/uL (ref 0.0–0.7)
HCT: 41 % (ref 39.0–52.0)
Hemoglobin: 13.2 g/dL (ref 13.0–17.0)
Lymphocytes Relative: 9 % — ABNORMAL LOW (ref 12–46)
Lymphs Abs: 1.9 10*3/uL (ref 0.7–4.0)
MCH: 30.3 pg (ref 26.0–34.0)
MCHC: 32.2 g/dL (ref 30.0–36.0)
MCV: 94 fL (ref 78.0–100.0)
Monocytes Absolute: 0.5 10*3/uL (ref 0.1–1.0)
Monocytes Relative: 3 % (ref 3–12)
NEUTROS ABS: 18.2 10*3/uL — AB (ref 1.7–7.7)
Neutrophils Relative %: 88 % — ABNORMAL HIGH (ref 43–77)
PLATELETS: 239 10*3/uL (ref 150–400)
RBC: 4.36 MIL/uL (ref 4.22–5.81)
RDW: 15.1 % (ref 11.5–15.5)
WBC: 20.6 10*3/uL — ABNORMAL HIGH (ref 4.0–10.5)

## 2013-11-02 LAB — COMPREHENSIVE METABOLIC PANEL WITH GFR
ALT: 14 U/L (ref 0–53)
AST: 19 U/L (ref 0–37)
Albumin: 2.4 g/dL — ABNORMAL LOW (ref 3.5–5.2)
Alkaline Phosphatase: 83 U/L (ref 39–117)
BUN: 27 mg/dL — ABNORMAL HIGH (ref 6–23)
CO2: 25 meq/L (ref 19–32)
Calcium: 8.6 mg/dL (ref 8.4–10.5)
Chloride: 99 meq/L (ref 96–112)
Creatinine, Ser: 1.75 mg/dL — ABNORMAL HIGH (ref 0.50–1.35)
GFR calc Af Amer: 37 mL/min — ABNORMAL LOW
GFR calc non Af Amer: 32 mL/min — ABNORMAL LOW
Glucose, Bld: 180 mg/dL — ABNORMAL HIGH (ref 70–99)
Potassium: 3.6 meq/L — ABNORMAL LOW (ref 3.7–5.3)
Sodium: 140 meq/L (ref 137–147)
Total Bilirubin: 0.8 mg/dL (ref 0.3–1.2)
Total Protein: 5.5 g/dL — ABNORMAL LOW (ref 6.0–8.3)

## 2013-11-02 LAB — GLUCOSE, CAPILLARY
Glucose-Capillary: 149 mg/dL — ABNORMAL HIGH (ref 70–99)
Glucose-Capillary: 133 mg/dL — ABNORMAL HIGH (ref 70–99)
Glucose-Capillary: 171 mg/dL — ABNORMAL HIGH (ref 70–99)

## 2013-11-02 LAB — MAGNESIUM: MAGNESIUM: 2.3 mg/dL (ref 1.5–2.5)

## 2013-11-02 MED ORDER — POTASSIUM CHLORIDE 10 MEQ/100ML IV SOLN
10.0000 meq | INTRAVENOUS | Status: AC
  Administered 2013-11-02 (×3): 10 meq via INTRAVENOUS
  Filled 2013-11-02 (×3): qty 100

## 2013-11-02 MED ORDER — METOPROLOL TARTRATE 1 MG/ML IV SOLN
7.5000 mg | Freq: Four times a day (QID) | INTRAVENOUS | Status: DC
  Administered 2013-11-02 – 2013-11-05 (×13): 7.5 mg via INTRAVENOUS
  Filled 2013-11-02 (×18): qty 10

## 2013-11-02 NOTE — Progress Notes (Signed)
[name] TEAM 1 - Stepdown/ICU TEAM Progress Note  [name] CVU:[id number] DOB: [birth year] DOA: 10/24/2013 PCP: [name] Grant, MD  Admit HPI / Brief Narrative: 80 y.o.WM PMHx HTN, HLD, DM, CAD, COPD, CKD,Tachy-brady syndrome  , A fib on NOAC, S/P implanted Pacer 04/2006(MDT Adapta ADDR01 dual chamber PPM, ser #: [id number]). Hx PE 12/2003, Recurrent DVT 2005/2009,  Pesented with lower R sided abdominal pain of 12hrs duration. Patient stated he had been nauseated but had not vomited. During his ED work up he was found to have a RVR and therefore was started on IV cardizem. Pt also reported productive cough, with yellow green sputum, wheezing, SOB, some fever, and chills. Of note, pt was hospitalized 6/7-6/8 after sustaining a mechanical fall at home and then being found to be suffering an "asthma exacerbation."  6/17 Pt was found to have urinary retention by his RN this morning. Attempts to place a foley cath were unsuccessful. I have asked the catheter team to place an 24F coude cath. The pt is quite pleasant, though confused. He is not aware of any urinary sx or abdom pressure per his report. He denies abdom pain.    HPI/Subjective: 6/21 A./O. x4, Negative CP/SOB. (not on home O2). Wife and son at bedside to discuss plan of care going forward, having by mouth medication/nutrition accepting aspiration and its sequela (aspiration pneumonia/pneumonitis) or opting for placement of PEG tube which would reduce risk of aspiration.   Assessment/Plan: PAF w/ acute RVR  - old records suggest prior fear of previous lung toxicity on amio -Not really rate controlled on digoxin 0.125 mg daily as single agent -(Held) Cardizem 360 mg daily secondary to aspiration; awaiting outcome a family meeting -(Held ) metoprolol  PO 25 MG  BID secondary to aspiration; awaiting outcome family meeting - Start metoprolol IV 7.5 mg QID  RLL infiltrate/acute respiratory failure with hypoxia  -6/19 per speech  evaluation; Pt continues to demonstrate immediate evidence of aspiration with thin liquids and delayed evidence of aspiration with nectar thick liquids, possibly also due to prior dx of esophageal dysphagia -tx for aspiration pneumonitis/pneumonia  -Continue budesonide nebulizer BID -Continue ipratropium nebulizer QID -Continue Xopenex nebulizerQID -Discontinue Solu-Medrol 60 mg daily on 6/21 -Continue Mucinex BID -Flutter valve q 4hr while awake - PCXR 6/19; stable  -Sputum culture pending   -Continue Zosyn; plan to tx for 7 day course  -6/20 out of bed to chair per shift -6/21 patient appears close to baseline. Continue expiratory wheezing which is chronic, without rales/rhonchi  Aspiration -6/19 Spoke with patient and son [name] at length concerning his ongoing aspiration. [name] will bring his mother in order to have a discussion concerning nutrition going forward. -6/21 have discussed aspiration risk with patient, wife, and son [name] and they will discuss whether to accept risks of aspiration pneumonia and eventual death with PO nutrition and medication vs obtaining PEG  Placement; will await decision the patient and family   Abdominal pain with cholelithiasis  -CT abdom unrevealing - [place] noted 1.6 cm gallstone in gallbladder neck with significant sludge in gallbladder but no definite evidence of acute cholecystitis  - HIDA scan not c/w acute cholecystitis  - 6/21 pt denies abdom pain today   Acute urinary retention  -Catheter Team to placed a coude cath -Urine culture; negative   UTI - Continue Zosyn with stop date 6/22 -Urine sample also containing uric acid crystals, however patient states never had gout. However with his chronic urinary insufficiency need to consider uric  acid stones/nephropathy.  -Obtain renal ultrasound  COPD/chronic bronchitis w/ acute bronchospastic exacerbation  -See RLL infiltrate -6/19 PCXR stable  Hx of tachy/brady  -s/p pacer placement    DM  uncontrolled with renal complications  -6/7 Hemoglobin A1c =9.3  -6/19 Increase Lantus to 25 units QHS -Continue resistant SSI  CAD  -s/p DES to cx   Chronic grade 2 diastolic CHF with very mild systolic CHF (EF 41%)  -2/87 patient may becoming slightly fluid overloaded as evidenced by his increased proBNP, fluids to KVO - Lasix IV 40 mg x1   Hx of DVT and PE  anticoag to continue w/ Xarelto   HTN  -Although not within ADA/AHA guideline patient is 80 years old so will allow permissive HTN    CKD stage III  -Baseline GFR 30-59 - crt appears to be at baseline   Hypernatremia  -6/21 fluid to [facility] -Resolved continue to monitor  Hypokalemia -Potassium IV 10 mEq x3 runs -Potassium goal> 4  Hypomagnesemia -Magnesium goal> 2    Code Status: FULL Family Communication: no family present at time of exam Disposition Plan: Resolution of pneumonia    Consultants: NA    Procedure/Significant Events: 6/12 CXR;Stable cardiomegaly. No acute cardiopulmonary disease. 6/12 CT abdomen pelvis with contrast; Stable gallstones within the gallbladder.Urinary bladder is moderately distended   6/12 ultrasound abdomen complete; Gallbladder Distended by sludge. 1.6 cm shadowing calculus a gallbladder neck. Upper normal gallbladder wall thickness. No definite pericholecystic fluid or sonographic Murphy sign. 6/13 HIDA scan;Patent cystic and common bile ducts. 6/15 PCXR;New increased density right lung base consistent with atelectasis or pneumonia. No CHF.    Culture 6/12 Blood Rt Arm/Hand NTD (final) 6/12 MRSA by PCR Negative 6/18 urine NTD (final) 6/18 sputum positive rare gram-positive cocci in pairs (final)     Antibiotics: Zosyn 6/12 >>6/13 + 6/15 >> stopped date 6/22   DVT prophylaxis: Xarelto    Devices Pacer   LINES / TUBES:  6/14 22ga right upper arm 6/14 22ga right forearm    Continuous Infusions: . sodium chloride 75 mL/hr at 10/26/13 1732  . dextrose 50  mL/hr at 11/01/13 2000    Objective: VITAL SIGNS: Temp: 98.7 F (37.1 C) (06/21 0735) Temp src: Oral (06/21 0735) BP: 147/85 mmHg (06/21 0735) Pulse Rate: 124 (06/21 0735) SPO2; 98% on 2 L via [place] FIO2:   Intake/Output Summary (Last 24 hours) at 11/02/13 1020 Last data filed at 11/02/13 8676  Gross per 24 hour  Intake   1650 ml  Output    501 ml  Net   1149 ml     Exam: General: A./O. x4, NAD, No acute respiratory distress Lungs: Clear to auscultation bilateral , positive diffuse expiratory wheezing (per patient and son baseline) Cardiovascular: Regular rate and rhythm without murmur gallop or rub normal S1 and S2 Abdomen: Nontender, nondistended, soft, bowel sounds positive, no rebound, no ascites, no appreciable mass Extremities: No significant cyanosis, clubbing, or edema bilateral lower extremities  Data Reviewed: Basic Metabolic Panel:  Recent Labs Lab 10/28/13 0230  10/30/13 0354 10/30/13 1547 10/31/13 0500 11/01/13 0246 11/02/13 0212  NA 146  < > 152* 146 143 143 140  K 3.5*  < > 3.5* 4.0 3.6* 3.3* 3.6*  CL 107  < > 110 109 104 101 99  CO2 21  < > 29 23 27 26 25   GLUCOSE 248*  < > 175* 162* 355* 48* 180*  BUN 18  < > 16 17 20  25* 27*  CREATININE  1.12  < > 1.38* 1.31 1.43* 1.55* 1.75*  CALCIUM 8.5  < > 8.9 8.7 8.5 8.7 8.6  MG 1.8  --   --   --  1.7 1.8 2.3  < > = values in this interval not displayed. Liver Function Tests:  Recent Labs Lab 10/27/13 0334 10/31/13 0500 11/01/13 0246 11/02/13 0212  AST 21 14 16 19   ALT 18 13 13 14   ALKPHOS 83 76 70 83  BILITOT 1.0 0.7 0.6 0.8  PROT 6.1 5.2* 5.5* 5.5*  ALBUMIN 2.8* 2.3* 2.4* 2.4*   No results found for this basename: LIPASE, AMYLASE,  in the last 168 hours No results found for this basename: AMMONIA,  in the last 168 hours CBC:  Recent Labs Lab 10/28/13 0230 10/30/13 0354 10/31/13 0500 11/01/13 0246 11/02/13 0212  WBC 12.0* 18.0* 17.4* 22.5* 20.6*  NEUTROABS  --   --  16.7* 21.1* 18.2*    HGB 12.4* 13.1 12.3* 11.9* 13.2  HCT 40.4 42.5 39.0 36.2* 41.0  MCV 97.8 100.2* 96.8 93.8 94.0  PLT 180 166 170 246 239   Cardiac Enzymes: No results found for this basename: CKTOTAL, CKMB, CKMBINDEX, TROPONINI,  in the last 168 hours BNP (last 3 results)  Recent Labs  02/08/13 0630 10/19/13 0915 10/31/13 0500  PROBNP 5645.0* 527.0* 4249.0*   CBG:  Recent Labs Lab 11/01/13 1023 11/01/13 1300 11/01/13 1624 11/01/13 2141 11/02/13 0734  GLUCAP 111* 121* 158* 151* 149*    Recent Results (from the past 240 hour(s))  CULTURE, BLOOD (ROUTINE X 2)     Status: None   Collection Time    10/24/13  4:50 PM      Result Value Ref Range Status   Specimen Description BLOOD RIGHT ARM   Final   Special Requests BOTTLES DRAWN AEROBIC ONLY 10CC   Final   Culture  Setup Time     Final   Value: 10/24/2013 22:28     Performed at [facility]   Culture     Final   Value: NO GROWTH 5 DAYS     Performed at [facility]   Report Status 10/30/2013 FINAL   Final  CULTURE, BLOOD (ROUTINE X 2)     Status: None   Collection Time    10/24/13  5:00 PM      Result Value Ref Range Status   Specimen Description BLOOD RIGHT HAND   Final   Special Requests BOTTLES DRAWN AEROBIC AND ANAEROBIC 10CC   Final   Culture  Setup Time     Final   Value: 10/24/2013 22:26     Performed at [facility]   Culture     Final   Value: NO GROWTH 5 DAYS     Performed at [facility]   Report Status 10/30/2013 FINAL   Final  MRSA PCR SCREENING     Status: None   Collection Time    10/24/13  5:53 PM      Result Value Ref Range Status   MRSA by PCR NEGATIVE  NEGATIVE Final   Comment:            The GeneXpert MRSA Assay (FDA     approved for NASAL specimens     only), is one component of a     comprehensive MRSA colonization     surveillance program. It is not     intended to diagnose MRSA     infection nor to guide or  monitor treatment for     MRSA infections.   CULTURE, EXPECTORATED SPUTUM-ASSESSMENT     Status: None   Collection Time    10/30/13  1:40 PM      Result Value Ref Range Status   Specimen Description SPUTUM   Final   Special Requests Normal   Final   Sputum evaluation     Final   Value: THIS SPECIMEN IS ACCEPTABLE. RESPIRATORY CULTURE REPORT TO FOLLOW.   Report Status 10/30/2013 FINAL   Final  CULTURE, RESPIRATORY (NON-EXPECTORATED)     Status: None   Collection Time    10/30/13  1:40 PM      Result Value Ref Range Status   Specimen Description SPUTUM   Final   Special Requests NONE   Final   Gram Stain     Final   Value: MODERATE WBC PRESENT,BOTH PMN AND MONONUCLEAR     FEW SQUAMOUS EPITHELIAL CELLS PRESENT     RARE GRAM POSITIVE COCCI     IN PAIRS RARE Y     Performed at [facility]   Culture     Final   Value: FEW CANDIDA ALBICANS     Performed at [facility]   Report Status 11/01/2013 FINAL   Final  URINE CULTURE     Status: None   Collection Time    10/30/13  2:41 PM      Result Value Ref Range Status   Specimen Description URINE, CATHETERIZED   Final   Special Requests NONE   Final   Culture  Setup Time     Final   Value: 10/30/2013 18:40     Performed at [facility]     Final   Value: NO GROWTH     Performed at [facility]   Culture     Final   Value: NO GROWTH     Performed at [facility]   Report Status 10/31/2013 FINAL   Final     Studies:  Recent x-ray studies have been reviewed in detail by the Attending Physician  Scheduled Meds:  Scheduled Meds: . antiseptic oral rinse  15 mL Mouth Rinse BID  . budesonide  0.25 mg Nebulization BID  . digoxin  0.125 mg Oral Daily  . diltiazem  360 mg Oral Daily  . furosemide  40 mg Oral Daily  . guaiFENesin  600 mg Oral BID  . hydrALAZINE  10 mg Oral 3 times per day  . insulin aspart  0-20 Units Subcutaneous TID WC  . insulin glargine  25 Units Subcutaneous QHS  . ipratropium  0.5 mg Nebulization  QID  . levalbuterol  0.63 mg Nebulization BID  . methylPREDNISolone (SOLU-MEDROL) injection  60 mg Intravenous Daily  . metoprolol  7.5 mg Intravenous 4 times per day  . metoprolol tartrate  25 mg Oral BID  . piperacillin-tazobactam (ZOSYN)  IV  3.375 g Intravenous 3 times per day  . Rivaroxaban  15 mg Oral Q supper  . sodium chloride  3 mL Intravenous Q12H    Time spent on care of this patient: 40 mins   Allie Bossier , MD   [facility] Office  [phone] Pager [phone]  On-Call/Text Page:      [email or web address]      password [email or web address]  If 7PM-7AM, please contact night-coverage [email or web address] Password [email or web address] 11/02/2013, 10:20 AM   LOS: 9 days

## 2013-11-03 ENCOUNTER — Encounter (HOSPITAL_COMMUNITY): Payer: Self-pay | Admitting: Radiology

## 2013-11-03 LAB — COMPREHENSIVE METABOLIC PANEL
ALK PHOS: 79 U/L (ref 39–117)
ALT: 14 U/L (ref 0–53)
AST: 24 U/L (ref 0–37)
Albumin: 2.2 g/dL — ABNORMAL LOW (ref 3.5–5.2)
BUN: 31 mg/dL — AB (ref 6–23)
CO2: 26 meq/L (ref 19–32)
Calcium: 8.2 mg/dL — ABNORMAL LOW (ref 8.4–10.5)
Chloride: 98 mEq/L (ref 96–112)
Creatinine, Ser: 2.11 mg/dL — ABNORMAL HIGH (ref 0.50–1.35)
GFR, EST AFRICAN AMERICAN: 30 mL/min — AB (ref >90)
GFR, EST NON AFRICAN AMERICAN: 26 mL/min — AB (ref >90)
GLUCOSE: 195 mg/dL — AB (ref 70–99)
POTASSIUM: 4.4 meq/L (ref 3.7–5.3)
Sodium: 138 mEq/L (ref 137–147)
TOTAL PROTEIN: 5 g/dL — AB (ref 6.0–8.3)
Total Bilirubin: 0.9 mg/dL (ref 0.3–1.2)

## 2013-11-03 LAB — CBC WITH DIFFERENTIAL/PLATELET
Basophils Absolute: 0 10*3/uL (ref 0.0–0.1)
Basophils Relative: 0 % (ref 0–1)
Eosinophils Absolute: 0 10*3/uL (ref 0.0–0.7)
Eosinophils Relative: 0 % (ref 0–5)
HCT: 35.4 % — ABNORMAL LOW (ref 39.0–52.0)
Hemoglobin: 11.7 g/dL — ABNORMAL LOW (ref 13.0–17.0)
LYMPHS PCT: 3 % — AB (ref 12–46)
Lymphs Abs: 0.4 10*3/uL — ABNORMAL LOW (ref 0.7–4.0)
MCH: 31 pg (ref 26.0–34.0)
MCHC: 33.1 g/dL (ref 30.0–36.0)
MCV: 93.7 fL (ref 78.0–100.0)
Monocytes Absolute: 0.9 10*3/uL (ref 0.1–1.0)
Monocytes Relative: 5 % (ref 3–12)
NEUTROS PCT: 92 % — AB (ref 43–77)
Neutro Abs: 15.7 10*3/uL — ABNORMAL HIGH (ref 1.7–7.7)
Platelets: 223 10*3/uL (ref 150–400)
RBC: 3.78 MIL/uL — ABNORMAL LOW (ref 4.22–5.81)
RDW: 15 % (ref 11.5–15.5)
WBC: 17.1 10*3/uL — AB (ref 4.0–10.5)

## 2013-11-03 LAB — GLUCOSE, CAPILLARY
GLUCOSE-CAPILLARY: 60 mg/dL — AB (ref 70–99)
GLUCOSE-CAPILLARY: 85 mg/dL (ref 70–99)
Glucose-Capillary: 151 mg/dL — ABNORMAL HIGH (ref 70–99)
Glucose-Capillary: 110 mg/dL — ABNORMAL HIGH (ref 70–99)
Glucose-Capillary: 43 mg/dL — CL (ref 70–99)
Glucose-Capillary: 99 mg/dL (ref 70–99)

## 2013-11-03 LAB — MAGNESIUM: MAGNESIUM: 2.2 mg/dL (ref 1.5–2.5)

## 2013-11-03 MED ORDER — HYDRALAZINE HCL 20 MG/ML IJ SOLN
10.0000 mg | Freq: Three times a day (TID) | INTRAMUSCULAR | Status: DC
  Administered 2013-11-03 – 2013-11-05 (×6): 10 mg via INTRAVENOUS
  Filled 2013-11-03 (×3): qty 0.5
  Filled 2013-11-03: qty 1
  Filled 2013-11-03: qty 0.5
  Filled 2013-11-03: qty 1
  Filled 2013-11-03: qty 0.5
  Filled 2013-11-03: qty 1
  Filled 2013-11-03: qty 0.5

## 2013-11-03 MED ORDER — CEFAZOLIN SODIUM-DEXTROSE 2-3 GM-% IV SOLR
2.0000 g | INTRAVENOUS | Status: AC
  Administered 2013-11-04: 2 g via INTRAVENOUS
  Filled 2013-11-03: qty 50

## 2013-11-03 MED ORDER — DEXTROSE-NACL 5-0.45 % IV SOLN
INTRAVENOUS | Status: DC
  Administered 2013-11-03 – 2013-11-05 (×3): via INTRAVENOUS

## 2013-11-03 MED ORDER — INSULIN GLARGINE 100 UNIT/ML ~~LOC~~ SOLN
12.0000 [IU] | Freq: Every day | SUBCUTANEOUS | Status: DC
  Filled 2013-11-03 (×2): qty 0.12

## 2013-11-03 MED ORDER — DEXTROSE 50 % IV SOLN
INTRAVENOUS | Status: AC
  Administered 2013-11-03: 25 mL
  Filled 2013-11-03: qty 50

## 2013-11-03 MED ORDER — DEXTROSE 50 % IV SOLN
25.0000 mL | Freq: Once | INTRAVENOUS | Status: AC | PRN
  Administered 2013-11-03: 25 mL via INTRAVENOUS

## 2013-11-03 MED ORDER — DEXTROSE 50 % IV SOLN
INTRAVENOUS | Status: AC
  Filled 2013-11-03: qty 50

## 2013-11-03 NOTE — Consult Note (Signed)
Reason for Consult:Dysphagia, request for gastrostomy tube placement. Consulting Radiologist: Vernard Gambles Referring Physician: Thereasa Solo   HPI: [name] is an 80 y.o. male with complex medical history. He has currently been admitted with PAF with RVR as well as RLL infiltrate felt secondary to aspiration. He has failed swallow study and pt and family have agreed to pursue gastrostomy feeding tube. Chart, meds, labs, and imaging reviewed. IR is consulted for G-tube placement. Currently pt overall clinical status is improving. He has been on steroids up until yesterday. CXR has been stable, pt has been afebrile. WBC has been elevated but is slowly trending down. Was on Xarelto for anticoagulation, but has not received any since 6/19.  Past Medical History:  Past Medical History  Diagnosis Date  . Presence of permanent cardiac pacemaker 04/2006 upgrade    a. s/p MDT Adapta ADDR01 dual chamber PPM, ser #: [id number] H.  [name] PAF (paroxysmal atrial fibrillation)     a. previously on coumadin-->patient self-d/c'd 11/2012 b/c he was tired of having INR's checked.  . Tachy-brady syndrome   . DM (diabetes mellitus)   . CAD (coronary artery disease)     DES to cx  . Pulmonary embolism 12/2003  . Deep vein thrombophlebitis of leg 2005, 2009    recurrent when off anticoag  . HTN (hypertension)   . Allergic rhinitis   . Asthma   . CKD (chronic kidney disease), stage III   . Nasal polyp     Surgical History:  Past Surgical History  Procedure Laterality Date  . Cataract/lens implants    . Nasal polyp surgery    . Coronary angioplasty with stent placement  2008    DES to cx  . Pacemaker insertion  04/2006    Medtronic Adapta  . Cardioversion N/A 03/20/2013    Procedure: CARDIOVERSION;  Surgeon: Carlena Bjornstad, MD;  Location: [facility] ENDOSCOPY;  Service: Cardiovascular;  Laterality: N/A;    Family History:  Family History  Problem Relation Age of Onset  . Diabetes Neg Hx   . Hypertension Neg  Hx   . Coronary artery disease Neg Hx     Social History:  reports that he has never smoked. He does not have any smokeless tobacco history on file. He reports that he does not drink alcohol or use illicit drugs.  Allergies:  Allergies  Allergen Reactions  . Asa Buff (Mag [Buffered Aspirin] Nausea Only  . Ibuprofen     REACTION: causes nausea and vomitting    Medications: Current facility-administered medications:acetaminophen (TYLENOL) suppository 650 mg, 650 mg, Rectal, Q6H PRN, Kinnie Feil, MD;  acetaminophen (TYLENOL) tablet 650 mg, 650 mg, Oral, Q6H PRN, Kinnie Feil, MD;  antiseptic oral rinse (BIOTENE) solution 15 mL, 15 mL, Mouth Rinse, BID, Allie Bossier, MD, 15 mL at 11/03/13 1530 budesonide (PULMICORT) nebulizer solution 0.25 mg, 0.25 mg, Nebulization, BID, Cherene Altes, MD, 0.25 mg at 11/03/13 0900;  dextrose 5 %-0.45 % sodium chloride infusion, , Intravenous, Continuous, Cherene Altes, MD, Last Rate: 50 mL/hr at 11/03/13 1535;  digoxin (LANOXIN) tablet 0.125 mg, 0.125 mg, Oral, Daily, Cherene Altes, MD, 0.125 mg at 11/03/13 1531 diltiazem (CARDIZEM CD) 24 hr capsule 360 mg, 360 mg, Oral, Daily, Cherene Altes, MD, 360 mg at 11/03/13 1530;  hydrALAZINE (APRESOLINE) injection 10 mg, 10 mg, Intravenous, 3 times per day, Cherene Altes, MD, 10 mg at 11/03/13 1532;  insulin aspart (novoLOG) injection 0-20 Units, 0-20 Units, Subcutaneous, TID WC, Cherene Altes,  MD, 4 Units at 11/03/13 0828 insulin glargine (LANTUS) injection 12 Units, 12 Units, Subcutaneous, QHS, Cherene Altes, MD;  ipratropium (ATROVENT) nebulizer solution 0.5 mg, 0.5 mg, Nebulization, QID, Allie Bossier, MD, 0.5 mg at 11/03/13 1315;  levalbuterol (XOPENEX) nebulizer solution 0.63 mg, 0.63 mg, Nebulization, Q2H PRN, Cherene Altes, MD, 0.63 mg at 11/02/13 1152 levalbuterol (XOPENEX) nebulizer solution 0.63 mg, 0.63 mg, Nebulization, BID, Allie Bossier, MD, 0.63 mg at 11/03/13 0900;   LORazepam (ATIVAN) injection 0.5-1 mg, 0.5-1 mg, Intravenous, Q6H PRN, Cherene Altes, MD, 1 mg at 10/28/13 2143;  metoprolol (LOPRESSOR) injection 7.5 mg, 7.5 mg, Intravenous, 4 times per day, Allie Bossier, MD, 7.5 mg at 11/03/13 1443 morphine 2 MG/ML injection 2 mg, 2 mg, Intravenous, Q4H PRN, Kinnie Feil, MD, 2 mg at 11/03/13 0916;  ondansetron (ZOFRAN) injection 4 mg, 4 mg, Intravenous, Q6H PRN, Kinnie Feil, MD;  ondansetron (ZOFRAN) tablet 4 mg, 4 mg, Oral, Q6H PRN, Kinnie Feil, MD;  piperacillin-tazobactam (ZOSYN) IVPB 3.375 g, 3.375 g, Intravenous, 3 times per day, Cherene Altes, MD, 3.375 g at 11/03/13 [place], , Oral, PRN, Cherene Altes, MD;  sodium chloride 0.9 % injection 3 mL, 3 mL, Intravenous, Q12H, Kinnie Feil, MD, 3 mL at 11/02/13 2115  ROS: See HPI for pertinent findings, otherwise complete 10 system review negative.  Physical Exam: Blood pressure 143/62, pulse 91, temperature 97.5 F (36.4 C), temperature source Oral, resp. rate 29, height '5\' 7"'  (1.702 m), weight 172 lb 6.4 oz (78.2 kg), SpO2 98.00%. ENT: unremarkable airway Lungs: CTA without w/r/r Heart: Regular Abdomen: protuberant but soft, NT   Labs: CBC  Recent Labs  11/02/13 0212 11/03/13 0215  WBC 20.6* 17.1*  HGB 13.2 11.7*  HCT 41.0 35.4*  PLT 239 223   MET  Recent Labs  11/02/13 0212 11/03/13 0215  NA 140 138  K 3.6* 4.4  CL 99 98  CO2 25 26  GLUCOSE 180* 195*  BUN 27* 31*  CREATININE 1.75* 2.11*  CALCIUM 8.6 8.2*    Recent Labs  11/03/13 0215  PROT 5.0*  ALBUMIN 2.2*  AST 24  ALT 14  ALKPHOS 79  BILITOT 0.9   PT/INR No results found for this basename: LABPROT, INR,  in the last 72 hours ABG No results found for this basename: PHART, PCO2, PO2, HCO3,  in the last 72 hours    US Renal  11/03/2013   CLINICAL DATA:  Acute on chronic renal insufficiency. Uric acid stones/nephropathy.  EXAM: RENAL/URINARY TRACT ULTRASOUND  COMPLETE  COMPARISON:  10/24/2013  FINDINGS: Right Kidney:  Length: 11.1 cm. Renal sinus lipomatosis. Combined corticomedullary thinning.  Left Kidney:  Length: 11.0 cm. Renal sinus lipomatosis with corticomedullary thinning. Questionable shadowing hyperechogenicity along the left kidney lower pole, measuring up to 1.1 cm, but given the lack of any corroborating similar finding on prior imaging from 10/24/2013, most likely this is artifactual.  Bladder:  Foley catheter in the urinary bladder.  Other:  Incidental note is again made of gallstones.  IMPRESSION: 1. Bilateral corticomedullary thinning in both kidneys, with prominence of renal sinus adipose tissues suggesting renal sinus lipomatosis. 2. There is a suggestion of a shadowing hyperechogenicity inferiorly in the left kidney, but given that no stone was visible on recent prior ultrasound or CT this may well have been spurious/artifactual. 3. Incidentally observed chololithiasis.   Electronically Signed   By: Sherryl Barters M.D.   On: 11/03/2013 01:21  Assessment/Plan: Dysphagia, high risk aspiration For Perc G-tube placement Discussed procedure, risks, complications, use of sedation with pt and granddaughter. Pt is able to make own decisions and has signed consent. WBC trending down, recently on steroids, stopped yesterday, afebrile Has not received anticoagulation in past 2-3 days. Labs reviewed. Plan to proceed with G-tube tomorrow if pt remains stable.  Ascencion Dike PA-C 11/03/2013, 3:42 PM

## 2013-11-03 NOTE — Progress Notes (Signed)
TEAM 1 - Stepdown/ICU TEAM Progress Note  [name] CHE:[id number] DOB: September 03, 1920 DOA: 10/24/2013 PCP: [name], MD  Admit HPI / Brief Narrative: 80 y.o. male with PMH of HTN, HLD, DM, CAD, COPD, CKD, A fib on AC, and frequent falls who presented with lower R sided abdominal pain of 12hrs duration.  Patient stated he had been nauseated but had not vomited.  During his ED work up he was found to have a RVR and therefore was started on IV cardizem.  Pt also reported productive cough, with yellow green sputum, wheezing, SOB, some fever, and chills. Of note, pt was hospitalized 6/7-6/8 after sustaining a mechanical fall at home and then being found to be suffering an "asthma exacerbation."    HPI/Subjective: Pt is alert and conversant, though confused and with clear short term memory problems.  He defers to his children in the room to assist in making his medical decisions.  He denies cp, n/v, or abdom pain.   Assessment/Plan:  PAF w/ acute RVR  -old records suggest prior fear of previous lung toxicity on amio -resuming oral meds with sip of water until PEG placed to avoid RVR  RLL infiltrate/acute respiratory failure with hypoxia  -6/19 per speech evaluation; pt continues to demonstrate immediate evidence of aspiration with thin liquids and delayed evidence of aspiration with nectar thick liquids, possibly also due to prior dx of esophageal dysphagia -tx for aspiration pneumonitis/pneumonia  -Continue budesonide nebulizer BID -Continue ipratropium nebulizer QID -Continue Xopenex nebulizerQID -Discontinue Solu-Medrol 60 mg daily on 6/21 -Continue Mucinex BID -Flutter valve q 4hr while awake - PCXR 6/19; stable  -Sputum culture pending   -Continue Zosyn; plan to tx for 7 day course  -6/20 out of bed to chair per shift -6/21 patient appears close to baseline. Continue expiratory wheezing which is chronic, without rales/rhonchi  Aspiration -6/19 Spoke with  patient and son [name] at length concerning his ongoing aspiration. [name] will bring his mother in order to have a discussion concerning nutrition going forward. -6/21 have discussed aspiration risk with patient, wife, and son [name] and they will discuss whether to accept risks of aspiration pneumonia and eventual death with PO nutrition and medication vs obtaining PEG  Placement; will await decision the patient and family  -6/22 daughter and son at bedside state family has decided to pursue PEG placement - pt verbalized understanding and is willing to proceed - order placed - will hold Xarelto (has not gotten since 6/19)  Abdominal pain with cholelithiasis  -CT abdom unrevealing - [place] noted 1.6 cm gallstone in gallbladder neck with significant sludge in gallbladder but no definite evidence of acute cholecystitis  - HIDA scan not c/w acute cholecystitis  - pt denies further abdom pain  Acute urinary retention  -Catheter Team placed a coude cath -Urine culture; negative  -some hematuria today - follow   UTI -Zosyn with stop date 6/22 -Urine sample also containing uric acid crystals -renal ultrasound w/o acute findings   COPD/chronic bronchitis w/ acute bronchospastic exacerbation  -See RLL infiltrate -6/19 PCXR stable  Hx of tachy/brady  -s/p pacer placement    DM uncontrolled with renal complications  -CBG reasonably controlled, but with some hypoglycemia - decrease scheduled insulin and follow   CAD  -s/p DES to cx   Chronic grade 2 diastolic CHF with very mild systolic CHF (EF 53%)  -appears well compensated at this time   Hx of DVT and PE  anticoag to resume w/ Xarelto once  PEG placed (has not been given a dose since 6/19 per [facility])  HTN  -Although not within ADA/AHA guideline patient is 80 years old so will allow permissive HTN    CKD stage III  -Baseline GFR 30-59 - crt appears to be at baseline   Hypernatremia  -6/21 fluid to [facility] -Resolved continue to  monitor  Hypokalemia -Potassium IV 10 mEq x3 runs -Potassium goal> 4  Hypomagnesemia -Magnesium goal> 2    Code Status: FULL Family Communication: apoke at length w/ daughter, son, and son-in-law at bedside  Disposition Plan: SDU  Consultants: NA  Procedure/Significant Events: 6/12 CXR;Stable cardiomegaly. No acute cardiopulmonary disease. 6/12 CT abdomen pelvis with contrast; Stable gallstones within the gallbladder.Urinary bladder is moderately distended   6/12 ultrasound abdomen complete; Gallbladder Distended by sludge. 1.6 cm shadowing calculus a gallbladder neck. Upper normal gallbladder wall thickness. No definite pericholecystic fluid or sonographic Murphy sign. 6/13 HIDA scan;Patent cystic and common bile ducts. 6/15 PCXR;New increased density right lung base consistent with atelectasis or pneumonia. No CHF.  Culture 6/12 Blood Rt Arm/Hand NTD (final) 6/12 MRSA by PCR Negative 6/18 urine NTD (final) 6/18 sputum positive rare gram-positive cocci in pairs (final)  Antibiotics: Zosyn 6/12 >>6/13 + 6/15 >> 6/22  DVT prophylaxis: SCDs  Devices Pacer   LINES / TUBES:  6/14 22ga right upper arm 6/14 22ga right forearm  Objective: VITAL SIGNS: Temp: 97.8 F (36.6 C) (06/22 1551) Temp src: Oral (06/22 1551) BP: 159/83 mmHg (06/22 1551) Pulse Rate: 86 (06/22 1551) SPO2; 98% on 2 L via [place]  Intake/Output Summary (Last 24 hours) at 11/03/13 1914 Last data filed at 11/03/13 1842  Gross per 24 hour  Intake     93 ml  Output   1400 ml  Net  -1307 ml   Exam: General: No acute respiratory distress Lungs: Clear to auscultation bilateral  Cardiovascular: Regular rate and rhythm without murmur gallop or rub Abdomen: Nontender, nondistended, soft, bowel sounds positive, no rebound, no ascites, no appreciable mass Extremities: No significant cyanosis, clubbing, or edema bilateral lower extremities  Data Reviewed: Basic Metabolic Panel:  Recent Labs Lab  10/28/13 0230  10/30/13 1547 10/31/13 0500 11/01/13 0246 11/02/13 0212 11/03/13 0215  NA 146  < > 146 143 143 140 138  K 3.5*  < > 4.0 3.6* 3.3* 3.6* 4.4  CL 107  < > 109 104 101 99 98  CO2 21  < > 23 27 26 25 26   GLUCOSE 248*  < > 162* 355* 48* 180* 195*  BUN 18  < > 17 20 25* 27* 31*  CREATININE 1.12  < > 1.31 1.43* 1.55* 1.75* 2.11*  CALCIUM 8.5  < > 8.7 8.5 8.7 8.6 8.2*  MG 1.8  --   --  1.7 1.8 2.3 2.2  < > = values in this interval not displayed.  Liver Function Tests:  Recent Labs Lab 10/31/13 0500 11/01/13 0246 11/02/13 0212 11/03/13 0215  AST 14 16 19 24   ALT 13 13 14 14   ALKPHOS 76 70 83 79  BILITOT 0.7 0.6 0.8 0.9  PROT 5.2* 5.5* 5.5* 5.0*  ALBUMIN 2.3* 2.4* 2.4* 2.2*   CBC:  Recent Labs Lab 10/30/13 0354 10/31/13 0500 11/01/13 0246 11/02/13 0212 11/03/13 0215  WBC 18.0* 17.4* 22.5* 20.6* 17.1*  NEUTROABS  --  16.7* 21.1* 18.2* 15.7*  HGB 13.1 12.3* 11.9* 13.2 11.7*  HCT 42.5 39.0 36.2* 41.0 35.4*  MCV 100.2* 96.8 93.8 94.0 93.7  PLT 166 170  246 239 223   CBG:  Recent Labs Lab 11/02/13 2028 11/03/13 0733 11/03/13 1215 11/03/13 1330 11/03/13 1832  GLUCAP 171* 151* 60* 110* 85    Recent Results (from the past 240 hour(s))  CULTURE, EXPECTORATED SPUTUM-ASSESSMENT     Status: None   Collection Time    10/30/13  1:40 PM      Result Value Ref Range Status   Specimen Description SPUTUM   Final   Special Requests Normal   Final   Sputum evaluation     Final   Value: THIS SPECIMEN IS ACCEPTABLE. RESPIRATORY CULTURE REPORT TO FOLLOW.   Report Status 10/30/2013 FINAL   Final  CULTURE, RESPIRATORY (NON-EXPECTORATED)     Status: None   Collection Time    10/30/13  1:40 PM      Result Value Ref Range Status   Specimen Description SPUTUM   Final   Special Requests NONE   Final   Gram Stain     Final   Value: MODERATE WBC PRESENT,BOTH PMN AND MONONUCLEAR     FEW SQUAMOUS EPITHELIAL CELLS PRESENT     RARE GRAM POSITIVE COCCI     IN PAIRS RARE  Y     Performed at [facility]   Culture     Final   Value: FEW CANDIDA ALBICANS     Performed at [facility]   Report Status 11/01/2013 FINAL   Final  URINE CULTURE     Status: None   Collection Time    10/30/13  2:41 PM      Result Value Ref Range Status   Specimen Description URINE, CATHETERIZED   Final   Special Requests NONE   Final   Culture  Setup Time     Final   Value: 10/30/2013 18:40     Performed at [facility]     Final   Value: NO GROWTH     Performed at [facility]   Culture     Final   Value: NO GROWTH     Performed at [facility]   Report Status 10/31/2013 FINAL   Final     Studies:  Recent x-ray studies have been reviewed in detail by the Attending Physician  Scheduled Meds:  Scheduled Meds: . antiseptic oral rinse  15 mL Mouth Rinse BID  . budesonide  0.25 mg Nebulization BID  . [START ON 11/04/2013]  ceFAZolin (ANCEF) IV  2 g Intravenous On Call  . digoxin  0.125 mg Oral Daily  . diltiazem  360 mg Oral Daily  . hydrALAZINE  10 mg Intravenous 3 times per day  . insulin aspart  0-20 Units Subcutaneous TID WC  . insulin glargine  12 Units Subcutaneous QHS  . ipratropium  0.5 mg Nebulization QID  . levalbuterol  0.63 mg Nebulization BID  . metoprolol  7.5 mg Intravenous 4 times per day  . piperacillin-tazobactam (ZOSYN)  IV  3.375 g Intravenous 3 times per day  . sodium chloride  3 mL Intravenous Q12H   Time spent on care of this patient: 35 mins  Cherene Altes, MD [facility] For Consults/Admissions - Flow Manager - [phone] Office  [phone] Pager [phone]  On-Call/Text Page:      [email or web address]      password [facility]  11/03/2013, 7:14 PM   LOS: 10 days

## 2013-11-03 NOTE — Evaluation (Signed)
Occupational Therapy Evaluation Patient Details Name: [name] MRN: [id number] DOB: 1921/03/11 Today's Date: 11/03/2013    History of Present Illness Pt is a 80 y.o. male with pmh significant for HTN, DM, tachybrady syndrome, PE, asthma, GERD and CKD stage 3. He was admitted with A-fib with rapid ventricular response.   Clinical Impression   PT admitted with Afib with RVR. Pt currently with functional limitiations due to the deficits listed below (see OT problem list).  Pt will benefit from skilled OT to increase their independence and safety with adls and balance to allow discharge SNF. OT will follow acutely for basic transfers and toilet transfer.     Follow Up Recommendations  SNF;Supervision/Assistance - 24 hour    Equipment Recommendations  Wheelchair (measurements OT);Wheelchair cushion (measurements OT);3 in 1 bedside comode;Hospital bed    Recommendations for Other Services       Precautions / Restrictions Precautions Precautions: Fall Precaution Comments: 2 falls at home previously. Restrictions Weight Bearing Restrictions: No      Mobility Bed Mobility Overal bed mobility: Needs Assistance Bed Mobility: Sit to Supine     Supine to sit: Min assist Sit to supine: +2 for physical assistance;Mod assist   General bed mobility comments: Pt requires (A) with BIL LE and c/o back pain  Transfers Overall transfer level: Needs assistance Equipment used: Rolling walker (2 wheeled) Transfers: Sit to/from [facility] Sit to Stand: +2 physical assistance;Mod assist Stand pivot transfers: +2 physical assistance;Mod assist       General transfer comment: v/c for hand placment and anterior weight shift    Balance Overall balance assessment: History of Falls Sitting-balance support: Feet supported;Bilateral upper extremity supported Sitting balance-Leahy Scale: Fair     Standing balance support: Bilateral upper extremity supported;During  functional activity Standing balance-Leahy Scale: Poor                              ADL Overall ADL's : Needs assistance/impaired Eating/Feeding: NPO   Grooming: Set up;Sitting                   Toilet Transfer: +2 for physical assistance;Moderate assistance             General ADL Comments: pt reporting on OT arrival of pain at sacrum. pt noted to have dressing at sacrum. Pt completed sit<>Stand and noted to be incontinent of bowel. Pt returned to sitting and then completed sit<>stand again for peri care. Pt noted to have stage II wound on Right gluteal fold.       Vision                     Perception     Praxis      Pertinent Vitals/Pain VSS Pain at sacrum      Hand Dominance Right   Extremity/Trunk Assessment Upper Extremity Assessment Upper Extremity Assessment: Overall WFL for tasks assessed   Lower Extremity Assessment Lower Extremity Assessment: Defer to PT evaluation   Cervical / Trunk Assessment Cervical / Trunk Assessment: Kyphotic   Communication Communication Communication: HOH (wears hearing aide)   Cognition Arousal/Alertness: Awake/alert Behavior During Therapy: WFL for tasks assessed/performed Overall Cognitive Status: History of cognitive impairments - at baseline                     General Comments       Exercises       Shoulder Instructions  Home Living Family/patient expects to be discharged to:: Skilled nursing facility Living Arrangements: Spouse/significant other   Type of Home: House                           Additional Comments: children present at eval and requesting placement due to wife's inability to care for patient      Prior Functioning/Environment Level of Independence: Needs assistance  Gait / Transfers Assistance Needed: RW all the time ADL's / Homemaking Assistance Needed: Wife assists with bathing and dressing. Per family, she was doing "all of it".    Comments: Family present and reporting that it is too much on pt's wife to continue caring for him as she has been lately.     OT Diagnosis: Generalized weakness;Cognitive deficits;Acute pain   OT Problem List: Decreased strength;Decreased activity tolerance;Impaired balance (sitting and/or standing);Decreased safety awareness;Decreased knowledge of use of DME or AE;Decreased knowledge of precautions;Cardiopulmonary status limiting activity;Pain;Impaired UE functional use;Decreased cognition   OT Treatment/Interventions: Self-care/ADL training;Therapeutic exercise;DME and/or AE instruction;Therapeutic activities;Cognitive remediation/compensation;Patient/family education;Balance training    OT Goals(Current goals can be found in the care plan section) Acute Rehab OT Goals Patient Stated Goal: None stated OT Goal Formulation: Patient unable to participate in goal setting Time For Goal Achievement: 11/20/2013 Potential to Achieve Goals: Good  OT Frequency: Min 2X/week   Barriers to D/C:            Co-evaluation              End of Session Nurse Communication: Mobility status;Precautions  Activity Tolerance: Patient tolerated treatment well Patient left: in bed;with call bell/phone within reach;with nursing/sitter in room   Time: 1513-1535 OT Time Calculation (min): 22 min Charges:  OT General Charges $OT Visit: 1 Procedure OT Evaluation $Initial OT Evaluation Tier I: 1 Procedure OT Treatments $Self Care/Home Management : 8-22 mins G-Codes:    Peri Maris November 14, 2013, 3:58 PM Pager: [phone]

## 2013-11-03 NOTE — Evaluation (Signed)
Physical Therapy Evaluation Patient Details Name: [name] MRN: [id number] DOB: 01-09-1921 Today's Date: 11/03/2013   History of Present Illness  Pt is a 80 y.o. male with pmh significant for HTN, DM, tachybrady syndrome, PE, asthma, GERD and CKD stage 3. He was admitted with A-fib with rapid ventricular response.  Clinical Impression  Pt admitted with the above. Pt currently with functional limitations due to the deficits listed below (see PT Problem List). At the time of PT eval, pt was able to ambulate 20' at a time before requiring seated rest break. Fatigue limiting mobility. Pt will benefit from skilled PT to increase their independence and safety with mobility to allow discharge to the venue listed below.     Follow Up Recommendations SNF;Supervision/Assistance - 24 hour    Equipment Recommendations  None recommended by PT    Recommendations for Other Services       Precautions / Restrictions Precautions Precautions: Fall Precaution Comments: 2 falls at home previously. Restrictions Weight Bearing Restrictions: No      Mobility  Bed Mobility Overal bed mobility: Needs Assistance Bed Mobility: Supine to Sit     Supine to sit: Min assist     General bed mobility comments: Pt was able to move LE's off EOB and initiate transfer to sitting, however required min assist to complete transfer to full sitting position.   Transfers Overall transfer level: Needs assistance Equipment used: Rolling walker (2 wheeled) Transfers: Sit to/from Stand Sit to Stand: Min assist;+2 physical assistance         General transfer comment: VC's for hand placement on seated surface for safety. +2 assist to power-up to full standing position from bed in lowest position and recliner chair.   Ambulation/Gait Ambulation/Gait assistance: Min assist Ambulation Distance (Feet): 40 Feet (20'x2) Assistive device: Rolling walker (2 wheeled) Gait Pattern/deviations: Shuffle;Narrow base  of support;Trunk flexed Gait velocity: Decreased Gait velocity interpretation: Below normal speed for age/gender General Gait Details: VC's for improved posture and general safety awareness with the RW. Close chair follow required as pt fatigues quickly.  Stairs            Wheelchair Mobility    Modified Rankin (Stroke Patients Only)       Balance Overall balance assessment: History of Falls;Needs assistance Sitting-balance support: Feet supported;Bilateral upper extremity supported Sitting balance-Leahy Scale: Fair     Standing balance support: Bilateral upper extremity supported Standing balance-Leahy Scale: Poor                               Pertinent Vitals/Pain Pt on RA throughout ambulation. Sats at 86% however immediately improved upon seated rest break. Supplemental O2 donned again at end of session.     Home Living Family/patient expects to be discharged to:: Skilled nursing facility Living Arrangements: Spouse/significant other   Type of Home: House                Prior Function Level of Independence: Needs assistance   Gait / Transfers Assistance Needed: RW all the time  ADL's / Homemaking Assistance Needed: Wife assists with bathing and dressing. Per family, she was doing "all of it".  Comments: Family present and reporting that it is too much on pt's wife to continue caring for him as she has been lately.      Hand Dominance   Dominant Hand: Right    Extremity/Trunk Assessment   Upper Extremity Assessment: Defer to OT  evaluation           Lower Extremity Assessment: Generalized weakness      Cervical / Trunk Assessment: Kyphotic  Communication   Communication: HOH  Cognition Arousal/Alertness: Awake/alert Behavior During Therapy: WFL for tasks assessed/performed Overall Cognitive Status: History of cognitive impairments - at baseline                      General Comments      Exercises         Assessment/Plan    PT Assessment Patient needs continued PT services  PT Diagnosis Difficulty walking   PT Problem List Decreased strength;Decreased range of motion;Decreased activity tolerance;Decreased balance;Decreased mobility;Decreased knowledge of use of DME;Decreased safety awareness;Decreased knowledge of precautions;Cardiopulmonary status limiting activity  PT Treatment Interventions Gait training;Functional mobility training;Therapeutic activities;Therapeutic exercise;Patient/family education;DME instruction;Balance training   PT Goals (Current goals can be found in the Care Plan section) Acute Rehab PT Goals Patient Stated Goal: None stated PT Goal Formulation: With patient/family Time For Goal Achievement: 11/16/2013 Potential to Achieve Goals: Fair    Frequency Min 3X/week   Barriers to discharge Decreased caregiver support Per family, wife (who was not present) cannot care for him at home at this time as it is too much on her.     Co-evaluation               End of Session Equipment Utilized During Treatment: Gait belt;Oxygen Activity Tolerance: Patient limited by fatigue Patient left: in chair;with call bell/phone within reach;with family/visitor present Nurse Communication: Mobility status;Other (comment) (Family requesting meeting with MD)         Time: 4098-1191 PT Time Calculation (min): 33 min   Charges:   PT Evaluation $Initial PT Evaluation Tier I: 1 Procedure PT Treatments $Gait Training: 8-22 mins $Therapeutic Activity: 8-22 mins   PT G Codes:          Jolyn Lent 11/03/2013, 12:29 PM  Jolyn Lent, PT, DPT Acute Rehabilitation Services Pager: [phone]

## 2013-11-03 NOTE — Progress Notes (Signed)
**Note [name]-Identified via Obfuscation** Speech Language Pathology  Patient Details Name: [name] MRN: [id number] DOB: Oct 21, 1920 Today's Date: 11/03/2013 Time:  -    Spoke with RN who stated Dr. Thereasa Solo is ordering GI to see pt. For PEG per family wishes.  SLP will see next date; does pt/family also wish to continue po's or strict NPO with PEG?  [name] [place] [phone] 11/03/2013

## 2013-11-03 NOTE — Progress Notes (Signed)
Tech attempted to get CBG on Pt. Pt. continues to refused. RN Notify.

## 2013-11-03 NOTE — Progress Notes (Signed)
OT NOTE  Pt currently with skin break down on Rt gluteal fold and pink dressing removed with stage II wound present. Pt with second area demonstrating stage I in the generalized area. Pt repositioned in side lying for pressure relief. RN made aware of progression of skin break down. Recommend wound consult- if agree please order.   Wound care:  question using hydrocolloid dressing due to incontinence of stool at this time??  Pt could benefit from air mattress overlay ICU bed to prevent further skin break down.    Jeri Modena   OTR/L Pager: [phone] Office: [phone] .

## 2013-11-04 ENCOUNTER — Inpatient Hospital Stay (HOSPITAL_COMMUNITY): Payer: Medicare Other

## 2013-11-04 DIAGNOSIS — I82409 Acute embolism and thrombosis of unspecified deep veins of unspecified lower extremity: Secondary | ICD-10-CM

## 2013-11-04 LAB — COMPREHENSIVE METABOLIC PANEL
ALBUMIN: 2.3 g/dL — AB (ref 3.5–5.2)
ALT: 14 U/L (ref 0–53)
AST: 18 U/L (ref 0–37)
Alkaline Phosphatase: 80 U/L (ref 39–117)
BILIRUBIN TOTAL: 0.8 mg/dL (ref 0.3–1.2)
BUN: 30 mg/dL — AB (ref 6–23)
CHLORIDE: 101 meq/L (ref 96–112)
CO2: 27 mEq/L (ref 19–32)
CREATININE: 1.91 mg/dL — AB (ref 0.50–1.35)
Calcium: 8.8 mg/dL (ref 8.4–10.5)
GFR calc Af Amer: 34 mL/min — ABNORMAL LOW (ref >90)
GFR, EST NON AFRICAN AMERICAN: 29 mL/min — AB (ref >90)
GLUCOSE: 92 mg/dL (ref 70–99)
Potassium: 3.5 mEq/L — ABNORMAL LOW (ref 3.7–5.3)
Sodium: 142 mEq/L (ref 137–147)
Total Protein: 5.2 g/dL — ABNORMAL LOW (ref 6.0–8.3)

## 2013-11-04 LAB — GLUCOSE, CAPILLARY
Glucose-Capillary: 131 mg/dL — ABNORMAL HIGH (ref 70–99)
Glucose-Capillary: 136 mg/dL — ABNORMAL HIGH (ref 70–99)
Glucose-Capillary: 191 mg/dL — ABNORMAL HIGH (ref 70–99)
Glucose-Capillary: 87 mg/dL (ref 70–99)
Glucose-Capillary: 88 mg/dL (ref 70–99)
Glucose-Capillary: 98 mg/dL (ref 70–99)

## 2013-11-04 LAB — CBC WITH DIFFERENTIAL/PLATELET
BASOS ABS: 0 10*3/uL (ref 0.0–0.1)
Basophils Relative: 0 % (ref 0–1)
Eosinophils Absolute: 0.1 10*3/uL (ref 0.0–0.7)
Eosinophils Relative: 1 % (ref 0–5)
HCT: 39.7 % (ref 39.0–52.0)
Hemoglobin: 12.4 g/dL — ABNORMAL LOW (ref 13.0–17.0)
LYMPHS PCT: 8 % — AB (ref 12–46)
Lymphs Abs: 1.2 10*3/uL (ref 0.7–4.0)
MCH: 29.5 pg (ref 26.0–34.0)
MCHC: 31.2 g/dL (ref 30.0–36.0)
MCV: 94.5 fL (ref 78.0–100.0)
Monocytes Absolute: 1.2 10*3/uL — ABNORMAL HIGH (ref 0.1–1.0)
Monocytes Relative: 9 % (ref 3–12)
NEUTROS PCT: 82 % — AB (ref 43–77)
Neutro Abs: 11.8 10*3/uL — ABNORMAL HIGH (ref 1.7–7.7)
PLATELETS: 263 10*3/uL (ref 150–400)
RBC: 4.2 MIL/uL — ABNORMAL LOW (ref 4.22–5.81)
RDW: 15.4 % (ref 11.5–15.5)
WBC: 14.4 10*3/uL — AB (ref 4.0–10.5)

## 2013-11-04 LAB — PROTIME-INR
INR: 1.15 (ref 0.00–1.49)
PROTHROMBIN TIME: 14.5 s (ref 11.6–15.2)

## 2013-11-04 LAB — URINE CULTURE
CULTURE: NO GROWTH
Colony Count: NO GROWTH

## 2013-11-04 LAB — APTT: APTT: 30 s (ref 24–37)

## 2013-11-04 LAB — MAGNESIUM: MAGNESIUM: 2.1 mg/dL (ref 1.5–2.5)

## 2013-11-04 MED ORDER — MIDAZOLAM HCL 2 MG/2ML IJ SOLN
INTRAMUSCULAR | Status: AC
  Filled 2013-11-04: qty 2

## 2013-11-04 MED ORDER — IOHEXOL 300 MG/ML  SOLN: 50.0000 mL | Freq: Once | INTRAMUSCULAR | Status: AC | PRN

## 2013-11-04 MED ORDER — CEFAZOLIN SODIUM-DEXTROSE 2-3 GM-% IV SOLR
INTRAVENOUS | Status: AC
  Filled 2013-11-04: qty 50

## 2013-11-04 MED ORDER — MIDAZOLAM HCL 2 MG/2ML IJ SOLN
INTRAMUSCULAR | Status: AC | PRN
  Administered 2013-11-04: 0.5 mg via INTRAVENOUS

## 2013-11-04 MED ORDER — POTASSIUM CHLORIDE 10 MEQ/100ML IV SOLN
10.0000 meq | INTRAVENOUS | Status: AC
  Administered 2013-11-04 (×4): 10 meq via INTRAVENOUS
  Filled 2013-11-04 (×4): qty 100

## 2013-11-04 MED ORDER — GLUCAGON HCL RDNA (DIAGNOSTIC) 1 MG IJ SOLR
INTRAMUSCULAR | Status: AC
  Filled 2013-11-04: qty 1

## 2013-11-04 MED ORDER — POTASSIUM CHLORIDE 10 MEQ/100ML IV SOLN
10.0000 meq | INTRAVENOUS | Status: AC
  Administered 2013-11-04 – 2013-11-05 (×3): 10 meq via INTRAVENOUS
  Filled 2013-11-04 (×3): qty 100

## 2013-11-04 MED ORDER — INSULIN ASPART 100 UNIT/ML ~~LOC~~ SOLN
0.0000 [IU] | SUBCUTANEOUS | Status: DC
  Administered 2013-11-04 (×2): 1 [IU] via SUBCUTANEOUS
  Administered 2013-11-04: 2 [IU] via SUBCUTANEOUS
  Administered 2013-11-05 (×2): 3 [IU] via SUBCUTANEOUS
  Administered 2013-11-05: 1 [IU] via SUBCUTANEOUS
  Administered 2013-11-06: 2 [IU] via SUBCUTANEOUS
  Administered 2013-11-06: 5 [IU] via SUBCUTANEOUS
  Administered 2013-11-06 (×2): 2 [IU] via SUBCUTANEOUS

## 2013-11-04 MED ORDER — FENTANYL CITRATE 0.05 MG/ML IJ SOLN
INTRAMUSCULAR | Status: AC | PRN
Start: 2013-11-04 — End: 2013-11-04
  Administered 2013-11-04: 25 ug via INTRAVENOUS

## 2013-11-04 MED ORDER — GLUCAGON HCL (RDNA) 1 MG IJ SOLR
INTRAMUSCULAR | Status: AC | PRN
  Administered 2013-11-04: 1 mg via INTRAVENOUS

## 2013-11-04 MED ORDER — FENTANYL CITRATE 0.05 MG/ML IJ SOLN
INTRAMUSCULAR | Status: AC
  Filled 2013-11-04: qty 2

## 2013-11-04 MED ORDER — MIDAZOLAM HCL 2 MG/2ML IJ SOLN
INTRAMUSCULAR | Status: AC
  Filled 2013-11-04: qty 4

## 2013-11-04 MED ORDER — INSULIN GLARGINE 100 UNIT/ML ~~LOC~~ SOLN
10.0000 [IU] | Freq: Every day | SUBCUTANEOUS | Status: DC
  Administered 2013-11-04 – 2013-11-05 (×2): 10 [IU] via SUBCUTANEOUS
  Filled 2013-11-04 (×3): qty 0.1

## 2013-11-04 NOTE — Progress Notes (Signed)
Clinical Social Work Department BRIEF PSYCHOSOCIAL ASSESSMENT 11/04/2013  Patient:  [name], [name]     Account Number:  [id number]     Admit date:  10/24/2013  Clinical Social Worker:  [name]  Date/Time:  11/04/2013 11:14 AM  Referred by:  Physician  Date Referred:  11/04/2013 Referred for  SNF Placement  Advanced Directives   Other Referral:   Interview type:  Family Other interview type:    PSYCHOSOCIAL DATA Living Status:  FAMILY Admitted from facility:   Level of care:   Primary support name:  [name] [phone]) Primary support relationship to patient:  SPOUSE Degree of support available:   Good--pt has ample support from spouse, son-in-law, and adult children.    CURRENT CONCERNS Current Concerns  Post-Acute Placement   Other Concerns:    SOCIAL WORK ASSESSMENT / PLAN CSW spoke with pt's son and son-in-law; pt scheduled for PEG placement this morning. Provided son and son-in-law with list of facilities in [facility] and got permission to make referral to all facilities. Family is familiar with [facility]; sent this facility a message stating they have had family at their SNF before and mentioned them by name. Will provide bed offers after facilities respond to request. Son asked for power of attorney paperwork, but pt had gone to procedure when CSW came back to room. Will follow up with pt to ensure he is fully oriented and desires to fill this out himself.   Assessment/plan status:  Psychosocial Support/Ongoing Assessment of Needs Other assessment/ plan:   Information/referral to community resources:   SNF    PATIENT'S/FAMILY'S RESPONSE TO PLAN OF CARE: Good--pt's son and son-in-law understanding of CSW role in discharge planning.       [name], MSW, [facility] Clinical Social Worker [phone]

## 2013-11-04 NOTE — Progress Notes (Signed)
Orthopedic Tech Progress Note Patient Details:  BREWER HITCHMAN 11-19-1920 [id number]  Ortho Devices Type of Ortho Device: Abdominal binder Ortho Device/Splint Interventions: Application   Cammer, Theodoro Parma 11/04/2013, 4:44 PM

## 2013-11-04 NOTE — Progress Notes (Signed)
[place] TEAM 1 - Stepdown/ICU TEAM Progress Note  [name] XHB:[id number] DOB: 13-Nov-1920 DOA: 10/24/2013 PCP: [name], MD  Admit HPI / Brief Narrative: 80 y.o.WM PMHx HTN, HLD, DM, CAD, COPD, CKD,Tachy-brady syndrome  , A fib on NOAC, S/P implanted Pacer 04/2006(MDT Adapta ADDR01 dual chamber PPM, ser #: [id number]). Hx PE 12/2003, Recurrent DVT 2005/2009,  Pesented with lower R sided abdominal pain of 12hrs duration. Patient stated he had been nauseated but had not vomited. During his ED work up he was found to have a RVR and therefore was started on IV cardizem. Pt also reported productive cough, with yellow green sputum, wheezing, SOB, some fever, and chills. Of note, pt was hospitalized 6/7-6/8 after sustaining a mechanical fall at home and then being found to be suffering an "asthma exacerbation."  6/17 Pt was found to have urinary retention by his RN this morning. Attempts to place a foley cath were unsuccessful. I have asked the catheter team to place an 54F coude cath. The pt is quite pleasant, though confused. He is not aware of any urinary sx or abdom pressure per his report. He denies abdom pain.    HPI/Subjective: 6/23 A./O. x4, Negative CP/SOB. (not on home O2). Oldest son, and son-in-law at bedside. They verify that yesterday family and patient did decide to go with PEG tube placement    Assessment/Plan: PAF w/ acute RVR  - old records suggest prior fear of previous lung toxicity on amio -Not really rate controlled on digoxin 0.125 mg daily as single agent -(Held) Cardizem 360 mg daily secondary to aspiration; awaiting outcome a family meeting -(Held ) metoprolol  PO 25 MG  BID secondary to aspiration; awaiting outcome family meeting - Continue metoprolol IV 7.5 mg QID  RLL infiltrate/acute respiratory failure with hypoxia  -6/19 per speech evaluation; Pt continues to demonstrate immediate evidence of aspiration with thin liquids and delayed evidence of  aspiration with nectar thick liquids, possibly also due to prior dx of esophageal dysphagia -tx for aspiration pneumonitis/pneumonia  -Continue budesonide nebulizer BID -Continue ipratropium nebulizer QID -Continue Xopenex nebulizerQID -Discontinue Solu-Medrol 60 mg daily on 6/21 -Continue Mucinex BID -Flutter valve q 4hr while awake - PCXR 6/19; stable  -Sputum culture pending   -Complete course Zosyn -6/23 Continued expiratory wheezing which is chronic, without rales/rhonchi  Aspiration -6/19 Spoke with patient and son [name] at length concerning his ongoing aspiration. [name] will bring his mother in order to have a discussion concerning nutrition going forward. -6/23 patient and family have decided to go with PEG tube option. PEG tube will be placed prior to lunch today   Abdominal pain with cholelithiasis  -CT abdom unrevealing - [place] noted 1.6 cm gallstone in gallbladder neck with significant sludge in gallbladder but no definite evidence of acute cholecystitis  - HIDA scan not c/w acute cholecystitis  - 6/23 pt denies abdom pain today   Acute urinary retention  -Catheter Team to placed a coude cath -Urine culture; negative   UTI - Course of Zosyn completed stopped on 6/22 -Urine sample also containing uric acid crystals, however patient states never had gout. However with his chronic urinary insufficiency need to consider uric acid stones/nephropathy.  -Renal ultrasound nondiagnostic, would continue to monitor status and if worsening consider nephrology consult  COPD/chronic bronchitis w/ acute bronchospastic exacerbation  -See RLL infiltrate -6/19 PCXR stable  Hx of tachy/brady  -s/p pacer placement    DM uncontrolled with renal complications  -6/7 Hemoglobin A1c =9.3  -6/19  Increase Lantus to 25 units QHS -Decrease Lantus to 10 units daily  -Continue sensitive SSI   CAD  -s/p DES to cx   Chronic grade 2 diastolic CHF with very mild systolic CHF (EF 57%)    -appears well compensated at this time   Hx of DVT and PE  anticoag to resume w/ Xarelto once PEG placed (has not been given a dose since 6/19 per [facility])  HTN  -Although not within ADA/AHA guideline patient is 80 years old so will allow permissive HTN    CKD stage III  -Baseline GFR 30-59 - crt appears to be at baseline   Hypernatremia  -6/21 fluid to [facility] -Resolved continue to monitor  Hypokalemia -Potassium IV 10 mEq x3 runs -Potassium goal> 4  Hypomagnesemia -Magnesium goal> 2    Code Status: FULL Family Communication: Son and son-in-law present  Disposition Plan: Resolution of pneumonia    Consultants: NA    Procedure/Significant Events: 6/12 CXR;Stable cardiomegaly. No acute cardiopulmonary disease. 6/12 CT abdomen pelvis with contrast; Stable gallstones within the gallbladder.Urinary bladder is moderately distended   6/12 ultrasound abdomen complete; Gallbladder Distended by sludge. 1.6 cm shadowing calculus a gallbladder neck. Upper normal gallbladder wall thickness. No definite pericholecystic fluid or sonographic Murphy sign. 6/13 HIDA scan;Patent cystic and common bile ducts. 6/15 PCXR;New increased density right lung base consistent with atelectasis or pneumonia. No CHF. 6/22 renal ultrasound; Bilateral corticomedullary thinning in both kidneys, with prominence of renal sinus adipose tissues suggesting renal sinus lipomatosis.  - Incidentally observed chololithiasis.    Culture 6/12 Blood Rt Arm/Hand NTD (final) 6/12 MRSA by PCR Negative 6/18 urine NTD (final) 6/18 sputum positive rare gram-positive cocci in pairs (final)     Antibiotics: Zosyn 6/12 >>6/13 + 6/15 >> stopped date 6/22   DVT prophylaxis: Xarelto    Devices Pacer   LINES / TUBES:  6/14 22ga right upper arm 6/14 22ga right forearm    Continuous Infusions: . dextrose 5 % and 0.45% NaCl 50 mL/hr at 11/04/13 2000    Objective: VITAL SIGNS: Temp: 98.2 F (36.8 C) (06/23  1900) Temp src: Axillary (06/23 1900) BP: 134/74 mmHg (06/23 2000) Pulse Rate: 129 (06/23 2000) SPO2; 98% on 2 L via [place] FIO2:   Intake/Output Summary (Last 24 hours) at 11/04/13 2032 Last data filed at 11/04/13 1933  Gross per 24 hour  Intake 1670.83 ml  Output    900 ml  Net 770.83 ml     Exam: General: A./O. x4, NAD, No acute respiratory distress Lungs: Clear to auscultation bilateral , positive diffuse expiratory wheezing  Cardiovascular: Regular rate and rhythm without murmur gallop or rub normal S1 and S2 Abdomen: Nontender, nondistended, soft, bowel sounds positive, no rebound, no ascites, no appreciable mass Extremities: No significant cyanosis, clubbing, or edema bilateral lower extremities  Data Reviewed: Basic Metabolic Panel:  Recent Labs Lab 10/31/13 0500 11/01/13 0246 11/02/13 0212 11/03/13 0215 11/04/13 0319  NA 143 143 140 138 142  K 3.6* 3.3* 3.6* 4.4 3.5*  CL 104 101 99 98 101  CO2 27 26 25 26 27   GLUCOSE 355* 48* 180* 195* 92  BUN 20 25* 27* 31* 30*  CREATININE 1.43* 1.55* 1.75* 2.11* 1.91*  CALCIUM 8.5 8.7 8.6 8.2* 8.8  MG 1.7 1.8 2.3 2.2 2.1   Liver Function Tests:  Recent Labs Lab 10/31/13 0500 11/01/13 0246 11/02/13 0212 11/03/13 0215 11/04/13 0319  AST 14 16 19 24 18   ALT 13 13 14 14 14   ALKPHOS  76 70 83 79 80  BILITOT 0.7 0.6 0.8 0.9 0.8  PROT 5.2* 5.5* 5.5* 5.0* 5.2*  ALBUMIN 2.3* 2.4* 2.4* 2.2* 2.3*   No results found for this basename: LIPASE, AMYLASE,  in the last 168 hours No results found for this basename: AMMONIA,  in the last 168 hours CBC:  Recent Labs Lab 10/31/13 0500 11/01/13 0246 11/02/13 0212 11/03/13 0215 11/04/13 0319  WBC 17.4* 22.5* 20.6* 17.1* 14.4*  NEUTROABS 16.7* 21.1* 18.2* 15.7* 11.8*  HGB 12.3* 11.9* 13.2 11.7* 12.4*  HCT 39.0 36.2* 41.0 35.4* 39.7  MCV 96.8 93.8 94.0 93.7 94.5  PLT 170 246 239 223 263   Cardiac Enzymes: No results found for this basename: CKTOTAL, CKMB, CKMBINDEX,  TROPONINI,  in the last 168 hours BNP (last 3 results)  Recent Labs  02/08/13 0630 10/19/13 0915 10/31/13 0500  PROBNP 5645.0* 527.0* 4249.0*   CBG:  Recent Labs Lab 11/04/13 0019 11/04/13 0450 11/04/13 0728 11/04/13 1227 11/04/13 1600  GLUCAP 88 87 98 191* 131*    Recent Results (from the past 240 hour(s))  CULTURE, EXPECTORATED SPUTUM-ASSESSMENT     Status: None   Collection Time    10/30/13  1:40 PM      Result Value Ref Range Status   Specimen Description SPUTUM   Final   Special Requests Normal   Final   Sputum evaluation     Final   Value: THIS SPECIMEN IS ACCEPTABLE. RESPIRATORY CULTURE REPORT TO FOLLOW.   Report Status 10/30/2013 FINAL   Final  CULTURE, RESPIRATORY (NON-EXPECTORATED)     Status: None   Collection Time    10/30/13  1:40 PM      Result Value Ref Range Status   Specimen Description SPUTUM   Final   Special Requests NONE   Final   Gram Stain     Final   Value: MODERATE WBC PRESENT,BOTH PMN AND MONONUCLEAR     FEW SQUAMOUS EPITHELIAL CELLS PRESENT     RARE GRAM POSITIVE COCCI     IN PAIRS RARE Y     Performed at [facility]   Culture     Final   Value: FEW CANDIDA ALBICANS     Performed at [facility]   Report Status 11/01/2013 FINAL   Final  URINE CULTURE     Status: None   Collection Time    10/30/13  2:41 PM      Result Value Ref Range Status   Specimen Description URINE, CATHETERIZED   Final   Special Requests NONE   Final   Culture  Setup Time     Final   Value: 10/30/2013 18:40     Performed at [facility]     Final   Value: NO GROWTH     Performed at [facility]   Culture     Final   Value: NO GROWTH     Performed at [facility]   Report Status 10/31/2013 FINAL   Final  URINE CULTURE     Status: None   Collection Time    11/03/13  5:01 AM      Result Value Ref Range Status   Specimen Description URINE, CATHETERIZED   Final   Special Requests NONE   Final    Culture  Setup Time     Final   Value: 11/03/2013 05:14     Performed at [facility]  Final   Value: NO GROWTH     Performed at [facility]   Culture     Final   Value: NO GROWTH     Performed at [facility]   Report Status 11/04/2013 FINAL   Final     Studies:  Recent x-ray studies have been reviewed in detail by the Attending Physician  Scheduled Meds:  Scheduled Meds: . antiseptic oral rinse  15 mL Mouth Rinse BID  . budesonide  0.25 mg Nebulization BID  . ceFAZolin      . digoxin  0.125 mg Oral Daily  . diltiazem  360 mg Oral Daily  . fentaNYL      . glucagon (human recombinant)      . hydrALAZINE  10 mg Intravenous 3 times per day  . insulin aspart  0-9 Units Subcutaneous 6 times per day  . insulin glargine  12 Units Subcutaneous QHS  . ipratropium  0.5 mg Nebulization QID  . levalbuterol  0.63 mg Nebulization BID  . metoprolol  7.5 mg Intravenous 4 times per day  . midazolam      . sodium chloride  3 mL Intravenous Q12H    Time spent on care of this patient: 40 mins   Allie Bossier , MD   [facility] Office  [phone] Pager [phone]  On-Call/Text Page:      [email or web address]      password [email or web address]  If 7PM-7AM, please contact night-coverage [email or web address] Password [facility] 11/04/2013, 8:32 PM   LOS: 11 days

## 2013-11-04 NOTE — Procedures (Signed)
Successful 20 fr gtube No comp Full report in PACS

## 2013-11-04 NOTE — Progress Notes (Signed)
Inpatient Diabetes Program Recommendations  AACE/ADA: New Consensus Statement on Inpatient Glycemic Control (2013)  Target Ranges:  Prepandial:   less than 140 mg/dL      Peak postprandial:   less than 180 mg/dL (1-2 hours)      Critically ill patients:  140 - 180 mg/dL   Results for PEPE, MINEAU (MRN [id number]) as of 11/04/2013 11:40  Ref. Range 11/03/2013 07:33 11/03/2013 12:15 11/03/2013 13:30 11/03/2013 18:32 11/03/2013 20:44 11/03/2013 22:29 11/04/2013 00:19 11/04/2013 04:50 11/04/2013 07:28  Glucose-Capillary Latest Range: 70-99 mg/dL 151 (H) 60 (L) 110 (H) 85 43 (LL) 99 88 87 98   Diabetes history: DM2 Outpatient Diabetes medications: Glipizide 7.5 mg QAM Current orders for Inpatient glycemic control: Lantus 12 units QHS, Novolog 0-9 units Q4H  Inpatient Diabetes Program Recommendations Insulin - Basal: Please consider discontinuing Lantus for now and consider restarting if CBGs become consistently elevated > 180 mg/dl.  Note: In reviewing the chart, patient's blood glucose noted to be low twice on 6/22 and fasting is 98 mg/dl this morning. Noted patient last received Lantus 25 units on 6/21 and NO Lantus was given on 6/22 (RN noted held due to glucose of 43 mg/dl at 8:44 pm last night). Also noted steroids have been discontinued. Please consider discontinuing Lantus for now and consider restarting low dose Lantus if CBGs are consistently elevated > 180 mg/dl.    Thanks, Barnie Alderman, RN, MSN, CCRN Diabetes Coordinator Inpatient Diabetes Program [phone] (Team Pager) [phone] ([facility] office) [phone] [facility] office)

## 2013-11-04 NOTE — Progress Notes (Signed)
SLP Cancellation Note  Patient Details Name: D'ARCY ABRAHA MRN: [id number] DOB: 1920-12-27   Cancelled treatment:       Reason Eval/Treat Not Completed: Patient at procedure or test/unavailable. Pt going for PEG today per orders. Will f/u after PEG placement for discussion with family regarding how to proceed with swallowing. Does pt/family want to attempt certain POs for pleasure?    DeBlois, Katherene Ponto 11/04/2013, 9:53 AM

## 2013-11-04 NOTE — Progress Notes (Addendum)
Clinical Social Work Department CLINICAL SOCIAL WORK PLACEMENT NOTE 11/04/2013  Patient:  [name], [name]  Account Number:  [id number] Admit date:  10/24/2013  Clinical Social Worker:  Ky Barban, Latanya Presser  Date/time:  11/04/2013 11:17 AM  Clinical Social Work is seeking post-discharge placement for this patient at the following level of care:   SKILLED NURSING   (*CSW will update this form in Epic as items are completed)   11/04/2013  Patient/family provided with [place] Department of Clinical Social Work's list of facilities offering this level of care within the geographic area requested by the patient (or if unable, by the patient's family).  11/04/2013  Patient/family informed of their freedom to choose among providers that offer the needed level of care, that participate in Medicare, Medicaid or managed care program needed by the patient, have an available bed and are willing to accept the patient.  11/04/2013  Patient/family informed of [facility]' ownership interest in [facility], as well as of the fact that they are under no obligation to receive care at this facility.  PASARR submitted to EDS on 11/04/2013 PASARR number received on 11/04/2013  FL2 transmitted to all facilities in geographic area requested by pt/family on  11/04/2013 FL2 transmitted to all facilities within larger geographic area on   Patient informed that his/her managed care company has contracts with or will negotiate with  certain facilities, including the following:     Patient/family informed of bed offers received:  11/05/13 Patient chooses bed at [facility] Physician recommends and patient chooses bed at    Patient to be transferred to [facility] on  11/06/13 Patient to be transferred to facility by [facility] Patient and family notified of transfer on  Name of family member notified:  Pt's son [name] in Advertising account executive message. CSW spoke on phone with pt's son Sonia Side and updated him of  discharge for this afternoon.  The following physician request were entered in Epic:   Additional Comments: CSW called pt's son [name], who lives with pt and pt's wife. Family has selected [facility] for SNF. CSW informed facility. Following for discharge to [facility] when pt is medically stable.   Ky Barban, MSW, [facility] Clinical Social Worker [phone]

## 2013-11-04 NOTE — Progress Notes (Signed)
Spoke with pt, explaining healthcare power of attorney paperwork and asking pt if he would like to complete this. Pt repeated "ask my brother what happened to me today," and would not answer question about healthcare power of attorney paperwork. CSW will follow up with pt, as he was unable to keep his eyes open during conversation and is visibly exhausted. Pt must be fully oriented and wish to fill out paperwork himself.   Ky Barban, MSW, [facility] Clinical Social Worker [phone]

## 2013-11-05 LAB — GLUCOSE, CAPILLARY
GLUCOSE-CAPILLARY: 118 mg/dL — AB (ref 70–99)
Glucose-Capillary: 123 mg/dL — ABNORMAL HIGH (ref 70–99)
Glucose-Capillary: 118 mg/dL — ABNORMAL HIGH (ref 70–99)
Glucose-Capillary: 161 mg/dL — ABNORMAL HIGH (ref 70–99)
Glucose-Capillary: 210 mg/dL — ABNORMAL HIGH (ref 70–99)
Glucose-Capillary: 249 mg/dL — ABNORMAL HIGH (ref 70–99)

## 2013-11-05 LAB — CBC WITH DIFFERENTIAL/PLATELET
BASOS PCT: 0 % (ref 0–1)
Basophils Absolute: 0 10*3/uL (ref 0.0–0.1)
EOS ABS: 0.4 10*3/uL (ref 0.0–0.7)
EOS PCT: 3 % (ref 0–5)
HCT: 38.7 % — ABNORMAL LOW (ref 39.0–52.0)
Hemoglobin: 12.3 g/dL — ABNORMAL LOW (ref 13.0–17.0)
LYMPHS ABS: 1.3 10*3/uL (ref 0.7–4.0)
Lymphocytes Relative: 9 % — ABNORMAL LOW (ref 12–46)
MCH: 30.6 pg (ref 26.0–34.0)
MCHC: 31.8 g/dL (ref 30.0–36.0)
MCV: 96.3 fL (ref 78.0–100.0)
Monocytes Absolute: 1.1 10*3/uL — ABNORMAL HIGH (ref 0.1–1.0)
Monocytes Relative: 8 % (ref 3–12)
NEUTROS PCT: 80 % — AB (ref 43–77)
Neutro Abs: 11 10*3/uL — ABNORMAL HIGH (ref 1.7–7.7)
PLATELETS: 272 10*3/uL (ref 150–400)
RBC: 4.02 MIL/uL — ABNORMAL LOW (ref 4.22–5.81)
RDW: 15.6 % — ABNORMAL HIGH (ref 11.5–15.5)
WBC: 13.8 10*3/uL — ABNORMAL HIGH (ref 4.0–10.5)

## 2013-11-05 LAB — MAGNESIUM: Magnesium: 2 mg/dL (ref 1.5–2.5)

## 2013-11-05 LAB — COMPREHENSIVE METABOLIC PANEL
ALBUMIN: 2.1 g/dL — AB (ref 3.5–5.2)
ALK PHOS: 83 U/L (ref 39–117)
ALT: 10 U/L (ref 0–53)
AST: 16 U/L (ref 0–37)
BUN: 27 mg/dL — ABNORMAL HIGH (ref 6–23)
CHLORIDE: 103 meq/L (ref 96–112)
CO2: 23 mEq/L (ref 19–32)
CREATININE: 1.63 mg/dL — AB (ref 0.50–1.35)
Calcium: 8.3 mg/dL — ABNORMAL LOW (ref 8.4–10.5)
GFR calc Af Amer: 41 mL/min — ABNORMAL LOW (ref >90)
GFR, EST NON AFRICAN AMERICAN: 35 mL/min — AB (ref >90)
Glucose, Bld: 114 mg/dL — ABNORMAL HIGH (ref 70–99)
POTASSIUM: 4.1 meq/L (ref 3.7–5.3)
SODIUM: 140 meq/L (ref 137–147)
Total Bilirubin: 0.7 mg/dL (ref 0.3–1.2)
Total Protein: 4.9 g/dL — ABNORMAL LOW (ref 6.0–8.3)

## 2013-11-05 MED ORDER — DIGOXIN 125 MCG PO TABS: 0.1250 mg | ORAL_TABLET | Freq: Every day | ORAL | Status: DC

## 2013-11-05 MED ORDER — ENOXAPARIN SODIUM 80 MG/0.8ML ~~LOC~~ SOLN
80.0000 mg | SUBCUTANEOUS | Status: DC
  Administered 2013-11-05: 80 mg via SUBCUTANEOUS
  Filled 2013-11-05 (×2): qty 0.8

## 2013-11-05 MED ORDER — GLUCERNA 1.2 CAL PO LIQD
1000.0000 mL | ORAL | Status: DC
  Filled 2013-11-05 (×2): qty 1000

## 2013-11-05 MED ORDER — HYDRALAZINE HCL 10 MG PO TABS
10.0000 mg | ORAL_TABLET | Freq: Three times a day (TID) | ORAL | Status: DC
  Administered 2013-11-05 – 2013-11-06 (×4): 10 mg
  Filled 2013-11-05 (×6): qty 1

## 2013-11-05 MED ORDER — OLOPATADINE HCL 0.1 % OP SOLN
1.0000 [drp] | Freq: Two times a day (BID) | OPHTHALMIC | Status: DC
  Administered 2013-11-05 – 2013-11-06 (×2): 1 [drp] via OPHTHALMIC
  Filled 2013-11-05: qty 5

## 2013-11-05 MED ORDER — DILTIAZEM 12 MG/ML ORAL SUSPENSION
90.0000 mg | Freq: Four times a day (QID) | ORAL | Status: DC
  Administered 2013-11-05 – 2013-11-06 (×5): 90 mg
  Filled 2013-11-05 (×9): qty 9

## 2013-11-05 MED ORDER — GLUCERNA 1.2 CAL PO LIQD
1000.0000 mL | ORAL | Status: DC
  Administered 2013-11-05 – 2013-11-06 (×2): 1000 mL
  Filled 2013-11-05 (×5): qty 1000

## 2013-11-05 MED ORDER — DIGOXIN 0.05 MG/ML PO SOLN
0.1250 mg | Freq: Every day | ORAL | Status: DC
  Administered 2013-11-05 – 2013-11-06 (×2): 0.125 mg
  Filled 2013-11-05 (×2): qty 2.5

## 2013-11-05 MED ORDER — ACETAMINOPHEN 650 MG RE SUPP: 650.0000 mg | Freq: Four times a day (QID) | RECTAL | Status: DC | PRN

## 2013-11-05 MED ORDER — DILTIAZEM HCL 90 MG PO TABS: 90.0000 mg | ORAL_TABLET | Freq: Four times a day (QID) | ORAL | Status: DC

## 2013-11-05 MED ORDER — ACETAMINOPHEN 325 MG PO TABS
650.0000 mg | ORAL_TABLET | Freq: Four times a day (QID) | ORAL | Status: DC | PRN
  Administered 2013-11-06: 650 mg
  Filled 2013-11-05: qty 2

## 2013-11-05 MED ORDER — METOPROLOL TARTRATE 12.5 MG HALF TABLET
12.5000 mg | ORAL_TABLET | Freq: Two times a day (BID) | ORAL | Status: DC
  Administered 2013-11-05 – 2013-11-06 (×2): 12.5 mg
  Filled 2013-11-05 (×3): qty 1

## 2013-11-05 MED ORDER — LEVALBUTEROL HCL 0.63 MG/3ML IN NEBU
0.6300 mg | INHALATION_SOLUTION | Freq: Four times a day (QID) | RESPIRATORY_TRACT | Status: DC
  Administered 2013-11-05 – 2013-11-06 (×2): 0.63 mg via RESPIRATORY_TRACT
  Filled 2013-11-05 (×8): qty 3

## 2013-11-05 NOTE — Progress Notes (Signed)
ANTICOAGULATION CONSULT NOTE - Initial Consult  Pharmacy Consult for lovenox Indication: Hx DVT/PE  Allergies  Allergen Reactions  . Asa Buff (Mag [Buffered Aspirin] Nausea Only  . Ibuprofen     REACTION: causes nausea and vomitting    Patient Measurements: Height: '5\' 7"'  (170.2 cm) Weight: 172 lb 6.4 oz (78.2 kg) IBW/kg (Calculated) : 66.1  Vital Signs: Temp: 97.7 F (36.5 C) (06/24 1153) Temp src: Oral (06/24 1153) BP: 146/71 mmHg (06/24 1300) Pulse Rate: 129 (06/24 1300)  Labs:  Recent Labs  11/03/13 0215 11/04/13 0319 11/05/13 0258  HGB 11.7* 12.4* 12.3*  HCT 35.4* 39.7 38.7*  PLT 223 263 272  APTT  --  30  --   LABPROT  --  14.5  --   INR  --  1.15  --   CREATININE 2.11* 1.91* 1.63*    Estimated Creatinine Clearance: 27.6 ml/min (by C-G formula based on Cr of 1.63).   Medical History: Past Medical History  Diagnosis Date  . Presence of permanent cardiac pacemaker 04/2006 upgrade    a. s/p MDT Adapta ADDR01 dual chamber PPM, ser #: [id number] H.  [name] PAF (paroxysmal atrial fibrillation)     a. previously on coumadin-->patient self-d/c'd 11/2012 b/c he was tired of having INR's checked.  . Tachy-brady syndrome   . DM (diabetes mellitus)   . CAD (coronary artery disease)     DES to cx  . Pulmonary embolism 12/2003  . Deep vein thrombophlebitis of leg 2005, 2009    recurrent when off anticoag  . HTN (hypertension)   . Allergic rhinitis   . Asthma   . CKD (chronic kidney disease), stage III   . Nasal polyp     Medications:  Prescriptions prior to admission  Medication Sig Dispense Refill  . albuterol (PROVENTIL HFA;VENTOLIN HFA) 108 (90 BASE) MCG/ACT inhaler Inhale 2 puffs into the lungs every 4 (four) hours as needed for wheezing or shortness of breath.  1 Inhaler  0  . budesonide (PULMICORT) 0.25 MG/2ML nebulizer solution Take 0.25 mg by nebulization every 6 (six) hours as needed (shortness of breath). Use two times a day      . clonazePAM (KLONOPIN)  0.5 MG tablet Take 0.5 mg by mouth at bedtime as needed (for sleep).      [name] diltiazem (CARDIZEM CD) 360 MG 24 hr capsule Take 1 capsule (360 mg total) by mouth daily.  90 capsule  3  . furosemide (LASIX) 40 MG tablet Take 1 tablet (40 mg total) by mouth daily.  90 tablet  1  . glipiZIDE (GLUCOTROL) 5 MG tablet Take 1.5 tablets (7.5 mg total) by mouth daily before breakfast.  45 tablet  11  . guaiFENesin (MUCINEX) 600 MG 12 hr tablet Take 1 tablet (600 mg total) by mouth 2 (two) times daily.  14 tablet  0  . HYDROcodone-acetaminophen (NORCO/VICODIN) 5-325 MG per tablet Take 0.5-1 tablets by mouth every 8 (eight) hours as needed for moderate pain.      [name] ipratropium (ATROVENT) 0.02 % nebulizer solution Take 0.5 mg by nebulization 4 (four) times daily - after meals and at bedtime.      . levalbuterol (XOPENEX) 0.63 MG/3ML nebulizer solution Take 3 mLs (0.63 mg total) by nebulization every 6 (six) hours as needed for wheezing or shortness of breath.  30 mL  0  . metoprolol tartrate (LOPRESSOR) 25 MG tablet Take 1 tablet (25 mg total) by mouth 2 (two) times daily.  60 tablet  0  .  Multiple Vitamins-Iron (QC DAILY MULTIVITAMINS/IRON) TABS Take 1 tablet by mouth daily.        . nitroGLYCERIN (NITROSTAT) 0.3 MG SL tablet Place 0.3 mg under the tongue every 5 (five) minutes as needed for chest pain.      . Rivaroxaban (XARELTO) 15 MG TABS tablet Take 1 tablet (15 mg total) by mouth daily with supper.  30 tablet  6  . Blood Glucose Monitoring Suppl (ONE TOUCH ULTRA 2) W/DEVICE KIT Use to check blood sugars daily Dx 250.00  1 each  0  . glucose blood (ONE TOUCH ULTRA TEST) test strip Use to check blood sugars before breakfast Dx 250.00  30 each  11  . ONE TOUCH LANCETS MISC Use to help check blood sugars before breakfast Dx 250.00  30 each  11    Assessment: 89 yom on chronic xarelto for hx of PE and DVT admitted with back pain, SOB and emesis. Xarelto has been on hold since 6/19 for g-tube placement. This  was done 6/23. Now to restart anticoagulation with lovenox. Using lovenox instead of xarelto since renal function is poor.   Goal of Therapy:  Anti-Xa level 0.6-1 units/ml 4hrs after LMWH dose given Monitor platelets by anticoagulation protocol: Yes   Plan:  1. Lovenox 35m SQ Q24H 2. CBC Q72H while on lovenox 3. F/u plans for oral anticoagulation 4. F/u renal fxn  Rumbarger, RRande Lawman6/24/2015,2:41 PM

## 2013-11-05 NOTE — Progress Notes (Signed)
[place] TEAM 1 - Stepdown/ICU TEAM Progress Note  [name] ELF:[id number] DOB: 01/09/1921 DOA: 10/24/2013 PCP: [name], MD  Admit HPI / Brief Narrative: 80 y.o. male with PMH of HTN, HLD, DM, CAD, COPD, CKD, A fib on AC, and frequent falls who presented with lower R sided abdominal pain of 12hrs duration.  Patient stated he had been nauseated but had not vomited.  During his ED work up he was found to have a RVR and therefore was started on IV cardizem.  Pt also reported productive cough, with yellow green sputum, wheezing, SOB, some fever, and chills. Of note, pt was hospitalized 6/7-6/8 after sustaining a mechanical fall at home and then being found to be suffering an "asthma exacerbation."    HPI/Subjective: Pt has no new complaints today.  Multiple family members are at bedside.  He denies cp, sob, n/v, or abdom pain.   Assessment/Plan:  PAF w/ acute RVR  -old records suggest prior fear of previous lung toxicity on amio -transitioned all meds to per tube w/ changes to formulations as necessary -RVR has become a problem again today - pt had not yet been given his meds this morning - follow w/o change in meds for now w/ meds now being dosed via PEG   RLL infiltrate / acute respiratory failure with hypoxia  -6/19 per speech evaluation; pt continues to demonstrate immediate evidence of aspiration with thin liquids and delayed evidence of aspiration with nectar thick liquids, possibly also due to prior dx of esophageal dysphagia -patient appears close to baseline - continued expiratory wheezing which is chronic, without rales/rhonchi -has completed course of abx tx for aspiration pneumonia   Aspiration s/p PEG  -6/22 daughter and son at bedside stated family had decided to pursue PEG placement - pt verbalized understanding and was willing to proceed - order placed - held Xarelto (had not gotten since 6/19) -PEG placed per IR 6/23, and cleared for full use per IR this  morning - Nutrition calculating needs and TF to start today   Abdominal pain with cholelithiasis - resolved -CT abdom unrevealing -[place] noted 1.6 cm gallstone in gallbladder neck with significant sludge in gallbladder but no definite evidence of acute cholecystitis  -HIDA scan not c/w acute cholecystitis  -pt denies further abdom pain  Acute urinary retention  -Catheter Team placed a coude cath -Urine culture; negative  -some hematuria but appears to be clearing today - follow w/ lovenox use   UTI -Zosyn tx course completed - urine cx not helpful  -Urine sample also containing uric acid crystals -renal ultrasound w/o acute findings   COPD/chronic bronchitis w/ acute bronchospastic exacerbation  -See RLL infiltrate -6/19 PCXR stable  Hx of tachy/brady  -s/p pacer placement    DM uncontrolled with renal complications  -CBG reasonably controlled   CAD  -s/p DES to cx   Chronic grade 2 diastolic CHF with very mild systolic CHF (EF 58%)  -appears well compensated at this time   Hx of DVT and PE  -now s/p PEG so will plan to resume anticoag using lovenox for now as GFR too high for Xarelto  HTN  -Although not within ADA/AHA guideline patient is 80 years old so will allow permissive HTN    CKD stage III  -Baseline GFR 30-59 - crt appears to be at baseline   Hypernatremia  -Resolved - continue to monitor  Hypokalemia -Resolved - continue to monitor  Hypomagnesemia -Resolved - continue to monitor  Code Status: FULL  Family Communication: spoke at length w/ daughter, son, and son-in-law at bedside  Disposition Plan: SDU - possible d/c to SNF Friday if TF tolerated and HR remains controlled   Consultants: NA  Procedure/Significant Events: 6/12 CXR;Stable cardiomegaly. No acute cardiopulmonary disease. 6/12 CT abdomen pelvis with contrast; Stable gallstones within the gallbladder.Urinary bladder is moderately distended   6/12 ultrasound abdomen complete; Gallbladder  Distended by sludge. 1.6 cm shadowing calculus a gallbladder neck. Upper normal gallbladder wall thickness. No definite pericholecystic fluid or sonographic Murphy sign. 6/13 HIDA scan;Patent cystic and common bile ducts. 6/15 PCXR;New increased density right lung base consistent with atelectasis or pneumonia. No CHF. 6/23 - IR placed PEG tube  Antibiotics: Zosyn 6/12 >>6/13 + 6/15 >> 6/22  DVT prophylaxis: SCDs + lovenox  Devices Pacer   Objective: VITAL SIGNS: Blood pressure 146/71, pulse 129, temperature 97.7 F (36.5 C), temperature source Oral, resp. rate 17, height 5\' 7"  (1.702 m), weight 78.2 kg (172 lb 6.4 oz), SpO2 97.00%.  Intake/Output Summary (Last 24 hours) at 11/05/13 1346 Last data filed at 11/05/13 0600  Gross per 24 hour  Intake 913.33 ml  Output    200 ml  Net 713.33 ml   Exam: General: No acute respiratory distress - alert and conversant  Lungs: Clear to auscultation bilateral  Cardiovascular: Regular rate without murmur gallop or rub at time of exam (post meds) Abdomen: Nontender, nondistended, soft, bowel sounds positive, no rebound, no ascites, no appreciable mass - PEG insertion clean and dry  Extremities: No significant cyanosis, clubbing, or edema bilateral lower extremities  Data Reviewed: Basic Metabolic Panel:  Recent Labs Lab 11/01/13 0246 11/02/13 0212 11/03/13 0215 11/04/13 0319 11/05/13 0258  NA 143 140 138 142 140  K 3.3* 3.6* 4.4 3.5* 4.1  CL 101 99 98 101 103  CO2 26 25 26 27 23   GLUCOSE 48* 180* 195* 92 114*  BUN 25* 27* 31* 30* 27*  CREATININE 1.55* 1.75* 2.11* 1.91* 1.63*  CALCIUM 8.7 8.6 8.2* 8.8 8.3*  MG 1.8 2.3 2.2 2.1 2.0    Liver Function Tests:  Recent Labs Lab 11/01/13 0246 11/02/13 0212 11/03/13 0215 11/04/13 0319 11/05/13 0258  AST 16 19 24 18 16   ALT 13 14 14 14 10   ALKPHOS 70 83 79 80 83  BILITOT 0.6 0.8 0.9 0.8 0.7  PROT 5.5* 5.5* 5.0* 5.2* 4.9*  ALBUMIN 2.4* 2.4* 2.2* 2.3* 2.1*   CBC:  Recent  Labs Lab 11/01/13 0246 11/02/13 0212 11/03/13 0215 11/04/13 0319 11/05/13 0258  WBC 22.5* 20.6* 17.1* 14.4* 13.8*  NEUTROABS 21.1* 18.2* 15.7* 11.8* 11.0*  HGB 11.9* 13.2 11.7* 12.4* 12.3*  HCT 36.2* 41.0 35.4* 39.7 38.7*  MCV 93.8 94.0 93.7 94.5 96.3  PLT 246 239 223 263 272   CBG:  Recent Labs Lab 11/04/13 2038 11/05/13 0104 11/05/13 0433 11/05/13 0806 11/05/13 1150  GLUCAP 136* 123* 118* 118* 161*    Recent Results (from the past 240 hour(s))  CULTURE, EXPECTORATED SPUTUM-ASSESSMENT     Status: None   Collection Time    10/30/13  1:40 PM      Result Value Ref Range Status   Specimen Description SPUTUM   Final   Special Requests Normal   Final   Sputum evaluation     Final   Value: THIS SPECIMEN IS ACCEPTABLE. RESPIRATORY CULTURE REPORT TO FOLLOW.   Report Status 10/30/2013 FINAL   Final  CULTURE, RESPIRATORY (NON-EXPECTORATED)     Status: None   Collection Time  10/30/13  1:40 PM      Result Value Ref Range Status   Specimen Description SPUTUM   Final   Special Requests NONE   Final   Gram Stain     Final   Value: MODERATE WBC PRESENT,BOTH PMN AND MONONUCLEAR     FEW SQUAMOUS EPITHELIAL CELLS PRESENT     RARE GRAM POSITIVE COCCI     IN PAIRS RARE Y     Performed at [facility]   Culture     Final   Value: FEW CANDIDA ALBICANS     Performed at [facility]   Report Status 11/01/2013 FINAL   Final  URINE CULTURE     Status: None   Collection Time    10/30/13  2:41 PM      Result Value Ref Range Status   Specimen Description URINE, CATHETERIZED   Final   Special Requests NONE   Final   Culture  Setup Time     Final   Value: 10/30/2013 18:40     Performed at [place]     Final   Value: NO GROWTH     Performed at [facility]   Culture     Final   Value: NO GROWTH     Performed at [facility]   Report Status 10/31/2013 FINAL   Final  URINE CULTURE     Status: None   Collection Time     11/03/13  5:01 AM      Result Value Ref Range Status   Specimen Description URINE, CATHETERIZED   Final   Special Requests NONE   Final   Culture  Setup Time     Final   Value: 11/03/2013 05:14     Performed at [place]     Final   Value: NO GROWTH     Performed at [facility]   Culture     Final   Value: NO GROWTH     Performed at [facility]   Report Status 11/04/2013 FINAL   Final     Studies:  Recent x-ray studies have been reviewed in detail by the Attending Physician  Scheduled Meds:  Scheduled Meds: . antiseptic oral rinse  15 mL Mouth Rinse BID  . budesonide  0.25 mg Nebulization BID  . digoxin  0.125 mg Per Tube Daily  . diltiazem  90 mg Per Tube 4 times per day  . feeding supplement (GLUCERNA 1.2 CAL)  1,000 mL Per Tube Q24H  . hydrALAZINE  10 mg Intravenous 3 times per day  . insulin aspart  0-9 Units Subcutaneous 6 times per day  . insulin glargine  10 Units Subcutaneous QHS  . ipratropium  0.5 mg Nebulization QID  . levalbuterol  0.63 mg Nebulization BID  . metoprolol  7.5 mg Intravenous 4 times per day  . sodium chloride  3 mL Intravenous Q12H   Time spent on care of this patient: 35 mins  Cherene Altes, MD [facility] For Consults/Admissions - Flow Manager - [phone] Office  [phone] Pager [phone]  On-Call/Text Page:      [email or web address]      password [facility]  11/05/2013, 1:46 PM   LOS: 12 days

## 2013-11-05 NOTE — Progress Notes (Signed)
Subjective: Perc G tube placed 6/23 Resting No complaints  Objective: Vital signs in last 24 hours: Temp:  [96.4 F (35.8 C)-98.2 F (36.8 C)] 98 F (36.7 C) (06/24 0400) Pulse Rate:  [63-129] 103 (06/24 0400) Resp:  [12-27] 24 (06/24 0400) BP: (122-161)/(38-89) 132/63 mmHg (06/24 0528) SpO2:  [93 %-100 %] 97 % (06/24 0400) Last BM Date: 11/02/13  Intake/Output from previous day: 06/23 0701 - 06/24 0700 In: 2072.5 [I.V.:1672.5; IV Piggyback:400] Out: 900 [Urine:900] Intake/Output this shift:    PE:  Afeb; incr HR (afib) Site is clean and dry; NT No bleeding +BS G tube intact  Lab Results:   Recent Labs  11/04/13 0319 11/05/13 0258  WBC 14.4* 13.8*  HGB 12.4* 12.3*  HCT 39.7 38.7*  PLT 263 272   BMET  Recent Labs  11/04/13 0319 11/05/13 0258  NA 142 140  K 3.5* 4.1  CL 101 103  CO2 27 23  GLUCOSE 92 114*  BUN 30* 27*  CREATININE 1.91* 1.63*  CALCIUM 8.8 8.3*   PT/INR  Recent Labs  11/04/13 0319  LABPROT 14.5  INR 1.15   ABG No results found for this basename: PHART, PCO2, PO2, HCO3,  in the last 72 hours  Studies/Results: Ir Gastrostomy Tube Mod Sed  11/04/2013   CLINICAL DATA:  Dysphagia, aspiration  EXAM: [place]  Date:  6/23/20156/23/2015 11:45 AM  Radiologist:  Jerilynn Mages. Daryll Brod, MD  Guidance:  Fluoroscopic  FLUOROSCOPY TIME:  2 min 12 seconds  MEDICATIONS AND MEDICAL HISTORY: 2 g ancefadministered within 1 hour of the procedure,0.5 mg Versed, 25 mcg fentanyl  ANESTHESIA/SEDATION: 15 min  CONTRAST:  10 cc Omnipaque 366  COMPLICATIONS: No immediate  PROCEDURE: Informed consent was obtained from the patient following explanation of the procedure, risks, benefits and alternatives. The patient understands, agrees and consents for the procedure. All questions were addressed. A time out was performed.  Maximal barrier sterile technique utilized including caps, mask, sterile gowns, sterile gloves, large sterile drape, hand  hygiene, and betadine prep.  The left upper quadrant was sterilely prepped and draped. An oral gastric catheter was inserted into the stomach under fluoroscopy. The existing nasogastric feeding tube was removed. Air was injected into the stomach for insufflation and visualization under fluoroscopy. The air distended stomach was confirmed beneath the anterior abdominal wall in the frontal and lateral projections. Under sterile conditions and local anesthesia, a 71 gauge trocar needle was utilized to access the stomach percutaneously beneath the left subcostal margin. Needle position was confirmed within the stomach under biplane fluoroscopy. Contrast injection confirmed position also. A single T tack was deployed for gastropexy. Over an Amplatz guide wire, a 9-French sheath was inserted into the stomach. A snare device was utilized to capture the oral gastric catheter. The snare device was pulled retrograde from the stomach up the esophagus and out the oropharynx. The 20-French pull-through gastrostomy was connected to the snare device and pulled antegrade through the oropharynx down the esophagus into the stomach and then through the percutaneous tract external to the patient. The gastrostomy was assembled externally. Contrast injection confirms position in the stomach. Images were obtained for documentation. The patient tolerated procedure well. No immediate complication.  IMPRESSION: Fluoroscopic insertion of a 20-French "pull-through" gastrostomy.   Electronically Signed   By: Daryll Brod M.D.   On: 11/04/2013 11:56    Anti-infectives: Anti-infectives   Start     Dose/Rate Route Frequency Ordered Stop   11/04/13 1100  ceFAZolin (ANCEF) 2-3  GM-% IVPB SOLR    Comments:  Montell, Kaye   : cabinet override      11/04/13 1100 11/04/13 2314   11/04/13 0600  ceFAZolin (ANCEF) IVPB 2 g/50 mL premix    Comments:  Begin on call to IR procedure   2 g 100 mL/hr over 30 Minutes Intravenous On call 11/03/13 1601  11/04/13 1145   10/27/13 1700  piperacillin-tazobactam (ZOSYN) IVPB 3.375 g  Status:  Discontinued     3.375 g 12.5 mL/hr over 240 Minutes Intravenous 3 times per day 10/27/13 1649 11/03/13 1924   10/24/13 2200  piperacillin-tazobactam (ZOSYN) IVPB 3.375 g  Status:  Discontinued     3.375 g 12.5 mL/hr over 240 Minutes Intravenous 3 times per day 10/24/13 1649 10/26/13 1324   10/24/13 1700  piperacillin-tazobactam (ZOSYN) IVPB 3.375 g     3.375 g 100 mL/hr over 30 Minutes Intravenous  Once 10/24/13 1645 10/24/13 1928      Assessment/Plan: s/p * No surgery found *  G tube intact May use now May take off wall suction   LOS: 12 days    Jaedon Siler A 11/05/2013

## 2013-11-05 NOTE — Progress Notes (Signed)
Physical Therapy Treatment Patient Details Name: [name] MRN: [id number] DOB: [birth year] Today's Date: 11/05/2013    History of Present Illness Pt is a 80 y.o. male with pmh significant for HTN, DM, tachybrady syndrome, PE, asthma, GERD and CKD stage 3. He was admitted with A-fib with rapid ventricular response. Pt is now s/p PEG tube placement.     PT Comments    Pt showing a decline in function since PT eval 2 days ago. Was very fatigued after ambulating 10 feet with RW and reported that he couldn't walk any farther. Was able to tolerate minimal exercise in the recliner. Overall PT frequency at to 2x/week however feel pt could benefit from one more PT visit this week. Will continue to follow.   Follow Up Recommendations  SNF;Supervision/Assistance - 24 hour     Equipment Recommendations  None recommended by PT    Recommendations for Other Services       Precautions / Restrictions Precautions Precautions: Fall Precaution Comments: 2 falls at home previously. Restrictions Weight Bearing Restrictions: No    Mobility  Bed Mobility Overal bed mobility: Needs Assistance Bed Mobility: Supine to Sit     Supine to sit: Min assist     General bed mobility comments: Assist for trunk elevation to full sitting position. Bed pad used to scoot pt's hips out to EOB.   Transfers Overall transfer level: Needs assistance Equipment used: Rolling walker (2 wheeled) Transfers: Sit to/from Stand Sit to Stand: +2 physical assistance;Mod assist         General transfer comment: VC's for hand placement on seated surface for safety. Assist to power-up to full standing. Pt required increased time to gain his balance once standing.   Ambulation/Gait Ambulation/Gait assistance: Min assist Ambulation Distance (Feet): 10 Feet Assistive device: Rolling walker (2 wheeled) Gait Pattern/deviations: Shuffle;Trunk flexed;Narrow base of support Gait velocity: Decreased Gait velocity  interpretation: Below normal speed for age/gender General Gait Details: Pt fatigued very quickly and required seated rest break after 10 feet. As pt sat, he stated he does not feel well and doesn't think he can do any more, and gait training was ended.    Stairs            Wheelchair Mobility    Modified Rankin (Stroke Patients Only)       Balance Overall balance assessment: History of Falls                                  Cognition Arousal/Alertness: Awake/alert Behavior During Therapy: WFL for tasks assessed/performed Overall Cognitive Status: History of cognitive impairments - at baseline                      Exercises General Exercises - Lower Extremity Long Arc Quad: 10 reps Heel Slides: 10 reps Hip ABduction/ADduction: 10 reps    General Comments        Pertinent Vitals/Pain Vitals stable throughout session.     Home Living                      Prior Function            PT Goals (current goals can now be found in the care plan section) Acute Rehab PT Goals Patient Stated Goal: None stated PT Goal Formulation: With patient/family Time For Goal Achievement: 11/16/2013 Potential to Achieve Goals: Fair Progress towards PT goals: Not  progressing toward goals - comment    Frequency  Min 2X/week    PT Plan Frequency needs to be updated    Co-evaluation             End of Session Equipment Utilized During Treatment: Gait belt Activity Tolerance: Patient limited by fatigue Patient left: in chair;with chair alarm set;with call bell/phone within reach     Time: 1441-1510 PT Time Calculation (min): 29 min  Charges:  $Gait Training: 8-22 mins $Therapeutic Exercise: 8-22 mins                    G Codes:      Jolyn Lent 11-22-2013, 3:36 PM  Jolyn Lent, PT, DPT Acute Rehabilitation Services Pager: [phone]

## 2013-11-05 NOTE — Progress Notes (Signed)
INITIAL NUTRITION ASSESSMENT  DOCUMENTATION CODES Per approved criteria  -Not Applicable   INTERVENTION: Initiate Glucerna 1.2 formula at 20 ml/hr and increase by 10 ml every 4 hours to goal rate of 60 ml/hr to provide 1728 kcals, 86 gm protein, 1159 ml of free water RD to follow for nutrition care plan  NUTRITION DIAGNOSIS: Inadequate oral intake related to inability to eat as evidenced by NPO status  Goal: Pt to meet >/= 90% of their estimated nutrition needs   Monitor:  TF regimen & tolerance, weight, labs, I/O's  Reason for Assessment: Consult  80 y.o. male  Admitting Dx: Atrial fibrillation with rapid ventricular response  ASSESSMENT: 80 y.o. male with complex medical history. He has currently been admitted with PAF with RVR as well as RLL infiltrate felt secondary to aspiration. He failed swallow study and pt and family have agreed to pursue gastrostomy feeding tube.  Patient s/p procedure 6/23: PER GASTROSTOMY TUBE PLACEMENT   RD spoke with pt's family at bedside.  Family reports pt hasn't had any PO intake in the past 3 days.  No recent weight loss.  Speech Path following.  ? initiation of PO diet for comfort along with TF.  Anxious to get TF started.  RD consulted for TF initiation & management.  Height: Ht Readings from Last 1 Encounters:  10/24/13 5\' 7"  (1.702 m)    Weight: Wt Readings from Last 1 Encounters:  11/04/13 172 lb 6.4 oz (78.2 kg)    Ideal Body Weight: 135 lb  % Ideal Body Weight: 127%  Wt Readings from Last 10 Encounters:  11/04/13 172 lb 6.4 oz (78.2 kg)  10/20/13 164 lb 14.5 oz (74.8 kg)  09/25/13 165 lb (74.844 kg)  05/21/13 155 lb 9.6 oz (70.58 kg)  04/22/13 165 lb (74.844 kg)  04/08/13 165 lb (74.844 kg)  03/20/13 165 lb (74.844 kg)  03/20/13 165 lb (74.844 kg)  02/21/13 165 lb (74.844 kg)  02/10/13 162 lb 7.7 oz (73.7 kg)    Usual Body Weight: 164 lb  % Usual Body Weight: 104%  BMI:  Body mass index is 27  kg/(m^2).  Estimated Nutritional Needs: Kcal: 1700-1900 Protein: 80-90 gm Fluid: 1.7-1.9 L  Skin: Intact  Diet Order: NPO  EDUCATION NEEDS: -No education needs identified at this time   Intake/Output Summary (Last 24 hours) at 11/05/13 1351 Last data filed at 11/05/13 0600  Gross per 24 hour  Intake 913.33 ml  Output    200 ml  Net 713.33 ml    Labs:   Recent Labs Lab 11/03/13 0215 11/04/13 0319 11/05/13 0258  NA 138 142 140  K 4.4 3.5* 4.1  CL 98 101 103  CO2 26 27 23   BUN 31* 30* 27*  CREATININE 2.11* 1.91* 1.63*  CALCIUM 8.2* 8.8 8.3*  MG 2.2 2.1 2.0  GLUCOSE 195* 92 114*    CBG (last 3)   Recent Labs  11/05/13 0433 11/05/13 0806 11/05/13 1150  GLUCAP 118* 118* 161*    Scheduled Meds: . antiseptic oral rinse  15 mL Mouth Rinse BID  . budesonide  0.25 mg Nebulization BID  . digoxin  0.125 mg Per Tube Daily  . diltiazem  90 mg Per Tube 4 times per day  . feeding supplement (GLUCERNA 1.2 CAL)  1,000 mL Per Tube Q24H  . hydrALAZINE  10 mg Intravenous 3 times per day  . insulin aspart  0-9 Units Subcutaneous 6 times per day  . insulin glargine  10 Units Subcutaneous  QHS  . ipratropium  0.5 mg Nebulization QID  . levalbuterol  0.63 mg Nebulization BID  . metoprolol  7.5 mg Intravenous 4 times per day  . sodium chloride  3 mL Intravenous Q12H    Continuous Infusions: . dextrose 5 % and 0.45% NaCl 50 mL/hr at 11/04/13 2000    Past Medical History  Diagnosis Date  . Presence of permanent cardiac pacemaker 04/2006 upgrade    a. s/p MDT Adapta ADDR01 dual chamber PPM, ser #: [id number] H.  [name] PAF (paroxysmal atrial fibrillation)     a. previously on coumadin-->patient self-d/c'd 11/2012 b/c he was tired of having INR's checked.  . Tachy-brady syndrome   . DM (diabetes mellitus)   . CAD (coronary artery disease)     DES to cx  . Pulmonary embolism 12/2003  . Deep vein thrombophlebitis of leg 2005, 2009    recurrent when off anticoag  . HTN  (hypertension)   . Allergic rhinitis   . Asthma   . CKD (chronic kidney disease), stage III   . Nasal polyp     Past Surgical History  Procedure Laterality Date  . Cataract/lens implants    . Nasal polyp surgery    . Coronary angioplasty with stent placement  2008    DES to cx  . Pacemaker insertion  04/2006    Medtronic Adapta  . Cardioversion N/A 03/20/2013    Procedure: CARDIOVERSION;  Surgeon: Carlena Bjornstad, MD;  Location: [facility];  Service: Cardiovascular;  Laterality: N/A;    Arthur Holms, RD, LDN Pager #: [phone] After-Hours Pager #: [phone]

## 2013-11-05 NOTE — Progress Notes (Signed)
Ready for full use

## 2013-11-05 NOTE — Progress Notes (Signed)
Speech Language Pathology     Patient Details Name: [name] MRN: [id number] DOB: 1920-06-02 Today's Date: 11/05/2013 Time:  -     SLP needs to speak with family/MD to determine plan.  Is pt. NPO or will po's be allowed for comfort in addition to PEG??   [name] [place].Ed [employer] [phone]  11/05/2013

## 2013-11-06 LAB — BASIC METABOLIC PANEL
BUN: 28 mg/dL — ABNORMAL HIGH (ref 6–23)
CALCIUM: 8.4 mg/dL (ref 8.4–10.5)
CO2: 24 mEq/L (ref 19–32)
Chloride: 102 mEq/L (ref 96–112)
Creatinine, Ser: 1.5 mg/dL — ABNORMAL HIGH (ref 0.50–1.35)
GFR, EST AFRICAN AMERICAN: 45 mL/min — AB (ref >90)
GFR, EST NON AFRICAN AMERICAN: 39 mL/min — AB (ref >90)
Glucose, Bld: 200 mg/dL — ABNORMAL HIGH (ref 70–99)
POTASSIUM: 4.1 meq/L (ref 3.7–5.3)
Sodium: 138 mEq/L (ref 137–147)

## 2013-11-06 LAB — CBC
HEMATOCRIT: 35 % — AB (ref 39.0–52.0)
Hemoglobin: 11.1 g/dL — ABNORMAL LOW (ref 13.0–17.0)
MCH: 30.5 pg (ref 26.0–34.0)
MCHC: 31.7 g/dL (ref 30.0–36.0)
MCV: 96.2 fL (ref 78.0–100.0)
PLATELETS: 294 10*3/uL (ref 150–400)
RBC: 3.64 MIL/uL — ABNORMAL LOW (ref 4.22–5.81)
RDW: 15.6 % — AB (ref 11.5–15.5)
WBC: 16.1 10*3/uL — ABNORMAL HIGH (ref 4.0–10.5)

## 2013-11-06 LAB — GLUCOSE, CAPILLARY
GLUCOSE-CAPILLARY: 198 mg/dL — AB (ref 70–99)
Glucose-Capillary: 253 mg/dL — ABNORMAL HIGH (ref 70–99)
Glucose-Capillary: 169 mg/dL — ABNORMAL HIGH (ref 70–99)
Glucose-Capillary: 187 mg/dL — ABNORMAL HIGH (ref 70–99)
Glucose-Capillary: 218 mg/dL — ABNORMAL HIGH (ref 70–99)

## 2013-11-06 LAB — DIGOXIN LEVEL: DIGOXIN LVL: 1.5 ng/mL (ref 0.8–2.0)

## 2013-11-06 MED ORDER — LEVALBUTEROL HCL 0.63 MG/3ML IN NEBU: 0.6300 mg | INHALATION_SOLUTION | Freq: Four times a day (QID) | RESPIRATORY_TRACT | Status: DC

## 2013-11-06 MED ORDER — DILTIAZEM 12 MG/ML ORAL SUSPENSION: 90.0000 mg | Freq: Four times a day (QID) | ORAL | Status: DC

## 2013-11-06 MED ORDER — GLUCERNA 1.2 CAL PO LIQD: 1000.0000 mL | ORAL | Status: DC

## 2013-11-06 MED ORDER — DIGOXIN 0.05 MG/ML PO SOLN: 0.1250 mg | Freq: Every day | ORAL | Status: DC

## 2013-11-06 MED ORDER — INSULIN GLARGINE 100 UNIT/ML ~~LOC~~ SOLN: 10.0000 [IU] | Freq: Every day | SUBCUTANEOUS | Status: DC

## 2013-11-06 MED ORDER — IPRATROPIUM BROMIDE 0.02 % IN SOLN: 0.5000 mg | Freq: Four times a day (QID) | RESPIRATORY_TRACT | Status: DC

## 2013-11-06 MED ORDER — ENOXAPARIN SODIUM 80 MG/0.8ML ~~LOC~~ SOLN: 80.0000 mg | SUBCUTANEOUS | Status: DC

## 2013-11-06 MED ORDER — METOPROLOL TARTRATE 12.5 MG HALF TABLET: 12.5000 mg | ORAL_TABLET | Freq: Two times a day (BID) | ORAL | Status: DC

## 2013-11-06 MED ORDER — HYDRALAZINE HCL 10 MG PO TABS: 10.0000 mg | ORAL_TABLET | Freq: Three times a day (TID) | ORAL | Status: DC

## 2013-11-06 MED ORDER — QC DAILY MULTIVITAMINS/IRON PO TABS: 1.0000 | ORAL_TABLET | Freq: Every day | ORAL | Status: DC

## 2013-11-06 MED ORDER — INSULIN ASPART 100 UNIT/ML FLEXPEN: 3.0000 [IU] | PEN_INJECTOR | Freq: Three times a day (TID) | SUBCUTANEOUS | Status: DC

## 2013-11-06 NOTE — Progress Notes (Signed)
Speech Language Pathology  Patient Details Name: NIKHIL OSEI MRN: [id number] DOB: 1921-01-27 Today's Date: 11/06/2013 Time:  -     This SLP spoke with Dr. Sherral Hammers who reported plan is for pt. To be strictly NPO after receiving PEG 6/23.  OT reported performing oral hygiene due to extremely dry mucosa.  SLP discussed with RN importance of aggressive oral care with this pt. due to NPO and open mouth breathing.  SLP will sign off due to NPO status.  Please reconsult if needed.   [name] [place].Ed [employer] Pager [phone]  [name] [place] [phone]  11/06/2013

## 2013-11-06 NOTE — Progress Notes (Signed)
11/06/2013 foley  was removed removed at 1100 and patient had 100cc of dark urine out of foley. Patient was ask at 1200 if he needed to urinated he stated no. Dr Ellan Lambert was aware of patient not voiding. Per Dr Sherral Hammers do bladder scan at 1400. Bladder scan was completed and patient had 0 cc in bladder. Md was notified concerning bladder scan. [facility] RN.

## 2013-11-06 NOTE — Progress Notes (Signed)
Occupational Therapy Treatment Patient Details Name: [name] MRN: [id number] DOB: [birth year] Today's Date: 11/06/2013    History of present illness Pt is a 80 y.o. male with pmh significant for HTN, DM, tachybrady syndrome, PE, asthma, GERD and CKD stage 3. He was admitted with A-fib with rapid ventricular response. Pt is now s/p PEG tube placement.    OT comments  Pt demonstrates decr mobility compared to evaluation. Pt currently without abdominal binder and RN Nadine notified. Pt repositioned to chair with alarm. Pt pleasant and reports incr comfort once in chair. Ot to continue to follow acutely for adl retraining, balance and basic mobility.   Follow Up Recommendations  SNF;Supervision/Assistance - 24 hour    Equipment Recommendations  Wheelchair (measurements OT);Wheelchair cushion (measurements OT);3 in 1 bedside comode;Hospital bed (mattress overlay)    Recommendations for Other Services      Precautions / Restrictions Precautions Precautions: Fall Precaution Comments: 2 falls at home previously. back precautions for mobilty due to hx of back pain (helps progress pt) Restrictions Weight Bearing Restrictions: No       Mobility Bed Mobility Overal bed mobility: Needs Assistance Bed Mobility: Supine to Sit     Supine to sit: Max assist     General bed mobility comments: Pt required facilitation of LT LE knee flexion and reaching with Lt UE toward bed rail. Pt log rolled to help maintain back precautions. pt sitting EOB with min guard to min support. pt asking to return to supine and redirect to chair being present. pt states "okay I can get in the chair"  Transfers Overall transfer level: Needs assistance Equipment used: 2 person hand held assist Transfers: Sit to/from Stand;Stand Pivot Transfers Sit to Stand: +2 physical assistance;Mod assist;From elevated surface Stand pivot transfers: +2 physical assistance;Mod assist;From elevated surface        General transfer comment: pt v/c for hand placement. Pt with hip flexion and required facilitation for upright posture    Balance Overall balance assessment: Needs assistance Sitting-balance support: Bilateral upper extremity supported;Feet supported Sitting balance-Leahy Scale: Fair     Standing balance support: Bilateral upper extremity supported;During functional activity Standing balance-Leahy Scale: Poor Standing balance comment: fatigues quickly and required return to sitting                    ADL Overall ADL's : Needs assistance/impaired   Eating/Feeding Details (indicate cue type and reason): PEg Grooming: Oral care;Sitting;Maximal assistance (shaving) Grooming Details (indicate cue type and reason): Pt noted to have a thick yellow/green colored film on lips. Oral care performed to help remove from lip area and swabs biotene. Pt reports something in mouth. pt noted to have secretions when [place] provided. Pt able to complete suction. Pt provided moister biotene to lips to help with dry surface. pt tolerateing well. pt provided electric razor and shaving face. pt decr length of facial hair but will require multiple attempts to remove all hair from face. pt with fatigue of UE and reports " Can I stop now?"                 Toilet Transfer: +2 for physical assistance;Moderate assistance     Toileting - Clothing Manipulation Details (indicate cue type and reason): Pt noted to have soiled sacral dressing. Dressing removed skin cleaned and new sacral dress applied by RN Justice Rocher. Pt noted to have stage II wound on gluteal fold       General ADL Comments: Pt reports back pain initially  with bed mobility.Pt reports once in chair feeld better. PT reports "mouth feels good" Pt asked "do they make a thing called morphine?" Rn notified of patient requesting pain medications      Vision                     Perception     Praxis      Cognition   Behavior During  Therapy: WFL for tasks assessed/performed Overall Cognitive Status: History of cognitive impairments - at baseline                  General Comments: Pt initially refusing mobility stated "i am getting better just leave me be. I am finally getting better"     Extremity/Trunk Assessment               Exercises     Shoulder Instructions       General Comments      Pertinent Vitals/ Pain       VSS       Called central monitor and aware therapy working with pt  Home Living                                          Prior Functioning/Environment              Frequency Min 2X/week     Progress Toward Goals  OT Goals(current goals can now be found in the care plan section)  Progress towards OT goals: Not progressing toward goals - comment  Acute Rehab OT Goals Patient Stated Goal: None stated OT Goal Formulation: Patient unable to participate in goal setting Time For Goal Achievement: 11/16/2013 Potential to Achieve Goals: Good ADL Goals Pt Will Transfer to Toilet: with min assist;with +2 assist;bedside commode Additional ADL Goal #1: Pt will complete bed mobility Mod (A)  Plan Discharge plan remains appropriate    Co-evaluation                 End of Session Equipment Utilized During Treatment: Gait belt   Activity Tolerance Patient tolerated treatment well   Patient Left in chair;with call bell/phone within reach;with chair alarm set;with nursing/sitter in room   Nurse Communication Mobility status;Precautions        Time: 5009-3818 OT Time Calculation (min): 32 min  Charges: OT General Charges $OT Visit: 1 Procedure OT Treatments $Self Care/Home Management : 23-37 mins  Peri Maris 11/06/2013, 10:55 AM Pager: [phone]

## 2013-11-06 NOTE — Discharge Summary (Signed)
Physician Discharge Summary  GRAEDEN BITNER QIW:[id number] DOB: Feb 01, 1921 DOA: 10/24/2013  PCP: [name], MD  Admit date: 10/24/2013 Discharge date: 11/06/2013  Time spent: 40 minutes  Recommendations for Outpatient Follow-up:  PAF w/ acute RVR  - old records suggest prior fear of previous lung toxicity on amio  -Not really rate controlled on digoxin 0.125 mg daily as single agent  -Discharge on Cardizem 90 mg solution via PEG  q 6hr   -Discharge on digoxin 0.125 mg solution via PEG daily  -Discharge on hydralazine 10 mg per PEG tube TID  -Discharge on metoprolol 12.5 mg per PEG tube BID -Followup with PCP 1 week    RLL infiltrate/acute respiratory failure with hypoxia  -6/19 per speech evaluation; Pt continues to demonstrate immediate evidence of aspiration with thin liquids and delayed evidence of aspiration with nectar thick liquids, possibly also due to prior dx of esophageal dysphagia  -Completed full treatment course for aspiration pneumonitis/pneumonia  -Continue budesonide nebulizer BID  -Continue ipratropium nebulizer QID  -Continue Xopenex nebulizerQID  -Continue Mucinex BID  -Flutter valve q 4hr while awake  -Followup PCP 1 week  Aspiration  - Unlikely to improve however patient being discharged to [facility], recommend swallow eval at least one time per week, hopefully as patient regains strength will be able to at least tolerate palliative feedings without aspiration.    Abdominal pain with cholelithiasis  -CT abdom unrevealing  - [place] noted 1.6 cm gallstone in gallbladder neck with significant sludge in gallbladder but no definite evidence of acute cholecystitis  - HIDA scan not c/w acute cholecystitis  -On day of discharge denies abdom pain today   Acute urinary retention  -Catheter has been in place since 6/17, and is due for change. Will remove and determine if patient needs to be discharged with urinary catheter.   UTI  -Course of Zosyn completed  stopped on 6/22  -Renal ultrasound nondiagnostic, PCP to monitor renal status, if worsening consider outpatient Nephrology consult   COPD/chronic bronchitis w/ acute bronchospastic exacerbation  -Patient now at baseline, no longer O2 dependent.  Hx of tachy/brady  -s/p pacer placement   DM uncontrolled with renal complications  -6/7 Hemoglobin A1c =9.3  - Discharge on  Lantus 10 units QHS  -Decrease Lantus to 10 units daily  -Discharge NovoLog 3 units  QHS, heartland to titrate for effect.    CAD  -s/p DES to cx   Chronic grade 2 diastolic CHF with very mild systolic CHF (EF 41%)  -appears well compensated at this time   Hx of DVT and PE  -Secondary patient's renal status will discharge on full dose Lovenox (80 mg daily) instead on his previous Xarelto.    HTN  -Although not within ADA/AHA guideline patient is 80 years old so will allow permissive HTN  -See atrial fibrillation with RVR  CKD stage III  -Baseline GFR 30-59 - crt appears to be at baseline  -At discharge Cr= 1.50  Hypernatremia  -Resolved continue to monitor   Hypokalemia  -Resolved -PCP to monitor potassium goa is to maintain potassium l>4  Hypomagnesemia  -Resolved; PCP to monitor -Magnesium goal> 2     Discharge Diagnoses:  Principal Problem:   Atrial fibrillation with rapid ventricular response Active Problems:   DM (diabetes mellitus), type 2 with renal complications   HYPERTENSION   CAD   Embolism, pulmonary with infarction, hx of   COPD exacerbation   Acute respiratory failure with hypoxia   Sepsis   Hypernatremia  History of DVT (deep vein thrombosis)   Aspiration pneumonia   Hypokalemia   Hypomagnesemia   Discharge Condition: Stable  Diet recommendation: Heart healthy, feedings via PEG  Filed Weights   11/01/13 0355 11/04/13 0400 11/06/13 0500  Weight: 78.2 kg (172 lb 6.4 oz) 78.2 kg (172 lb 6.4 oz) 78 kg (171 lb 15.3 oz)    History of present illness:  80 y.o.WM PMHx  HTN, HLD, DM, CAD, COPD, CKD,Tachy-brady syndrome  , A fib on NOAC, S/P implanted Pacer 04/2006(MDT Adapta ADDR01 dual chamber PPM, ser #: [id number]). Hx PE 12/2003, Recurrent DVT 2005/2009,  Pesented with lower R sided abdominal pain of 12hrs duration. Patient stated he had been nauseated but had not vomited. During his ED work up he was found to have a RVR and therefore was started on IV cardizem. Pt also reported productive cough, with yellow green sputum, wheezing, SOB, some fever, and chills. Of note, pt was hospitalized 6/7-6/8 after sustaining a mechanical fall at home and then being found to be suffering an "asthma exacerbation."  6/17 Pt was found to have urinary retention by his RN this morning. Attempts to place a foley cath were unsuccessful. I have asked the catheter team to place an 73F coude cath. The pt is quite pleasant, though confused. He is not aware of any urinary sx or abdom pressure per his report. He denies abdom pain.  6/25 S./P. PEG tube placement  A./O. x4, Negative CP/SOB. (not on home O2). Currently on Glucerna 1.2 CAL at goal rate of 60 ml/hr. Patient's only complaint on date of discharge is back pain which did not improve with getting out of bed to chair.  Patient's heart rate currently at goal of<80 bpm     Procedure/Significant Events:  6/12 CXR;Stable cardiomegaly. No acute cardiopulmonary disease.  6/12 CT abdomen pelvis with contrast; Stable gallstones within the gallbladder.Urinary bladder is moderately distended  6/12 ultrasound abdomen complete; Gallbladder Distended by sludge. 1.6 cm shadowing calculus a gallbladder neck. Upper normal gallbladder wall thickness. No definite pericholecystic fluid or sonographic Murphy sign.  6/13 HIDA scan;Patent cystic and common bile ducts.  6/15 PCXR;New increased density right lung base consistent with atelectasis or pneumonia. No CHF.  6/22 renal ultrasound; Bilateral corticomedullary thinning in both kidneys, with  prominence of renal sinus adipose tissues suggesting renal sinus lipomatosis.  - Incidentally observed chololithiasis.    Culture  6/12 Blood Rt Arm/Hand NTD (final)  6/12 MRSA by PCR Negative  6/18 urine NTD (final)  6/18 sputum positive rare gram-positive cocci in pairs (final)    Antibiotics:  Zosyn 6/12 >>6/13 + 6/15 >> stopped date 6/22    DVT prophylaxis:  Full dose Lovenox   Devices  Pacer     Discharge Exam: Filed Vitals:   11/05/13 2353 11/06/13 0400 11/06/13 0500 11/06/13 0820  BP: 157/54 164/58    Pulse: 77 88  73  Temp: 98 F (36.7 C) 97.8 F (36.6 C)  97.9 F (36.6 C)  TempSrc: Oral Oral  Axillary  Resp: _0 Height:      Weight:   78 kg (171 lb 15.3 oz)   SpO2: 96% 96%  98%   General: A./O. x4, NAD, stage II sacral decubitus, No acute respiratory distress  Lungs: Clear to auscultation bilateral , positive diffuse expiratory wheezing  Cardiovascular: Regular rate and rhythm without murmur gallop or rub normal S1 and S2  Abdomen: Nontender, nondistended, soft, bowel sounds positive, no rebound, no ascites, no appreciable  mass  Extremities: No significant cyanosis, clubbing, or edema bilateral lower extremities  Discharge Instructions     Medication List    ASK your doctor about these medications       albuterol 108 (90 BASE) MCG/ACT inhaler  Commonly known as:  PROVENTIL HFA;VENTOLIN HFA  Inhale 2 puffs into the lungs every 4 (four) hours as needed for wheezing or shortness of breath.     budesonide 0.25 MG/2ML nebulizer solution  Commonly known as:  PULMICORT  Take 0.25 mg by nebulization every 6 (six) hours as needed (shortness of breath). Use two times a day     clonazePAM 0.5 MG tablet  Commonly known as:  KLONOPIN  Take 0.5 mg by mouth at bedtime as needed (for sleep).     diltiazem 360 MG 24 hr capsule  Commonly known as:  CARDIZEM CD  Take 1 capsule (360 mg total) by mouth daily.     furosemide 40 MG tablet  Commonly known  as:  LASIX  Take 1 tablet (40 mg total) by mouth daily.     glipiZIDE 5 MG tablet  Commonly known as:  GLUCOTROL  Take 1.5 tablets (7.5 mg total) by mouth daily before breakfast.     glucose blood test strip  Commonly known as:  ONE TOUCH ULTRA TEST  - Use to check blood sugars before breakfast  - Dx 250.00     guaiFENesin 600 MG 12 hr tablet  Commonly known as:  MUCINEX  Take 1 tablet (600 mg total) by mouth 2 (two) times daily.     HYDROcodone-acetaminophen 5-325 MG per tablet  Commonly known as:  NORCO/VICODIN  Take 0.5-1 tablets by mouth every 8 (eight) hours as needed for moderate pain.     ipratropium 0.02 % nebulizer solution  Commonly known as:  ATROVENT  Take 0.5 mg by nebulization 4 (four) times daily - after meals and at bedtime.     levalbuterol 0.63 MG/3ML nebulizer solution  Commonly known as:  XOPENEX  Take 3 mLs (0.63 mg total) by nebulization every 6 (six) hours as needed for wheezing or shortness of breath.     metoprolol tartrate 25 MG tablet  Commonly known as:  LOPRESSOR  Take 1 tablet (25 mg total) by mouth 2 (two) times daily.     nitroGLYCERIN 0.3 MG SL tablet  Commonly known as:  NITROSTAT  Place 0.3 mg under the tongue every 5 (five) minutes as needed for chest pain.     ONE TOUCH LANCETS Misc  - Use to help check blood sugars before breakfast  - Dx 250.00     ONE TOUCH ULTRA 2 W/DEVICE Kit  - Use to check blood sugars daily  - Dx 250.00     QC DAILY MULTIVITAMINS/IRON Tabs  Take 1 tablet by mouth daily.     Rivaroxaban 15 MG Tabs tablet  Commonly known as:  XARELTO  Take 1 tablet (15 mg total) by mouth daily with supper.       Allergies  Allergen Reactions  . Asa Buff (Mag [Buffered Aspirin] Nausea Only  . Ibuprofen     REACTION: causes nausea and vomitting      The results of significant diagnostics from this hospitalization (including imaging, microbiology, ancillary and laboratory) are listed below for reference.     Significant Diagnostic Studies: Ct Abdomen Pelvis Wo Contrast  10/19/2013   CLINICAL DATA:  Fall. Patient on several toe. Right chest wall pain.  EXAM: CT CHEST, ABDOMEN AND PELVIS WITHOUT  CONTRAST  TECHNIQUE: Multidetector CT imaging of the chest, abdomen and pelvis was performed following the standard protocol without IV contrast.  COMPARISON:  Abdominal pelvic CT 02/05/2013 and 09/05/2008  FINDINGS: CT CHEST FINDINGS  Lungs are well inflated without consolidation or effusion. There is subtle patchy peripheral increased interstitial markings in a few small bilateral peripheral nodular opacities with the largest measuring 4 mm over the left lower lobe.  Left-sided pacemaker is present. Heart is normal in size. There is calcification in the region of the mitral valve anulus. There is calcified plaque over the left anterior descending and lateral circumflex coronary arteries. There is moderate calcified plaque over the thoracic aorta. There is no significant mediastinal, hilar or axillary adenopathy. No acute fracture.  CT ABDOMEN AND PELVIS FINDINGS  Single 1.7 cm gallstone. The liver, spleen, pancreas and adrenal glands are within normal. Kidneys are normal in size without hydronephrosis or nephrolithiasis. There is no free peritoneal air or free fluid. There is moderate calcified plaque involving the abdominal aorta and iliac arteries. There is moderate diverticulosis of the colon. Appendix is normal.  Pelvic images demonstrate a small left inguinal hernia containing only peritoneal fat. Bladder, prostate and rectum are unremarkable. There are moderate degenerative changes of the spine and hips. There is no acute fracture identified.  IMPRESSION: No acute findings in the chest, abdomen or pelvis.  Minimal patchy increased interstitial markings with a few small peripheral bilateral pulmonary nodules with the largest measuring 4 mm over the left lower lobe. Recommend follow-up noncontrast chest CT 1 year.  This  recommendation follows the consensus statement: Guidelines for Management of Small Pulmonary Nodules Detected on CT Scans: A Statement from the [place] as published in Radiology 2005; 237:395-400. Online at: [email or web address].[email or web address]/.  Cholelithiasis.  Atherosclerotic coronary artery disease.  Moderate diverticulosis of the colon.   Electronically Signed   By: Marin Olp M.D.   On: 10/19/2013 08:36   Dg Chest 2 View  10/24/2013   CLINICAL DATA:  Cough.  Shortness of breath.  Chest congestion.  EXAM: CHEST  2 VIEW  COMPARISON:  CT chest 10/19/2013. Portable chest x-ray that same date. Two-view chest x-ray 9/20 08/1012, 09/05/2012.  FINDINGS: Cardiac silhouette mildly enlarged but stable. Left subclavian dual lead transvenous pacemaker unchanged and appears intact. Stable chronic eventration of the right anterior hemidiaphragm. Lungs clear. Bronchovascular markings normal. Pulmonary vascularity normal. No visible pleural effusions. No pneumothorax. Degenerative changes and DISH involving the thoracic spine. Mid thigh osseous demineralization.  IMPRESSION: Stable cardiomegaly.  No acute cardiopulmonary disease.   Electronically Signed   By: Evangeline Dakin M.D.   On: 10/24/2013 12:54   Ct Head Wo Contrast  10/19/2013   CLINICAL DATA:  History of trauma from a fall. Right-sided neck pain. Patient is on blood thinners.  EXAM: CT HEAD WITHOUT CONTRAST  CT CERVICAL SPINE WITHOUT CONTRAST  TECHNIQUE: Multidetector CT imaging of the head and cervical spine was performed following the standard protocol without intravenous contrast. Multiplanar CT image reconstructions of the cervical spine were also generated.  COMPARISON:  Head CT 04/22/2013.  Cervical spine CT 04/22/2013.  FINDINGS: CT HEAD FINDINGS  Multiple tiny well-defined foci of low attenuation in the basal ganglia bilaterally, compatible with old lacunar infarcts. Patchy and confluent areas of decreased attenuation are  noted throughout the deep and periventricular white matter of the cerebral hemispheres bilaterally, compatible with chronic microvascular ischemic disease. Mild cerebral atrophy. No acute displaced skull fractures are identified. No acute intracranial abnormality. Specifically, no evidence  of acute post-traumatic intracranial hemorrhage, no definite regions of acute/subacute cerebral ischemia, no focal mass, mass effect, hydrocephalus or abnormal intra or extra-axial fluid collections. The visualized paranasal sinuses and mastoids are well pneumatized. Posterior aspect of the left mastoid is sclerotic (unchanged), potentially related to remote infection. Mastoids are otherwise well pneumatized bilaterally. Extensive multifocal mucoperiosteal thickening throughout the paranasal sinuses, with complete opacification of the frontal sinuses bilaterally and the majority of the ethmoid sinuses bilaterally, indicative of longstanding chronic sinusitis. Postoperative changes of bilateral maxillary antrectomy are noted.  CT CERVICAL SPINE FINDINGS  No acute displaced fracture of the cervical spine. Alignment is anatomic. Prevertebral soft tissues are normal. Multilevel degenerative disc disease throughout the cervical spine, with multilevel facet arthropathy. Multiple prominent anterior vertebral body osteophytes, most evident at C4-C5 and C6-C7. Visualized portions of the upper thorax are unremarkable.  IMPRESSION: 1. No evidence of significant acute traumatic injury to the skull, brain or cervical spine. 2. Mild cerebral atrophy with extensive chronic microvascular ischemic changes in the cerebral white matter bilaterally, and multiple old lacunar infarcts in the basal ganglia bilaterally. 3. Mild multilevel degenerative disc disease and cervical spondylosis, as above. 4. Extensive chronic paranasal sinus disease, as above.   Electronically Signed   By: Vinnie Langton M.D.   On: 10/19/2013 08:29   Ct Chest Wo  Contrast  10/19/2013   CLINICAL DATA:  Fall. Patient on several toe. Right chest wall pain.  EXAM: CT CHEST, ABDOMEN AND PELVIS WITHOUT CONTRAST  TECHNIQUE: Multidetector CT imaging of the chest, abdomen and pelvis was performed following the standard protocol without IV contrast.  COMPARISON:  Abdominal pelvic CT 02/05/2013 and 09/05/2008  FINDINGS: CT CHEST FINDINGS  Lungs are well inflated without consolidation or effusion. There is subtle patchy peripheral increased interstitial markings in a few small bilateral peripheral nodular opacities with the largest measuring 4 mm over the left lower lobe.  Left-sided pacemaker is present. Heart is normal in size. There is calcification in the region of the mitral valve anulus. There is calcified plaque over the left anterior descending and lateral circumflex coronary arteries. There is moderate calcified plaque over the thoracic aorta. There is no significant mediastinal, hilar or axillary adenopathy. No acute fracture.  CT ABDOMEN AND PELVIS FINDINGS  Single 1.7 cm gallstone. The liver, spleen, pancreas and adrenal glands are within normal. Kidneys are normal in size without hydronephrosis or nephrolithiasis. There is no free peritoneal air or free fluid. There is moderate calcified plaque involving the abdominal aorta and iliac arteries. There is moderate diverticulosis of the colon. Appendix is normal.  Pelvic images demonstrate a small left inguinal hernia containing only peritoneal fat. Bladder, prostate and rectum are unremarkable. There are moderate degenerative changes of the spine and hips. There is no acute fracture identified.  IMPRESSION: No acute findings in the chest, abdomen or pelvis.  Minimal patchy increased interstitial markings with a few small peripheral bilateral pulmonary nodules with the largest measuring 4 mm over the left lower lobe. Recommend follow-up noncontrast chest CT 1 year.  This recommendation follows the consensus statement:  Guidelines for Management of Small Pulmonary Nodules Detected on CT Scans: A Statement from the [employer] as published in Radiology 2005; 237:395-400. Online at: [email or web address].[email or web address]/.  Cholelithiasis.  Atherosclerotic coronary artery disease.  Moderate diverticulosis of the colon.   Electronically Signed   By: Marin Olp M.D.   On: 10/19/2013 08:36   Ct Cervical Spine Wo Contrast  10/19/2013   CLINICAL DATA:  History of trauma  from a fall. Right-sided neck pain. Patient is on blood thinners.  EXAM: CT HEAD WITHOUT CONTRAST  CT CERVICAL SPINE WITHOUT CONTRAST  TECHNIQUE: Multidetector CT imaging of the head and cervical spine was performed following the standard protocol without intravenous contrast. Multiplanar CT image reconstructions of the cervical spine were also generated.  COMPARISON:  Head CT 04/22/2013.  Cervical spine CT 04/22/2013.  FINDINGS: CT HEAD FINDINGS  Multiple tiny well-defined foci of low attenuation in the basal ganglia bilaterally, compatible with old lacunar infarcts. Patchy and confluent areas of decreased attenuation are noted throughout the deep and periventricular white matter of the cerebral hemispheres bilaterally, compatible with chronic microvascular ischemic disease. Mild cerebral atrophy. No acute displaced skull fractures are identified. No acute intracranial abnormality. Specifically, no evidence of acute post-traumatic intracranial hemorrhage, no definite regions of acute/subacute cerebral ischemia, no focal mass, mass effect, hydrocephalus or abnormal intra or extra-axial fluid collections. The visualized paranasal sinuses and mastoids are well pneumatized. Posterior aspect of the left mastoid is sclerotic (unchanged), potentially related to remote infection. Mastoids are otherwise well pneumatized bilaterally. Extensive multifocal mucoperiosteal thickening throughout the paranasal sinuses, with complete opacification of the frontal  sinuses bilaterally and the majority of the ethmoid sinuses bilaterally, indicative of longstanding chronic sinusitis. Postoperative changes of bilateral maxillary antrectomy are noted.  CT CERVICAL SPINE FINDINGS  No acute displaced fracture of the cervical spine. Alignment is anatomic. Prevertebral soft tissues are normal. Multilevel degenerative disc disease throughout the cervical spine, with multilevel facet arthropathy. Multiple prominent anterior vertebral body osteophytes, most evident at C4-C5 and C6-C7. Visualized portions of the upper thorax are unremarkable.  IMPRESSION: 1. No evidence of significant acute traumatic injury to the skull, brain or cervical spine. 2. Mild cerebral atrophy with extensive chronic microvascular ischemic changes in the cerebral white matter bilaterally, and multiple old lacunar infarcts in the basal ganglia bilaterally. 3. Mild multilevel degenerative disc disease and cervical spondylosis, as above. 4. Extensive chronic paranasal sinus disease, as above.   Electronically Signed   By: Vinnie Langton M.D.   On: 10/19/2013 08:29   Nm Hepatobiliary Liver Func  10/25/2013   CLINICAL DATA:  Rule out cholecystitis  EXAM: NUCLEAR MEDICINE HEPATOBILIARY IMAGING  TECHNIQUE: Sequential images of the abdomen were obtained out to 60 minutes following intravenous administration of radiopharmaceutical.  RADIOPHARMACEUTICALS:  Five [employer] HT-34K Choletec  COMPARISON:  Abdominal ultrasound from 1 day prior  FINDINGS: There is symmetric appearance of the liver. There is normal clearing of tracer from the blood pool. There is prompt visualization of the biliary tree, draining into the small bowel. Radiotracer collection in the right subhepatic region has an appearance consistent with gallbladder. No surrounding hyperemia.  IMPRESSION: Patent cystic and common bile ducts.   Electronically Signed   By: Jorje Guild M.D.   On: 10/25/2013 10:37   US Abdomen Complete  10/24/2013    CLINICAL DATA:  RIGHT upper quadrant pain, leukocytosis, question cholecystitis, past history diabetes, coronary artery disease, hypertension, pulmonary embolism, DVT, chronic kidney disease  EXAM: ULTRASOUND ABDOMEN COMPLETE  COMPARISON:  CT abdomen and pelvis 10/24/2013  FINDINGS: Gallbladder:  Distended by sludge. 1.6 cm shadowing calculus a gallbladder neck. Upper normal gallbladder wall thickness. No definite pericholecystic fluid or sonographic Murphy sign.  Common bile duct:  Diameter:  4 mm diameter, normal  Liver:  Normal appearance  IVC:  Normal appearance  Pancreas:  Very distal tail obscured by bowel gas, remainder normal appearance  Spleen:  Normal appearance, 6.4 cm length  Right Kidney:  Length: 10.1 cm. Cortical thinning. Normal cortical echogenicity. No mass or hydronephrosis.  Left Kidney:  Length: 9.6 cm. Cortical thinning. Normal echogenicity. No mass or hydronephrosis.  Abdominal aorta:  Predominately obscured by bowel gas, visualized portion normal caliber  Other findings:  No free fluid  IMPRESSION: 1.6 cm diameter gallstone in gallbladder neck with significant sludge in gallbladder.  No definite evidence of acute cholecystitis.  If acute cholecystitis remains a clinical consideration, consider radionuclide hepatobiliary imaging.  Age-related renal cortical atrophy.   Electronically Signed   By: Lavonia Dana M.D.   On: 10/24/2013 16:28   Ct Abdomen Pelvis W Contrast  10/24/2013   CLINICAL DATA:  Low back pain.  Vomiting.  EXAM: CT ABDOMEN AND PELVIS WITH CONTRAST  TECHNIQUE: Multidetector CT imaging of the abdomen and pelvis was performed using the standard protocol following bolus administration of intravenous contrast.  CONTRAST:  55m OMNIPAQUE IOHEXOL 300 MG/ML  SOLN  COMPARISON:  02/05/2013  FINDINGS: Pacer wires noted in the right side of the heart. Lung bases are clear. No effusions.  Stable gallstones within the gallbladder. Liver, spleen, pancreas, adrenals and kidneys are  unremarkable. Urinary bladder is moderately distended, otherwise unremarkable. Descending colonic and sigmoid diverticulosis without active diverticulitis. Moderate stool in the right colon and transverse colon. Appendix is visualized and is normal. Stomach and small bowel decompressed, grossly unremarkable. No free fluid, free air or adenopathy.  Left inguinal hernia containing fat. Aorta and iliac vessels are heavily calcified, non aneurysmal.  Degenerative changes in the lumbar spine and hips. Diffuse osteopenia.  IMPRESSION: Left colonic diverticulosis.  No active diverticulitis.  Cholelithiasis.  Left inguinal hernia containing fat.  No acute findings in the abdomen or pelvis.   Electronically Signed   By: KRolm BaptiseM.D.   On: 10/24/2013 13:06   Ir Gastrostomy Tube Mod Sed  11/04/2013   CLINICAL DATA:  Dysphagia, aspiration  EXAM: [place] Date:  6/23/20156/23/2015 11:45 AM  Radiologist:  MJerilynn Mages TDaryll Brod MD  Guidance:  Fluoroscopic  FLUOROSCOPY TIME:  2 min 12 seconds  MEDICATIONS AND MEDICAL HISTORY: 2 g ancefadministered within 1 hour of the procedure,0.5 mg Versed, 25 mcg fentanyl  ANESTHESIA/SEDATION: 15 min  CONTRAST:  10 cc Omnipaque 3338 COMPLICATIONS: No immediate  PROCEDURE: Informed consent was obtained from the patient following explanation of the procedure, risks, benefits and alternatives. The patient understands, agrees and consents for the procedure. All questions were addressed. A time out was performed.  Maximal barrier sterile technique utilized including caps, mask, sterile gowns, sterile gloves, large sterile drape, hand hygiene, and betadine prep.  The left upper quadrant was sterilely prepped and draped. An oral gastric catheter was inserted into the stomach under fluoroscopy. The existing nasogastric feeding tube was removed. Air was injected into the stomach for insufflation and visualization under fluoroscopy. The air distended stomach was confirmed  beneath the anterior abdominal wall in the frontal and lateral projections. Under sterile conditions and local anesthesia, a 161gauge trocar needle was utilized to access the stomach percutaneously beneath the left subcostal margin. Needle position was confirmed within the stomach under biplane fluoroscopy. Contrast injection confirmed position also. A single T tack was deployed for gastropexy. Over an Amplatz guide wire, a 9-French sheath was inserted into the stomach. A snare device was utilized to capture the oral gastric catheter. The snare device was pulled retrograde from the stomach up the esophagus and out the oropharynx. The 20-French pull-through gastrostomy was connected  to the snare device and pulled antegrade through the oropharynx down the esophagus into the stomach and then through the percutaneous tract external to the patient. The gastrostomy was assembled externally. Contrast injection confirms position in the stomach. Images were obtained for documentation. The patient tolerated procedure well. No immediate complication.  IMPRESSION: Fluoroscopic insertion of a 20-French "pull-through" gastrostomy.   Electronically Signed   By: Daryll Brod M.D.   On: 11/04/2013 11:56   US Renal  11/03/2013   CLINICAL DATA:  Acute on chronic renal insufficiency. Uric acid stones/nephropathy.  EXAM: RENAL/URINARY TRACT ULTRASOUND COMPLETE  COMPARISON:  10/24/2013  FINDINGS: Right Kidney:  Length: 11.1 cm. Renal sinus lipomatosis. Combined corticomedullary thinning.  Left Kidney:  Length: 11.0 cm. Renal sinus lipomatosis with corticomedullary thinning. Questionable shadowing hyperechogenicity along the left kidney lower pole, measuring up to 1.1 cm, but given the lack of any corroborating similar finding on prior imaging from 10/24/2013, most likely this is artifactual.  Bladder:  Foley catheter in the urinary bladder.  Other:  Incidental note is again made of gallstones.  IMPRESSION: 1. Bilateral  corticomedullary thinning in both kidneys, with prominence of renal sinus adipose tissues suggesting renal sinus lipomatosis. 2. There is a suggestion of a shadowing hyperechogenicity inferiorly in the left kidney, but given that no stone was visible on recent prior ultrasound or CT this may well have been spurious/artifactual. 3. Incidentally observed chololithiasis.   Electronically Signed   By: Sherryl Barters M.D.   On: 11/03/2013 01:21   Dg Chest Port 1 View  10/31/2013   CLINICAL DATA:  Pneumonia  EXAM: PORTABLE CHEST - 1 VIEW  COMPARISON:  October 27, 2013  FINDINGS: There is patchy bibasilar atelectatic change, stable. There is no new opacity on either side. Heart is upper normal in size with normal pulmonary vascularity. Pacemaker leads are attached to the right atrium and right ventricle. There is calcification in both carotid arteries. There is no appreciable adenopathy. No pneumothorax .  IMPRESSION: Patchy bibasilar atelectatic change, stable. No frank consolidation. No new opacity. No change in cardiac silhouette. No pneumothorax.   Electronically Signed   By: Lowella Grip M.D.   On: 10/31/2013 07:39   Dg Chest Port 1 View  10/27/2013   CLINICAL DATA:  Cough shortness of breath chest congestion  EXAM: PORTABLE CHEST - 1 VIEW  COMPARISON:  PA and lateral chest of October 24, 2013  FINDINGS: There is new increased density in the right hilar and infrahilar region with partial obscuration of the hemidiaphragm. Minimal atelectasis versus scarring of lateral costophrenic gutter on the left is noted. The cardiac silhouette is mildly enlarged. The pulmonary vascularity is normal. The permanent pacemaker is unchanged in appearance. The bony structures are unremarkable.  IMPRESSION: New increased density at the right lung base is consistent with atelectasis or pneumonia. There is no evidence of CHF.   Electronically Signed   By: David  Martinique   On: 10/27/2013 07:13   Dg Chest Portable 1 View  10/19/2013    CLINICAL DATA:  History of trauma from a fall.  EXAM: PORTABLE CHEST - 1 VIEW  COMPARISON:  Chest x-ray 02/05/2013.  FINDINGS: Lung volumes are low. Probable subsegmental atelectasis and/or scarring in the left lower lobe. Trace left pleural effusion. No pneumothorax. No consolidative airspace disease. Heart size appears borderline enlarged. Upper mediastinal contours are within normal limits. Atherosclerosis in the thoracic aorta. Left-sided pacemaker device in position with lead tips projecting over the expected location of the right atrium  and right ventricular outflow tract. Visualized bony thorax is grossly intact.  IMPRESSION: 1. Small left pleural effusion with mild subsegmental atelectasis and/or scarring in the left lung base. 2. No definite acute displaced rib fractures.  No pneumothorax. 3. Atherosclerosis.   Electronically Signed   By: Vinnie Langton M.D.   On: 10/19/2013 08:13    Microbiology: Recent Results (from the past 240 hour(s))  CULTURE, EXPECTORATED SPUTUM-ASSESSMENT     Status: None   Collection Time    10/30/13  1:40 PM      Result Value Ref Range Status   Specimen Description SPUTUM   Final   Special Requests Normal   Final   Sputum evaluation     Final   Value: THIS SPECIMEN IS ACCEPTABLE. RESPIRATORY CULTURE REPORT TO FOLLOW.   Report Status 10/30/2013 FINAL   Final  CULTURE, RESPIRATORY (NON-EXPECTORATED)     Status: None   Collection Time    10/30/13  1:40 PM      Result Value Ref Range Status   Specimen Description SPUTUM   Final   Special Requests NONE   Final   Gram Stain     Final   Value: MODERATE WBC PRESENT,BOTH PMN AND MONONUCLEAR     FEW SQUAMOUS EPITHELIAL CELLS PRESENT     RARE GRAM POSITIVE COCCI     IN PAIRS RARE Y     Performed at [facility]   Culture     Final   Value: FEW CANDIDA ALBICANS     Performed at [facility]   Report Status 11/01/2013 FINAL   Final  URINE CULTURE     Status: None   Collection Time    10/30/13   2:41 PM      Result Value Ref Range Status   Specimen Description URINE, CATHETERIZED   Final   Special Requests NONE   Final   Culture  Setup Time     Final   Value: 10/30/2013 18:40     Performed at [facility]     Final   Value: NO GROWTH     Performed at [facility]   Culture     Final   Value: NO GROWTH     Performed at [facility]   Report Status 10/31/2013 FINAL   Final  URINE CULTURE     Status: None   Collection Time    11/03/13  5:01 AM      Result Value Ref Range Status   Specimen Description URINE, CATHETERIZED   Final   Special Requests NONE   Final   Culture  Setup Time     Final   Value: 11/03/2013 05:14     Performed at [facility]     Final   Value: NO GROWTH     Performed at [facility]   Culture     Final   Value: NO GROWTH     Performed at [facility]   Report Status 11/04/2013 FINAL   Final     Labs: Basic Metabolic Panel:  Recent Labs Lab 11/01/13 0246 11/02/13 0212 11/03/13 0215 11/04/13 0319 11/05/13 0258 11/06/13 0005  NA 143 140 138 142 140 138  K 3.3* 3.6* 4.4 3.5* 4.1 4.1  CL 101 99 98 101 103 102  CO2 _0 GLUCOSE 48* 180* 195* 92 114* 200*  BUN 25* 27* 31* 30* 27*  28*  CREATININE 1.55* 1.75* 2.11* 1.91* 1.63* 1.50*  CALCIUM 8.7 8.6 8.2* 8.8 8.3* 8.4  MG 1.8 2.3 2.2 2.1 2.0  --    Liver Function Tests:  Recent Labs Lab 11/01/13 0246 11/02/13 0212 11/03/13 0215 11/04/13 0319 11/05/13 0258  AST _0 ALT _1 ALKPHOS 70 83 79 80 83  BILITOT 0.6 0.8 0.9 0.8 0.7  PROT 5.5* 5.5* 5.0* 5.2* 4.9*  ALBUMIN 2.4* 2.4* 2.2* 2.3* 2.1*   No results found for this basename: LIPASE, AMYLASE,  in the last 168 hours No results found for this basename: AMMONIA,  in the last 168 hours CBC:  Recent Labs Lab 11/01/13 0246 11/02/13 0212 11/03/13 0215 11/04/13 0319 11/05/13 0258 11/06/13 0305  WBC 22.5* 20.6* 17.1*  14.4* 13.8* 16.1*  NEUTROABS 21.1* 18.2* 15.7* 11.8* 11.0*  --   HGB 11.9* 13.2 11.7* 12.4* 12.3* 11.1*  HCT 36.2* 41.0 35.4* 39.7 38.7* 35.0*  MCV 93.8 94.0 93.7 94.5 96.3 96.2  PLT 246 239 223 263 272 294   Cardiac Enzymes: No results found for this basename: CKTOTAL, CKMB, CKMBINDEX, TROPONINI,  in the last 168 hours BNP: BNP (last 3 results)  Recent Labs  02/08/13 0630 10/19/13 0915 10/31/13 0500  PROBNP 5645.0* 527.0* 4249.0*   CBG:  Recent Labs Lab 11/05/13 1603 11/05/13 2007 11/06/13 0011 11/06/13 0408 11/06/13 0806  GLUCAP 210* 249* 198* 253* 169*       Signed:  Dia Crawford, MD [facility] [phone] pager

## 2013-11-08 ENCOUNTER — Encounter (HOSPITAL_COMMUNITY): Payer: Self-pay | Admitting: Emergency Medicine

## 2013-11-08 ENCOUNTER — Emergency Department (HOSPITAL_COMMUNITY): Payer: Medicare Other

## 2013-11-08 ENCOUNTER — Emergency Department (HOSPITAL_COMMUNITY)
Admission: EM | Admit: 2013-11-08 | Discharge: 2013-11-08 | Disposition: A | Discharge status: Home / Self Care | Payer: Medicare Other | Attending: Emergency Medicine | Admitting: Emergency Medicine

## 2013-11-08 DIAGNOSIS — Z8674 Personal history of sudden cardiac arrest: Secondary | ICD-10-CM | POA: Insufficient documentation

## 2013-11-08 DIAGNOSIS — Z794 Long term (current) use of insulin: Secondary | ICD-10-CM | POA: Insufficient documentation

## 2013-11-08 DIAGNOSIS — K4090 Unilateral inguinal hernia, without obstruction or gangrene, not specified as recurrent: Secondary | ICD-10-CM

## 2013-11-08 DIAGNOSIS — Z7901 Long term (current) use of anticoagulants: Secondary | ICD-10-CM | POA: Insufficient documentation

## 2013-11-08 DIAGNOSIS — I495 Sick sinus syndrome: Secondary | ICD-10-CM | POA: Insufficient documentation

## 2013-11-08 DIAGNOSIS — I129 Hypertensive chronic kidney disease with stage 1 through stage 4 chronic kidney disease, or unspecified chronic kidney disease: Secondary | ICD-10-CM | POA: Insufficient documentation

## 2013-11-08 DIAGNOSIS — Z950 Presence of cardiac pacemaker: Secondary | ICD-10-CM | POA: Insufficient documentation

## 2013-11-08 DIAGNOSIS — N183 Chronic kidney disease, stage 3 unspecified: Secondary | ICD-10-CM | POA: Insufficient documentation

## 2013-11-08 DIAGNOSIS — J45909 Unspecified asthma, uncomplicated: Secondary | ICD-10-CM | POA: Insufficient documentation

## 2013-11-08 DIAGNOSIS — Z8672 Personal history of thrombophlebitis: Secondary | ICD-10-CM | POA: Insufficient documentation

## 2013-11-08 DIAGNOSIS — I2510 Atherosclerotic heart disease of native coronary artery without angina pectoris: Secondary | ICD-10-CM | POA: Insufficient documentation

## 2013-11-08 DIAGNOSIS — I4891 Unspecified atrial fibrillation: Secondary | ICD-10-CM | POA: Insufficient documentation

## 2013-11-08 DIAGNOSIS — E119 Type 2 diabetes mellitus without complications: Secondary | ICD-10-CM | POA: Insufficient documentation

## 2013-11-08 DIAGNOSIS — Z8709 Personal history of other diseases of the respiratory system: Secondary | ICD-10-CM | POA: Insufficient documentation

## 2013-11-08 DIAGNOSIS — K8020 Calculus of gallbladder without cholecystitis without obstruction: Secondary | ICD-10-CM | POA: Insufficient documentation

## 2013-11-08 DIAGNOSIS — Z931 Gastrostomy status: Secondary | ICD-10-CM | POA: Insufficient documentation

## 2013-11-08 DIAGNOSIS — Z9861 Coronary angioplasty status: Secondary | ICD-10-CM | POA: Insufficient documentation

## 2013-11-08 DIAGNOSIS — Z79899 Other long term (current) drug therapy: Secondary | ICD-10-CM | POA: Insufficient documentation

## 2013-11-08 LAB — COMPREHENSIVE METABOLIC PANEL
ALT: 14 U/L (ref 0–53)
AST: 28 U/L (ref 0–37)
Albumin: 2.3 g/dL — ABNORMAL LOW (ref 3.5–5.2)
Alkaline Phosphatase: 101 U/L (ref 39–117)
BUN: 22 mg/dL (ref 6–23)
CO2: 30 mEq/L (ref 19–32)
Calcium: 8.9 mg/dL (ref 8.4–10.5)
Chloride: 100 mEq/L (ref 96–112)
Creatinine, Ser: 1.14 mg/dL (ref 0.50–1.35)
GFR calc Af Amer: 63 mL/min — ABNORMAL LOW (ref >90)
GFR calc non Af Amer: 54 mL/min — ABNORMAL LOW (ref >90)
Glucose, Bld: 247 mg/dL — ABNORMAL HIGH (ref 70–99)
Potassium: 3.7 mEq/L (ref 3.7–5.3)
Sodium: 142 mEq/L (ref 137–147)
Total Bilirubin: 0.5 mg/dL (ref 0.3–1.2)
Total Protein: 5.4 g/dL — ABNORMAL LOW (ref 6.0–8.3)

## 2013-11-08 LAB — CBC WITH DIFFERENTIAL/PLATELET
Basophils Absolute: 0 10*3/uL (ref 0.0–0.1)
Basophils Relative: 0 % (ref 0–1)
Eosinophils Absolute: 0.6 10*3/uL (ref 0.0–0.7)
Eosinophils Relative: 4 % (ref 0–5)
HCT: 35.3 % — ABNORMAL LOW (ref 39.0–52.0)
Hemoglobin: 11.3 g/dL — ABNORMAL LOW (ref 13.0–17.0)
Lymphocytes Relative: 9 % — ABNORMAL LOW (ref 12–46)
Lymphs Abs: 1.3 10*3/uL (ref 0.7–4.0)
MCH: 30.4 pg (ref 26.0–34.0)
MCHC: 32 g/dL (ref 30.0–36.0)
MCV: 94.9 fL (ref 78.0–100.0)
Monocytes Absolute: 1.3 10*3/uL — ABNORMAL HIGH (ref 0.1–1.0)
Monocytes Relative: 9 % (ref 3–12)
Neutro Abs: 11.2 10*3/uL — ABNORMAL HIGH (ref 1.7–7.7)
Neutrophils Relative %: 78 % — ABNORMAL HIGH (ref 43–77)
Platelets: 336 10*3/uL (ref 150–400)
RBC: 3.72 MIL/uL — ABNORMAL LOW (ref 4.22–5.81)
RDW: 15.5 % (ref 11.5–15.5)
WBC: 14.4 10*3/uL — ABNORMAL HIGH (ref 4.0–10.5)

## 2013-11-08 MED ORDER — HYDROCODONE-ACETAMINOPHEN 5-325 MG PO TABS
0.5000 | ORAL_TABLET | Freq: Once | ORAL | Status: AC
  Administered 2013-11-08: 0.5
  Filled 2013-11-08: qty 1

## 2013-11-08 MED ORDER — IOHEXOL 300 MG/ML  SOLN
100.0000 mL | Freq: Once | INTRAMUSCULAR | Status: AC | PRN
  Administered 2013-11-08: 100 mL via INTRAVENOUS

## 2013-11-08 MED ORDER — IOHEXOL 300 MG/ML  SOLN
25.0000 mL | Freq: Once | INTRAMUSCULAR | Status: AC | PRN
  Administered 2013-11-08: 25 mL via ORAL

## 2013-11-08 NOTE — ED Notes (Signed)
G tube placement checked.

## 2013-11-08 NOTE — ED Notes (Signed)
Back from CT.  Monitors on.

## 2013-11-08 NOTE — ED Provider Notes (Signed)
[name],[name] 8:45PM Pt discussed in sign out. Pt with dementia coming from skilled nursing facility. There was concerns for his abdominal distention. He is on continuous G-tube feeding. No signs of significant pains or other concerns. No fevers.  Initial laboratory testing without significant changes or concerns. A CT of the abdomen and pelvis is pending.  CT scan shows normal good placement of the G-tube. There is a very small residual free air following procedure as expected. One gallstone in the gallbladder without cholecystitis or obstruction. Small left fat containing inguinal hernia. No other concerning or acute findings on the abdominal CT scan. Patient was able to receive all the CT contrast through the G-tube without difficulty or problem. At this time he may return to the skilled nursing facility. We will have them continue his feedings later in the morning.  Martie Lee, PA-C 11/08/13 2127

## 2013-11-08 NOTE — ED Notes (Signed)
Called report to [facility].  Gave orders from PA to resume continuous feeds via Peg tube at 7am in the morning.  Patty has no further questions.

## 2013-11-08 NOTE — ED Notes (Addendum)
Called CT to advise contrast in.

## 2013-11-08 NOTE — ED Provider Notes (Signed)
CSN: [id number]     Arrival date & time 11/08/13  1705 History   First MD Initiated Contact with Patient 11/08/13 1744     Chief Complaint  Patient presents with  . Bloated    xray shows mod colonic ileus      (Consider location/radiation/quality/duration/timing/severity/associated sxs/prior Treatment) HPI  89yM with worsening abdominal distension. Just admitted with PAF with RVR as well as RLL infiltrate felt secondary to aspiration. He failed swallow study and had G tube placed 6/23. Pt is pleasant but not good historian. History primarily from review of hospital records. Records from facility scant. Essentially says worsening abdominal distension and XR consistent with colonic ileus. No additional notes of symptoms or official radiographic report. For what it's worth, pt denies any pain. No vomiting. He says he doesn't take anything by mouth.    Past Medical History  Diagnosis Date  . Presence of permanent cardiac pacemaker 04/2006 upgrade    a. s/p MDT Adapta ADDR01 dual chamber PPM, ser #: [id number] H.  [name] PAF (paroxysmal atrial fibrillation)     a. previously on coumadin-->patient self-d/c'd 11/2012 b/c he was tired of having INR's checked.  . Tachy-brady syndrome   . DM (diabetes mellitus)   . CAD (coronary artery disease)     DES to cx  . Pulmonary embolism 12/2003  . Deep vein thrombophlebitis of leg 2005, 2009    recurrent when off anticoag  . HTN (hypertension)   . Allergic rhinitis   . Asthma   . CKD (chronic kidney disease), stage III   . Nasal polyp    Past Surgical History  Procedure Laterality Date  . Cataract/lens implants    . Nasal polyp surgery    . Coronary angioplasty with stent placement  2008    DES to cx  . Pacemaker insertion  04/2006    Medtronic Adapta  . Cardioversion N/A 03/20/2013    Procedure: CARDIOVERSION;  Surgeon: Carlena Bjornstad, MD;  [facility] ENDOSCOPY;  Service: Cardiovascular;  Laterality: N/A;   Family History  Problem Relation Age  of Onset  . Diabetes Neg Hx   . Hypertension Neg Hx   . Coronary artery disease Neg Hx    History  Substance Use Topics  . Smoking status: Never Smoker   . Smokeless tobacco: Not on file     Comment: married since 1952 to wife Dub Mikes, retrired [employer]  . Alcohol Use: No    Review of Systems  Level 5 caveat because of dementia.  Allergies  Asa buff (mag and Ibuprofen  Home Medications   Prior to Admission medications   Medication Sig Start Date End Date Taking? Authorizing Provider  albuterol (PROVENTIL HFA;VENTOLIN HFA) 108 (90 BASE) MCG/ACT inhaler Inhale 2 puffs into the lungs every 4 (four) hours as needed for wheezing or shortness of breath. 10/20/13  Yes Shanker Kristeen Mans, MD  budesonide (PULMICORT) 0.25 MG/2ML nebulizer solution Take 0.25 mg by nebulization every 6 (six) hours as needed (shortness of breath).    Yes Historical Provider, MD  clonazePAM (KLONOPIN) 0.5 MG tablet 0.5 mg by Gastrostomy Tube route at bedtime as needed (sleep).    Yes Historical Provider, MD  diltiazem (CARDIZEM CD) 360 MG 24 hr capsule 360 mg by Gastrostomy Tube route daily.  09/08/13  Yes Historical Provider, MD  furosemide (LASIX) 40 MG tablet 40 mg by Gastrostomy Tube route daily.  08/25/13  Yes Historical Provider, MD  glipiZIDE (GLUCOTROL) 5 MG tablet 7.5 mg by Gastrostomy Tube route daily.  10/10/13  Yes Historical Provider, MD  guaiFENesin (MUCINEX) 600 MG 12 hr tablet 600 mg by Gastrostomy Tube route 2 (two) times daily. 9am and 5pm   Yes Historical Provider, MD  HYDROcodone-acetaminophen (NORCO/VICODIN) 5-325 MG per tablet 0.5-1 tablets by Gastrostomy Tube route every 8 (eight) hours as needed (0.5 tablet mild pain/ 1 tablet moderate pain).    Yes Historical Provider, MD  ipratropium (ATROVENT) 0.02 % nebulizer solution Take 0.5 mg by nebulization 4 (four) times daily - after meals and at bedtime. 02/11/13  Yes Debbe Odea, MD  levalbuterol (XOPENEX) 0.63 MG/3ML nebulizer solution Take 3  mLs (0.63 mg total) by nebulization every 6 (six) hours as needed for wheezing or shortness of breath. 10/20/13  Yes Shanker Kristeen Mans, MD  metoprolol tartrate (LOPRESSOR) 25 MG tablet Take 25 mg by mouth 2 (two) times daily. 9am and 5pm   Yes Historical Provider, MD  Nutritional Supplements (FEEDING SUPPLEMENT, GLUCERNA 1.2 CAL,) LIQD by Gastrostomy Tube route continuous. 60cc/hr   Yes Historical Provider, MD  Rivaroxaban (XARELTO) 15 MG TABS tablet 15 mg by Gastrostomy Tube route every evening. 5pm   Yes Historical Provider, MD  Blood Glucose Monitoring Suppl (ONE TOUCH ULTRA 2) W/DEVICE KIT Use to check blood sugars daily Dx 250.00 05/23/13   Rowe Clack, MD  digoxin (LANOXIN) 0.05 MG/ML solution Place 2.5 mLs (0.125 mg total) into feeding tube daily. 11/06/13   Allie Bossier, MD  diltiazem (CARDIZEM) 10 mg/ml oral suspension Place 9 mLs (90 mg total) into feeding tube every 6 (six) hours. 11/06/13   Allie Bossier, MD  enoxaparin (LOVENOX) 80 MG/0.8ML injection Inject 0.8 mLs (80 mg total) into the skin daily. 11/06/13   Allie Bossier, MD  glucose blood (ONE TOUCH ULTRA TEST) test strip Use to check blood sugars before breakfast Dx 250.00 05/23/13   Rowe Clack, MD  hydrALAZINE (APRESOLINE) 10 MG tablet Place 1 tablet (10 mg total) into feeding tube every 8 (eight) hours. 11/06/13   Allie Bossier, MD  insulin aspart (NOVOLOG FLEXPEN) 100 UNIT/ML FlexPen Inject 3 Units into the skin 3 (three) times daily with meals. 11/06/13   Allie Bossier, MD  insulin glargine (LANTUS) 100 UNIT/ML injection Inject 0.1 mLs (10 Units total) into the skin at bedtime. 11/06/13   Allie Bossier, MD  metoprolol tartrate (LOPRESSOR) 12.5 mg TABS tablet Place 0.5 tablets (12.5 mg total) into feeding tube 2 (two) times daily. 11/06/13   Allie Bossier, MD  Multiple Vitamins-Iron (QC DAILY MULTIVITAMINS/IRON) TABS 1 tablet by PEG Tube route daily. 11/06/13   Allie Bossier, MD   BP 158/59  Pulse 78  Temp(Src) 99.5  F (37.5 C) (Rectal)  Resp 17  SpO2 93% Physical Exam  Nursing note and vitals reviewed. Constitutional: He appears well-developed and well-nourished. No distress.  HENT:  Head: Normocephalic and atraumatic.  Eyes: Conjunctivae are normal. Right eye exhibits no discharge. Left eye exhibits no discharge.  Neck: Neck supple.  Cardiovascular: Normal rate, regular rhythm and normal heart sounds.  Exam reveals no gallop and no friction rub.   No murmur heard. Pulmonary/Chest: Effort normal and breath sounds normal. No respiratory distress.  Abdominal: Soft. He exhibits distension. There is no tenderness.  Gastric tube. Abdominal distention. No tenderness.  Musculoskeletal: He exhibits no edema and no tenderness.  Neurological: He is alert.  Skin: Skin is warm and dry.    ED Course  Procedures (including critical care time) Labs Review Labs Reviewed  CBC WITH DIFFERENTIAL - Abnormal; Notable for the following:    WBC 14.4 (*)    RBC 3.72 (*)    Hemoglobin 11.3 (*)    HCT 35.3 (*)    Neutrophils Relative % 78 (*)    Lymphocytes Relative 9 (*)    Neutro Abs 11.2 (*)    Monocytes Absolute 1.3 (*)    All other components within normal limits  COMPREHENSIVE METABOLIC PANEL - Abnormal; Notable for the following:    Glucose, Bld 247 (*)    Total Protein 5.4 (*)    Albumin 2.3 (*)    GFR calc non Af Amer 54 (*)    GFR calc Af Amer 63 (*)    All other components within normal limits    Imaging Review No results found.  Ct Abdomen Pelvis W Contrast  11/08/2013   CLINICAL DATA:  Abdominal distention.  EXAM: CT ABDOMEN AND PELVIS WITH CONTRAST  TECHNIQUE: Multidetector CT imaging of the abdomen and pelvis was performed using the standard protocol following bolus administration of intravenous contrast.  CONTRAST:  149m OMNIPAQUE IOHEXOL 300 MG/ML  SOLN  COMPARISON:  CT scan of May 26, 2013.  FINDINGS: Mild degenerative disc disease is noted throughout the lumbar spine. Mild  bilateral pleural effusions are noted with right greater than left. Adjacent subsegmental atelectasis is noted in the right lower lobe.  Solitary gallstone is noted. The liver, spleen and pancreas appear normal. Gastrostomy tube is in grossly good position. Small amount of pneumoperitoneum is noted consistent with the history of recent gastrostomy tube placement. Adrenal glands and kidneys appear normal. No hydronephrosis or renal obstruction is noted. Atherosclerotic calcifications of abdominal aorta are noted without aneurysm formation. Severe atherosclerotic stenosis is seen involving the origin of the superior mesenteric artery. There is no evidence of bowel obstruction. The appendix appears normal. Urinary bladder is decompressed secondary to Foley catheter. Sigmoid diverticulosis is noted without inflammation. No significant adenopathy is noted. Mild fat containing left inguinal hernia is noted. Mild anasarca is noted in the pelvis.  IMPRESSION: Mild anasarca noted in the pelvis.  Mild fat containing left inguinal hernia.  Gastrostomy tube is in grossly good position.  Solitary gallstone.  Sigmoid diverticulosis without inflammation.   Electronically Signed   By: JSabino DickM.D.   On: 11/08/2013 21:06    EKG Interpretation None      MDM   Final diagnoses:  Gastrostomy in place  Calculus of gallbladder without cholecystitis without obstruction  Unilateral inguinal hernia without obstruction or gangrene, recurrence not specified    89yM with abdominal distension. Apparently outpt XR with ileus but no complete records. Will CT to eval for possible mechanical obstruction. May simply be post-procedural ileus after g-tube placement?     SVirgel Manifold MD 11/11/13 [phone]

## 2013-11-08 NOTE — Discharge Instructions (Signed)
Your testing and CT scan of your abdomen and pelvis have not shown any signs for a concerning or emergent condition.  Your gastrostomy tube is in good place without any complications.  There is one gallstone in the gallbladder without complication. There is left inguinal hernia without any bowel obstruction or entrapment. At this time your provider(s) feel you may return home and follow up with your doctors. Return if you have any concerning or worsening changes.

## 2013-11-08 NOTE — ED Notes (Signed)
To room via EMS.  Pt lives at [place].  Pt here for abd distention, xray shows  Mod diffuse colonic ileus. Pt arouses to voice. FC 41F/30 cc balloon draining clear yellow urine.  Pt is DNR.

## 2013-11-08 NOTE — ED Notes (Signed)
Gave report to PT[facility] 

## 2013-11-09 NOTE — Assessment & Plan Note (Signed)
He always describes some productive cough with white/yellow sputum but it seems no worse

## 2013-11-09 NOTE — ED Provider Notes (Signed)
Medical screening examination/treatment/procedure(s) were performed by non-physician practitioner and as supervising physician I was immediately available for consultation/collaboration.   EKG Interpretation None       Orlie Dakin, MD 11/09/13 [phone]

## 2013-11-09 NOTE — Assessment & Plan Note (Signed)
Chronic problem for him. He, his wife and I have worked together on best arrangement for medication helped that avoids geriatric overtreatment as discussed with them.

## 2013-11-09 NOTE — Assessment & Plan Note (Signed)
Recurrence of polyps not seen

## 2013-11-10 ENCOUNTER — Other Ambulatory Visit: Payer: Self-pay | Admitting: *Deleted

## 2013-11-10 MED ORDER — HYDROCODONE-ACETAMINOPHEN 5-325 MG PO TABS: ORAL_TABLET | ORAL | Status: DC

## 2013-11-10 NOTE — Telephone Encounter (Signed)
[facility] 

## 2013-11-12 ENCOUNTER — Other Ambulatory Visit: Payer: Self-pay | Admitting: *Deleted

## 2013-11-12 MED ORDER — HYDROCODONE-ACETAMINOPHEN 5-325 MG PO TABS: ORAL_TABLET | ORAL | Status: DC

## 2013-11-13 ENCOUNTER — Non-Acute Institutional Stay (SKILLED_NURSING_FACILITY): Payer: Medicare Other | Admitting: Internal Medicine

## 2013-11-13 DIAGNOSIS — N183 Chronic kidney disease, stage 3 unspecified: Secondary | ICD-10-CM

## 2013-11-13 DIAGNOSIS — I2510 Atherosclerotic heart disease of native coronary artery without angina pectoris: Secondary | ICD-10-CM

## 2013-11-13 DIAGNOSIS — I495 Sick sinus syndrome: Secondary | ICD-10-CM

## 2013-11-13 DIAGNOSIS — I10 Essential (primary) hypertension: Secondary | ICD-10-CM

## 2013-11-13 DIAGNOSIS — Z950 Presence of cardiac pacemaker: Secondary | ICD-10-CM

## 2013-11-13 DIAGNOSIS — J690 Pneumonitis due to inhalation of food and vomit: Secondary | ICD-10-CM

## 2013-11-13 DIAGNOSIS — J439 Emphysema, unspecified: Secondary | ICD-10-CM

## 2013-11-13 DIAGNOSIS — I2699 Other pulmonary embolism without acute cor pulmonale: Secondary | ICD-10-CM

## 2013-11-13 DIAGNOSIS — J9600 Acute respiratory failure, unspecified whether with hypoxia or hypercapnia: Secondary | ICD-10-CM

## 2013-11-13 DIAGNOSIS — J438 Other emphysema: Secondary | ICD-10-CM

## 2013-11-13 DIAGNOSIS — K8020 Calculus of gallbladder without cholecystitis without obstruction: Secondary | ICD-10-CM

## 2013-11-13 DIAGNOSIS — I4891 Unspecified atrial fibrillation: Secondary | ICD-10-CM

## 2013-11-13 DIAGNOSIS — J9601 Acute respiratory failure with hypoxia: Secondary | ICD-10-CM

## 2013-11-13 DIAGNOSIS — I5032 Chronic diastolic (congestive) heart failure: Secondary | ICD-10-CM

## 2013-11-13 DIAGNOSIS — N3000 Acute cystitis without hematuria: Secondary | ICD-10-CM

## 2013-11-15 ENCOUNTER — Encounter: Payer: Self-pay | Admitting: Internal Medicine

## 2013-11-15 DIAGNOSIS — K8020 Calculus of gallbladder without cholecystitis without obstruction: Secondary | ICD-10-CM | POA: Insufficient documentation

## 2013-11-15 DIAGNOSIS — I495 Sick sinus syndrome: Secondary | ICD-10-CM | POA: Insufficient documentation

## 2013-11-15 DIAGNOSIS — N390 Urinary tract infection, site not specified: Secondary | ICD-10-CM | POA: Insufficient documentation

## 2013-11-15 DIAGNOSIS — J449 Chronic obstructive pulmonary disease, unspecified: Secondary | ICD-10-CM | POA: Insufficient documentation

## 2013-11-15 NOTE — Assessment & Plan Note (Signed)
S/p DES to cx

## 2013-11-15 NOTE — Assessment & Plan Note (Signed)
Unlikely to improve however patient being discharged to [facility], recommend swallow eval at least one time per week, hopefully as patient regains strength will be able to at least tolerate palliative feedings without aspiration

## 2013-11-15 NOTE — Assessment & Plan Note (Signed)
Course of Zosyn completed stopped on 6/22  -Renal ultrasound nondiagnostic, PCP to monitor renal status, if worsening consider outpatient Nephrology consult

## 2013-11-15 NOTE — Assessment & Plan Note (Signed)
-  Secondary patient's renal status will discharge on full dose Lovenox (80 mg daily) instead on his previous Xarelto  Please note that xarelto was listed as d/c med, not lovenox

## 2013-11-15 NOTE — Assessment & Plan Note (Signed)
Allow permissive 2/2 age

## 2013-11-15 NOTE — Assessment & Plan Note (Signed)
appears well compensated at this time

## 2013-11-15 NOTE — Assessment & Plan Note (Signed)
-  Baseline GFR 30-59 - crt appears to be at baseline  -At discharge Cr= 1.50

## 2013-11-15 NOTE — Assessment & Plan Note (Signed)
S/p pacer placement

## 2013-11-15 NOTE — Assessment & Plan Note (Signed)
old records suggest prior fear of previous lung toxicity on amio  -Not really rate controlled on digoxin 0.125 mg daily as single agent  -Discharge on Cardizem 90 mg solution via PEG q 6hr  -Discharge on digoxin 0.125 mg solution via PEG daily  -Discharge on hydralazine 10 mg per PEG tube TID  -Discharge on metoprolol 12.5 mg per PEG tube BID  Please note that these meds and meds on d/c summary are not the same!

## 2013-11-15 NOTE — Assessment & Plan Note (Signed)
6/19 per speech evaluation; Pt continues to demonstrate immediate evidence of aspiration with thin liquids and delayed evidence of aspiration with nectar thick liquids, possibly also due to prior dx of esophageal dysphagia  -Completed full treatment course for aspiration pneumonitis/pneumonia  -Continue budesonide nebulizer BID  -Continue ipratropium nebulizer QID  -Continue Xopenex nebulizerQID  -Continue Mucinex BID  -Flutter valve q 4hr while awake

## 2013-11-15 NOTE — Assessment & Plan Note (Signed)
6/7 Hemoglobin A1c =9.3  - Discharge on Lantus 10 units QHS  -Decrease Lantus to 10 units daily  -Discharge NovoLog 3 units QHS,

## 2013-11-15 NOTE — Assessment & Plan Note (Signed)
CT abdom unrevealing  - [place] noted 1.6 cm gallstone in gallbladder neck with significant sludge in gallbladder but no definite evidence of acute cholecystitis  - HIDA scan not c/w acute cholecystitis  -On day of discharge denies abdom pain

## 2013-11-15 NOTE — Progress Notes (Signed)
MRN: [id number] Name: [name]  Sex: male Age: 80 y.o. DOB: 1920-08-03  [place] #: Helene Kelp Facility/Room: 215 Level Of Care: SNF Provider: Inocencio Homes D Emergency Contacts: Extended Emergency Contact Information Primary Emergency Contact: Golding,Frances M Address: [address]          [place], [place] [address] [place] of [place] Phone: [phone] Relation: Spouse Secondary Emergency Contact: Metheney,Jerry  [place] Work Phone: [phone] Relation: Son  Code Status:   Allergies: Asa buff (mag and Ibuprofen  Chief Complaint  Patient presents with  . nursing home admission    HPI: Patient is 80 y.o. male who is admitted to SNF after a hospitalization for aspiration PNA complicated by PAF.  Past Medical History  Diagnosis Date  . Presence of permanent cardiac pacemaker 04/2006 upgrade    a. s/p MDT Adapta ADDR01 dual chamber PPM, ser #: [id number] H.  [name] PAF (paroxysmal atrial fibrillation)     a. previously on coumadin-->patient self-d/c'd 11/2012 b/c he was tired of having INR's checked.  . Tachy-brady syndrome   . DM (diabetes mellitus)   . CAD (coronary artery disease)     DES to cx  . Pulmonary embolism 12/2003  . Deep vein thrombophlebitis of leg 2005, 2009    recurrent when off anticoag  . HTN (hypertension)   . Allergic rhinitis   . Asthma   . CKD (chronic kidney disease), stage III   . Nasal polyp     Past Surgical History  Procedure Laterality Date  . Cataract/lens implants    . Nasal polyp surgery    . Coronary angioplasty with stent placement  2008    DES to cx  . Pacemaker insertion  04/2006    Medtronic Adapta  . Cardioversion N/A 03/20/2013    Procedure: CARDIOVERSION;  Surgeon: Carlena Bjornstad, MD;  Location: [facility] ENDOSCOPY;  Service: Cardiovascular;  Laterality: N/A;      Medication List       This list is accurate as of: 11/13/13 11:59 PM.  Always use your most recent med list.               albuterol  108 (90 BASE) MCG/ACT inhaler  Commonly known as:  PROVENTIL HFA;VENTOLIN HFA  Inhale 2 puffs into the lungs every 4 (four) hours as needed for wheezing or shortness of breath.     budesonide 0.25 MG/2ML nebulizer solution  Commonly known as:  PULMICORT  Take 0.25 mg by nebulization every 6 (six) hours as needed (shortness of breath).     digoxin 0.05 MG/ML solution  Commonly known as:  LANOXIN  Place 2.5 mLs (0.125 mg total) into feeding tube daily.     diltiazem 10 mg/ml oral suspension  Commonly known as:  CARDIZEM  Place 9 mLs (90 mg total) into feeding tube every 6 (six) hours.     enoxaparin 80 MG/0.8ML injection  Commonly known as:  LOVENOX  Inject 0.8 mLs (80 mg total) into the skin daily.     feeding supplement (GLUCERNA 1.2 CAL) Liqd  by Gastrostomy Tube route continuous. 60cc/hr     furosemide 40 MG tablet  Commonly known as:  LASIX  40 mg by Gastrostomy Tube route daily.     glucose blood test strip  Commonly known as:  ONE TOUCH ULTRA TEST  - Use to check blood sugars before breakfast  - Dx 250.00     guaiFENesin 600 MG 12 hr tablet  Commonly known as:  MUCINEX  600  mg by Gastrostomy Tube route 2 (two) times daily. 9am and 5pm     hydrALAZINE 10 MG tablet  Commonly known as:  APRESOLINE  Place 1 tablet (10 mg total) into feeding tube every 8 (eight) hours.     HYDROcodone-acetaminophen 5-325 MG per tablet  Commonly known as:  NORCO/VICODIN  Take 1 tablet per tube every 6 hours as needed for mild pain; Take 2 tablets (10/650 mg) per tube every 6 hours as needed for moderate pain.     insulin aspart 100 UNIT/ML FlexPen  Commonly known as:  NOVOLOG FLEXPEN  Inject 3 Units into the skin 3 (three) times daily with meals.     insulin glargine 100 UNIT/ML injection  Commonly known as:  LANTUS  Inject 10 Units into the skin at bedtime. And 10 units q day     ipratropium 0.02 % nebulizer solution  Commonly known as:  ATROVENT  Take 0.5 mg by nebulization 4  (four) times daily - after meals and at bedtime.     levalbuterol 0.63 MG/3ML nebulizer solution  Commonly known as:  XOPENEX  Take 3 mLs (0.63 mg total) by nebulization every 6 (six) hours as needed for wheezing or shortness of breath.     metoprolol tartrate 12.5 mg Tabs tablet  Commonly known as:  LOPRESSOR  Place 0.5 tablets (12.5 mg total) into feeding tube 2 (two) times daily.     ONE TOUCH ULTRA 2 W/DEVICE Kit  - Use to check blood sugars daily  - Dx 250.00     QC DAILY MULTIVITAMINS/IRON Tabs  1 tablet by PEG Tube route daily.        Meds ordered this encounter  Medications  . insulin glargine (LANTUS) 100 UNIT/ML injection    Sig: Inject 10 Units into the skin at bedtime. And 10 units q day    Immunization History  Administered Date(s) Administered  . Influenza Split 02/03/2011, 02/12/2012  . Influenza Whole 02/12/2009  . Influenza,inj,Quad PF,36+ Mos 01/21/2013  . Pneumococcal Polysaccharide-23 02/13/2008    History  Substance Use Topics  . Smoking status: Never Smoker   . Smokeless tobacco: Not on file     Comment: married since 1952 to wife Dub Mikes, retrired [employer]  . Alcohol Use: No    Family history is noncontributory    Review of Systems  DATA OBTAINED: from patient GENERAL: Feels well no fevers, fatigue, appetite changes SKIN: No itching, rash or wounds EYES: No eye pain, redness, discharge EARS: No earache, tinnitus, change in hearing NOSE: No congestion, drainage or bleeding  MOUTH/THROAT: No mouth or tooth pain, No sore throat  RESPIRATORY: No cough, wheezing, SOB CARDIAC: No chest pain, palpitations, lower extremity edema  GI: No abdominal pain, No N/V/D or constipation, No heartburn or reflux  GU: No dysuria, frequency or urgency, or incontinence  MUSCULOSKELETAL: No unrelieved bone/joint pain NEUROLOGIC: No headache, dizziness or focal weakness PSYCHIATRIC: No overt anxiety or sadness. Sleeps well. No behavior issue.   Filed  Vitals:   11/13/13 2205  BP: 122/76  Pulse: 65  Temp: 97.9 F (36.6 C)  Resp: 20    Physical Exam  GENERAL APPEARANCE: Alert, conversant. Appropriately groomed. No acute distress.  SKIN: No diaphoresis rash HEAD: Normocephalic, atraumatic  EYES: Conjunctiva/lids clear. Pupils round, reactive. EOMs intact.  EARS: External exam WNL, canals clear. Hearing grossly normal.  NOSE: No deformity or discharge.  MOUTH/THROAT: Lips w/o lesions RESPIRATORY: Breathing is even, unlabored. Lung sounds are clear   CARDIOVASCULAR: Heart RRR  no murmurs, rubs or gallops. No peripheral edema.  GASTROINTESTINAL: Abdomen is soft, non-tender, not distended w/ normal bowel sounds GENITOURINARY: Bladder non tender, not distended  MUSCULOSKELETAL: No abnormal joints or musculature NEUROLOGIC:  Cranial nerves 2-12 grossly intact. Moves all extremities  PSYCHIATRIC: Mood and affect appropriate to situation, no behavioral issues  Patient Active Problem List   Diagnosis Date Noted  . Cholelithiasis 11/15/2013  . UTI (urinary tract infection) 11/15/2013  . COPD (chronic obstructive pulmonary disease) 11/15/2013  . Tachy-brady syndrome 11/15/2013  . Hypokalemia 11/01/2013  . Hypomagnesemia 11/01/2013  . Aspiration pneumonia 10/31/2013  . Hypernatremia 10/30/2013  . History of DVT (deep vein thrombosis) 10/30/2013  . Atrial fibrillation with rapid ventricular response 10/24/2013  . Sepsis 10/24/2013  . Acute respiratory failure with hypoxia 10/19/2013  . Chronic renal disease, stage 3, moderately decreased glomerular filtration rate between 30-59 mL/min/1.73 square meter 02/11/2013  . Acute and chronic respiratory failure 02/08/2013  . Chronic diastolic heart failure 45/80/9983  . Acute encephalopathy 02/08/2013  . COPD exacerbation 02/05/2013  . Chronic steroid use 02/05/2013  . Hypoxemia 02/05/2013  . Dyslipidemia   . PAD (peripheral artery disease) 01/31/2012  . Ankylosing spondylitis 12/17/2011   . Pacemaker- medtronic 12/16/2010  . Sinus node dysfunction 12/16/2010  . Atrial fibrillation 10/13/2008  . RENAL INSUFFICIENCY 07/24/2008  . INSOMNIA 05/25/2008  . Embolism, pulmonary with infarction, hx of 11/02/2007  . DM (diabetes mellitus), type 2 with renal complications 38/25/0539  . HYPERTENSION 03/22/2007  . CAD 03/22/2007  . DEEP VENOUS THROMBOPHLEBITIS 03/22/2007  . NASAL POLYP 03/22/2007  . ALLERGIC RHINITIS 03/22/2007  . Asthma with bronchitis 03/22/2007    CBC    Component Value Date/Time   WBC 14.4* 11/08/2013 1823   RBC 3.72* 11/08/2013 1823   HGB 11.3* 11/08/2013 1823   HCT 35.3* 11/08/2013 1823   PLT 336 11/08/2013 1823   MCV 94.9 11/08/2013 1823   LYMPHSABS 1.3 11/08/2013 1823   MONOABS 1.3* 11/08/2013 1823   EOSABS 0.6 11/08/2013 1823   BASOSABS 0.0 11/08/2013 1823    CMP     Component Value Date/Time   NA 142 11/08/2013 1823   K 3.7 11/08/2013 1823   CL 100 11/08/2013 1823   CO2 30 11/08/2013 1823   GLUCOSE 247* 11/08/2013 1823   BUN 22 11/08/2013 1823   CREATININE 1.14 11/08/2013 1823   CALCIUM 8.9 11/08/2013 1823   PROT 5.4* 11/08/2013 1823   ALBUMIN 2.3* 11/08/2013 1823   AST 28 11/08/2013 1823   ALT 14 11/08/2013 1823   ALKPHOS 101 11/08/2013 1823   BILITOT 0.5 11/08/2013 1823   GFRNONAA 54* 11/08/2013 1823   GFRAA 63* 11/08/2013 1823    Assessment and Plan  Atrial fibrillation with rapid ventricular response old records suggest prior fear of previous lung toxicity on amio  -Not really rate controlled on digoxin 0.125 mg daily as single agent  -Discharge on Cardizem 90 mg solution via PEG q 6hr  -Discharge on digoxin 0.125 mg solution via PEG daily  -Discharge on hydralazine 10 mg per PEG tube TID  -Discharge on metoprolol 12.5 mg per PEG tube BID  Please note that these meds and meds on d/c summary are not the same!  Acute respiratory failure with hypoxia 6/19 per speech evaluation; Pt continues to demonstrate immediate evidence of aspiration with  thin liquids and delayed evidence of aspiration with nectar thick liquids, possibly also due to prior dx of esophageal dysphagia  -Completed full treatment course for aspiration pneumonitis/pneumonia  -Continue  budesonide nebulizer BID  -Continue ipratropium nebulizer QID  -Continue Xopenex nebulizerQID  -Continue Mucinex BID  -Flutter valve q 4hr while awake    Aspiration pneumonia Unlikely to improve however patient being discharged to [facility], recommend swallow eval at least one time per week, hopefully as patient regains strength will be able to at least tolerate palliative feedings without aspiration   Cholelithiasis CT abdom unrevealing  - [place] noted 1.6 cm gallstone in gallbladder neck with significant sludge in gallbladder but no definite evidence of acute cholecystitis  - HIDA scan not c/w acute cholecystitis  -On day of discharge denies abdom pain    UTI (urinary tract infection) Course of Zosyn completed stopped on 6/22  -Renal ultrasound nondiagnostic, PCP to monitor renal status, if worsening consider outpatient Nephrology consult    COPD (chronic obstructive pulmonary disease) At baseline, no O2 needed  Tachy-brady syndrome S/p pacer placement  DM (diabetes mellitus), type 2 with renal complications 6/7 Hemoglobin A1c =9.3  - Discharge on Lantus 10 units QHS  -Decrease Lantus to 10 units daily  -Discharge NovoLog 3 units QHS,      CAD S/p DES to cx  Chronic diastolic heart failure appears well compensated at this time    Embolism, pulmonary with infarction, hx of -Secondary patient's renal status will discharge on full dose Lovenox (80 mg daily) instead on his previous Xarelto  Please note that xarelto was listed as d/c med, not lovenox  HYPERTENSION Allow permissive 2/2 age  Chronic renal disease, stage 3, moderately decreased glomerular filtration rate between 30-59 mL/min/1.73 square meter -Baseline GFR 30-59 - crt appears to be at baseline   -At discharge Cr= 1.50    PLEASE NOTE!!! The medication list that came with the d/c summary from the hospital was DIFFERENT from the medication list that was in epic which was DIFFERENT from the meds outlined in the body of the discharge summary. I chose to use the medications as listed in the body of the note but there is still some confusion because of the medications that appear in Epic.  Hennie Duos, MD

## 2013-11-15 NOTE — Assessment & Plan Note (Signed)
At baseline, no O2 needed

## 2013-11-17 ENCOUNTER — Inpatient Hospital Stay (HOSPITAL_COMMUNITY)
Admission: EM | Admit: 2013-11-17 | Discharge: 2013-12-13 | DRG: 291 | Disposition: E | Payer: Medicare Other | Attending: Internal Medicine | Admitting: Internal Medicine

## 2013-11-17 ENCOUNTER — Encounter (HOSPITAL_COMMUNITY): Payer: Self-pay | Admitting: Emergency Medicine

## 2013-11-17 ENCOUNTER — Emergency Department (HOSPITAL_COMMUNITY): Payer: Medicare Other

## 2013-11-17 ENCOUNTER — Encounter: Payer: Self-pay | Admitting: Internal Medicine

## 2013-11-17 DIAGNOSIS — I495 Sick sinus syndrome: Secondary | ICD-10-CM | POA: Diagnosis present

## 2013-11-17 DIAGNOSIS — Z931 Gastrostomy status: Secondary | ICD-10-CM

## 2013-11-17 DIAGNOSIS — Z515 Encounter for palliative care: Secondary | ICD-10-CM

## 2013-11-17 DIAGNOSIS — L8992 Pressure ulcer of unspecified site, stage 2: Secondary | ICD-10-CM | POA: Diagnosis present

## 2013-11-17 DIAGNOSIS — Z66 Do not resuscitate: Secondary | ICD-10-CM | POA: Diagnosis present

## 2013-11-17 DIAGNOSIS — E1129 Type 2 diabetes mellitus with other diabetic kidney complication: Secondary | ICD-10-CM | POA: Diagnosis present

## 2013-11-17 DIAGNOSIS — Z9861 Coronary angioplasty status: Secondary | ICD-10-CM

## 2013-11-17 DIAGNOSIS — N183 Chronic kidney disease, stage 3 unspecified: Secondary | ICD-10-CM | POA: Diagnosis present

## 2013-11-17 DIAGNOSIS — I4891 Unspecified atrial fibrillation: Secondary | ICD-10-CM | POA: Diagnosis present

## 2013-11-17 DIAGNOSIS — J9620 Acute and chronic respiratory failure, unspecified whether with hypoxia or hypercapnia: Secondary | ICD-10-CM

## 2013-11-17 DIAGNOSIS — I2699 Other pulmonary embolism without acute cor pulmonale: Secondary | ICD-10-CM | POA: Diagnosis present

## 2013-11-17 DIAGNOSIS — Z950 Presence of cardiac pacemaker: Secondary | ICD-10-CM

## 2013-11-17 DIAGNOSIS — L8990 Pressure ulcer of unspecified site, unspecified stage: Secondary | ICD-10-CM | POA: Diagnosis present

## 2013-11-17 DIAGNOSIS — I129 Hypertensive chronic kidney disease with stage 1 through stage 4 chronic kidney disease, or unspecified chronic kidney disease: Secondary | ICD-10-CM | POA: Diagnosis present

## 2013-11-17 DIAGNOSIS — G9340 Encephalopathy, unspecified: Secondary | ICD-10-CM

## 2013-11-17 DIAGNOSIS — R1310 Dysphagia, unspecified: Secondary | ICD-10-CM

## 2013-11-17 DIAGNOSIS — Z886 Allergy status to analgesic agent status: Secondary | ICD-10-CM

## 2013-11-17 DIAGNOSIS — Z794 Long term (current) use of insulin: Secondary | ICD-10-CM

## 2013-11-17 DIAGNOSIS — Z7901 Long term (current) use of anticoagulants: Secondary | ICD-10-CM

## 2013-11-17 DIAGNOSIS — J449 Chronic obstructive pulmonary disease, unspecified: Secondary | ICD-10-CM | POA: Diagnosis present

## 2013-11-17 DIAGNOSIS — Z6825 Body mass index (BMI) 25.0-25.9, adult: Secondary | ICD-10-CM

## 2013-11-17 DIAGNOSIS — N390 Urinary tract infection, site not specified: Secondary | ICD-10-CM

## 2013-11-17 DIAGNOSIS — I509 Heart failure, unspecified: Secondary | ICD-10-CM

## 2013-11-17 DIAGNOSIS — N058 Unspecified nephritic syndrome with other morphologic changes: Secondary | ICD-10-CM | POA: Diagnosis present

## 2013-11-17 DIAGNOSIS — J4489 Other specified chronic obstructive pulmonary disease: Secondary | ICD-10-CM | POA: Diagnosis present

## 2013-11-17 DIAGNOSIS — L89609 Pressure ulcer of unspecified heel, unspecified stage: Secondary | ICD-10-CM | POA: Diagnosis present

## 2013-11-17 DIAGNOSIS — L89109 Pressure ulcer of unspecified part of back, unspecified stage: Secondary | ICD-10-CM | POA: Diagnosis present

## 2013-11-17 DIAGNOSIS — J9601 Acute respiratory failure with hypoxia: Secondary | ICD-10-CM | POA: Diagnosis present

## 2013-11-17 DIAGNOSIS — R627 Adult failure to thrive: Secondary | ICD-10-CM | POA: Diagnosis present

## 2013-11-17 DIAGNOSIS — I5033 Acute on chronic diastolic (congestive) heart failure: Principal | ICD-10-CM | POA: Diagnosis present

## 2013-11-17 DIAGNOSIS — I5032 Chronic diastolic (congestive) heart failure: Secondary | ICD-10-CM | POA: Diagnosis present

## 2013-11-17 DIAGNOSIS — E1122 Type 2 diabetes mellitus with diabetic chronic kidney disease: Secondary | ICD-10-CM

## 2013-11-17 DIAGNOSIS — R0902 Hypoxemia: Secondary | ICD-10-CM

## 2013-11-17 DIAGNOSIS — I2510 Atherosclerotic heart disease of native coronary artery without angina pectoris: Secondary | ICD-10-CM | POA: Diagnosis present

## 2013-11-17 DIAGNOSIS — Z86711 Personal history of pulmonary embolism: Secondary | ICD-10-CM

## 2013-11-17 HISTORY — DX: Presence of cardiac pacemaker: Z95.0

## 2013-11-17 HISTORY — DX: Pure hypercholesterolemia, unspecified: E78.00

## 2013-11-17 HISTORY — DX: Basal cell carcinoma of skin of scalp and neck: C44.41

## 2013-11-17 HISTORY — DX: Chronic obstructive pulmonary disease, unspecified: J44.9

## 2013-11-17 HISTORY — DX: Pneumonia, unspecified organism: J18.9

## 2013-11-17 HISTORY — DX: Unspecified chronic bronchitis: J42

## 2013-11-17 HISTORY — DX: Type 2 diabetes mellitus without complications: E11.9

## 2013-11-17 HISTORY — DX: Heart failure, unspecified: I50.9

## 2013-11-17 LAB — URINALYSIS, ROUTINE W REFLEX MICROSCOPIC
BILIRUBIN URINE: NEGATIVE
Glucose, UA: NEGATIVE mg/dL
KETONES UR: NEGATIVE mg/dL
Nitrite: NEGATIVE
Protein, ur: 30 mg/dL — AB
Specific Gravity, Urine: 1.022 (ref 1.005–1.030)
UROBILINOGEN UA: 1 mg/dL (ref 0.0–1.0)
pH: 5 (ref 5.0–8.0)

## 2013-11-17 LAB — COMPREHENSIVE METABOLIC PANEL
ALK PHOS: 140 U/L — AB (ref 39–117)
ALT: 15 U/L (ref 0–53)
AST: 29 U/L (ref 0–37)
Albumin: 2.1 g/dL — ABNORMAL LOW (ref 3.5–5.2)
Anion gap: 13 (ref 5–15)
BILIRUBIN TOTAL: 0.4 mg/dL (ref 0.3–1.2)
BUN: 43 mg/dL — ABNORMAL HIGH (ref 6–23)
CHLORIDE: 91 meq/L — AB (ref 96–112)
CO2: 30 meq/L (ref 19–32)
Calcium: 8.5 mg/dL (ref 8.4–10.5)
Creatinine, Ser: 1.34 mg/dL (ref 0.50–1.35)
GFR, EST AFRICAN AMERICAN: 52 mL/min — AB (ref >90)
GFR, EST NON AFRICAN AMERICAN: 45 mL/min — AB (ref >90)
GLUCOSE: 354 mg/dL — AB (ref 70–99)
POTASSIUM: 4.8 meq/L (ref 3.7–5.3)
SODIUM: 134 meq/L — AB (ref 137–147)
TOTAL PROTEIN: 5.5 g/dL — AB (ref 6.0–8.3)

## 2013-11-17 LAB — CBC WITH DIFFERENTIAL/PLATELET
Basophils Absolute: 0 10*3/uL (ref 0.0–0.1)
Basophils Relative: 0 % (ref 0–1)
Eosinophils Absolute: 0.4 10*3/uL (ref 0.0–0.7)
Eosinophils Relative: 3 % (ref 0–5)
HCT: 34.3 % — ABNORMAL LOW (ref 39.0–52.0)
Hemoglobin: 10.8 g/dL — ABNORMAL LOW (ref 13.0–17.0)
LYMPHS PCT: 8 % — AB (ref 12–46)
Lymphs Abs: 1.3 10*3/uL (ref 0.7–4.0)
MCH: 30.7 pg (ref 26.0–34.0)
MCHC: 31.5 g/dL (ref 30.0–36.0)
MCV: 97.4 fL (ref 78.0–100.0)
Monocytes Absolute: 1.3 10*3/uL — ABNORMAL HIGH (ref 0.1–1.0)
Monocytes Relative: 8 % (ref 3–12)
NEUTROS PCT: 81 % — AB (ref 43–77)
Neutro Abs: 12.1 10*3/uL — ABNORMAL HIGH (ref 1.7–7.7)
PLATELETS: 271 10*3/uL (ref 150–400)
RBC: 3.52 MIL/uL — AB (ref 4.22–5.81)
RDW: 15.9 % — ABNORMAL HIGH (ref 11.5–15.5)
WBC: 15 10*3/uL — ABNORMAL HIGH (ref 4.0–10.5)

## 2013-11-17 LAB — I-STAT ARTERIAL BLOOD GAS, ED
Acid-Base Excess: 8 mmol/L — ABNORMAL HIGH (ref 0.0–2.0)
Bicarbonate: 33.5 mEq/L — ABNORMAL HIGH (ref 20.0–24.0)
O2 SAT: 96 %
PCO2 ART: 50.2 mmHg — AB (ref 35.0–45.0)
PO2 ART: 83 mmHg (ref 80.0–100.0)
TCO2: 35 mmol/L (ref 0–100)
pH, Arterial: 7.433 (ref 7.350–7.450)

## 2013-11-17 LAB — I-STAT TROPONIN, ED: Troponin i, poc: 0.03 ng/mL (ref 0.00–0.08)

## 2013-11-17 LAB — URINE MICROSCOPIC-ADD ON

## 2013-11-17 LAB — I-STAT CG4 LACTIC ACID, ED: Lactic Acid, Venous: 1.45 mmol/L (ref 0.5–2.2)

## 2013-11-17 LAB — MRSA PCR SCREENING: MRSA by PCR: NEGATIVE

## 2013-11-17 LAB — PRO B NATRIURETIC PEPTIDE: Pro B Natriuretic peptide (BNP): 5914 pg/mL — ABNORMAL HIGH (ref 0–450)

## 2013-11-17 MED ORDER — ALBUTEROL SULFATE HFA 108 (90 BASE) MCG/ACT IN AERS: 2.0000 | INHALATION_SPRAY | RESPIRATORY_TRACT | Status: DC | PRN

## 2013-11-17 MED ORDER — IPRATROPIUM BROMIDE 0.02 % IN SOLN: 0.5000 mg | RESPIRATORY_TRACT | Status: DC | PRN

## 2013-11-17 MED ORDER — FUROSEMIDE 10 MG/ML IJ SOLN
40.0000 mg | Freq: Two times a day (BID) | INTRAMUSCULAR | Status: DC
  Filled 2013-11-17: qty 4

## 2013-11-17 MED ORDER — ENOXAPARIN SODIUM 80 MG/0.8ML ~~LOC~~ SOLN
80.0000 mg | SUBCUTANEOUS | Status: DC
  Administered 2013-11-18: 80 mg via SUBCUTANEOUS
  Filled 2013-11-17 (×2): qty 0.8

## 2013-11-17 MED ORDER — ACETAMINOPHEN 650 MG RE SUPP: 650.0000 mg | Freq: Four times a day (QID) | RECTAL | Status: DC | PRN

## 2013-11-17 MED ORDER — GUAIFENESIN-DM 100-10 MG/5ML PO SYRP: 15.0000 mL | ORAL_SOLUTION | Freq: Four times a day (QID) | ORAL | Status: DC | PRN

## 2013-11-17 MED ORDER — ONDANSETRON HCL 4 MG/2ML IJ SOLN: 4.0000 mg | Freq: Four times a day (QID) | INTRAMUSCULAR | Status: DC | PRN

## 2013-11-17 MED ORDER — OLOPATADINE HCL 0.1 % OP SOLN
1.0000 [drp] | Freq: Two times a day (BID) | OPHTHALMIC | Status: DC
  Administered 2013-11-18 (×2): 1 [drp] via OPHTHALMIC
  Filled 2013-11-17: qty 5

## 2013-11-17 MED ORDER — METOPROLOL TARTRATE 1 MG/ML IV SOLN: 10.0000 mg | Freq: Four times a day (QID) | INTRAVENOUS | Status: DC | PRN

## 2013-11-17 MED ORDER — METOPROLOL TARTRATE 12.5 MG HALF TABLET
12.5000 mg | ORAL_TABLET | Freq: Two times a day (BID) | ORAL | Status: DC
  Administered 2013-11-18 (×2): 12.5 mg
  Filled 2013-11-17 (×4): qty 1

## 2013-11-17 MED ORDER — DEXTROSE 5 % IV SOLN
1.0000 g | Freq: Once | INTRAVENOUS | Status: AC
  Administered 2013-11-17: 1 g via INTRAVENOUS
  Filled 2013-11-17: qty 10

## 2013-11-17 MED ORDER — FUROSEMIDE 10 MG/ML IJ SOLN
40.0000 mg | Freq: Once | INTRAMUSCULAR | Status: AC
  Administered 2013-11-17: 40 mg via INTRAVENOUS
  Filled 2013-11-17: qty 4

## 2013-11-17 MED ORDER — DIGOXIN 0.05 MG/ML PO SOLN
0.1250 mg | Freq: Every day | ORAL | Status: DC
  Administered 2013-11-18: 0.125 mg
  Filled 2013-11-17: qty 2.5

## 2013-11-17 MED ORDER — ALBUTEROL SULFATE (2.5 MG/3ML) 0.083% IN NEBU: 2.5000 mg | INHALATION_SOLUTION | RESPIRATORY_TRACT | Status: DC | PRN

## 2013-11-17 MED ORDER — MORPHINE SULFATE 2 MG/ML IJ SOLN
1.0000 mg | INTRAMUSCULAR | Status: DC | PRN
  Administered 2013-11-17 – 2013-11-18 (×3): 1 mg via INTRAVENOUS
  Filled 2013-11-17 (×3): qty 1

## 2013-11-17 MED ORDER — FUROSEMIDE 10 MG/ML IJ SOLN
40.0000 mg | Freq: Two times a day (BID) | INTRAMUSCULAR | Status: DC
  Filled 2013-11-17 (×2): qty 4

## 2013-11-17 MED ORDER — DEXTROSE 5 % IV SOLN
1.0000 g | INTRAVENOUS | Status: DC
  Filled 2013-11-17: qty 10

## 2013-11-17 MED ORDER — DILTIAZEM 12 MG/ML ORAL SUSPENSION
90.0000 mg | Freq: Four times a day (QID) | ORAL | Status: DC
  Administered 2013-11-18 (×3): 90 mg
  Filled 2013-11-17 (×10): qty 9

## 2013-11-17 MED ORDER — DEXTROSE 5 % IV SOLN: 1.0000 g | INTRAVENOUS | Status: DC

## 2013-11-17 MED ORDER — MORPHINE SULFATE 2 MG/ML IJ SOLN
INTRAMUSCULAR | Status: AC
  Administered 2013-11-17: 1 mg via INTRAVENOUS
  Filled 2013-11-17: qty 1

## 2013-11-17 NOTE — H&P (Addendum)
[facility] History and Physical  [name] SEG:[id number] DOB: 23-Aug-1920 DOA: 11/22/2013  Referring physician: EDP PCP: [name], MD   Chief Complaint: Shortness of breath  HPI: [name] is a 80 y.o. male with PMH of Chronic diastolic CHF, Afib, CAD, Pacemaker, CKD 3, h/o PE on Xarelto, urinary retention with foley now, severe dysphagia s/p PEG tube last admission, was discharged to SNF after a long hospitalization on 6/25: he was treated for aspiration pneumonia, Afib RVR, abd pain, Dysphagia after much back and forth finally had a PEG tube placed and discharged. Since discharge 2 weeks ago, family reports ongoing decline, respiratory and mentation. In the last few days has been gurgling and having some shortness of breath and due to worsening resp distress and hypoxia brought to the ER today. In ER, Hypoxic, tachypneic, obtunded and Labs reveal BNP elevated compared to baseline and CXR with vascular congestion and bilateral pleural effusions.   Review of Systems: unable to get RoS, due to obtunded status Constitutional:  No weight loss, night sweats, Fevers, chills, fatigue.  HEENT:  No headaches, Difficulty swallowing,Tooth/dental problems,Sore throat,  No sneezing, itching, ear ache, nasal congestion, post nasal drip,  Cardio-vascular:  No chest pain, Orthopnea, PND, swelling in lower extremities, anasarca, dizziness, palpitations  GI:  No heartburn, indigestion, abdominal pain, nausea, vomiting, diarrhea, change in bowel habits, loss of appetite  Resp:  No shortness of breath with exertion or at rest. No excess mucus, no productive cough, No non-productive cough, No coughing up of blood.No change in color of mucus.No wheezing.No chest wall deformity  Skin:  no rash or lesions.  GU:  no dysuria, change in color of urine, no urgency or frequency. No flank pain.  Musculoskeletal:  No joint pain or swelling. No decreased range of motion. No back pain.   Psych:  No change in mood or affect. No depression or anxiety. No memory loss.   Past Medical History  Diagnosis Date  . Presence of permanent cardiac pacemaker 04/2006 upgrade    a. s/p MDT Adapta ADDR01 dual chamber PPM, ser #: [id number] H.  [name] PAF (paroxysmal atrial fibrillation)     a. previously on coumadin-->patient self-d/c'd 11/2012 b/c he was tired of having INR's checked.  . Tachy-brady syndrome   . DM (diabetes mellitus)   . CAD (coronary artery disease)     DES to cx  . Pulmonary embolism 12/2003  . Deep vein thrombophlebitis of leg 2005, 2009    recurrent when off anticoag  . HTN (hypertension)   . Allergic rhinitis   . Asthma   . CKD (chronic kidney disease), stage III   . Nasal polyp    Past Surgical History  Procedure Laterality Date  . Cataract/lens implants    . Nasal polyp surgery    . Coronary angioplasty with stent placement  2008    DES to cx  . Pacemaker insertion  04/2006    Medtronic Adapta  . Cardioversion N/A 03/20/2013    Procedure: CARDIOVERSION;  Surgeon: Carlena Bjornstad, MD;  Location: [facility] ENDOSCOPY;  Service: Cardiovascular;  Laterality: N/A;   Social History:  reports that he has never smoked. He does not have any smokeless tobacco history on file. He reports that he does not drink alcohol or use illicit drugs.  Allergies  Allergen Reactions  . Asa Buff (Mag [Buffered Aspirin] Nausea Only  . Ibuprofen Nausea And Vomiting    Family History  Problem Relation Age of Onset  . Diabetes Neg Hx   .  Hypertension Neg Hx   . Coronary artery disease Neg Hx      Prior to Admission medications   Medication Sig Start Date End Date Taking? Authorizing Provider  Nutritional Supplements (FEEDING SUPPLEMENT, GLUCERNA 1.2 CAL,) LIQD by Gastrostomy Tube route continuous. 60cc/hr   Yes Historical Provider, MD  albuterol (PROVENTIL HFA;VENTOLIN HFA) 108 (90 BASE) MCG/ACT inhaler Inhale 2 puffs into the lungs every 4 (four) hours as needed for wheezing or  shortness of breath. 10/20/13   Shanker Kristeen Mans, MD  Blood Glucose Monitoring Suppl (ONE TOUCH ULTRA 2) W/DEVICE KIT Use to check blood sugars daily Dx 250.00 05/23/13   Rowe Clack, MD  budesonide (PULMICORT) 0.25 MG/2ML nebulizer solution Take 0.25 mg by nebulization every 6 (six) hours as needed (shortness of breath).     Historical Provider, MD  digoxin (LANOXIN) 0.05 MG/ML solution Place 2.5 mLs (0.125 mg total) into feeding tube daily. 11/06/13   Allie Bossier, MD  diltiazem (CARDIZEM) 10 mg/ml oral suspension Place 9 mLs (90 mg total) into feeding tube every 6 (six) hours. 11/06/13   Allie Bossier, MD  enoxaparin (LOVENOX) 80 MG/0.8ML injection Inject 0.8 mLs (80 mg total) into the skin daily. 11/06/13   Allie Bossier, MD  furosemide (LASIX) 40 MG tablet 40 mg by Gastrostomy Tube route daily.  08/25/13   Historical Provider, MD  glucose blood (ONE TOUCH ULTRA TEST) test strip Use to check blood sugars before breakfast Dx 250.00 05/23/13   Rowe Clack, MD  guaiFENesin (MUCINEX) 600 MG 12 hr tablet 600 mg by Gastrostomy Tube route 2 (two) times daily. 9am and 5pm    Historical Provider, MD  hydrALAZINE (APRESOLINE) 10 MG tablet Place 1 tablet (10 mg total) into feeding tube every 8 (eight) hours. 11/06/13   Allie Bossier, MD  HYDROcodone-acetaminophen (NORCO/VICODIN) 5-325 MG per tablet Take 1 tablet per tube every 6 hours as needed for mild pain; Take 2 tablets (10/650 mg) per tube every 6 hours as needed for moderate pain. 11/12/13   Blanchie Serve, MD  insulin aspart (NOVOLOG FLEXPEN) 100 UNIT/ML FlexPen Inject 3 Units into the skin 3 (three) times daily with meals. 11/06/13   Allie Bossier, MD  insulin glargine (LANTUS) 100 UNIT/ML injection Inject 10 Units into the skin at bedtime. And 10 units q day 11/06/13   Allie Bossier, MD  ipratropium (ATROVENT) 0.02 % nebulizer solution Take 0.5 mg by nebulization 4 (four) times daily - after meals and at bedtime. 02/11/13   Debbe Odea, MD    levalbuterol (XOPENEX) 0.63 MG/3ML nebulizer solution Take 3 mLs (0.63 mg total) by nebulization every 6 (six) hours as needed for wheezing or shortness of breath. 10/20/13   Shanker Kristeen Mans, MD  metoprolol tartrate (LOPRESSOR) 12.5 mg TABS tablet Place 0.5 tablets (12.5 mg total) into feeding tube 2 (two) times daily. 11/06/13   Allie Bossier, MD  Multiple Vitamins-Iron (QC DAILY MULTIVITAMINS/IRON) TABS 1 tablet by PEG Tube route daily. 11/06/13   Allie Bossier, MD   Physical Exam: Filed Vitals:   11/16/2013 1200  BP: 144/67  Pulse: 82  Temp:   Resp: 34    BP 144/67  Pulse 82  Temp(Src) 98.6 F (37 C) (Rectal)  Resp 34  Ht '5\' 9"'  (1.753 m)  Wt 77.111 kg (170 lb)  BMI 25.09 kg/m2  SpO2 99%  General:  Lethargic, grunting, arousible intermittently and answers a few questions Eyes: PERRL, injected conjunctiva, crusting and matted eyelashes  ENT: grossly normal hearing, lips & tongue Neck: no LAD, masses or thyromegaly Cardiovascular: Irregular rate and rhythm, no m/r/g. trace LE edema. Respiratory: diminished Bs at bases, gurgling, conducted upper airway sounds. Mildly tachypneic Abdomen: soft, Distended, Mild tenderness diffusely in lower quadrants Sacrum: with decubitus ulcer unable to turn him over to examine this Skin: no rash or induration seen on limited exam Musculoskeletal: pressure ulcers on heels Psychiatric: obtunded unable to assess Neurologic: obtunded.          Labs on Admission:  Basic Metabolic Panel:  Recent Labs Lab 11/25/2013 1025  NA 134*  K 4.8  CL 91*  CO2 30  GLUCOSE 354*  BUN 43*  CREATININE 1.34  CALCIUM 8.5   Liver Function Tests:  Recent Labs Lab 11/16/2013 1025  AST 29  ALT 15  ALKPHOS 140*  BILITOT 0.4  PROT 5.5*  ALBUMIN 2.1*   No results found for this basename: LIPASE, AMYLASE,  in the last 168 hours No results found for this basename: AMMONIA,  in the last 168 hours CBC:  Recent Labs Lab 11/15/2013 1025  WBC 15.0*   NEUTROABS 12.1*  HGB 10.8*  HCT 34.3*  MCV 97.4  PLT 271   Cardiac Enzymes: No results found for this basename: CKTOTAL, CKMB, CKMBINDEX, TROPONINI,  in the last 168 hours  BNP (last 3 results)  Recent Labs  10/19/13 0915 10/31/13 0500 12/12/2013 1025  PROBNP 527.0* 4249.0* 5914.0*   CBG: No results found for this basename: GLUCAP,  in the last 168 hours  Radiological Exams on Admission: Dg Chest Port 1 View  12/01/2013   CLINICAL DATA:  Shortness of breath.  EXAM: PORTABLE CHEST - 1 VIEW  COMPARISON:  10/31/2013.  FINDINGS: The heart is enlarged but stable. There is tortuosity and calcification of the thoracic aorta. There are bilateral pleural effusions with overlying atelectasis. Mild vascular congestion without overt pulmonary edema. The pacer wires are stable.  IMPRESSION: Stable cardiac enlargement, pleural effusions and bibasilar atelectasis.   Electronically Signed   By: Kalman Jewels M.D.   On: 11/24/2013 11:00    EKG: Independently reviewed. Afib with HR of 106, RBBB  Assessment/Plan    1. Acute and chronic respiratory failure -due to acute on chronic diastolic CHF -Diurese with IV lasix -Had a long conversation with 2 sons and spouse, regarding numerous med problems and ongoing decline, they understand that he may pass on very soon -DNR, NO BIPAP -IF Declines further focus on comfort, if stabilizes home with Hospice -Palliative consult for Goals of care  2. Encephalopathy - multifactorial, due to resp failure, was also getting narcotics at SNF for back pain - ABG without Acute resp acidosis  3. Adult failure to thrive with CHF/Afib, Severe Dysphagia/ongoing aspiration, CKD, cognitive decline, decubitus ulcers and Advanced age -Family wants home with hospice if survives, if declines focus on comfort -Palliative consult requested  4. Abd distension, mild tenderness, diminished BS -given overall condition, no plans for further imaging/workup, d/w family in  agreement -CT 6/27 without acute findings -Hold tube feeds for now -if stabilizes, resume this at low rate  5.   Chronic kidney disease, stage 3 -creatinine improved from baseline since volume overloaded -expect this to rise with diuresis  6.  Atrial fibrillation with rapid ventricular response - resume Cardizem and metoprolol via PEG at same dose he was discharged on - continue digoxin, check level  7.  COPD /chronic bronchitis  8.  Tachy-brady syndrome -s/p pacer  9.  Dysphagia/s/p PEG  last week -hold tube feeds for now, see discussion above  10.  DM  type 2  -hold lantus, SSI   11. H/o Pulmonary  Embolism -xarelto was changed to Lovenox 69m at SNF, will keep on this for now  12. UTI vs Pyuria due to colonization -empiric rocephin for now -FU Urine Cx  DVT proph: xarelto  Code Status: DNR Family Communication: d/w 2 sons and spouse at bedside Disposition Plan: Home with hospice if he survives hospitalization  Time spent: 653m  Skylor Schnapp [facility] Pager [phone]**Disclaimer: This note may have been dictated with voice recognition software. Similar sounding words can inadvertently be transcribed and this note may contain transcription errors which may not have been corrected upon publication of note.**

## 2013-11-17 NOTE — ED Provider Notes (Signed)
CSN: [id number]     Arrival date & time 11/29/2013  6226 History   First MD Initiated Contact with Patient 12/12/2013 1003     No chief complaint on file.    (Consider location/radiation/quality/duration/timing/severity/associated sxs/prior Treatment) HPI Comments: Patient with significant history of recent hospitalization diagnosed with aspiration pneumonia, UTI, cholelithiasis without cholecystitis, chronic kidney disease and feeds through G-tube is currently on antibiotic therapy for aspiration pneumonia per nursing home report in the last 4 hours has had worsening shortness of breath not improved after 3 nebulized breathing treatments. When EMS arrived patient was sating 86% on 3 L nasal cannula. Patient normally does not require nasal cannula oxygen. Patient has had worsening cough, sputum production, vomiting or abdominal pain. And feeds are running when EMS arrived without difficulty and no report of issues with feeds.  Patient is a 80 y.o. male presenting with shortness of breath. The history is provided by the nursing home and the EMS personnel. The history is limited by the condition of the patient and the absence of a caregiver.  Shortness of Breath Severity:  Severe Onset quality:  Sudden Duration:  4 hours Timing:  Constant Progression:  Unchanged Chronicity:  Recurrent Context comment:  Pt with recent dx of aspiration PNA and UTI completing abx treatment.   Relieved by:  Nothing Worsened by:  Nothing tried Ineffective treatments:  Inhaler Associated symptoms: cough, fever and wheezing     Past Medical History  Diagnosis Date  . Presence of permanent cardiac pacemaker 04/2006 upgrade    a. s/p MDT Adapta ADDR01 dual chamber PPM, ser #: [id number] H.  [name] PAF (paroxysmal atrial fibrillation)     a. previously on coumadin-->patient self-d/c'd 11/2012 b/c he was tired of having INR's checked.  . Tachy-brady syndrome   . DM (diabetes mellitus)   . CAD (coronary artery disease)    DES to cx  . Pulmonary embolism 12/2003  . Deep vein thrombophlebitis of leg 2005, 2009    recurrent when off anticoag  . HTN (hypertension)   . Allergic rhinitis   . Asthma   . CKD (chronic kidney disease), stage III   . Nasal polyp    Past Surgical History  Procedure Laterality Date  . Cataract/lens implants    . Nasal polyp surgery    . Coronary angioplasty with stent placement  2008    DES to cx  . Pacemaker insertion  04/2006    [employer] Adapta  . Cardioversion N/A 03/20/2013    Procedure: CARDIOVERSION;  Surgeon: Carlena Bjornstad, MD;  Location: [facility] ENDOSCOPY;  Service: Cardiovascular;  Laterality: N/A;   Family History  Problem Relation Age of Onset  . Diabetes Neg Hx   . Hypertension Neg Hx   . Coronary artery disease Neg Hx    History  Substance Use Topics  . Smoking status: Never Smoker   . Smokeless tobacco: Not on file     Comment: married since 1952 to wife Dub Mikes, retrired Cabin crew  . Alcohol Use: No    Review of Systems  Unable to perform ROS Constitutional: Positive for fever.  Respiratory: Positive for cough, shortness of breath and wheezing.       Allergies  Asa buff (mag and Ibuprofen  Home Medications   Prior to Admission medications   Medication Sig Start Date End Date Taking? Authorizing Provider  albuterol (PROVENTIL HFA;VENTOLIN HFA) 108 (90 BASE) MCG/ACT inhaler Inhale 2 puffs into the lungs every 4 (four) hours as needed for wheezing or shortness of breath.  10/20/13   Shanker Kristeen Mans, MD  Blood Glucose Monitoring Suppl (ONE TOUCH ULTRA 2) W/DEVICE KIT Use to check blood sugars daily Dx 250.00 05/23/13   Rowe Clack, MD  budesonide (PULMICORT) 0.25 MG/2ML nebulizer solution Take 0.25 mg by nebulization every 6 (six) hours as needed (shortness of breath).     Historical Provider, MD  digoxin (LANOXIN) 0.05 MG/ML solution Place 2.5 mLs (0.125 mg total) into feeding tube daily. 11/06/13   Allie Bossier, MD  diltiazem (CARDIZEM) 10  mg/ml oral suspension Place 9 mLs (90 mg total) into feeding tube every 6 (six) hours. 11/06/13   Allie Bossier, MD  enoxaparin (LOVENOX) 80 MG/0.8ML injection Inject 0.8 mLs (80 mg total) into the skin daily. 11/06/13   Allie Bossier, MD  furosemide (LASIX) 40 MG tablet 40 mg by Gastrostomy Tube route daily.  08/25/13   Historical Provider, MD  glucose blood (ONE TOUCH ULTRA TEST) test strip Use to check blood sugars before breakfast Dx 250.00 05/23/13   Rowe Clack, MD  guaiFENesin (MUCINEX) 600 MG 12 hr tablet 600 mg by Gastrostomy Tube route 2 (two) times daily. 9am and 5pm    Historical Provider, MD  hydrALAZINE (APRESOLINE) 10 MG tablet Place 1 tablet (10 mg total) into feeding tube every 8 (eight) hours. 11/06/13   Allie Bossier, MD  HYDROcodone-acetaminophen (NORCO/VICODIN) 5-325 MG per tablet Take 1 tablet per tube every 6 hours as needed for mild pain; Take 2 tablets (10/650 mg) per tube every 6 hours as needed for moderate pain. 11/12/13   Blanchie Serve, MD  insulin aspart (NOVOLOG FLEXPEN) 100 UNIT/ML FlexPen Inject 3 Units into the skin 3 (three) times daily with meals. 11/06/13   Allie Bossier, MD  insulin glargine (LANTUS) 100 UNIT/ML injection Inject 10 Units into the skin at bedtime. And 10 units q day 11/06/13   Allie Bossier, MD  ipratropium (ATROVENT) 0.02 % nebulizer solution Take 0.5 mg by nebulization 4 (four) times daily - after meals and at bedtime. 02/11/13   Debbe Odea, MD  levalbuterol (XOPENEX) 0.63 MG/3ML nebulizer solution Take 3 mLs (0.63 mg total) by nebulization every 6 (six) hours as needed for wheezing or shortness of breath. 10/20/13   Shanker Kristeen Mans, MD  metoprolol tartrate (LOPRESSOR) 12.5 mg TABS tablet Place 0.5 tablets (12.5 mg total) into feeding tube 2 (two) times daily. 11/06/13   Allie Bossier, MD  Multiple Vitamins-Iron (QC DAILY MULTIVITAMINS/IRON) TABS 1 tablet by PEG Tube route daily. 11/06/13   Allie Bossier, MD  Nutritional Supplements (FEEDING  SUPPLEMENT, GLUCERNA 1.2 CAL,) LIQD by Gastrostomy Tube route continuous. 60cc/hr    Historical Provider, MD   There were no vitals taken for this visit. Physical Exam  Nursing note and vitals reviewed. Constitutional: He appears well-developed and well-nourished.  Minimally responsive.  Will open eyes to voice briefly and will occasionally answer a yes/no question  HENT:  Head: Normocephalic and atraumatic.  Mouth/Throat: Oropharynx is clear and moist. Mucous membranes are dry.  Eyes: EOM are normal. Pupils are equal, round, and reactive to light.  Clear Drainage with injected conjunctiva bilaterally  Neck: Normal range of motion. Neck supple.  Cardiovascular: Intact distal pulses.  An irregularly irregular rhythm present. Tachycardia present.   No murmur heard. Pulmonary/Chest: Tachypnea noted. No respiratory distress. He has no wheezes. He has rhonchi. He has rales.  Abdominal: Soft. He exhibits distension. There is no tenderness. There is no rebound and no guarding.  g-tube in place without drainage or redness.  No notable abd tenderness  Genitourinary:  Foley in place with yellow urine draining in bag  Musculoskeletal: Normal range of motion. He exhibits no edema and no tenderness.  Skin: Skin is warm and dry. No rash noted. No erythema.  Psychiatric: He has a normal mood and affect. His behavior is normal.    ED Course  Procedures (including critical care time) Labs Review Labs Reviewed  CBC WITH DIFFERENTIAL - Abnormal; Notable for the following:    WBC 15.0 (*)    RBC 3.52 (*)    Hemoglobin 10.8 (*)    HCT 34.3 (*)    RDW 15.9 (*)    Neutrophils Relative % 81 (*)    Neutro Abs 12.1 (*)    Lymphocytes Relative 8 (*)    Monocytes Absolute 1.3 (*)    All other components within normal limits  COMPREHENSIVE METABOLIC PANEL - Abnormal; Notable for the following:    Sodium 134 (*)    Chloride 91 (*)    Glucose, Bld 354 (*)    BUN 43 (*)    Total Protein 5.5 (*)     Albumin 2.1 (*)    Alkaline Phosphatase 140 (*)    GFR calc non Af Amer 45 (*)    GFR calc Af Amer 52 (*)    All other components within normal limits  URINALYSIS, ROUTINE W REFLEX MICROSCOPIC - Abnormal; Notable for the following:    APPearance CLOUDY (*)    Hgb urine dipstick LARGE (*)    Protein, ur 30 (*)    Leukocytes, UA LARGE (*)    All other components within normal limits  PRO B NATRIURETIC PEPTIDE - Abnormal; Notable for the following:    Pro B Natriuretic peptide (BNP) 5914.0 (*)    All other components within normal limits  URINE MICROSCOPIC-ADD ON - Abnormal; Notable for the following:    Squamous Epithelial / LPF FEW (*)    Bacteria, UA MANY (*)    All other components within normal limits  I-STAT ARTERIAL BLOOD GAS, ED - Abnormal; Notable for the following:    pCO2 arterial 50.2 (*)    Bicarbonate 33.5 (*)    Acid-Base Excess 8.0 (*)    All other components within normal limits  I-STAT TROPOININ, ED  I-STAT CG4 LACTIC ACID, ED    Imaging Review Dg Chest Port 1 View  11/26/2013   CLINICAL DATA:  Shortness of breath.  EXAM: PORTABLE CHEST - 1 VIEW  COMPARISON:  10/31/2013.  FINDINGS: The heart is enlarged but stable. There is tortuosity and calcification of the thoracic aorta. There are bilateral pleural effusions with overlying atelectasis. Mild vascular congestion without overt pulmonary edema. The pacer wires are stable.  IMPRESSION: Stable cardiac enlargement, pleural effusions and bibasilar atelectasis.   Electronically Signed   By: Kalman Jewels M.D.   On: 11/16/2013 11:00     EKG Interpretation   Date/Time:  Monday November 17 2013 10:15:09 EDT Ventricular Rate:  106 PR Interval:    QRS Duration: 127 QT Interval:  358 QTC Calculation: 475 R Axis:   -65 Text Interpretation:  Atrial fibrillation RBBB and LAFB Nonspecific T  abnormalities, lateral leads Minimal ST elevation, lateral leads No  significant change since last tracing Confirmed by Maryan Rued  MD,  Loree Fee  [phone]) on 11/27/2013 10:37:26 AM      MDM   Final diagnoses:  Acute congestive heart failure, unspecified congestive heart failure type  UTI (lower  urinary tract infection)  Hypoxia    Pt currently at [place] who was recently treated for aspiration PNA, UTI and evaluated to r/o cholecystitis who has been on lovenox for hx of dvt/PE who present today with 4 hours of worsening SOB.  Pt was sating 86% on 3L [place] when EMS arrived.  Pt normally does not require O2 and has been getting all nutrition through g-tube due to aspiration of thick and thin liquids.   At nursing facility today pt's sx did not improve after 3 breathing treatments and he was sent here.  Here pt will arouse to voice and answer short yes or no questions.  Hot to touch with rhonchi and rales in all lung fields.  No appreciable abd pain on exam and foley in place with yellow urine draining.  Concern for worsening infection vs hypercarbia vs COPD exacerbation vs CHF as cause of pt's hypoxia and SOB.  CBC, CMP, UA, troponin, lactate, ABG, BNP, chest x-ray and EKG pending.  We'll continue nonrebreather at this time  11:15 AM Labs consistent with CHF with fluid overload on CXR.  Mild elevation of WBC to 15,000 and UA with persistent infection.  Cr stable and lactate/trop wnl.  Will treat with lasix.  Will admit for further care.  Blanchie Dessert, MD 12/03/2013 1220

## 2013-11-17 NOTE — ED Notes (Signed)
RT at bedside.

## 2013-11-17 NOTE — ED Notes (Signed)
Admit Doctor at bedside.  

## 2013-11-17 NOTE — ED Notes (Addendum)
EMS Vital Signs - BP 144/90, HR 90, and RR 22 86% [place] 3L EMS placed patient on NRB 15L.

## 2013-11-17 NOTE — ED Notes (Signed)
EDP at bedside  

## 2013-11-17 NOTE — ED Notes (Signed)
Onset one week ago shortness of breath worsening today given multiple breathing treatments with no relief. Sent to ED for evaluation.

## 2013-11-18 DIAGNOSIS — E1129 Type 2 diabetes mellitus with other diabetic kidney complication: Secondary | ICD-10-CM

## 2013-11-18 DIAGNOSIS — R1310 Dysphagia, unspecified: Secondary | ICD-10-CM

## 2013-11-18 DIAGNOSIS — I509 Heart failure, unspecified: Secondary | ICD-10-CM

## 2013-11-18 DIAGNOSIS — G9340 Encephalopathy, unspecified: Secondary | ICD-10-CM

## 2013-11-18 DIAGNOSIS — Z66 Do not resuscitate: Secondary | ICD-10-CM | POA: Diagnosis present

## 2013-11-18 DIAGNOSIS — N189 Chronic kidney disease, unspecified: Secondary | ICD-10-CM

## 2013-11-18 LAB — CBC
HCT: 33.6 % — ABNORMAL LOW (ref 39.0–52.0)
Hemoglobin: 10.3 g/dL — ABNORMAL LOW (ref 13.0–17.0)
MCH: 30.2 pg (ref 26.0–34.0)
MCHC: 30.7 g/dL (ref 30.0–36.0)
MCV: 98.5 fL (ref 78.0–100.0)
PLATELETS: 284 10*3/uL (ref 150–400)
RBC: 3.41 MIL/uL — ABNORMAL LOW (ref 4.22–5.81)
RDW: 15.9 % — AB (ref 11.5–15.5)
WBC: 11.8 10*3/uL — AB (ref 4.0–10.5)

## 2013-11-18 LAB — PROTIME-INR
INR: 1.07 (ref 0.00–1.49)
PROTHROMBIN TIME: 13.9 s (ref 11.6–15.2)

## 2013-11-18 LAB — COMPREHENSIVE METABOLIC PANEL
ALBUMIN: 2 g/dL — AB (ref 3.5–5.2)
ALT: 19 U/L (ref 0–53)
AST: 35 U/L (ref 0–37)
Alkaline Phosphatase: 135 U/L — ABNORMAL HIGH (ref 39–117)
Anion gap: 15 (ref 5–15)
BUN: 45 mg/dL — ABNORMAL HIGH (ref 6–23)
CALCIUM: 8.2 mg/dL — AB (ref 8.4–10.5)
CO2: 30 mEq/L (ref 19–32)
Chloride: 93 mEq/L — ABNORMAL LOW (ref 96–112)
Creatinine, Ser: 1.45 mg/dL — ABNORMAL HIGH (ref 0.50–1.35)
GFR calc non Af Amer: 41 mL/min — ABNORMAL LOW (ref >90)
GFR, EST AFRICAN AMERICAN: 47 mL/min — AB (ref >90)
Glucose, Bld: 309 mg/dL — ABNORMAL HIGH (ref 70–99)
Potassium: 4.3 mEq/L (ref 3.7–5.3)
SODIUM: 138 meq/L (ref 137–147)
TOTAL PROTEIN: 5.4 g/dL — AB (ref 6.0–8.3)
Total Bilirubin: 0.5 mg/dL (ref 0.3–1.2)

## 2013-11-18 LAB — CLOSTRIDIUM DIFFICILE BY PCR: CDIFFPCR: NEGATIVE

## 2013-11-18 MED ORDER — MORPHINE SULFATE (CONCENTRATE) 10 MG /0.5 ML PO SOLN
10.0000 mg | ORAL | Status: DC | PRN
  Administered 2013-11-18: 20 mg via SUBLINGUAL
  Filled 2013-11-18: qty 1

## 2013-11-18 MED ORDER — MORPHINE SULFATE (CONCENTRATE) 10 MG /0.5 ML PO SOLN
10.0000 mg | ORAL | Status: DC
  Administered 2013-11-18 (×2): 10 mg via ORAL
  Filled 2013-11-18 (×2): qty 0.5

## 2013-11-18 MED ORDER — MORPHINE SULFATE (CONCENTRATE) 10 MG /0.5 ML PO SOLN
10.0000 mg | ORAL | Status: DC | PRN
  Administered 2013-11-18 (×2): 10 mg via SUBLINGUAL
  Filled 2013-11-18 (×2): qty 0.5

## 2013-11-18 MED ORDER — DIAZEPAM 1 MG/ML PO SOLN: 2.5000 mg | Freq: Four times a day (QID) | ORAL | Status: DC | PRN

## 2013-11-18 MED ORDER — SCOPOLAMINE 1 MG/3DAYS TD PT72
1.0000 | MEDICATED_PATCH | TRANSDERMAL | Status: DC
  Administered 2013-11-18: 1.5 mg via TRANSDERMAL
  Filled 2013-11-18: qty 1

## 2013-11-18 MED ORDER — ATROPINE SULFATE 1 % OP SOLN
2.0000 [drp] | OPHTHALMIC | Status: DC | PRN
Start: 2013-11-18 — End: 2013-11-19
  Filled 2013-11-18: qty 2

## 2013-11-18 MED ORDER — DIAZEPAM 5 MG/ML PO CONC: 2.5000 mg | Freq: Four times a day (QID) | ORAL | Status: DC | PRN

## 2013-11-19 LAB — URINE CULTURE: Colony Count: >=100000

## 2013-11-19 NOTE — Progress Notes (Signed)
Family was given time with pt. And then body was cleaned, rectal tube/coude' cath removed.  Post-mortem care performed, bed control notified to call funeral home and awaiting call back for ETA.

## 2013-11-24 NOTE — Discharge Summary (Signed)
Death Summary  MATIN MATTIOLI SRP:[id number] DOB: 1921-03-09 DOA: 2013/11/22  PCP: [name], MD   Admit date: November 22, 2013 Date of Death: Nov 29, 2013  Final Diagnoses:  Active Problems:   DM (diabetes mellitus), type 2 with renal complications   Embolism, pulmonary with infarction, hx of   Acute and chronic respiratory failure   Chronic diastolic heart failure   Chronic renal disease, stage 3, moderately decreased glomerular filtration rate between 30-59 mL/min/1.73 square meter   Acute respiratory failure with hypoxia   Atrial fibrillation with rapid ventricular response   COPD (chronic obstructive pulmonary disease)   Tachy-brady syndrome   Dysphagia   DNR (do not resuscitate)    1. Code Status: DNR 2. Scope of Treatment:  Full Comfort  Terminal Care  Discontinue all non-essential meds 3. Symptom Management: Roxanol 10-20mg  q1 PRN for pain and dyspnea  Valium SL 5mg  q4prn  Nebs prn  Scop patch 4. Palliative Prophylaxis: orders placed 5. Disposition: Anticipate hospital death   Time: 65  Signed:  Eulogio Bear  [facility] 11-29-13, 1:38 PM

## 2013-12-13 NOTE — Progress Notes (Signed)
Nutrition Brief Note  Patient identified on the Malnutrition Screening Tool Report. Patient with PEG tube; tube feedings on hold. Family wanting Hospice; Palliative Care Team consulted for goals of care. Please consult RD as needed.   Arthur Holms, RD, LDN Pager #: [phone] After-Hours Pager #: [phone]

## 2013-12-13 NOTE — Progress Notes (Signed)
Pt. Witnessed taking last few breaths until pulse and resp. Ceased.  Family member present as well.  Pronounced at 08-31-30 by 2 RN's Maxwell Caul, RN and Len Blalock, RN.  Baltazar Najjar notified via page./Call was returned by provider and will come see pt. To fill out death certificate.  Donor services notified and pt. Not suitable for donation and will be released to [facility].  Awaiting family to arrive to visit pt.

## 2013-12-13 NOTE — Progress Notes (Signed)
RN paged NP secondary to pt expiring at 2132 hours 2013/12/13. Pt was DNR and hospital death was expected. Comfort was care focus. Death certificate completed.  Clance Boll, NP

## 2013-12-13 NOTE — Consult Note (Signed)
Palliative Medicine Team at [facility]  Date: 12/08/13   Patient Name: [name]  DOB: Dec 27, 1920  MRN: [id number]  Age / Sex: 80 y.o., male   PCP: Rowe Clack, MD Referring Physician: Geradine Girt, DO  Active Problems: Active Problems:   DM (diabetes mellitus), type 2 with renal complications   Embolism, pulmonary with infarction, hx of   Acute and chronic respiratory failure   Chronic diastolic heart failure   Chronic renal disease, stage 3, moderately decreased glomerular filtration rate between 30-59 mL/min/1.73 square meter   Acute respiratory failure with hypoxia   Atrial fibrillation with rapid ventricular response   COPD (chronic obstructive pulmonary disease)   Tachy-brady syndrome   Dysphagia   HPI/Reason for Consultation: [name] is a 80 y.o. male end stage CHF, actively dying.  Participants in Discussion: HCPOA: yes Two sons at bedside   Advance Directive: none   Code Status Orders        Start     Ordered   11/27/2013 1523  Do not attempt resuscitation (DNR)   Continuous    Question Answer Comment  Maintain current active treatments Yes   Do not initiate new interventions Yes      11/12/2013 1522      I have reviewed the medical record, interviewed the patient and family, and examined the patient. The following aspects are pertinent.  Past Medical History  Diagnosis Date  . PAF (paroxysmal atrial fibrillation)     a. previously on coumadin-->patient self-d/c'd 11/2012 b/c he was tired of having INR's checked.  . Tachy-brady syndrome   . CAD (coronary artery disease)     DES to cx  . Pulmonary embolism 12/2003  . Deep vein thrombophlebitis of leg 2005, 2009    recurrent when off anticoag  . HTN (hypertension)   . Allergic rhinitis   . Asthma   . CKD (chronic kidney disease), stage III   . Nasal polyp   . Pacemaker   . High cholesterol   . CHF (congestive heart failure)   . COPD (chronic obstructive pulmonary disease)     . Pneumonia     "several times"  . Chronic bronchitis   . Type II diabetes mellitus   . Basal cell carcinoma of head   . BPH (benign prostatic hypertrophy)    History   Social History  . Marital Status: Married    Spouse Name: N/A    Number of Children: N/A  . Years of Education: 12   Occupational History  . Former Insurance underwriter   .     Social History Main Topics  . Smoking status: Never Smoker   . 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 Alcohol Use: Yes     Comment: 11/22/2013 "drank beer on the weekends; long time ago"  . Drug Use: No  . Sexual Activity: None   Other Topics Concern  . None   Social History Narrative  . None   Family History  Problem Relation Age of Onset  . Diabetes Neg Hx   . Hypertension Neg Hx   . Coronary artery disease Neg Hx    Scheduled Meds: . scopolamine  1 patch Transdermal Q72H   Continuous Infusions:  PRN Meds:.acetaminophen, albuterol, atropine, diazepam, ipratropium, morphine CONCENTRATE, ondansetron (ZOFRAN) IV Allergies  Allergen Reactions  . Asa Buff (Mag [Buffered Aspirin] Nausea Only  . Ibuprofen Nausea And Vomiting   CBC:  Component Value Date/Time   WBC 11.8* 2013-11-21 0528   HGB 10.3* 11/21/13 0528   HCT 33.6* Nov 21, 2013 0528   PLT 284 Nov 21, 2013 0528   MCV 98.5 November 21, 2013 0528   NEUTROABS 12.1* 11/20/2013 1025   LYMPHSABS 1.3 11/29/2013 1025   MONOABS 1.3* 11/29/2013 1025   EOSABS 0.4 11/30/2013 1025   BASOSABS 0.0 11/30/2013 1025   Comprehensive Metabolic Panel:    Component Value Date/Time   NA 138 2013/11/21 0528   K 4.3 Nov 21, 2013 0528   CL 93* 11-21-13 0528   CO2 30 2013/11/21 0528   BUN 45* 21-Nov-2013 0528   CREATININE 1.45* 11/21/13 0528   GLUCOSE 309* 2013/11/21 0528   CALCIUM 8.2* 11/21/2013 0528   AST 35 Nov 21, 2013 0528   ALT 19 21-Nov-2013 0528   ALKPHOS 135* 11-21-2013 0528   BILITOT 0.5 11-21-13 0528   PROT 5.4* 11/21/13 0528   ALBUMIN 2.0* 11/21/13 0528     Vital Signs: BP 146/73  Pulse 107  Temp(Src) 97.7 F (36.5 C) (Axillary)  Resp 29  Ht 5\' 9"  (1.753 m)  Wt 77.111 kg (170 lb)  BMI 25.09 kg/m2  SpO2 94% Filed Weights   11/26/2013 1007  Weight: 77.111 kg (170 lb)   07/06 0701 - 07/07 0700 In: -  Out: 800 [Urine:800]  Physical Exam:  General Appearance: Moderate distress, Dyspnea HEENT: oropharynx is coated in thick green dried mucous, mouth is dry and mucosa Lungs: tachypnea CV: irregular rhythm Abdomen:slightly distended Extremities:+edema Skin:Skin color, texture, turgor normal. No rashes or lesions or multiple echymoses Psych: disoriented  Neuro: impaired due to medical illness  Summary of Established Goals of Care and Medical Treatment Preferences  Primary Diagnoses  1. CHF, chronic respiratory failure  Active Symptoms: 1. Dyspnea 2. Pain 3. Agitation  Psycho-social/Spiritual: Coping well, patient is veteran, retired Dealer.  Prognosis: hours   Palliative Performance Scale: 10  Recommendations:  1. Code Status: DNR 2. Scope of Treatment:   Full Comfort  Terminal Care  Discontinue all non-essential meds 3. Symptom Management:  Roxanol 10-20mg  q1 PRN for pain and dyspnea  Valium SL 5mg  q4prn  Nebs prn  Scop patch 4. Palliative Prophylaxis: orders placed 5. Disposition: Anticipate hospital death   Time In: 2:15 Time Out: 3PM Time Total: 45 minutes Greater than 50%  of this time was spent counseling and coordinating care related to the above assessment and plan.   Acquanetta Chain, DO  Nov 21, 2013, 3:03 PM  Please contact Palliative Medicine Team phone at [phone] for questions and concerns.

## 2013-12-13 NOTE — Care Management Note (Unsigned)
    Page 1 of 1   11-30-13     3:26:50 PM CARE MANAGEMENT NOTE 2013-11-30  Patient:  ULRICK, METHOT   Account Number:  [id number]  Date Initiated:  Nov 30, 2013  Documentation initiated by:  Amaris Delafuente  Subjective/Objective Assessment:   Pt adm on 11/16/2013 with respiratory failure, CHF.  PTA, pt resided at [facility].     Action/Plan:   CSW standing by; palliative care following, as pt is actively dying.  Provide emotional support to family members, who desire comfort measures only.   Anticipated DC Date:  11/19/2013   Anticipated DC Plan:    In-house referral  Clinical Social Worker  [place]  CM consult      Choice offered to / List presented to:             Status of service:  In process, will continue to follow Medicare Important Message given?   (If response is "NO", the following Medicare IM given date fields will be blank) Date Medicare IM given:   Medicare IM given by:   Date Additional Medicare IM given:   Additional Medicare IM given by:    Discharge Disposition:    Per UR Regulation:  Reviewed for med. necessity/level of care/duration of stay  If discussed at [place] of Stay Meetings, dates discussed:    Comments:

## 2013-12-13 NOTE — Progress Notes (Signed)
PROGRESS NOTE  [name] IFB:[id number] DOB: 29-Jun-1920 DOA: 12/05/2013 PCP: [name], MD  Assessment/Plan: Acute and chronic respiratory failure  -due to acute on chronic diastolic CHF  -Diurese with IV lasix  -admitting physician had a long conversation with 2 sons and spouse, regarding numerous med problems and ongoing decline, they understand that he may pass on very soon  -DNR, NO BIPAP  -family now wanting Hospice  -Palliative consult for Goals of care   Encephalopathy  - multifactorial, due to resp failure, was also getting narcotics at SNF for back pain  - ABG without Acute resp acidosis   Adult failure to thrive with CHF/Afib, Severe Dysphagia/ongoing aspiration, CKD, cognitive decline, decubitus ulcers and Advanced age  -Family wants Hospice -Palliative consult requested   Abd distension, mild tenderness, diminished BS  -given overall condition, no plans for further imaging/workup, d/w family in agreement  -CT 6/27 without acute findings  -Hold tube feeds for now    Chronic kidney disease, stage 3  -creatinine improved from baseline since volume overloaded  -expect this to rise with diuresis   Atrial fibrillation with rapid ventricular response  - resume Cardizem and metoprolol via PEG at same dose he was discharged on  - continue digoxin   COPD /chronic bronchitis   Tachy-brady syndrome  -s/p pacer   Dysphagia/s/p PEG last week  -hold tube feeds for now, see discussion above   DM type 2  -hold lantus, SSI    H/o Pulmonary Embolism  -xarelto was changed to Lovenox 80mg  at SNF, will keep on this for now until plans finalized  UTI vs Pyuria due to colonization  -empiric rocephin for now  -FU Urine Cx  Code Status: DNR Family Communication: son at bedside- who wants hospice placement Disposition Plan: inpt hospice   Consultants:  palliative care  Procedures:     HPI/Subjective: Moaning but not talking or communicating per  family at bedside  Objective: Filed Vitals:   01-Dec-2013 0500  BP: 123/73  Pulse: 105  Temp: 99.6 F (37.6 C)  Resp: 28    Intake/Output Summary (Last 24 hours) at 12/01/2013 0915 Last data filed at 11/13/2013 2120  Gross per 24 hour  Intake      0 ml  Output    800 ml  Net   -800 ml   Filed Weights   11/16/2013 1007  Weight: 77.111 kg (170 lb)    Exam:   General:  On non-rebreather  Cardiovascular: rrr  Respiratory: coarse breath sounds  Abdomen: +Bs  Musculoskeletal:  No edema  Data Reviewed: Basic Metabolic Panel:  Recent Labs Lab 11/26/2013 1025 12-01-13 0528  NA 134* 138  K 4.8 4.3  CL 91* 93*  CO2 30 30  GLUCOSE 354* 309*  BUN 43* 45*  CREATININE 1.34 1.45*  CALCIUM 8.5 8.2*   Liver Function Tests:  Recent Labs Lab 11/21/2013 1025 12-01-2013 0528  AST 29 35  ALT 15 19  ALKPHOS 140* 135*  BILITOT 0.4 0.5  PROT 5.5* 5.4*  ALBUMIN 2.1* 2.0*   No results found for this basename: LIPASE, AMYLASE,  in the last 168 hours No results found for this basename: AMMONIA,  in the last 168 hours CBC:  Recent Labs Lab 12/11/2013 1025 2013/12/01 0528  WBC 15.0* 11.8*  NEUTROABS 12.1*  --   HGB 10.8* 10.3*  HCT 34.3* 33.6*  MCV 97.4 98.5  PLT 271 284   Cardiac Enzymes: No results found for this basename: CKTOTAL, CKMB, CKMBINDEX, TROPONINI,  in the last 168 hours BNP (last 3 results)  Recent Labs  10/19/13 0915 10/31/13 0500 11/16/2013 1025  PROBNP 527.0* 4249.0* 5914.0*   CBG: No results found for this basename: GLUCAP,  in the last 168 hours  Recent Results (from the past 240 hour(s))  MRSA PCR SCREENING     Status: None   Collection Time    12/04/2013  8:07 PM      Result Value Ref Range Status   MRSA by PCR NEGATIVE  NEGATIVE Final   Comment:            The GeneXpert MRSA Assay (FDA     approved for NASAL specimens     only), is one component of a     comprehensive MRSA colonization     surveillance program. It is not     intended to  diagnose MRSA     infection nor to guide or     monitor treatment for     MRSA infections.     Studies: Dg Chest Port 1 View  11/16/2013   CLINICAL DATA:  Shortness of breath.  EXAM: PORTABLE CHEST - 1 VIEW  COMPARISON:  10/31/2013.  FINDINGS: The heart is enlarged but stable. There is tortuosity and calcification of the thoracic aorta. There are bilateral pleural effusions with overlying atelectasis. Mild vascular congestion without overt pulmonary edema. The pacer wires are stable.  IMPRESSION: Stable cardiac enlargement, pleural effusions and bibasilar atelectasis.   Electronically Signed   By: Kalman Jewels M.D.   On: 11/21/2013 11:00    Scheduled Meds: . cefTRIAXone (ROCEPHIN)  IV  1 g Intravenous Q24H  . digoxin  0.125 mg Per Tube Daily  . diltiazem  90 mg Per Tube 4 times per day  . enoxaparin  80 mg Subcutaneous Q24H  . furosemide  40 mg Intravenous Q12H  . metoprolol tartrate  12.5 mg Per Tube BID  . olopatadine  1 drop Both Eyes BID   Continuous Infusions:  Antibiotics Given (last 72 hours)   None      Active Problems:   DM (diabetes mellitus), type 2 with renal complications   Embolism, pulmonary with infarction, hx of   Acute and chronic respiratory failure   Chronic diastolic heart failure   Chronic renal disease, stage 3, moderately decreased glomerular filtration rate between 30-59 mL/min/1.73 square meter   Acute respiratory failure with hypoxia   Atrial fibrillation with rapid ventricular response   COPD (chronic obstructive pulmonary disease)   Tachy-brady syndrome   Dysphagia    Time spent: 35 min     Sharman Garrott  [facility] Pager [phone]. If 7PM-7AM, please contact night-coverage at [email or web address], password [facility] 12/02/13, 9:15 AM  LOS: 1 day

## 2013-12-13 DEATH — deceased

## 2014-03-30 ENCOUNTER — Ambulatory Visit: Payer: Medicare Other | Admitting: Internal Medicine

## 2015-09-10 IMAGING — CT CT CHEST W/O CM
2 of 4 series · 13 of 36 positions shown, 16 images · non-contrast
Comparison: Abdominal pelvic CT 02/05/2013 and 09/05/2008

CLINICAL DATA: Fall. Patient on several toe. Right chest wall pain.

EXAM:
CT CHEST, ABDOMEN AND PELVIS WITHOUT CONTRAST
TECHNIQUE: Multidetector CT imaging of the chest, abdomen and pelvis was
performed following the standard protocol without IV contrast.

[Series 2: cap wo 5.0 i31f 1 · axial · 0.82mm/px · z∈[-870,-280]mm · 10 of 132 slices shown, 13 images]
[im 7/132  mediastinal]
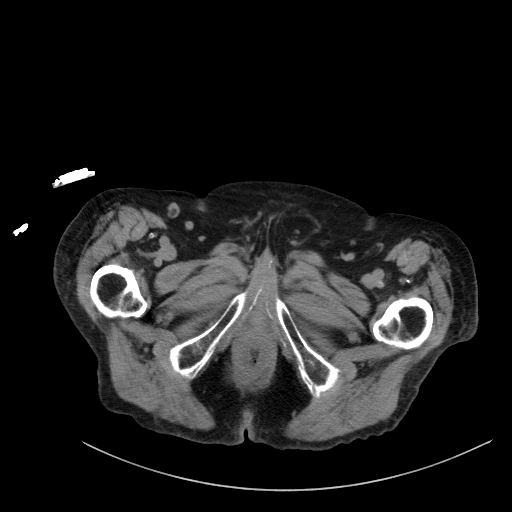
[im 7/132  lung]
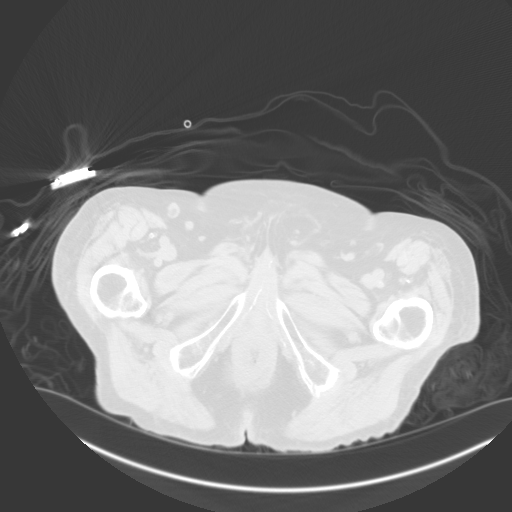
[im 21/132  lung]
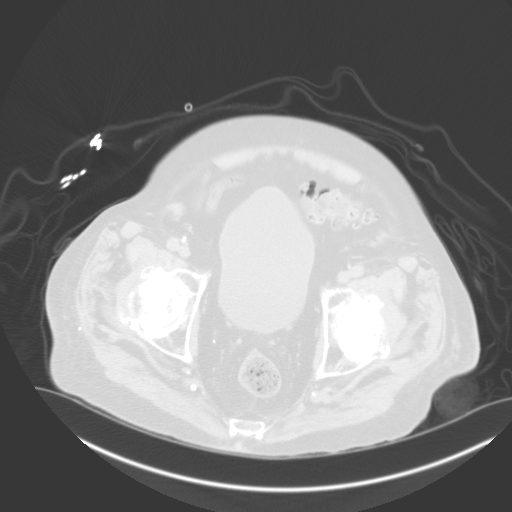
[im 35/132  lung]
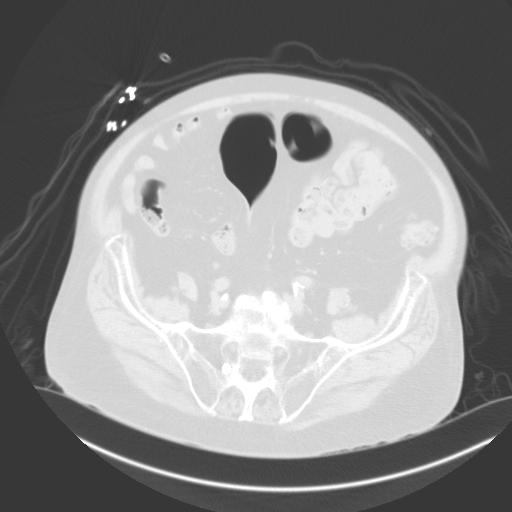
[im 49/132  lung]
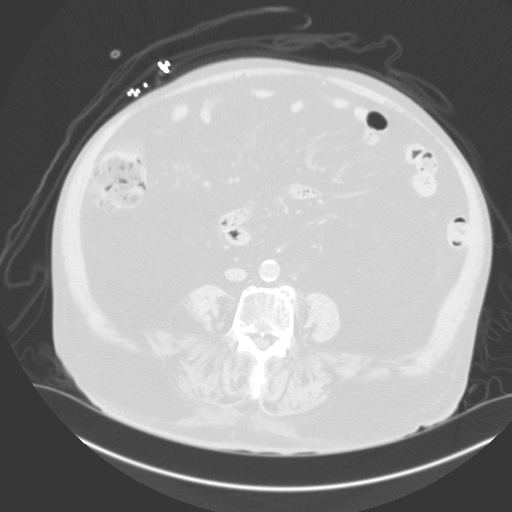
[im 63/132  mediastinal]
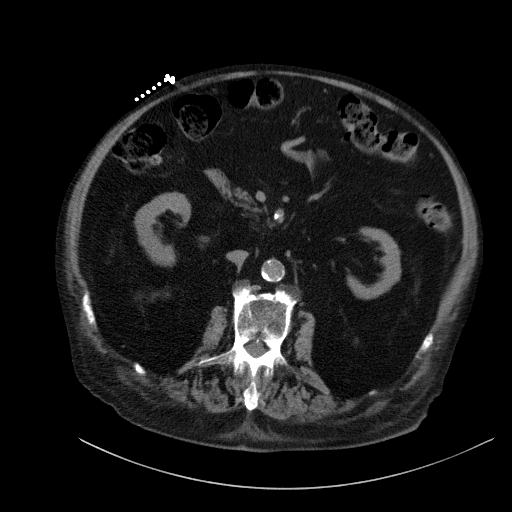
[im 63/132  lung]
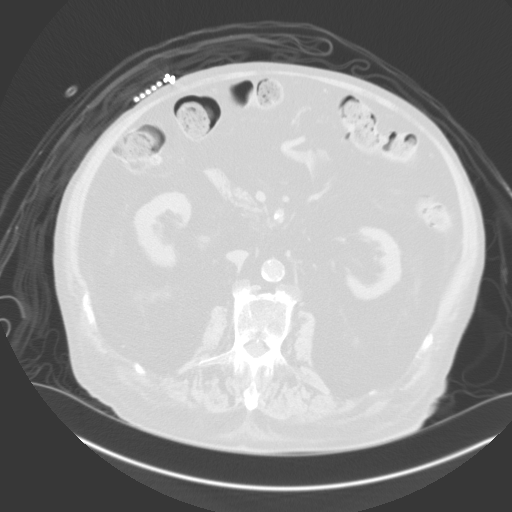
[im 69/132  lung]
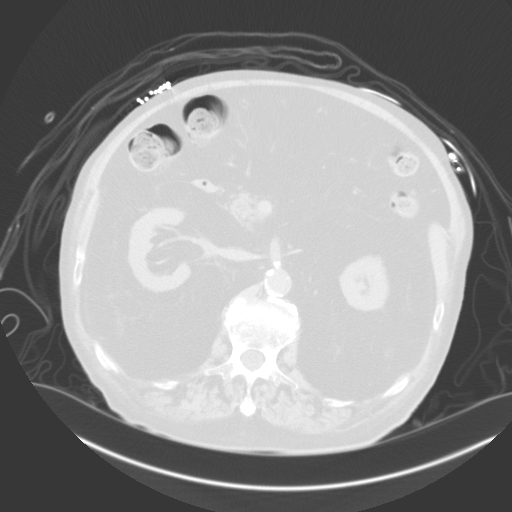
[im 83/132  lung]
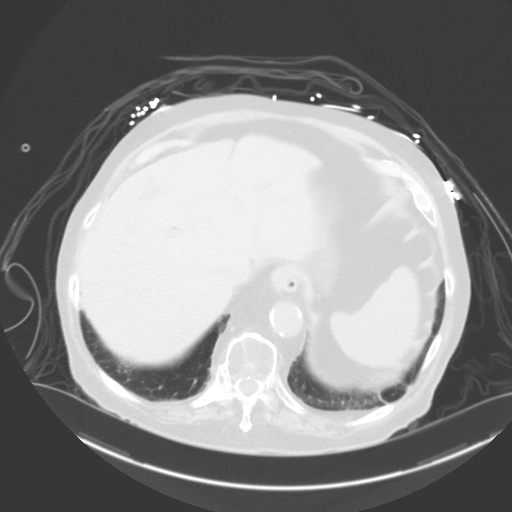
[im 97/132  lung]
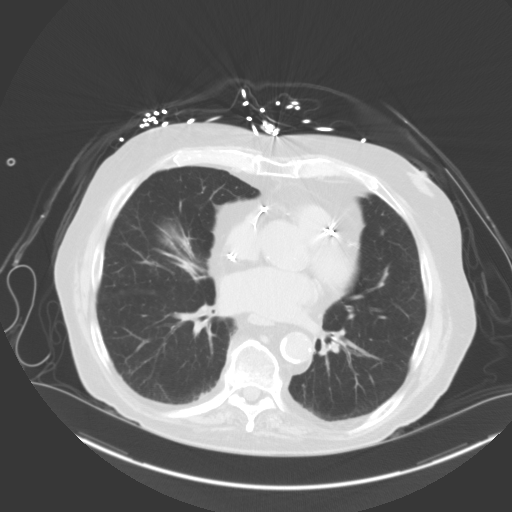
[im 111/132  mediastinal]
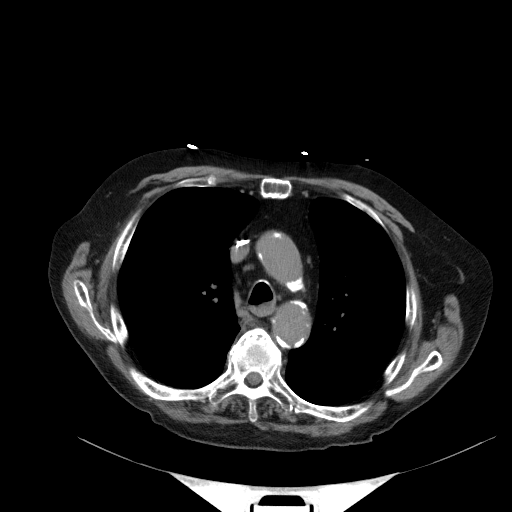
[im 111/132  lung]
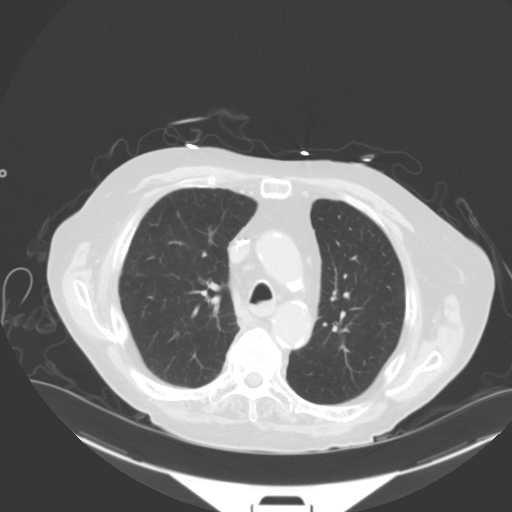
[im 125/132  lung]
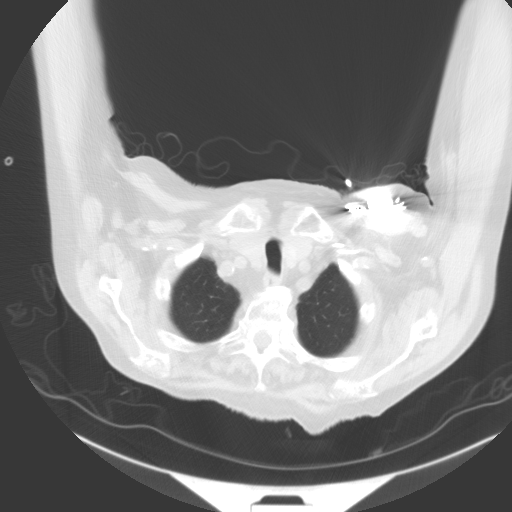

[Series 5: coronal · coronal · 0.78mm/px · 3 of 105 slices shown]
[im 21/105  lung]
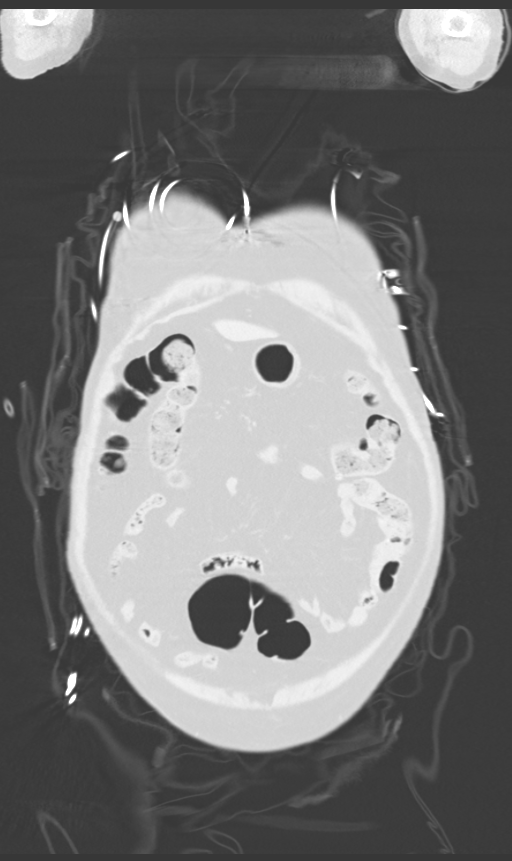
[im 42/105  lung]
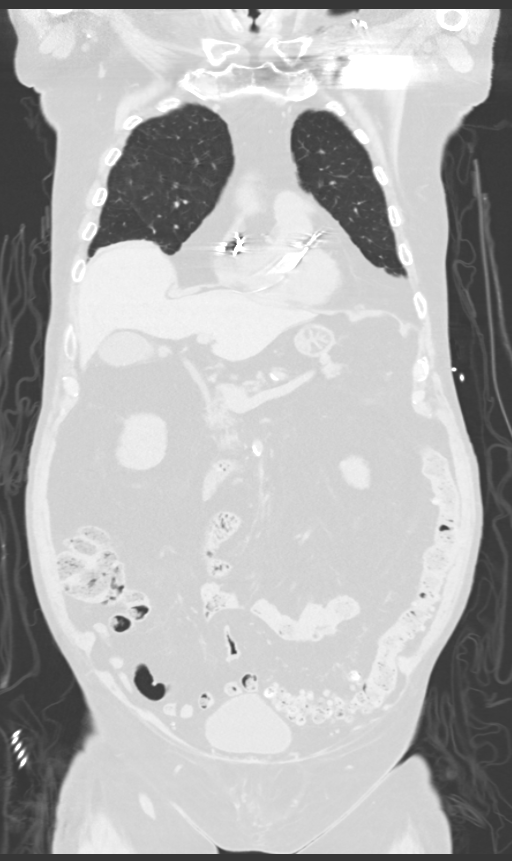
[im 63/105  lung]
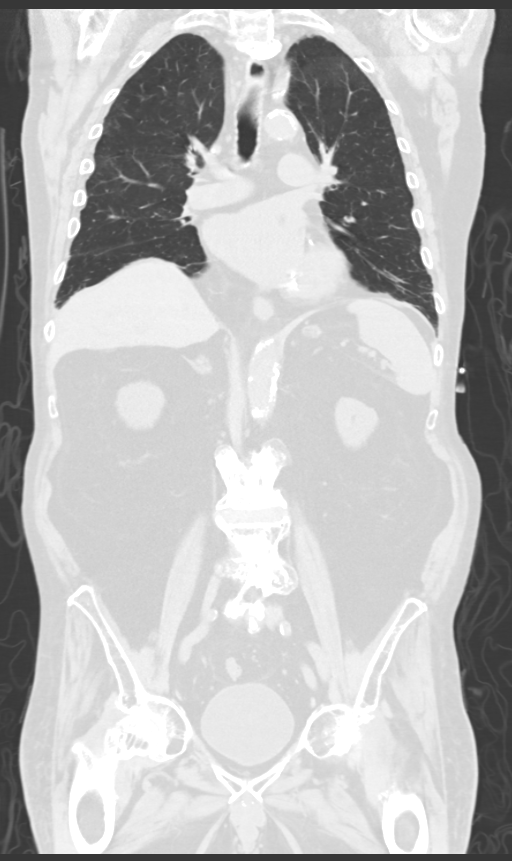

[13 of 36 positions shown; findings below may reference images not displayed]

FINDINGS: CT CHEST FINDINGS

Lungs are well inflated without consolidation or effusion. There is
subtle patchy peripheral increased interstitial markings in a few
small bilateral peripheral nodular opacities with the largest
measuring 4 mm over the left lower lobe.

Left-sided pacemaker is present. Heart is normal in size. There is
calcification in the region of the mitral valve anulus. There is
calcified plaque over the left anterior descending and lateral
circumflex coronary arteries. There is moderate calcified plaque
over the thoracic aorta. There is no significant mediastinal, hilar
or axillary adenopathy. No acute fracture.

CT ABDOMEN AND PELVIS FINDINGS

Single 1.7 cm gallstone. The liver, spleen, pancreas and adrenal
glands are within normal. Kidneys are normal in size without
hydronephrosis or nephrolithiasis. There is no free peritoneal air
or free fluid. There is moderate calcified plaque involving the
abdominal aorta and iliac arteries. There is moderate diverticulosis
of the colon. Appendix is normal.

Pelvic images demonstrate a small left inguinal hernia containing
only peritoneal fat. Bladder, prostate and rectum are unremarkable.
There are moderate degenerative changes of the spine and hips. There
is no acute fracture identified.
IMPRESSION: No acute findings in the chest, abdomen or pelvis.

Minimal patchy increased interstitial markings with a few small
peripheral bilateral pulmonary nodules with the largest measuring 4
mm over the left lower lobe. Recommend follow-up noncontrast chest
CT 1 year.

This recommendation follows the consensus statement: Guidelines for
Management of Small Pulmonary Nodules Detected on CT Scans: A
Statement from the [HOSPITAL] as published in Radiology
4115; [DATE]. Online at:
[URL]

Cholelithiasis.

Atherosclerotic coronary artery disease.

Moderate diverticulosis of the colon.
